# Patient Record
Sex: Male | Born: 1950 | Race: White | Hispanic: No | Marital: Married | Smoking: Never smoker
Health system: Southern US, Community
[De-identification: ages and names within clinical notes are randomized; demographics above are authoritative.]

## PROBLEM LIST (undated history)

## (undated) DIAGNOSIS — E785 Hyperlipidemia, unspecified: Secondary | ICD-10-CM

## (undated) DIAGNOSIS — H409 Unspecified glaucoma: Secondary | ICD-10-CM

## (undated) DIAGNOSIS — Z9189 Other specified personal risk factors, not elsewhere classified: Secondary | ICD-10-CM

## (undated) DIAGNOSIS — I10 Essential (primary) hypertension: Secondary | ICD-10-CM

## (undated) DIAGNOSIS — I2510 Atherosclerotic heart disease of native coronary artery without angina pectoris: Secondary | ICD-10-CM

## (undated) DIAGNOSIS — N189 Chronic kidney disease, unspecified: Secondary | ICD-10-CM

## (undated) DIAGNOSIS — C44301 Unspecified malignant neoplasm of skin of nose: Secondary | ICD-10-CM

## (undated) DIAGNOSIS — Z87442 Personal history of urinary calculi: Secondary | ICD-10-CM

## (undated) DIAGNOSIS — H269 Unspecified cataract: Secondary | ICD-10-CM

## (undated) DIAGNOSIS — N200 Calculus of kidney: Secondary | ICD-10-CM

## (undated) HISTORY — PX: MOHS SURGERY: SUR867

## (undated) HISTORY — DX: Essential (primary) hypertension: I10

## (undated) HISTORY — PX: OTHER SURGICAL HISTORY: SHX169

## (undated) HISTORY — DX: Calculus of kidney: N20.0

## (undated) HISTORY — DX: Chronic kidney disease, unspecified: N18.9

## (undated) HISTORY — PX: LITHOTRIPSY: SUR834

## (undated) HISTORY — DX: Unspecified glaucoma: H40.9

## (undated) HISTORY — DX: Unspecified malignant neoplasm of skin of nose: C44.301

## (undated) HISTORY — PX: TONSILLECTOMY: SHX5217

## (undated) HISTORY — DX: Atherosclerotic heart disease of native coronary artery without angina pectoris: I25.10

## (undated) HISTORY — PX: BASAL CELL CARCINOMA EXCISION: SHX1214

## (undated) HISTORY — DX: Hyperlipidemia, unspecified: E78.5

## (undated) HISTORY — DX: Unspecified cataract: H26.9

## (undated) HISTORY — DX: Other specified personal risk factors, not elsewhere classified: Z91.89

## (undated) HISTORY — PX: ANKLE SURGERY: SHX546

## (undated) HISTORY — DX: Personal history of urinary calculi: Z87.442

## (undated) HISTORY — PX: LAMINECTOMY: SHX219

---

## 1999-12-28 ENCOUNTER — Emergency Department (HOSPITAL_COMMUNITY): Admission: EM | Admit: 1999-12-28 | Discharge: 1999-12-28 | Payer: Self-pay | Admitting: Emergency Medicine

## 1999-12-28 ENCOUNTER — Encounter: Payer: Self-pay | Admitting: Emergency Medicine

## 2000-07-20 ENCOUNTER — Encounter: Admission: RE | Admit: 2000-07-20 | Discharge: 2000-07-20 | Payer: Self-pay | Admitting: Urology

## 2000-07-20 ENCOUNTER — Encounter: Payer: Self-pay | Admitting: Urology

## 2001-01-12 ENCOUNTER — Encounter: Admission: RE | Admit: 2001-01-12 | Discharge: 2001-01-12 | Payer: Self-pay | Admitting: Urology

## 2001-01-12 ENCOUNTER — Encounter: Payer: Self-pay | Admitting: Urology

## 2001-04-01 ENCOUNTER — Encounter: Payer: Self-pay | Admitting: Urology

## 2001-04-01 ENCOUNTER — Encounter: Admission: RE | Admit: 2001-04-01 | Discharge: 2001-04-01 | Payer: Self-pay | Admitting: Urology

## 2001-06-21 ENCOUNTER — Encounter: Admission: RE | Admit: 2001-06-21 | Discharge: 2001-06-21 | Payer: Self-pay | Admitting: Urology

## 2001-06-21 ENCOUNTER — Encounter: Payer: Self-pay | Admitting: Urology

## 2001-06-28 ENCOUNTER — Encounter: Payer: Self-pay | Admitting: Urology

## 2001-06-28 ENCOUNTER — Encounter: Admission: RE | Admit: 2001-06-28 | Discharge: 2001-06-28 | Payer: Self-pay | Admitting: Urology

## 2002-06-20 ENCOUNTER — Encounter: Admission: RE | Admit: 2002-06-20 | Discharge: 2002-09-18 | Payer: Self-pay | Admitting: Orthopedic Surgery

## 2002-09-19 ENCOUNTER — Encounter: Admission: RE | Admit: 2002-09-19 | Discharge: 2002-09-27 | Payer: Self-pay | Admitting: Orthopedic Surgery

## 2003-10-09 ENCOUNTER — Emergency Department (HOSPITAL_COMMUNITY): Admission: AD | Admit: 2003-10-09 | Discharge: 2003-10-09 | Payer: Self-pay | Admitting: Emergency Medicine

## 2003-11-07 ENCOUNTER — Emergency Department (HOSPITAL_COMMUNITY): Admission: EM | Admit: 2003-11-07 | Discharge: 2003-11-08 | Payer: Self-pay | Admitting: Emergency Medicine

## 2003-11-07 IMAGING — CT CT ABDOMEN W/O CM
1 of 2 series · 15 of 32 positions shown, 19 images · non-contrast
Comparison: none

CLINICAL DATA: kidney stones, left flank pain
CT ABDOMEN AND PELVIS WITHOUT CONTRAST
Multidetector helical CT imaging of the abdomen and pelvis performed using kidney stone protocol.  Neither oral nor intravenous contrast utilized for this indication.  Patient?s prior exams are at [REDACTED] and are unavailable for comparison at this time.  
CT ABDOMEN

[Series 3: — · axial · 0.76mm/px · z∈[+880,+1248]mm · 15 of 104 slices shown, 19 images]
[im 8/104  soft-tissue]
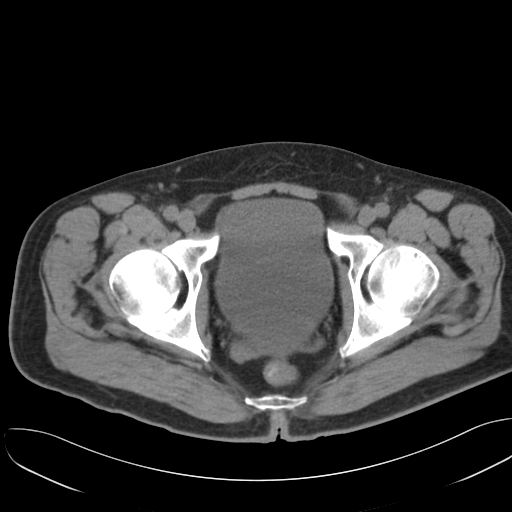
[im 8/104  bone]
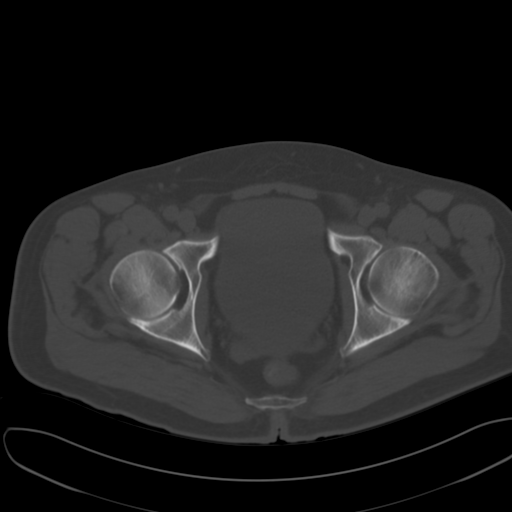
[im 15/104  soft-tissue]
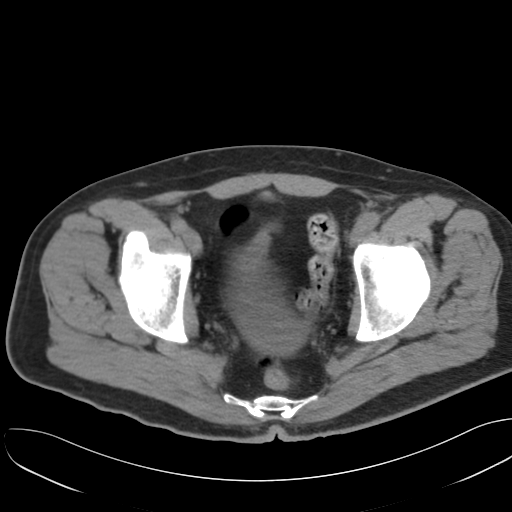
[im 23/104  soft-tissue]
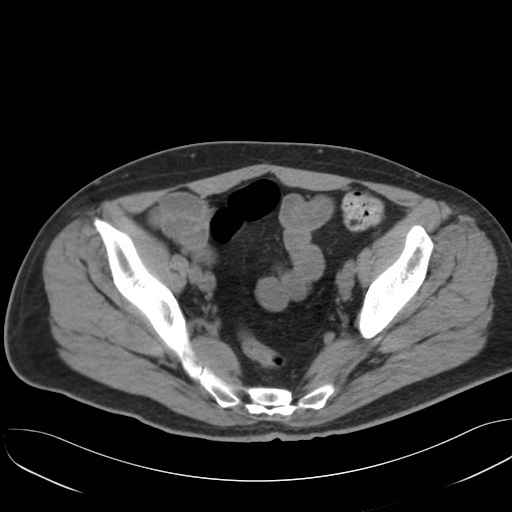
[im 30/104  soft-tissue]
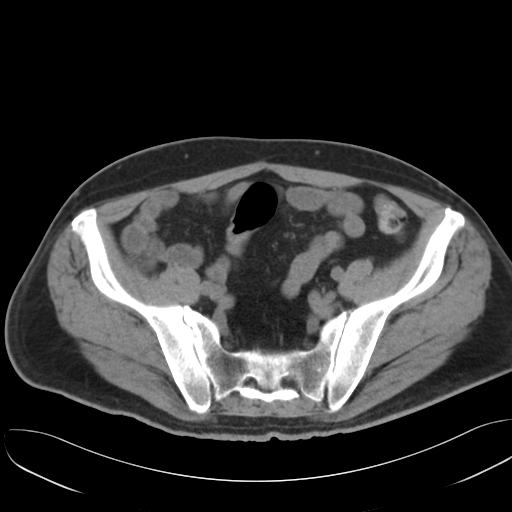
[im 37/104  soft-tissue]
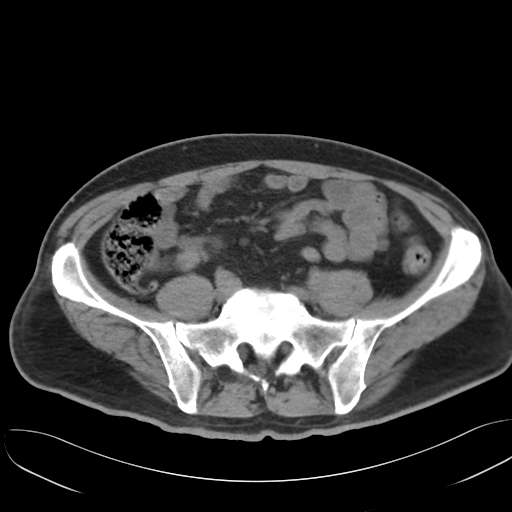
[im 45/104  soft-tissue]
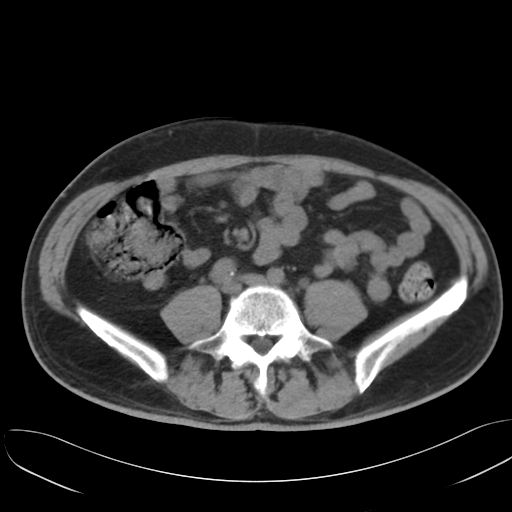
[im 52/104  soft-tissue]
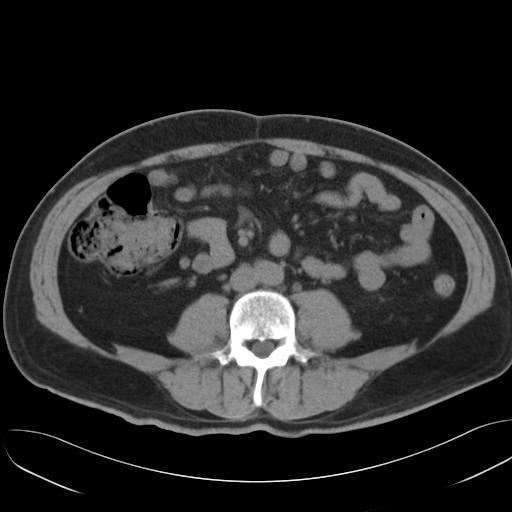
[im 59/104  soft-tissue]
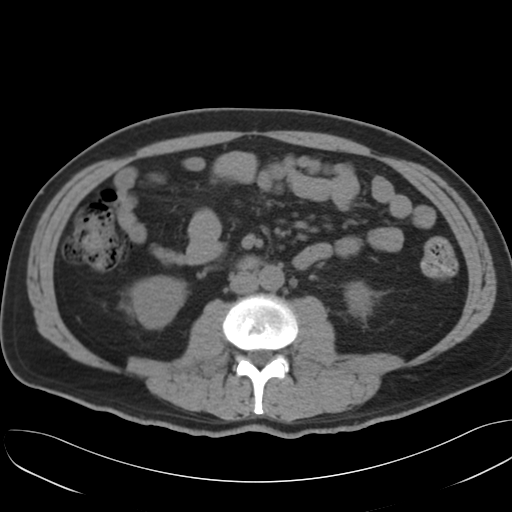
[im 67/104  soft-tissue]
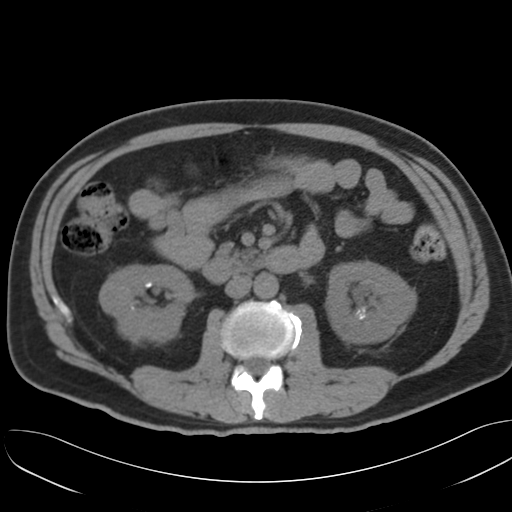
[im 67/104  bone]
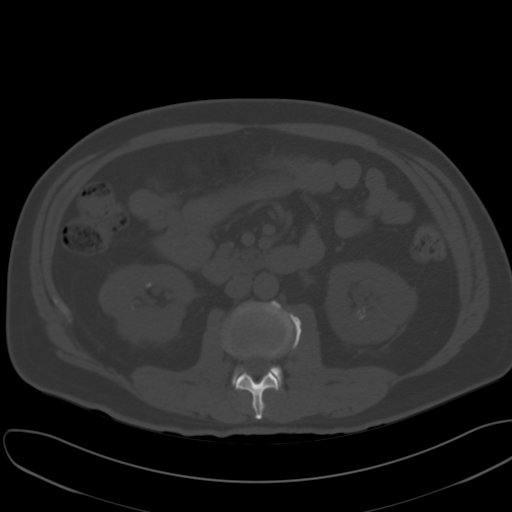
[im 74/104  soft-tissue]
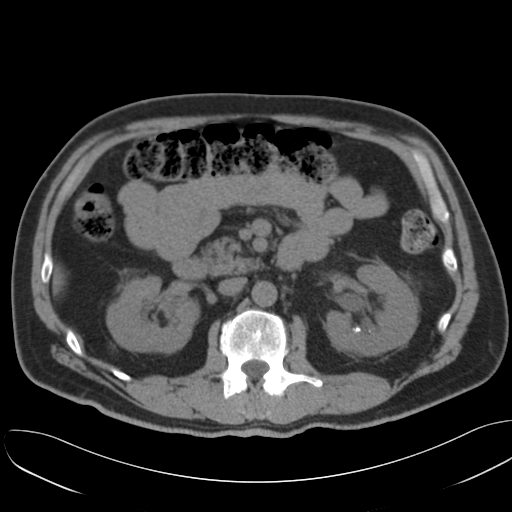
[im 81/104  soft-tissue]
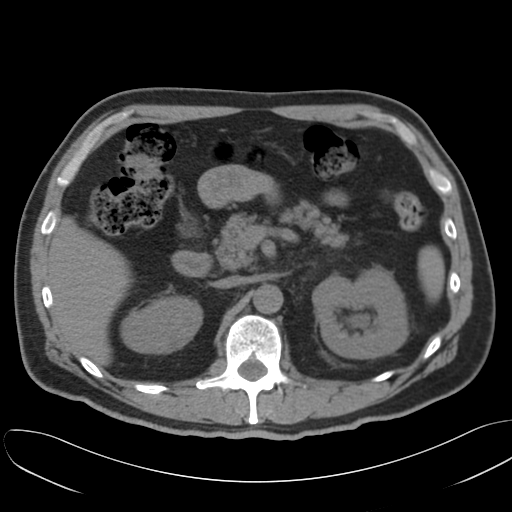
[im 89/104  soft-tissue]
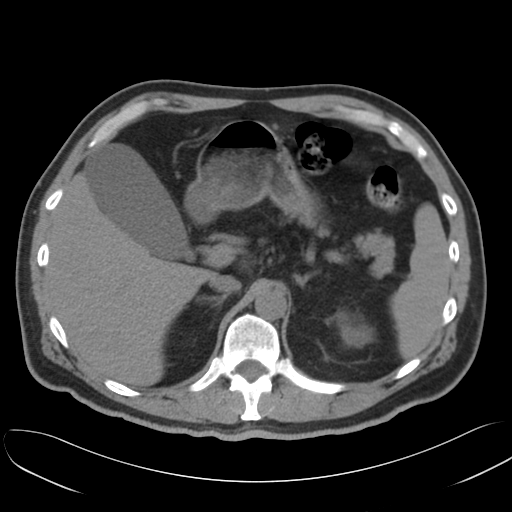
[im 89/104  lung]
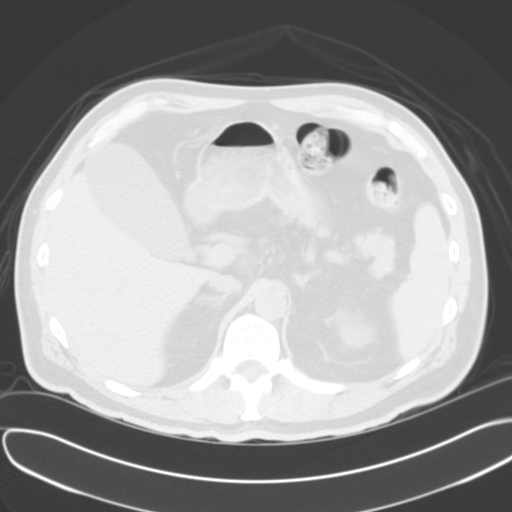
[im 92/104  lung]
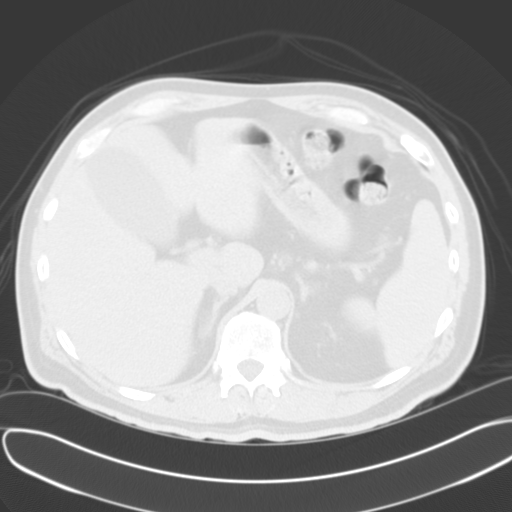
[im 96/104  soft-tissue]
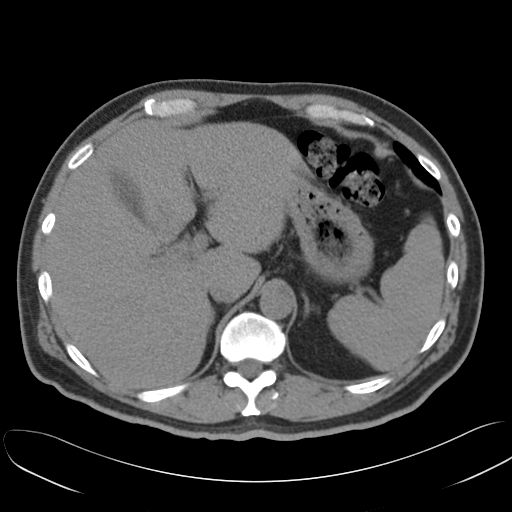
[im 96/104  lung]
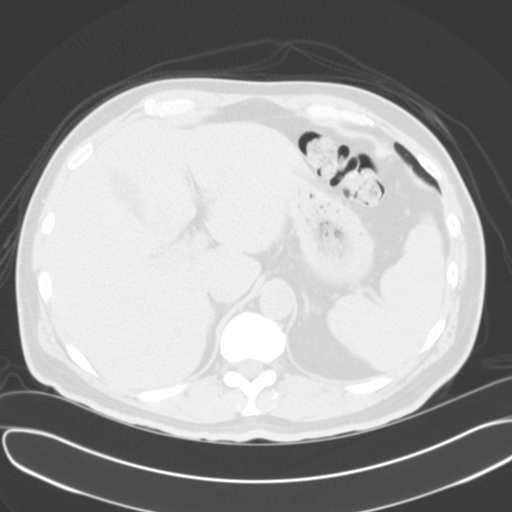
[im 100/104  lung]
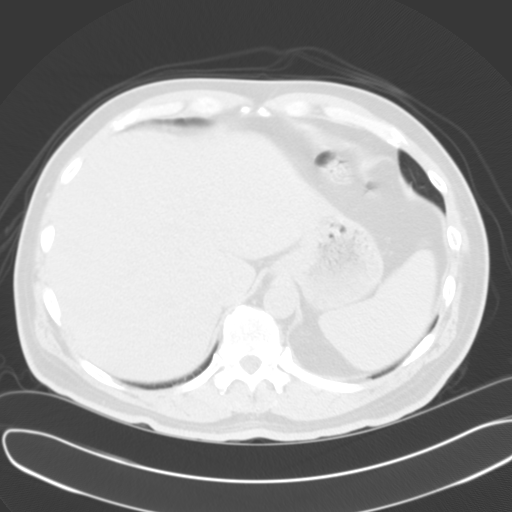

[15 of 32 positions shown; findings below may reference images not displayed]

FINDINGS: Bilateral renal calculi.  Mild left hydronephrosis and ureteral dilatation extending into pelvis.  No right hydronephrosis or ureteral dilatation seen.  Visualized bowel loops and remaining solid organs unremarkable for an exam lacking IV and oral contrast.  Mild perinephric stranding is seen at both kidneys slightly greater on the left.  
IMPRESSION
Numerous bilateral renal calculi.  Mild left hydronephrosis and hydroureter, see below.
CT PELVIS
5mm diameter calculi midleft ureter (image # 70) causing proximal hydronephrosis and ureteral dilatation.  Distal left ureter decompressed and no additional calcification is identified.  Right ureter is not dilated.  However just above the right ureterovesical junction, two small calcifications are seen which appear to be either in or immediately adjacent to the distal right ureter and nonobstructive distal right ureteral calculi not excluded.  Bladder wall distended and unremarkable.  No pelvic free fluid mass or adenopathy.  Prostate gland upper normal-sized.  Normal appendix.  
IMPRESSION
5mm diameter midleft ureteral calculus causing proximal hydronephrosis.
Question two tiny nonobstructive calculi in distal right ureter versus adjacent phleboliths.

## 2003-11-08 ENCOUNTER — Observation Stay (HOSPITAL_COMMUNITY): Admission: RE | Admit: 2003-11-08 | Discharge: 2003-11-09 | Payer: Self-pay | Admitting: Urology

## 2003-11-10 ENCOUNTER — Ambulatory Visit (HOSPITAL_COMMUNITY): Admission: EM | Admit: 2003-11-10 | Discharge: 2003-11-10 | Payer: Self-pay | Admitting: Emergency Medicine

## 2003-11-10 ENCOUNTER — Emergency Department (HOSPITAL_COMMUNITY): Admission: EM | Admit: 2003-11-10 | Discharge: 2003-11-10 | Payer: Self-pay | Admitting: Emergency Medicine

## 2003-11-10 IMAGING — CT CT PELVIS W/O CM
1 series · 15 of 32 positions shown, 19 images · non-contrast
Comparison: [DATE].

CLINICAL DATA: Left flank pain.
CT SCAN OF THE ABDOMEN AND PELVIS WITHOUT INTRAVENOUS OR ORAL CONTRAST UTILIZING RENAL STONE PROTOCOL

[Series 3: — · axial · 0.78mm/px · z∈[+802,+1194]mm · 15 of 109 slices shown, 19 images]
[im 7/109  soft-tissue]
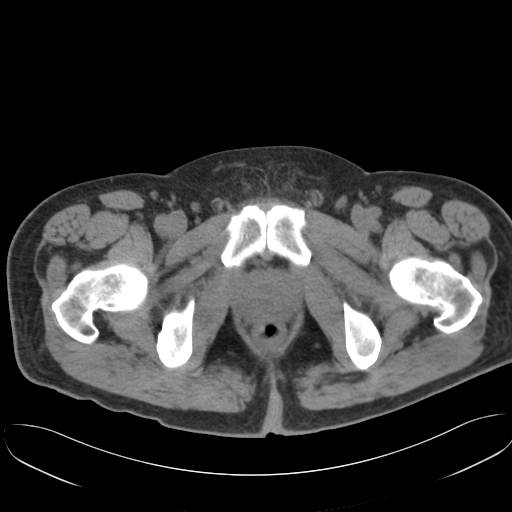
[im 7/109  bone]
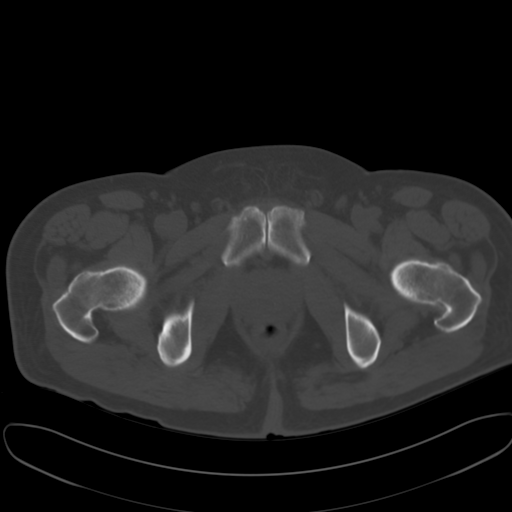
[im 14/109  soft-tissue]
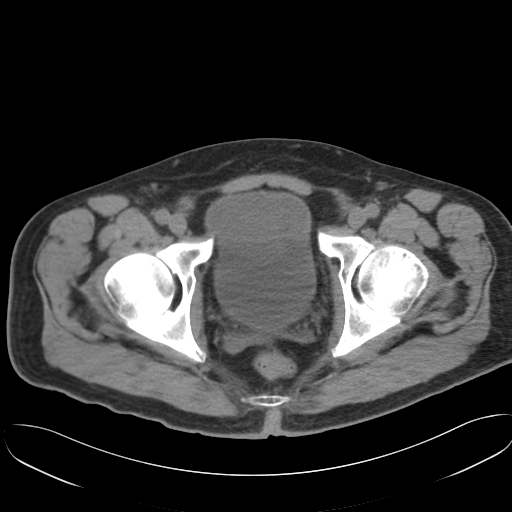
[im 21/109  soft-tissue]
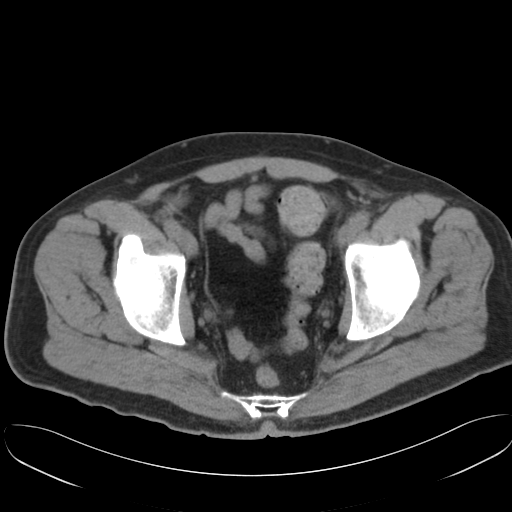
[im 32/109  soft-tissue]
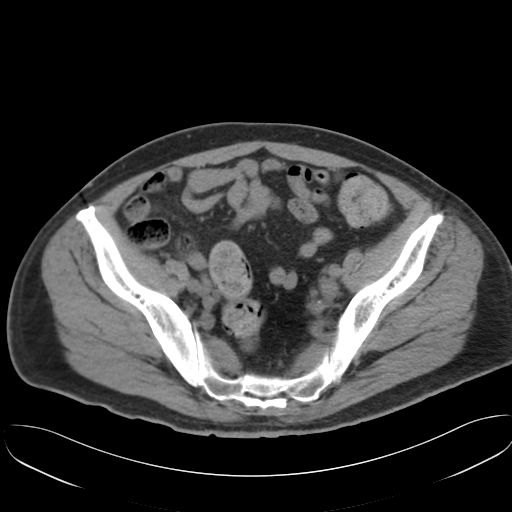
[im 39/109  soft-tissue]
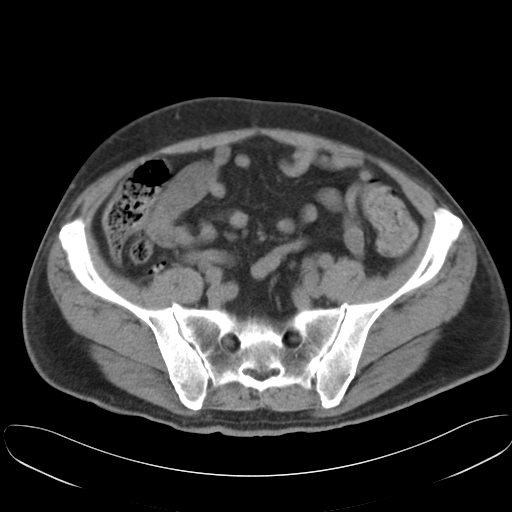
[im 46/109  soft-tissue]
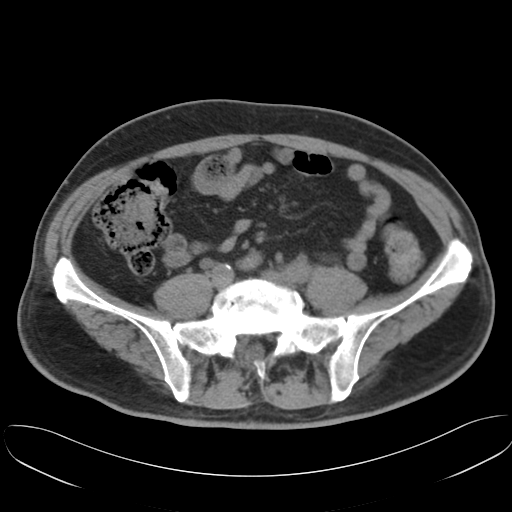
[im 56/109  soft-tissue]
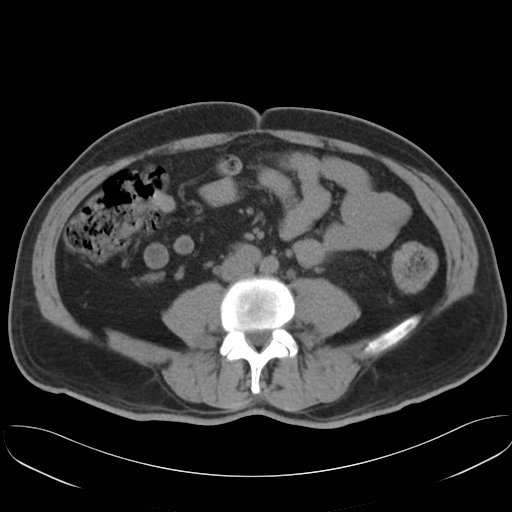
[im 63/109  soft-tissue]
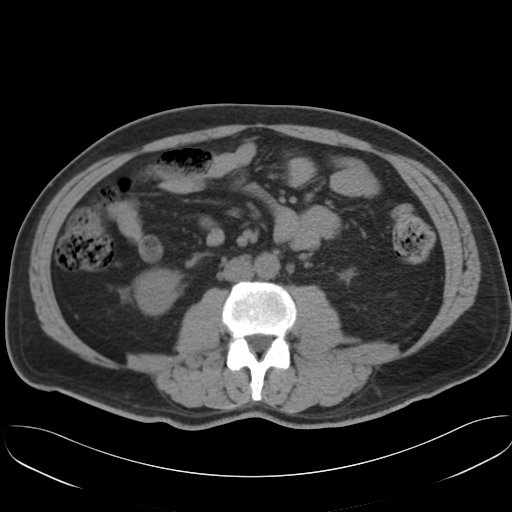
[im 70/109  soft-tissue]
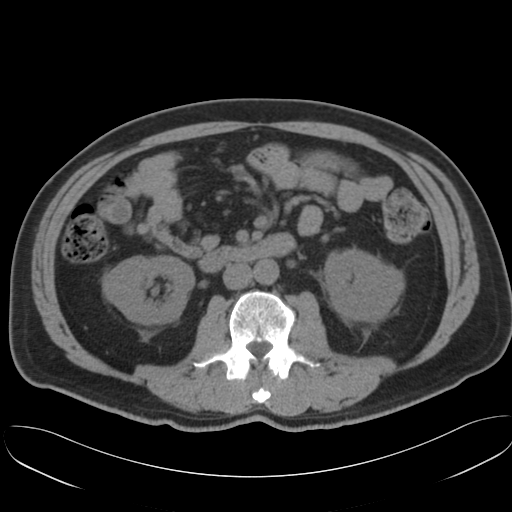
[im 70/109  bone]
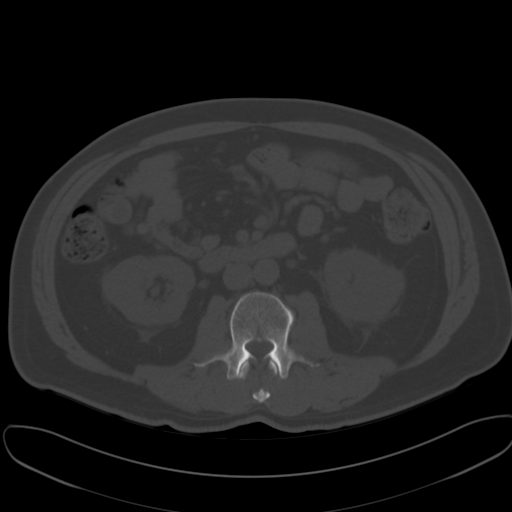
[im 77/109  soft-tissue]
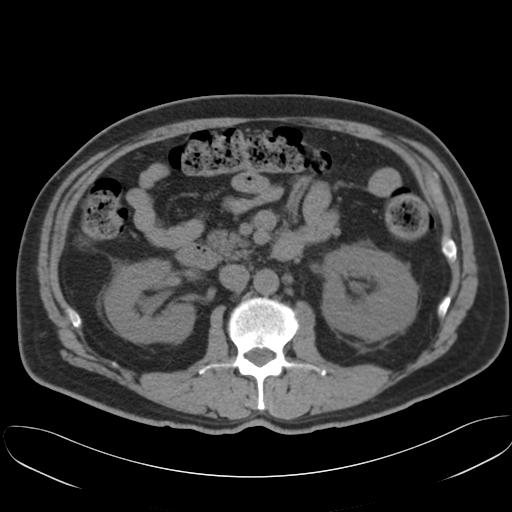
[im 88/109  soft-tissue]
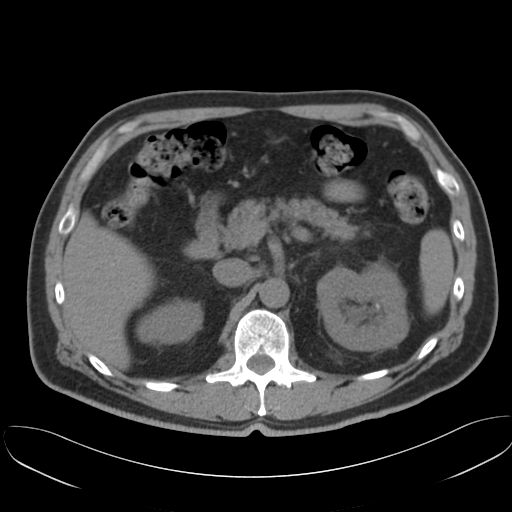
[im 95/109  soft-tissue]
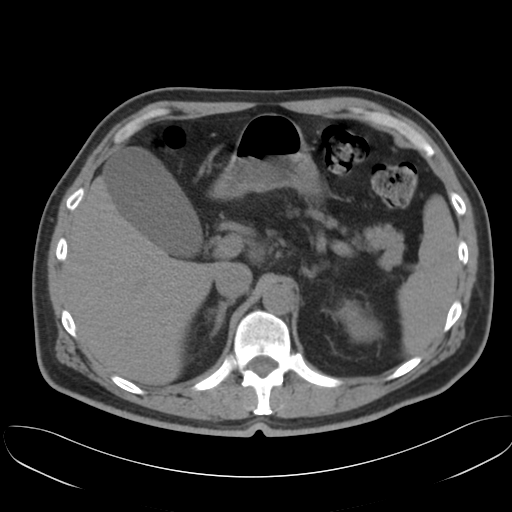
[im 95/109  lung]
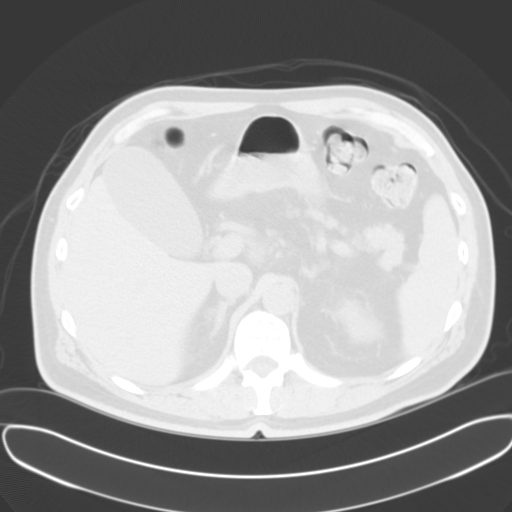
[im 98/109  lung]
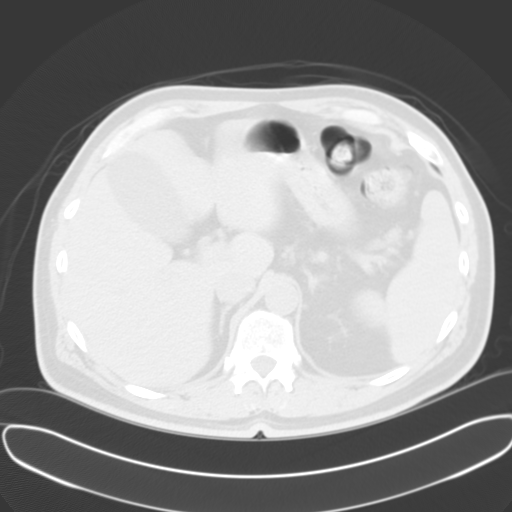
[im 102/109  soft-tissue]
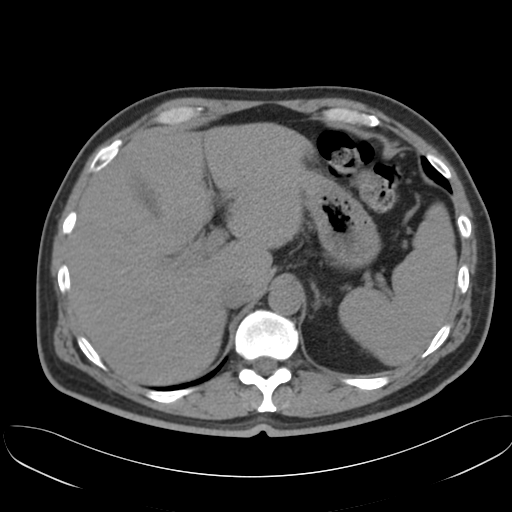
[im 102/109  lung]
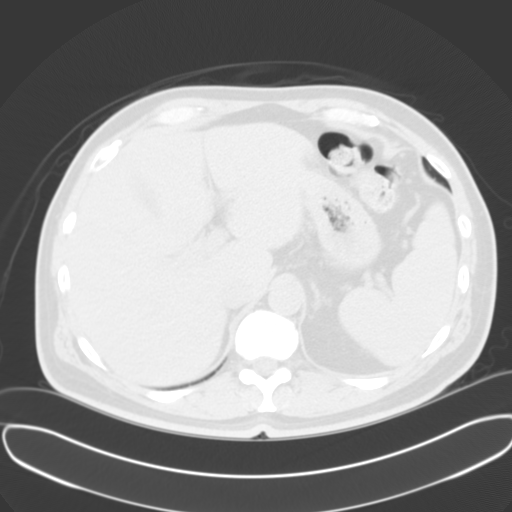
[im 105/109  lung]
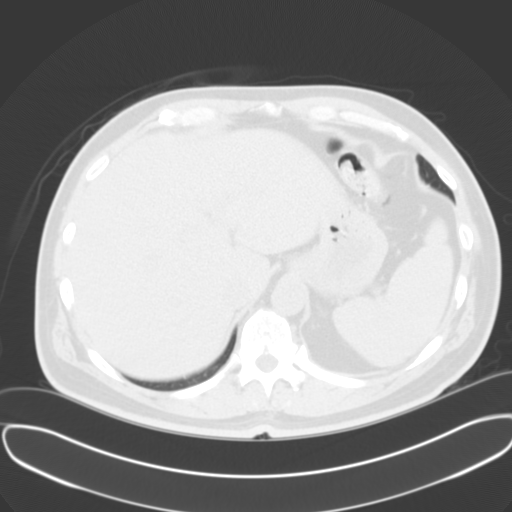

[15 of 32 positions shown; findings below may reference images not displayed]

FINDINGS: CT ABDOMEN WITHOUT CONTRAST 
Bilateral renal calculi.  Left-sided hydroureteronephrosis.  Please see below.  Unenhanced visualized portions of the liver, spleen, pancreas, and adrenal glands unremarkable.  Gallbladder is distended, but without CT evidence of inflammation.  Gallstones cannot be excluded by CT.  Mild degenerative changes thoracolumbar spine.  
IMPRESSION
Persistent left-sided hydroureteronephrosis.  Bilateral renal calculi noted.  Please see below.
CT PELVIS WITHOUT CONTRAST 
Mild atherosclerotic type changes in common iliac arteries.  Previously, the patient was noted to have a 5 x 5 mm distal left ureteral calculus at the level of the upper sacrum.  This has either passed or fragmented.  Now noted are two smaller left ureteral calculi located slightly distal within the distal left ureter measuring 2 x 3 mm and 4 x 3 mm.  Once again noted are distal right ureteral non-obstructing calculi.  Prostate gland is prominent in size.  Degenerative changes lumbar spine.  
IMPRESSION
Change in appearance of distal left ureteral calculi.  Previously a 5 x 5 mm calculus was noted.  This has either passed or fragmented, and now noted are two smaller distal left ureteral calculi measuring 2 x 3 mm and 3 x 4 mm.  Remainder of findings without contrast as noted above.

## 2003-11-17 ENCOUNTER — Emergency Department (HOSPITAL_COMMUNITY): Admission: EM | Admit: 2003-11-17 | Discharge: 2003-11-18 | Payer: Self-pay | Admitting: Emergency Medicine

## 2006-06-16 ENCOUNTER — Emergency Department (HOSPITAL_COMMUNITY): Admission: EM | Admit: 2006-06-16 | Discharge: 2006-06-16 | Payer: Self-pay | Admitting: Emergency Medicine

## 2006-06-16 IMAGING — CT CT ABDOMEN W/O CM
1 series · 15 of 32 positions shown, 19 images · IV contrast (agent unspecified)
Comparison: [DATE].

CLINICAL DATA: Right flank pain.  Question kidney stone.  
ABDOMEN CT WITHOUT CONTRAST:
TECHNIQUE: Multidetector CT imaging of the abdomen was performed following the standard protocol without IV contrast.
TECHNIQUE: Multidetector CT imaging of the pelvis was performed following the standard protocol without IV contrast.

[Series 2: stone_wo 5.0 b40f st · axial · 0.76mm/px · z∈[-479,-63]mm · 15 of 116 slices shown, 19 images]
[im 8/116  soft-tissue]
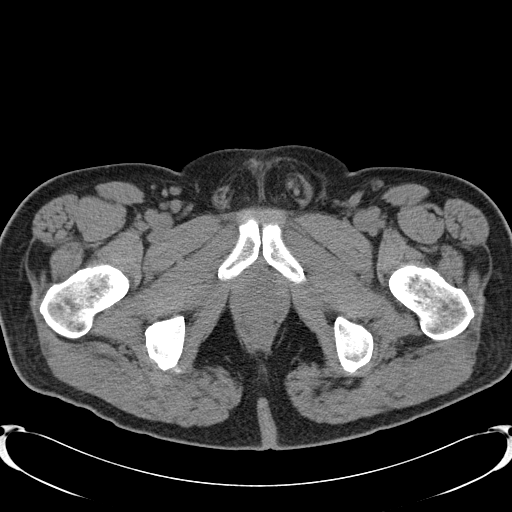
[im 8/116  bone]
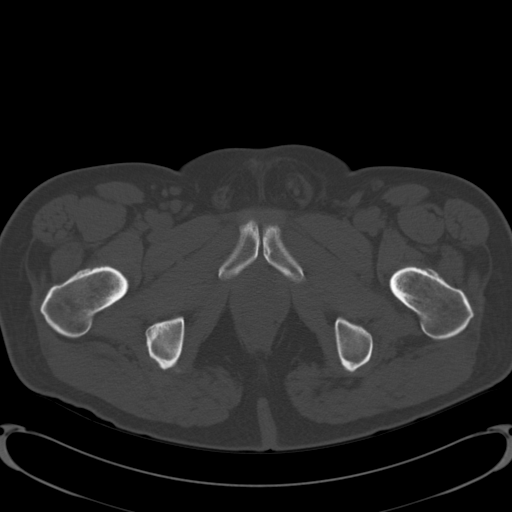
[im 15/116  soft-tissue]
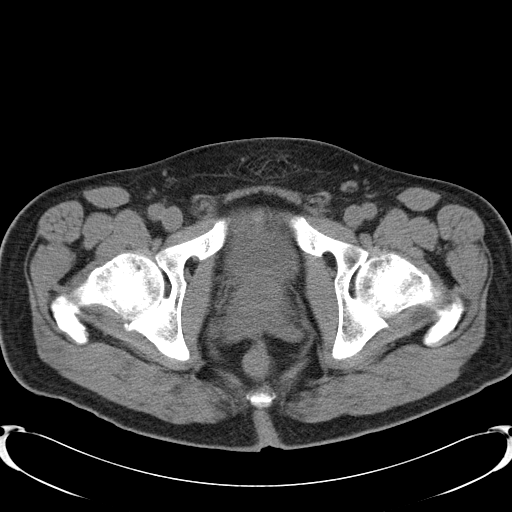
[im 23/116  soft-tissue]
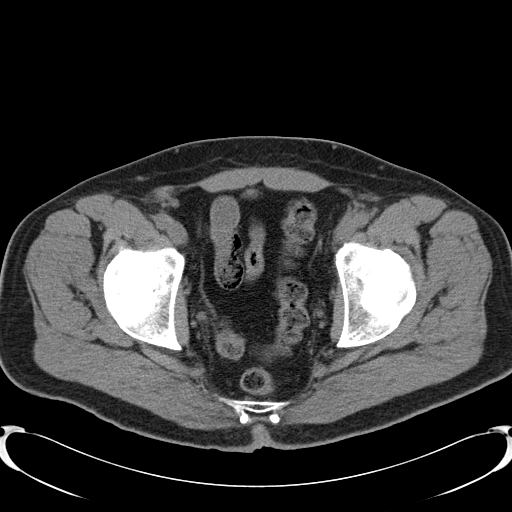
[im 34/116  soft-tissue]
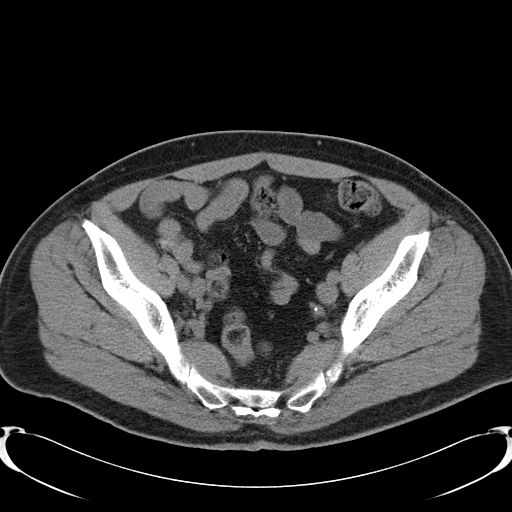
[im 41/116  soft-tissue]
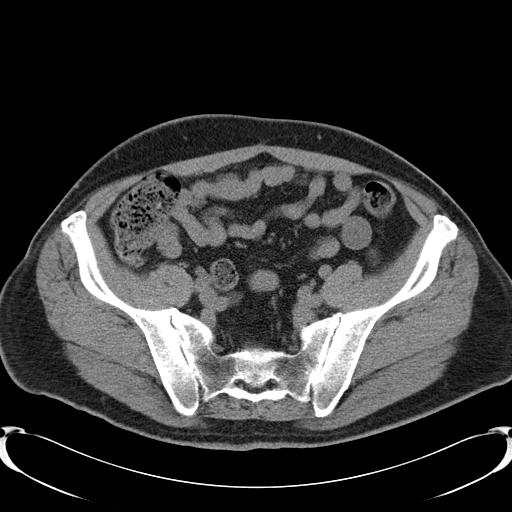
[im 49/116  soft-tissue]
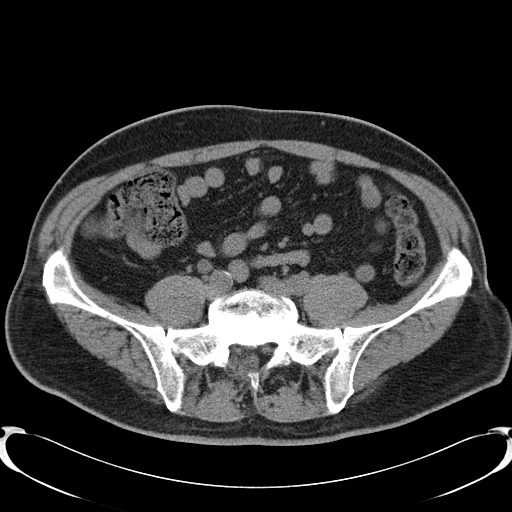
[im 60/116  soft-tissue]
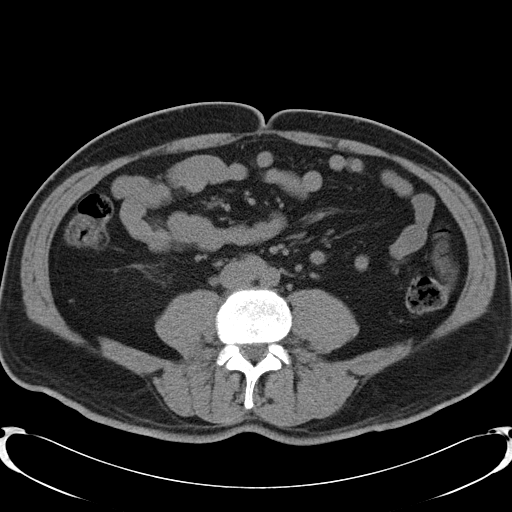
[im 67/116  soft-tissue]
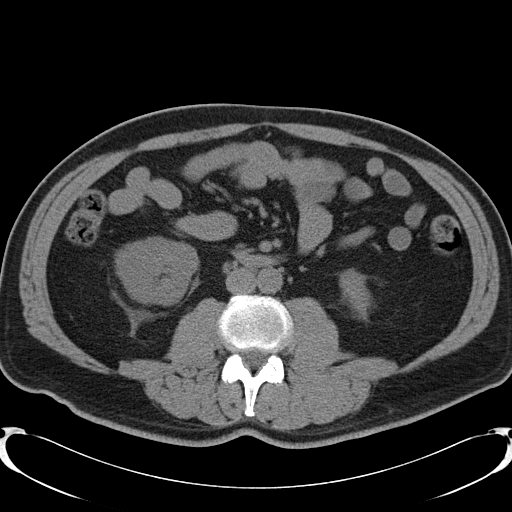
[im 75/116  soft-tissue]
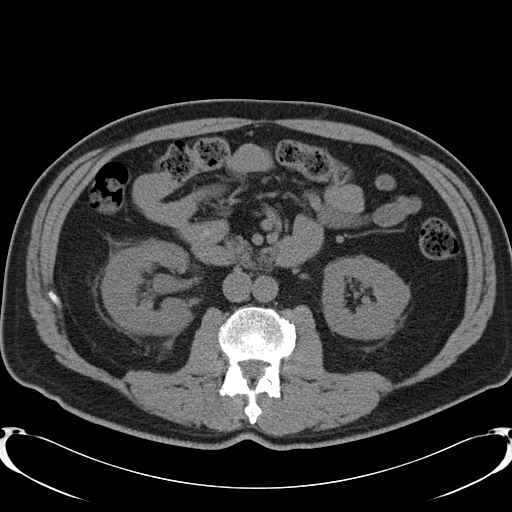
[im 75/116  bone]
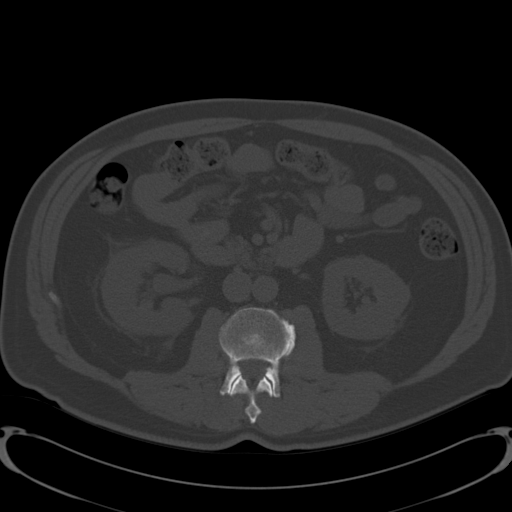
[im 82/116  soft-tissue]
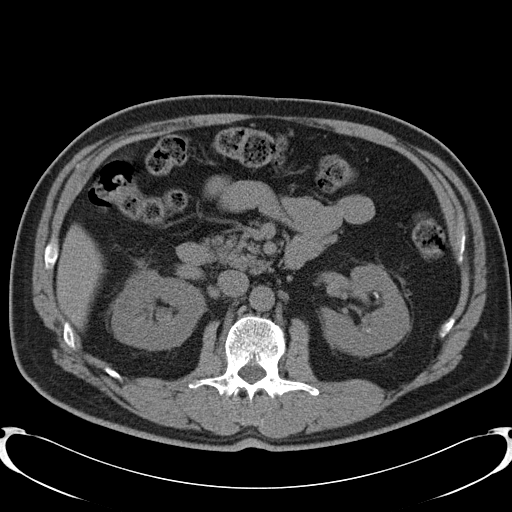
[im 93/116  soft-tissue]
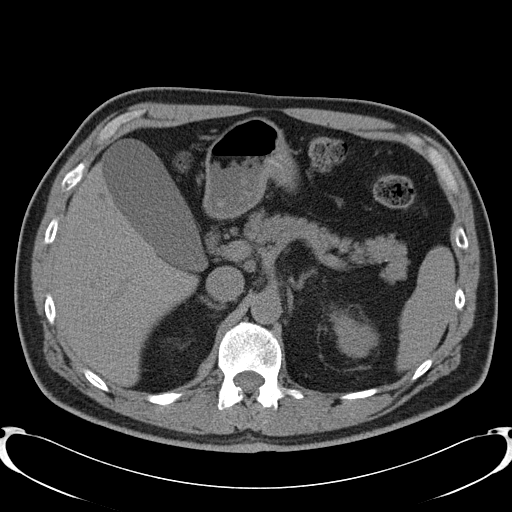
[im 101/116  soft-tissue]
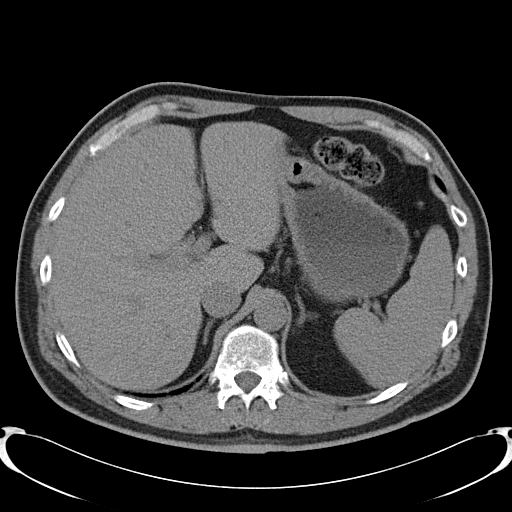
[im 101/116  lung]
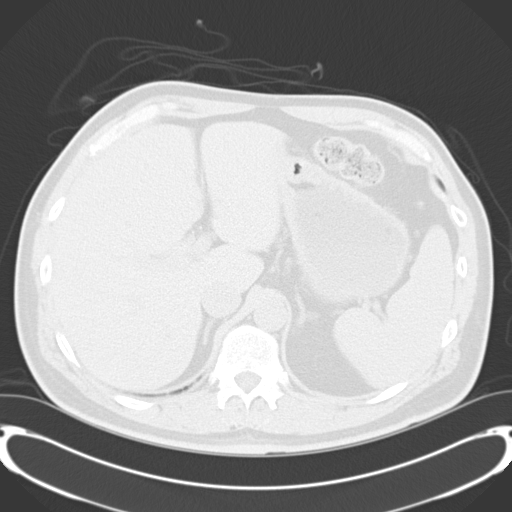
[im 104/116  lung]
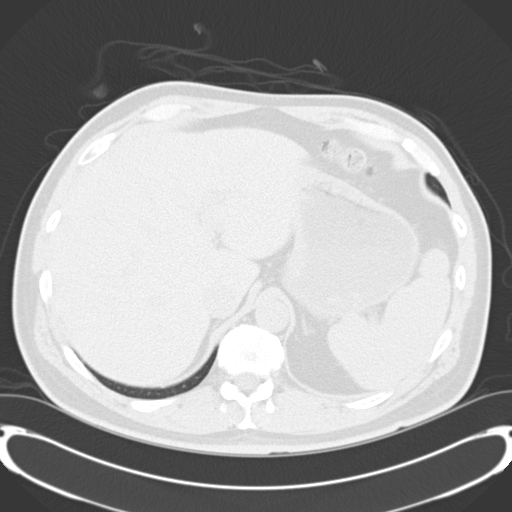
[im 108/116  soft-tissue]
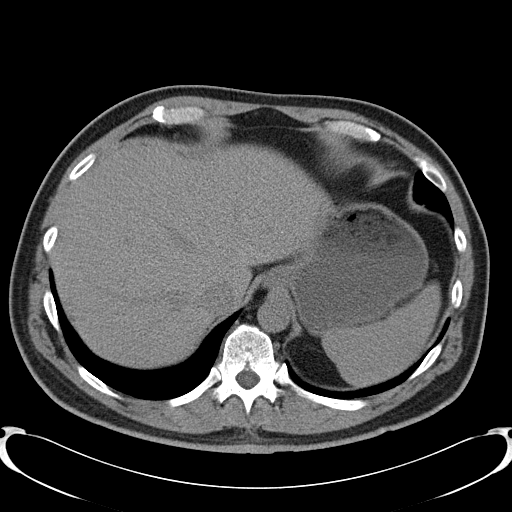
[im 108/116  lung]
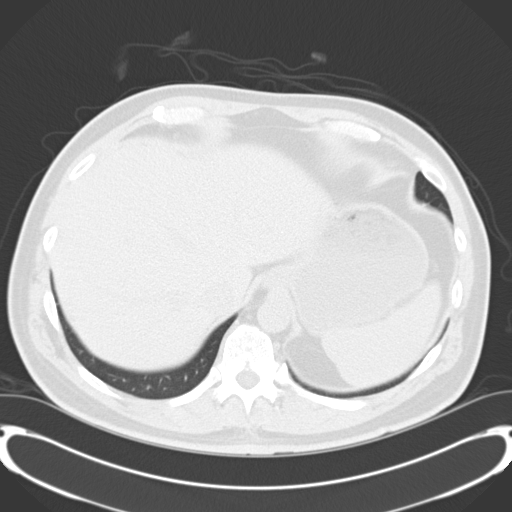
[im 112/116  lung]
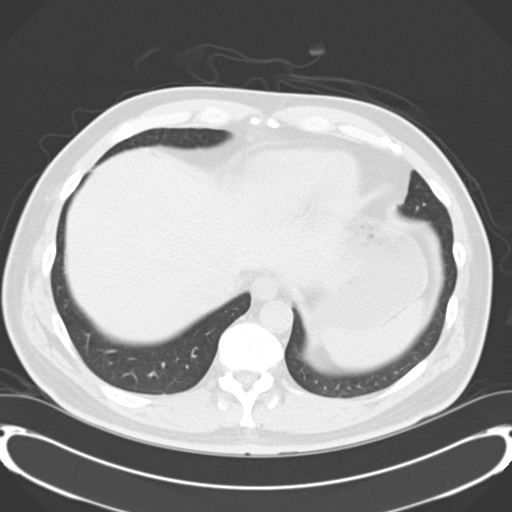

[15 of 32 positions shown; findings below may reference images not displayed]

FINDINGS: Lung bases clear.  Dilated gallbladder without surrounding inflammation.  Unenhanced visualized portions of the liver, spleen, pancreas, and adrenal glands unremarkable.  Multiple bilateral renal calculi.  Fullness of the right collecting system secondary to distal ureteral calculi as discussed below.  No gross renal mass although evaluation is somewhat limited without contrast.  Mild degenerative changes of the lumbar spine.  No abdominal aortic aneurysm.  Limited evaluation of bowel without gross abnormality.
IMPRESSION: Multiple bilateral renal calculi with fullness of the right collecting system.  Please see below.  
PELVIS CT WITHOUT CONTRAST:
FINDINGS: Dilated right ureter to the level of the right ureterovesical junction where there is a 4.4 mm obstructing calculus.  The prostate gland is slightly prominent with scattered calcification.  Mild atherosclerotic type changes proximal common iliac arteries.
IMPRESSION: 4.4 mm distal right ureteral obstructing calculus.  This is located just above the ureterovesical junction.

## 2008-08-30 ENCOUNTER — Ambulatory Visit: Payer: Self-pay | Admitting: Endocrinology

## 2008-08-30 DIAGNOSIS — Z87442 Personal history of urinary calculi: Secondary | ICD-10-CM | POA: Insufficient documentation

## 2008-08-30 DIAGNOSIS — M129 Arthropathy, unspecified: Secondary | ICD-10-CM | POA: Insufficient documentation

## 2008-08-30 DIAGNOSIS — Z9189 Other specified personal risk factors, not elsewhere classified: Secondary | ICD-10-CM

## 2008-08-30 DIAGNOSIS — K219 Gastro-esophageal reflux disease without esophagitis: Secondary | ICD-10-CM | POA: Insufficient documentation

## 2008-08-30 DIAGNOSIS — R32 Unspecified urinary incontinence: Secondary | ICD-10-CM | POA: Insufficient documentation

## 2008-08-30 HISTORY — DX: Other specified personal risk factors, not elsewhere classified: Z91.89

## 2008-08-30 HISTORY — DX: Personal history of urinary calculi: Z87.442

## 2008-09-13 ENCOUNTER — Ambulatory Visit: Payer: Self-pay | Admitting: Endocrinology

## 2008-10-04 ENCOUNTER — Encounter: Payer: Self-pay | Admitting: Endocrinology

## 2008-10-13 ENCOUNTER — Ambulatory Visit: Payer: Self-pay | Admitting: Endocrinology

## 2008-11-24 ENCOUNTER — Ambulatory Visit: Payer: Self-pay | Admitting: Endocrinology

## 2008-11-24 LAB — CONVERTED CEMR LAB: Hgb A1c MFr Bld: 8.9 % — ABNORMAL HIGH (ref 4.6–6.5)

## 2008-12-01 ENCOUNTER — Telehealth (INDEPENDENT_AMBULATORY_CARE_PROVIDER_SITE_OTHER): Payer: Self-pay | Admitting: *Deleted

## 2008-12-26 ENCOUNTER — Ambulatory Visit: Payer: Self-pay | Admitting: Endocrinology

## 2009-02-07 ENCOUNTER — Ambulatory Visit: Payer: Self-pay | Admitting: Endocrinology

## 2009-03-27 ENCOUNTER — Ambulatory Visit: Payer: Self-pay | Admitting: Internal Medicine

## 2009-03-27 DIAGNOSIS — I10 Essential (primary) hypertension: Secondary | ICD-10-CM | POA: Insufficient documentation

## 2009-04-25 ENCOUNTER — Ambulatory Visit: Payer: Self-pay | Admitting: Internal Medicine

## 2009-04-25 LAB — CONVERTED CEMR LAB
ALT: 24 units/L (ref 0–53)
AST: 21 units/L (ref 0–37)
Albumin: 3.9 g/dL (ref 3.5–5.2)
Alkaline Phosphatase: 46 units/L (ref 39–117)
Bilirubin, Direct: 0 mg/dL (ref 0.0–0.3)
Cholesterol: 138 mg/dL (ref 0–200)
HDL: 28.9 mg/dL — ABNORMAL LOW (ref >39.00)
Hgb A1c MFr Bld: 7.8 % — ABNORMAL HIGH (ref 4.6–6.5)
LDL Cholesterol: 91 mg/dL (ref 0–99)
Total Bilirubin: 0.6 mg/dL (ref 0.3–1.2)
Total CHOL/HDL Ratio: 5
Total Protein: 6.7 g/dL (ref 6.0–8.3)
Triglycerides: 90 mg/dL (ref 0.0–149.0)
VLDL: 18 mg/dL (ref 0.0–40.0)

## 2009-05-02 ENCOUNTER — Ambulatory Visit: Payer: Self-pay | Admitting: Internal Medicine

## 2009-05-02 DIAGNOSIS — E786 Lipoprotein deficiency: Secondary | ICD-10-CM

## 2009-05-02 DIAGNOSIS — E785 Hyperlipidemia, unspecified: Secondary | ICD-10-CM | POA: Insufficient documentation

## 2009-05-30 ENCOUNTER — Telehealth: Payer: Self-pay | Admitting: Internal Medicine

## 2009-05-30 ENCOUNTER — Ambulatory Visit: Payer: Self-pay | Admitting: Internal Medicine

## 2009-06-19 ENCOUNTER — Ambulatory Visit: Payer: Self-pay | Admitting: Internal Medicine

## 2009-06-20 ENCOUNTER — Telehealth: Payer: Self-pay | Admitting: Internal Medicine

## 2009-07-03 ENCOUNTER — Ambulatory Visit: Payer: Self-pay | Admitting: Internal Medicine

## 2009-07-03 DIAGNOSIS — M7750 Other enthesopathy of unspecified foot: Secondary | ICD-10-CM | POA: Insufficient documentation

## 2009-07-03 LAB — CONVERTED CEMR LAB
BUN: 12 mg/dL (ref 6–23)
CO2: 30 meq/L (ref 19–32)
Calcium: 9.7 mg/dL (ref 8.4–10.5)
Chloride: 103 meq/L (ref 96–112)
Creatinine, Ser: 0.7 mg/dL (ref 0.4–1.5)
GFR calc non Af Amer: 122.41 mL/min (ref >60)
Glucose, Bld: 79 mg/dL (ref 70–99)
Hgb A1c MFr Bld: 8 % — ABNORMAL HIGH (ref 4.6–6.5)
Potassium: 4.3 meq/L (ref 3.5–5.1)
Sodium: 140 meq/L (ref 135–145)

## 2009-07-17 ENCOUNTER — Telehealth: Payer: Self-pay | Admitting: Internal Medicine

## 2009-07-18 ENCOUNTER — Telehealth: Payer: Self-pay | Admitting: Internal Medicine

## 2009-07-30 ENCOUNTER — Encounter: Admission: RE | Admit: 2009-07-30 | Discharge: 2009-08-02 | Payer: Self-pay | Admitting: Orthopedic Surgery

## 2009-08-06 ENCOUNTER — Encounter: Admission: RE | Admit: 2009-08-06 | Discharge: 2009-11-04 | Payer: Self-pay | Admitting: Orthopedic Surgery

## 2009-08-17 ENCOUNTER — Telehealth: Payer: Self-pay | Admitting: Internal Medicine

## 2009-09-04 ENCOUNTER — Ambulatory Visit: Payer: Self-pay | Admitting: Internal Medicine

## 2009-09-04 DIAGNOSIS — M19049 Primary osteoarthritis, unspecified hand: Secondary | ICD-10-CM | POA: Insufficient documentation

## 2009-09-04 LAB — CONVERTED CEMR LAB
BUN: 15 mg/dL (ref 6–23)
CO2: 30 meq/L (ref 19–32)
Calcium: 9.6 mg/dL (ref 8.4–10.5)
Chloride: 104 meq/L (ref 96–112)
Creatinine, Ser: 0.8 mg/dL (ref 0.4–1.5)
GFR calc non Af Amer: 104.87 mL/min (ref >60)
Glucose, Bld: 113 mg/dL — ABNORMAL HIGH (ref 70–99)
Hgb A1c MFr Bld: 8.1 % — ABNORMAL HIGH (ref 4.6–6.5)
Potassium: 4.6 meq/L (ref 3.5–5.1)
Sodium: 139 meq/L (ref 135–145)

## 2009-09-05 ENCOUNTER — Telehealth: Payer: Self-pay | Admitting: Internal Medicine

## 2009-11-08 ENCOUNTER — Encounter: Payer: Self-pay | Admitting: Internal Medicine

## 2009-11-16 ENCOUNTER — Telehealth: Payer: Self-pay | Admitting: Internal Medicine

## 2009-12-03 ENCOUNTER — Ambulatory Visit: Payer: Self-pay | Admitting: Internal Medicine

## 2009-12-03 DIAGNOSIS — M549 Dorsalgia, unspecified: Secondary | ICD-10-CM | POA: Insufficient documentation

## 2009-12-03 LAB — CONVERTED CEMR LAB
BUN: 13 mg/dL (ref 6–23)
CO2: 33 meq/L — ABNORMAL HIGH (ref 19–32)
Calcium: 9.4 mg/dL (ref 8.4–10.5)
Chloride: 104 meq/L (ref 96–112)
Creatinine, Ser: 0.8 mg/dL (ref 0.4–1.5)
GFR calc non Af Amer: 104.78 mL/min (ref >60)
Glucose, Bld: 138 mg/dL — ABNORMAL HIGH (ref 70–99)
Hgb A1c MFr Bld: 7.9 % — ABNORMAL HIGH (ref 4.6–6.5)
Potassium: 4.9 meq/L (ref 3.5–5.1)
Sodium: 142 meq/L (ref 135–145)

## 2009-12-06 ENCOUNTER — Encounter: Payer: Self-pay | Admitting: Internal Medicine

## 2009-12-11 ENCOUNTER — Telehealth: Payer: Self-pay | Admitting: Internal Medicine

## 2009-12-20 ENCOUNTER — Encounter: Payer: Self-pay | Admitting: Internal Medicine

## 2009-12-28 ENCOUNTER — Telehealth: Payer: Self-pay | Admitting: Endocrinology

## 2010-01-21 ENCOUNTER — Telehealth: Payer: Self-pay | Admitting: Internal Medicine

## 2010-01-22 ENCOUNTER — Encounter: Payer: Self-pay | Admitting: Internal Medicine

## 2010-02-15 ENCOUNTER — Ambulatory Visit: Payer: Self-pay | Admitting: Internal Medicine

## 2010-02-15 LAB — CONVERTED CEMR LAB
BUN: 18 mg/dL (ref 6–23)
CO2: 31 meq/L (ref 19–32)
Calcium: 9.6 mg/dL (ref 8.4–10.5)
Chloride: 106 meq/L (ref 96–112)
Cholesterol: 174 mg/dL (ref 0–200)
Creatinine, Ser: 0.8 mg/dL (ref 0.4–1.5)
Direct LDL: 111.7 mg/dL
GFR calc non Af Amer: 101.77 mL/min (ref >60)
Glucose, Bld: 109 mg/dL — ABNORMAL HIGH (ref 70–99)
HDL: 32.6 mg/dL — ABNORMAL LOW (ref >39.00)
Hgb A1c MFr Bld: 8.1 % — ABNORMAL HIGH (ref 4.6–6.5)
Potassium: 4.7 meq/L (ref 3.5–5.1)
Sodium: 139 meq/L (ref 135–145)

## 2010-03-22 ENCOUNTER — Telehealth: Payer: Self-pay | Admitting: Internal Medicine

## 2010-04-29 ENCOUNTER — Telehealth: Payer: Self-pay | Admitting: *Deleted

## 2010-05-17 ENCOUNTER — Encounter: Payer: Self-pay | Admitting: Internal Medicine

## 2010-05-17 ENCOUNTER — Ambulatory Visit: Payer: Self-pay | Admitting: Internal Medicine

## 2010-05-17 DIAGNOSIS — H6120 Impacted cerumen, unspecified ear: Secondary | ICD-10-CM | POA: Insufficient documentation

## 2010-05-17 DIAGNOSIS — R0602 Shortness of breath: Secondary | ICD-10-CM | POA: Insufficient documentation

## 2010-05-17 LAB — CONVERTED CEMR LAB
BUN: 16 mg/dL (ref 6–23)
CO2: 26 meq/L (ref 19–32)
Calcium: 9 mg/dL (ref 8.4–10.5)
Chloride: 105 meq/L (ref 96–112)
Cholesterol: 168 mg/dL (ref 0–200)
Creatinine, Ser: 0.8 mg/dL (ref 0.4–1.5)
Direct LDL: 98.9 mg/dL
GFR calc non Af Amer: 107.72 mL/min (ref >60)
Glucose, Bld: 134 mg/dL — ABNORMAL HIGH (ref 70–99)
HDL: 33.6 mg/dL — ABNORMAL LOW (ref >39.00)
Hgb A1c MFr Bld: 8.9 % — ABNORMAL HIGH (ref 4.6–6.5)
Potassium: 4.3 meq/L (ref 3.5–5.1)
Sodium: 138 meq/L (ref 135–145)

## 2010-05-28 ENCOUNTER — Telehealth (INDEPENDENT_AMBULATORY_CARE_PROVIDER_SITE_OTHER): Payer: Self-pay | Admitting: *Deleted

## 2010-05-28 ENCOUNTER — Telehealth: Payer: Self-pay | Admitting: Internal Medicine

## 2010-05-29 ENCOUNTER — Ambulatory Visit: Payer: Self-pay | Admitting: Cardiovascular Disease

## 2010-05-29 ENCOUNTER — Encounter (HOSPITAL_COMMUNITY)
Admission: RE | Admit: 2010-05-29 | Discharge: 2010-08-03 | Payer: Self-pay | Source: Home / Self Care | Attending: Internal Medicine | Admitting: Internal Medicine

## 2010-05-29 ENCOUNTER — Ambulatory Visit: Payer: Self-pay | Admitting: Internal Medicine

## 2010-05-29 ENCOUNTER — Ambulatory Visit: Payer: Self-pay

## 2010-05-29 ENCOUNTER — Encounter: Payer: Self-pay | Admitting: Cardiovascular Disease

## 2010-06-05 ENCOUNTER — Telehealth: Payer: Self-pay | Admitting: Internal Medicine

## 2010-06-24 ENCOUNTER — Telehealth: Payer: Self-pay | Admitting: Internal Medicine

## 2010-07-22 ENCOUNTER — Telehealth: Payer: Self-pay | Admitting: Internal Medicine

## 2010-08-26 ENCOUNTER — Other Ambulatory Visit: Payer: Self-pay | Admitting: Internal Medicine

## 2010-08-26 ENCOUNTER — Ambulatory Visit
Admission: RE | Admit: 2010-08-26 | Discharge: 2010-08-26 | Payer: Self-pay | Source: Home / Self Care | Attending: Internal Medicine | Admitting: Internal Medicine

## 2010-08-26 DIAGNOSIS — N529 Male erectile dysfunction, unspecified: Secondary | ICD-10-CM | POA: Insufficient documentation

## 2010-08-26 LAB — CBC WITH DIFFERENTIAL/PLATELET
Basophils Absolute: 0 10*3/uL (ref 0.0–0.1)
Basophils Relative: 0.6 % (ref 0.0–3.0)
Eosinophils Absolute: 0.2 10*3/uL (ref 0.0–0.7)
Eosinophils Relative: 2.4 % (ref 0.0–5.0)
HCT: 43.1 % (ref 39.0–52.0)
Hemoglobin: 14.8 g/dL (ref 13.0–17.0)
Lymphocytes Relative: 25.5 % (ref 12.0–46.0)
Lymphs Abs: 2 10*3/uL (ref 0.7–4.0)
MCHC: 34.3 g/dL (ref 30.0–36.0)
MCV: 91.2 fl (ref 78.0–100.0)
Monocytes Absolute: 0.7 10*3/uL (ref 0.1–1.0)
Monocytes Relative: 8.8 % (ref 3.0–12.0)
Neutro Abs: 4.9 10*3/uL (ref 1.4–7.7)
Neutrophils Relative %: 62.7 % (ref 43.0–77.0)
Platelets: 229 10*3/uL (ref 150.0–400.0)
RBC: 4.73 Mil/uL (ref 4.22–5.81)
RDW: 14 % (ref 11.5–14.6)
WBC: 7.8 10*3/uL (ref 4.5–10.5)

## 2010-08-26 LAB — TESTOSTERONE: Testosterone: 360.25 ng/dL (ref 350.00–890.00)

## 2010-08-26 LAB — MICROALBUMIN / CREATININE URINE RATIO
Creatinine,U: 140.8 mg/dL
Microalb Creat Ratio: 1 mg/g (ref 0.0–30.0)
Microalb, Ur: 1.4 mg/dL (ref 0.0–1.9)

## 2010-08-26 LAB — HEMOGLOBIN A1C: Hgb A1c MFr Bld: 8.8 % — ABNORMAL HIGH (ref 4.6–6.5)

## 2010-09-03 NOTE — Progress Notes (Signed)
  Phone Note Call from Patient Call back at Home Phone [phone]   Caller: [phone] Call For: dr ellsion Summary of Call: per Verdene Rio call want a   test meter accu check sample  , and  one touch ultra strips sent to [facility]  pt test 3times a day Initial call taken by: Shelbie Proctor,  Dec 04, 2008 9:11 AM  Follow-up for Phone Call        these are 2 different brands.  i am happy to rx either. Follow-up by: Minus Breeding MD,  Dec 04, 2008 9:34 AM  Additional Follow-up for Phone Call Additional follow up Details #1::        pt want the one touch ultra Additional Follow-up by: Shelbie Proctor,  Dec 04, 2008 9:52 AM    Additional Follow-up for Phone Call Additional follow up Details #2::    sent Follow-up by: Minus Breeding MD,  Dec 04, 2008 12:49 PM  Additional Follow-up for Phone Call Additional follow up Details #3:: Details for Additional Follow-up Action Taken: called pt ti inform rx was sent Additional Follow-up by: Shelbie Proctor,  Dec 04, 2008 1:44 PM  New/Updated Medications: ONETOUCH ULTRA TEST  STRP (GLUCOSE BLOOD) three times a day, and lancets 150.01 ONETOUCH ULTRA SYSTEM W/DEVICE KIT (BLOOD GLUCOSE MONITORING SUPPL) as dir   Prescriptions: ONETOUCH ULTRA SYSTEM W/DEVICE KIT (BLOOD GLUCOSE MONITORING SUPPL) as dir  #1 device x 0   Entered and Authorized by:   Minus Breeding MD   Signed by:   Minus Breeding MD on 12/04/2008   Method used:   Electronically to        [facility]* (mail-order)       [address] [place], [place]  [address]       Ph: [phone]       Fax: [phone]   RxID:   [id number] ONETOUCH ULTRA TEST  STRP (GLUCOSE BLOOD) three times a day, and lancets 150.01  #270 x 3   Entered and Authorized by:   Minus Breeding MD   Signed by:   Minus Breeding MD on 12/04/2008   Method used:   Electronically to        [facility]* (mail-order)       [address] [place], [place]  [address]       Ph: [phone]       Fax: [phone]   RxID:   [id number]

## 2010-09-03 NOTE — Medication Information (Signed)
Summary: Diabetic testing supplies/[facility]  Diabetic testing supplies/[facility]   Imported By: Lester Suissevale 10/10/2008 09:39:23  _____________________________________________________________________  External Attachment:    Type:   Image     Comment:   External Document

## 2010-09-03 NOTE — Progress Notes (Signed)
Summary: zpak  Phone Note Call from Patient Call back at Home Phone [phone]   Caller: vm Call For: Nathaniel Thomas Summary of Call: Virus.  Requests Zpak.  [place] [facility].  Voice very scratchy on phone. Initial call taken by: Rudy Jew, RN,  August 17, 2009 11:52 AM  Follow-up for Phone Call        per dr Alesia Richards for z pack Follow-up by: Willy Eddy, LPN,  August 17, 2009 12:41 PM  Additional Follow-up for Phone Call Additional follow up Details #1::        Wife aware. Additional Follow-up by: Rudy Jew, RN,  August 17, 2009 2:22 PM    New/Updated Medications: ZITHROMAX Z-PAK 250 MG TABS (AZITHROMYCIN) As directed. Prescriptions: ZITHROMAX Z-PAK 250 MG TABS (AZITHROMYCIN) As directed.  #1 x 0   Entered by:   Rudy Jew, RN   Authorized by:   Stacie Glaze MD   Signed by:   Rudy Jew, RN on 08/17/2009   Method used:   Electronically to        [facility]* (retail)       [address]       [place], [place]  [address]       Ph: [phone]       Fax: [phone]   RxID:   [id number]

## 2010-09-03 NOTE — Assessment & Plan Note (Signed)
Summary: 3 month rov/nrj   Vital Signs:  Patient profile:   60 year old male Height:      76 inches Weight:      230 pounds BMI:     28.10 Temp:     98.2 degrees F oral Pulse rate:   76 / minute Resp:     14 per minute BP sitting:   130 / 76  (left arm)  Vitals Entered By: Willy Eddy, LPN (May 17, 2010 8:04 AM) CC: roa- fasting this am Is Patient Diabetic? Yes Did you bring your meter with you today? No   Primary Care Provider:  Stacie Glaze MD  CC:  roa- fasting this am.  History of Present Illness:  insulin requiring type 1? Diabetes Mellitus Follow-Up      This is a 60 year old man who presents for Type 1 diabetes mellitus follow-up.  The patient reports self managed hypoglycemia, but denies polyuria, polydipsia, blurred vision, hypoglycemia requiring help, weight loss, weight gain, and numbness of extremities.  The patient denies the following symptoms: neuropathic pain, chest pain, vomiting, orthostatic symptoms, poor wound healing, intermittent claudication, vision loss, and foot ulcer.  Since the last visit the patient reports good dietary compliance.  The patient has been measuring capillary blood glucose before breakfast.    Had multiple hypogylcemic episodes that resulted in reducing the insulin he takes the levimir at night and he feels that this helps his AM insulins are good He glucoses are very sensitive to exercize and heat  He has renal stone hx and has noted increased flank pain he has increased balance issues he notes decreased hearing  he has episodic SOD some at rest and some with activity... 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) 4)  Pneumonia, Left Lower Lobe  (ICD-481) 5)   Hyperlipidemia, With Low Hdl  (ICD-272.4) 6)  Hypertension, Borderline  (ICD-401.9) 7)  Iddm  (ICD-250.01) 8)  Urinary Calculus  (ICD-592.9) 9)  Uti  (ICD-599.0) 10)  Chickenpox, Hx of  (ICD-V15.9) 11)  Arthritis  (ICD-716.90) 12)  Urinary Incontinence  (ICD-788.30) 13)  Gerd  (ICD-530.81)  Current Problems (verified): 1)  Back Pain, Acute  (ICD-724.5) 2)  Osteoarthritis, Carpometacarpal Joint, Right Thumb  (ICD-715.94) 3)  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 Take 2 Two Times A Day 2)  Flomax 0.4 Mg Xr24h-Cap (Tamsulosin Hcl) .... Take 1 By Mouth Qd 3)  Humalog Mix 75/25 Kwikpen 75-25 % Susp (Insulin Lispro Prot & Lispro) .... 8un in Am and 20 Units in Pm 4)  Onetouch Ultra Test  Strp (Glucose Blood) .... Three Times A Day, and Lancets 150.01 5)  Onetouch Delica Lancets  Misc (Lancets) .[name].. 1 Three Times A Day 6)  Alpha-Lipoic Acid 50 Mg Caps (Alpha-Lipoic Acid) .[name].. 1 Once Daily 7)  Cialis 20 Mg Tabs (Tadalafil) .... As Needed 8)  Yl Vitamin C-Rose Hips 500 Mg Tabs (Ascorbic Acid) .[name].. 1 Once Daily 9)  Folic Acid 800 Mcg Tabs (Folic Acid) .[name].. 1 Once Daily 10)  Multivitamins  Caps (Multiple Vitamin) .[name].. 1 Once Daily 11)  Lisinopril 5 Mg Tabs (Lisinopril) .... One By Mouth Daily  ( Pt To Pay Cash Price Fro 90 Day Generic, Not Insurance) 12)  Levemir Flexpen 100 Unit/ml Soln (Insulin Detemir) .... 35 Units Daily\par 13)  Vitamin C 1000 Mg Tabs (Ascorbic Acid) .[name].. 1 Once Daily 14)  Cosamin Ds 500-400 Mg Caps (Glucosamine-Chondroitin) .[name].. 1 Once  Daily 15)  Glucosamine-Chondroitin 500-400 Mg Caps (Glucosamine-Chondroitin) .[name].. 1 Once Daily 16)  Etodolac 400 Mg Tabs (Etodolac) .[name].. 1 Two Times A Day 17)  Nutraburst Food Supplement .... Two Times A Day  Current Medications (verified): 1)  Metformin Hcl 500 Mg Xr24h-Tab (Metformin Hcl) .... Take 2 Two Times A Day 2)  Flomax 0.4 Mg Xr24h-Cap (Tamsulosin Hcl) .... Take 1 By Mouth Qd 3)  Humalog Mix 75/25 Kwikpen 75-25 % Susp (Insulin Lispro Prot & Lispro) .[name].. 10-15 Units in An Adn 20-25 Untis in Pm 4)  Onetouch Ultra Test  Strp (Glucose Blood) .... Three Times A Day, and Lancets 150.01 5)  Onetouch Delica Lancets  Misc (Lancets) .[name].. 1 Three Times A Day 6)  Alpha-Lipoic Acid 50 Mg Caps (Alpha-Lipoic Acid) .[name].. 1 Once Daily 7)  Cialis 20 Mg Tabs (Tadalafil) .... As Needed 8)  Yl Vitamin C-Rose Hips 500 Mg Tabs (Ascorbic Acid) .[name].. 1 Once Daily 9)  Folic Acid 800 Mcg Tabs (Folic Acid) .[name].. 1 Once Daily 10)  Multivitamins  Caps (Multiple Vitamin) .[name].. 1 Once Daily 11)  Lisinopril 5 Mg Tabs (Lisinopril) .... One By Mouth Daily  ( Pt To Pay Cash Price Fro 90 Day Generic, Not Insurance) 12)  Levemir Flexpen 100 Unit/ml Soln (Insulin Detemir) .... 35 Units Daily\par 13)  Etodolac 400 Mg Tabs (Etodolac) .[name].. 1 Two Times A Day 14)  Nutraburst Food Supplement .... Two Times A Day  Allergies (verified): 1)  ! Demerol  Past History:  Family History: Last updated: 08/30/2008 mother developed dm ad advanced age dm: half-brother  Social History: Last updated: 08/30/2008 married retired  Risk Factors: Exercise: yes (09/04/2009)  Risk Factors: Smoking Status: never (05/17/2010)  Past medical, surgical, family and social histories (including risk factors) reviewed, and no changes noted (except as noted below).  Past Medical History: Reviewed history from 08/30/2008 and no changes required. Diabetes Kidney Stone GERD Urinary incontinence  Past Surgical History: Reviewed history  from 08/30/2008 and no changes required. Tonsillectomy Kidney Stone removed Back Operations  Family History: Reviewed history from 08/30/2008 and no changes required. mother developed dm ad advanced age dm: half-brother  Social History: Reviewed history from 08/30/2008 and no changes required. married retired  Review of Systems       Flu Vaccine Consent Questions     Do you have a history of severe allergic reactions to this vaccine? no    Any prior history of allergic reactions to egg and/or gelatin? no    Do you have a sensitivity to the preservative Thimersol? no    Do you have a past history of Guillan-Barre Syndrome? no    Do you currently have an acute febrile illness? no    Have you ever had a severe reaction to latex? no    Vaccine information given and explained to patient? yes  Are you currently pregnant? no    Lot Number:[id number]   Exp Date:02/01/2011   Site Given  Left Deltoid IM   Physical Exam  General:  normal appearance.  well-developed and well-nourished.   Head:  normocephalic and atraumatic.   Eyes:  pupils equal and pupils round.   Ears:  R ear normal and no external deformities.   Nose:  no external deformity and no nasal discharge.   Mouth:  pharyngeal exudate and posterior lymphoid hypertrophy.   Neck:  supple.   Lungs:  normal respiratory effort, no dullness, and no wheezes.   Heart:  normal rate and regular rhythm.     Impression & Recommendations:  Problem # 1:  HYPERTENSION, BORDERLINE (ICD-401.9) Assessment Unchanged  His updated medication list for this problem includes:    Lisinopril 5 Mg Tabs (Lisinopril) ..... One by mouth daily  ( pt to pay cash price fro 90 day generic, not insurance)  BP today: 130/76 Prior BP: 130/80 (02/15/2010)  Prior 10 Yr Risk Heart Disease: 22 % (12/03/2009)  Labs Reviewed: K+: 4.7 (02/15/2010) Creat: : 0.8 (02/15/2010)   Chol: 174 (02/15/2010)   HDL: 32.60 (02/15/2010)   LDL: 91 (04/25/2009)   TG:  90.0 (04/25/2009)  Orders: Cardiolite (Cardiolite)  Problem # 2:  IDDM (ICD-250.01) Assessment: Unchanged  consideraton for the insulin pump he will check with insurance His updated medication list for this problem includes:    Metformin Hcl 500 Mg Xr24h-tab (Metformin hcl) .[name]... Take 2 two times a day    Humalog Mix 75/25 Kwikpen 75-25 % Susp (Insulin lispro prot & lispro) .Marland KitchenMarland KitchenMarland KitchenMarland Kitchen 10-15 units in an adn 20-25 untis in pm    Lisinopril 5 Mg Tabs (Lisinopril) ..... One by mouth daily  ( pt to pay cash price fro 90 day generic, not insurance)    Levemir Flexpen 100 Unit/ml Soln (Insulin detemir) .Marland KitchenMarland KitchenMarland KitchenMarland Kitchen 35 units daily\  Labs Reviewed: Creat: 0.8 (02/15/2010)     Last Eye Exam: normal (11/21/2009) Reviewed HgBA1c results: 8.1 (02/15/2010)  7.9 (12/03/2009)  Orders: Cardiolite (Cardiolite) Venipuncture (13086) TLB-BMP (Basic Metabolic Panel-BMET) (80048-METABOL) TLB-A1C / Hgb A1C (Glycohemoglobin) (83036-A1C)  Problem # 3:  ARTHRITIS (ICD-716.90) the pt has  calcifications in tendons on right hand  Problem # 4:  DYSPNEA (ICD-786.05) Assessment: New this could be a  anginal equivalent referral for myoview episodic dyspnea, the pt has no chest but he is a diabetic with HTN and LIPIDs  high risk pt  Orders: Spirometry w/Graph (94010) Cardiolite (Cardiolite)  Problem # 5:  CERUMEN IMPACTION, BILATERAL (ICD-380.4)  informed consent obtained, using a cerumin spoon the wax impaction was dislodged and the canal was lavaged with 1/2 peroxide and 1/2 warm water solution until clear  Orders: Cerumen Impaction Removal (57846)  Problem # 6:  URINARY CALCULUS (ICD-592.9) referral to [facility] for consideraton for lithotripsy  Complete Medication List: 1)  Metformin Hcl 500 Mg Xr24h-tab (Metformin hcl) .... Take 2 two times a day 2)  Flomax 0.4 Mg Xr24h-cap (Tamsulosin hcl) .... Take 1 by mouth qd 3)  Humalog Mix 75/25 Kwikpen 75-25 % Susp (Insulin lispro prot & lispro) .[name].. 10-15 units  in an adn 20-25 untis in pm 4)  Onetouch Ultra Test Strp (Glucose blood) .... Three times a day, and lancets 150.01 5)  Onetouch Delica Lancets Misc (Lancets) .[name].. 1 three times a day 6)  Alpha-lipoic Acid 50 Mg Caps (Alpha-lipoic acid) .[name].. 1 once daily 7)  Cialis 20 Mg Tabs (Tadalafil) .... As needed 8)  Yl Vitamin  C-rose Hips 500 Mg Tabs (Ascorbic acid) .[name].. 1 once daily 9)  Folic Acid 800 Mcg Tabs (Folic acid) .[name].. 1 once daily 10)  Multivitamins Caps (Multiple vitamin) .[name].. 1 once daily 11)  Lisinopril 5 Mg Tabs (Lisinopril) .... One by mouth daily  ( pt to pay cash price fro 90 day generic, not insurance) 12)  Levemir Flexpen 100 Unit/ml Soln (Insulin detemir) .... 35 units daily\par 13)  Etodolac 400 Mg Tabs (Etodolac) .[name].. 1 two times a day 14)  Nutraburst Food Supplement  .... Two times a day  Other Orders: Admin 1st Vaccine (60454) Flu Vaccine 31yrs + ([address]) TLB-Cholesterol, HDL (83718-HDL) TLB-Cholesterol, Direct LDL (83721-DIRLDL) TLB-Cholesterol, Total (82465-CHO)  Patient Instructions: 1)  Please schedule a follow-up appointment in 3 months.  Appended Document: Orders Update    Clinical Lists Changes  Orders: Added new Service order of Spirometry w/Graph (94010) - Signed      Appended Document: Orders Update    Clinical Lists Changes  Orders: Added new Service order of Specimen Handling (91478) - Signed

## 2010-09-03 NOTE — Progress Notes (Signed)
Summary: BS & thumb hurts & feels not great  Phone Note Call from Patient Call back at [phone]   Call For: bonnye Summary of Call: BS elevated since cortisone shot thumb.  Just now 206 4hr fast, 250 last pm, 197 this am.  249 lunch yesterday, 250 ac dinner 25 humalog & 25 Levamir.  187 this am Took 10 of Humalog,  206 just now 4hr since eating.   Stomach kinda upset & haven't felt great since.  Sore throat & glands swollen yesterday & turtleneck felt tight.  All gone this am, but doesn't feel that great.  Thumb hurts as much as ever. Initial call taken by: Rudy Jew, RN,  September 05, 2009 11:42 AM  Follow-up for Phone Call        reassured pt and told him to try ice Follow-up by: Willy Eddy, LPN,  September 05, 2009 12:29 PM

## 2010-09-03 NOTE — Progress Notes (Signed)
Summary: refill  Phone Note Call from Patient Call back at [phone]   Reason for Call: Acute Illness, Refill Medication Summary of Call: Metformin 500mg  to [facility] [phone]. Initial call taken by: Rudy Jew, RN,  April 29, 2010 11:27 AM  Follow-up for Phone Call        appears dr Everardo All nurse Follow-up by: Willy Eddy, LPN,  April 29, 2010 11:30 AM

## 2010-09-03 NOTE — Assessment & Plan Note (Signed)
Summary: ROA/FUP/RCD   Vital Signs:  Patient profile:   60 year old male Height:      76 inches Weight:      226 pounds BMI:     27.61 Temp:     98.2 degrees F oral Pulse rate:   80 / minute Resp:     14 per minute BP sitting:   144 / 90  (left arm)  Vitals Entered By: Willy Eddy, LPN (September 04, 2009 9:01 AM) CC: roa-labs last ov   Primary Care Provider:  Stacie Glaze MD  CC:  roa-labs last ov.  History of Present Illness: having a few lows with one severe drop a few weeks ago had to take a sugar tablet/ packet. He woke up with the episode. He did not take a bedtime snak. severe pain in the thumbs right greater that left with hx of OA and family hx of OA ( severe  Diabetes Management History:      He has not been enrolled in the "Diabetic Education Program".  He states understanding of dietary principles and is following his diet appropriately.  Sensory loss is noted.  Self foot exams are being performed.  He is checking home blood sugars.  He says that he is exercising.  Type of exercise includes: walking.  Duration of exercise is estimated to be 30 min.  He is doing this 3 times per week.        Reported hypoglycemic symptoms include sweats, nausea, and confusion.  Frequency of hypoglycemic symptoms are reported to be seldom.  Other comments include: one very low glucose reported with an active golf trip.        Symptoms which suggest diabetic complications include lightheadedness/orthostatic.  The following treatment plan problems are reported: diet problems.  No changes have been made to his treatment plan since last visit.    Preventive Screening-Counseling & Management  Alcohol-Tobacco     Smoking Status: never  Caffeine-Diet-Exercise     Does Patient Exercise: yes     Type of exercise: walking     Exercise (avg: min/session): 30 min.     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 Take 2 Two Times A Day 2)  Flomax 0.4 Mg Xr24h-Cap (Tamsulosin Hcl) .... Take 1 By Mouth Qd 3)  Prostate  Tabs (Specialty Vitamins Products) .... Take 2 By Mouth Qd 4)  Novolog Mix 70/30 Flexpen 70-30 % Susp (Insulin Aspart Prot & Aspart) .... Use Two Times A Day On Siding Scale Starting 20 Two Times A Day  5)  Onetouch Ultra Test  Strp (Glucose Blood) .... Three Times A Day, and Lancets 150.01 6)  Onetouch Ultra System W/device Kit (Blood Glucose Monitoring Suppl) .... As Dir 7)  Alpha-Lipoic Acid 50 Mg Caps (Alpha-Lipoic Acid) .[name].. 1 Once Daily 8)  Cialis 20 Mg Tabs (Tadalafil) .... As Needed 9)  Yl Vitamin C-Rose Hips 500 Mg Tabs (Ascorbic Acid) .[name].. 1 Once Daily 10)  Folic Acid 800 Mcg Tabs (Folic Acid) .[name].. 1 Once Daily 11)  Multivitamins  Caps (Multiple Vitamin) .[name].. 1 Once Daily 12)  Lisinopril 5 Mg Tabs (Lisinopril) .... One By Mouth Daily  ( Pt To Pay Cash Price Fro 90 Day Generic, Not Insurance) 13)  Levemir Flexpen 100 Unit/ml Soln (Insulin Detemir) .... 25 Units Daily\par 14)  Vitamin C 1000 Mg Tabs (Ascorbic Acid) .[name].. 1 Once Daily 15)  Zithromax Z-Pak 250 Mg  Tabs (Azithromycin) .... As Directed.  Current Medications (verified): 1)  Metformin Hcl 500 Mg Xr24h-Tab (Metformin Hcl) .... Take 2 Two Times A Day 2)  Flomax 0.4 Mg Xr24h-Cap (Tamsulosin Hcl) .... Take 1 By Mouth Qd 3)  Prostate  Tabs (Specialty Vitamins Products) .... Take 2 By Mouth Qd 4)  Novolog Mix 70/30 Flexpen 70-30 % Susp (Insulin Aspart Prot & Aspart) .[name].. 10 U Q Am and 25 At Night 5)  Onetouch Ultra Test  Strp (Glucose Blood) .... Three Times A Day, and Lancets 150.01 6)  Onetouch Ultra System W/device Kit (Blood Glucose Monitoring Suppl) .... As Dir 7)  Alpha-Lipoic Acid 50 Mg Caps (Alpha-Lipoic Acid) .[name].. 1 Once Daily 8)  Cialis 20 Mg Tabs (Tadalafil) .... As Needed 9)  Yl Vitamin C-Rose Hips 500 Mg Tabs (Ascorbic Acid) .[name].. 1 Once Daily 10)  Folic Acid 800 Mcg Tabs (Folic Acid) .[name].. 1 Once Daily 11)  Multivitamins  Caps (Multiple Vitamin) .[name].. 1 Once Daily 12)  Lisinopril 5 Mg Tabs (Lisinopril) .... One By Mouth Daily  ( Pt To Pay Cash Price Fro 90 Day Generic, Not Insurance) 13)  Levemir Flexpen 100 Unit/ml Soln (Insulin Detemir) .... 25 Units Daily\par 14)  Vitamin C 1000 Mg Tabs (Ascorbic Acid) .[name].. 1 Once Daily  Allergies (verified): 1)  ! Demerol  Past History:  Family History: Last updated: 08/30/2008 mother developed dm ad advanced age dm: half-brother  Social History: Last updated: 08/30/2008 married retired  Risk Factors: Exercise: yes (09/04/2009)  Risk Factors: Smoking Status: never (09/04/2009)  Past medical, surgical, family and social histories (including risk factors) reviewed, and no changes noted (except as noted below).  Past Medical History: Reviewed history from 08/30/2008 and no changes required. Diabetes Kidney Stone GERD Urinary incontinence  Past Surgical History: Reviewed history from 08/30/2008 and no changes required. Tonsillectomy Kidney Stone removed Back Operations  Family History: Reviewed history from 08/30/2008 and  no changes required. mother developed dm ad advanced age dm: half-brother  Social History: Reviewed history from 08/30/2008 and no changes required. married retiredDoes Patient Exercise:  yes  Review of Systems  The patient denies anorexia, fever, weight loss, weight gain, vision loss, decreased hearing, hoarseness, chest pain, syncope, dyspnea on exertion, peripheral edema, prolonged cough, headaches, hemoptysis, abdominal pain, melena, hematochezia, severe indigestion/heartburn, hematuria, incontinence, genital sores, muscle weakness, suspicious skin lesions, transient blindness, difficulty walking, depression, unusual weight change, abnormal bleeding, enlarged lymph nodes, angioedema, and breast masses.    Physical Exam  General:  normal appearance.  well-developed and well-nourished.   Head:  normocephalic and atraumatic.   Eyes:  pupils equal and  pupils round.   Ears:  R ear normal and no external deformities.   Nose:  no external deformity and no nasal discharge.   Mouth:  pharyngeal exudate and posterior lymphoid hypertrophy.   Neck:  supple.   Lungs:  normal respiratory effort, no dullness, and no wheezes.   Heart:  normal rate and regular rhythm.   Msk:  decreased ROM, joint tenderness, and joint swelling.    Diabetes Management Exam:    Foot Exam (with socks and/or shoes not present):       Sensory-Pinprick/Light touch:          Left medial foot (L-4): diminished          Left dorsal foot (L-5): diminished          Left lateral foot (S-1): diminished          Right medial foot (L-4): diminished          Right dorsal foot (L-5): diminished          Right lateral foot (S-1): diminished    Eye Exam:       Eye Exam done elsewhere   Impression & Recommendations:  Problem # 1:  IDDM (ICD-250.01) the last A1c elevaton may be due to the prolonged uri that the pt was treated forr in the fall the pt reported a significant night time hypoglucemic episode and we have  initiated night time snaks q HS His updated medication list for this problem includes:    Metformin Hcl 500 Mg Xr24h-tab (Metformin hcl) .[name]... Take 2 two times a day    Novolog Mix 70/30 Flexpen 70-30 % Susp (Insulin aspart prot & aspart) .Marland KitchenMarland KitchenMarland KitchenMarland Kitchen 10 u q am and 25 at night    Lisinopril 5 Mg Tabs (Lisinopril) ..... One by mouth daily  ( pt to pay cash price fro 90 day generic, not insurance)    Levemir Flexpen 100 Unit/ml Soln (Insulin detemir) .[name]... 25 units daily\  Labs Reviewed: Creat: 0.7 (07/03/2009)    Reviewed HgBA1c results: 8.0 (07/03/2009)  7.8 (04/25/2009)  Orders: Venipuncture ([address]) TLB-BMP (Basic Metabolic Panel-BMET) (80048-METABOL) TLB-A1C / Hgb A1C (Glycohemoglobin) (83036-A1C)  Problem # 2:  OSTEOARTHRITIS, CARPOMETACARPAL JOINT, RIGHT THUMB (ICD-715.94)  Informed consent obtained and then the joint was prepped in a sterile manor and 20 mg depo and 1/4 cc 1% lidocaine injected into the synovial space. After care discussed. Pt tolerated procedure well.  Discussed use of medications, application of heat or cold, and exercises.   Orders: Joint Aspirate / Injection, Small (60454) Depo-Medrol 20mg  (J1020)  Problem # 3:  HYPERTENSION, BORDERLINE (ICD-401.9)  His updated medication list for this problem includes:    Lisinopril 5 Mg Tabs (Lisinopril) ..... One by mouth daily  ( pt to pay cash price fro 90 day generic, not insurance)  BP today: 144/90 Prior BP: 130/77 (07/03/2009)  Labs Reviewed: K+: 4.3 (07/03/2009) Creat: : 0.7 (07/03/2009)   Chol: 138 (04/25/2009)   HDL: 28.90 (04/25/2009)   LDL: 91 (04/25/2009)   TG: 90.0 (04/25/2009)  Problem # 4:  GERD (ICD-530.81) stable  Complete Medication List: 1)  Metformin Hcl 500 Mg Xr24h-tab (Metformin hcl) .... Take 2 two times a day 2)  Flomax 0.4 Mg Xr24h-cap (Tamsulosin hcl) .... Take 1 by mouth qd 3)  Prostate Tabs (Specialty vitamins products) .... Take 2 by mouth qd 4)  Novolog Mix 70/30 Flexpen 70-30 % Susp  (Insulin aspart prot & aspart) .[name].. 10 u q am and 25 at night 5)  Onetouch  Ultra Test Strp (Glucose blood) .... Three times a day, and lancets 150.01 6)  Onetouch Ultra System W/device Kit (Blood glucose monitoring suppl) .... As dir 7)  Alpha-lipoic Acid 50 Mg Caps (Alpha-lipoic acid) .[name].. 1 once daily 8)  Cialis 20 Mg Tabs (Tadalafil) .... As needed 9)  Yl Vitamin C-rose Hips 500 Mg Tabs (Ascorbic acid) .[name].. 1 once daily 10)  Folic Acid 800 Mcg Tabs (Folic acid) .[name].. 1 once daily 11)  Multivitamins Caps (Multiple vitamin) .[name].. 1 once daily 12)  Lisinopril 5 Mg Tabs (Lisinopril) .... One by mouth daily  ( pt to pay cash price fro 90 day generic, not insurance) 13)  Levemir Flexpen 100 Unit/ml Soln (Insulin detemir) .... 25 units daily\par 14)  Vitamin C 1000 Mg Tabs (Ascorbic acid) .[name].. 1 once daily  Diabetes Management Assessment/Plan:      His blood pressure goal is < 130/80.    Patient Instructions: 1)  Please schedule a follow-up appointment in 3 months  need eye exam.

## 2010-09-03 NOTE — Progress Notes (Signed)
  Phone Note Call from Patient   Caller: Patient Call For: Stacie Glaze MD Summary of Call: Pt forgot to get the Levamir today.  Needs the PIN called to [facility]) Initial call taken by: Lynann Beaver CMA,  May 30, 2009 12:59 PM    New/Updated Medications: LEVEMIR FLEXPEN 100 UNIT/ML SOLN (INSULIN DETEMIR) 25 units daily\par Prescriptions: LEVEMIR FLEXPEN 100 UNIT/ML SOLN (INSULIN DETEMIR) 25 units daily #1pen x 6   Entered by:   Willy Eddy, LPN   Authorized by:   Stacie Glaze MD   Signed by:   Willy Eddy, LPN on 16/05/9603   Method used:   Electronically to        [facility]  [address] (retail)       [address]       [place], [place]  [address]       Ph: [phone]       Fax: [phone]   RxID:   [id number]

## 2010-09-03 NOTE — Progress Notes (Signed)
Summary: Nuclear pre procedure  Phone Note Outgoing Call Call back at Home Phone [phone]   Call placed by: Rea College, CMA,  May 28, 2010 3:47 PM Call placed to: Patient Summary of Call: Reviewed information on [employer] Information Sheet (see scanned document for further details).  Spoke with patient's wife.      Nuclear Med Background Indications for Stress Test: Evaluation for Ischemia   History: COPD   Symptoms: Chest Tightness, DOE, SOB    Nuclear Pre-Procedure Cardiac Risk Factors: Hypertension, IDDM Type 1, Lipids Height (in): 76

## 2010-09-03 NOTE — Progress Notes (Signed)
  Phone Note Refill Request Message from:  Fax from Pharmacy on Dec 28, 2009 3:08 PM  Refills Requested: Medication #1:  ONETOUCH ULTRA TEST  STRP three times a day   Dosage confirmed as above?Dosage Confirmed Initial call taken by: Josph Macho RMA,  Dec 28, 2009 3:09 PM    Prescriptions: [place] TEST  STRP (GLUCOSE BLOOD) three times a day, and lancets 150.01  #270 x 3   Entered by:   Josph Macho RMA   Authorized by:   Minus Breeding MD   Signed by:   Josph Macho RMA on 12/28/2009   Method used:   Faxed to ...       [facility] (mail-order)       [address] [place], [place]  [address]       Ph: [phone]       Fax: [phone]   RxID:   [id number]

## 2010-09-03 NOTE — Progress Notes (Signed)
Summary: results  Phone Note Call from Patient Call back at Home Phone [phone]   Caller: Patient Call For: Stacie Glaze MD Reason for Call: Talk to Doctor Summary of Call: [phone]  Please call for Stress Test Results. Initial call taken by: Lynann Beaver CMA AAMA,  June 05, 2010 8:09 AM  Follow-up for Phone Call        nl except for hypertensvie response-per dr Lovell Sheehan- increase lisinopril to 10 mg once daily- pt conplaining of indigestion so suggested otc prilosec- let us know how that works Follow-up by: Willy Eddy, LPN,  June 05, 2010 11:27 AM    New/Updated Medications: LISINOPRIL 10 MG TABS (LISINOPRIL) 1 once daily

## 2010-09-03 NOTE — Progress Notes (Signed)
Summary: Pt req script for Metformin HCL 50mg  to [facility] in [place]  Phone Note Refill Request Call back at [phone] cell Message from:  Patient on May 28, 2010 11:28 AM  Refills Requested: Medication #1:  METFORMIN HCL 500 MG XR24H-TAB TAKE 2 two times a day   Dosage confirmed as above?Dosage Confirmed Pls call in to [facility] : [facility] in [place] [address] [phone]   Method Requested: Telephone to Pharmacy Initial call taken by: [name] Antigua,  May 28, 2010 11:28 AM    Prescriptions: METFORMIN HCL 500 MG XR24H-TAB (METFORMIN HCL) TAKE 2 two times a day  #120 Each x 6   Entered by:   Willy Eddy, LPN   Authorized by:   Stacie Glaze MD   Signed by:   Willy Eddy, LPN on 29/56/2130   Method used:   Electronically to        [facility]  [address] (retail)       [address]       [place], [place]  [address]       Ph: [phone]       Fax: [phone]   RxID:   [id number]

## 2010-09-03 NOTE — Miscellaneous (Signed)
Summary: [facility]  [facility]   Imported By: Maryln Gottron 12/27/2009 15:08:24  _____________________________________________________________________  External Attachment:    Type:   Image     Comment:   External Document

## 2010-09-03 NOTE — Assessment & Plan Note (Signed)
Summary: 3 MONTH ROV/NJR   Vital Signs:  Patient profile:   60 year old male Height:      76 inches Weight:      236 pounds BMI:     28.83 Temp:     98.2 degrees F oral Pulse rate:   80 / minute Resp:     14 per minute BP sitting:   146 / 84  (left arm)  Vitals Entered By: Willy Eddy, LPN (Dec 03, 452 9:33 AM) CC: roa- wants to change to humalog due to price , Hypertension Management   Primary Care Provider:  Stacie Glaze MD  CC:  roa- wants to change to humalog due to price  and Hypertension Management.  History of Present Illness: pt has been to te chriopracter 5 x muscle spasm in the center of the back with grabing pain that was not effected with muscle relaxers alevel helps some cannot play golf original injury was with a golf swing widely fluating glucoses with increase in the need for the novolog does better with the 75.25  Hypertension History:      He denies headache, chest pain, palpitations, dyspnea with exertion, orthopnea, PND, peripheral edema, visual symptoms, neurologic problems, syncope, and side effects from treatment.        Positive major cardiovascular risk factors include male age 31 years old or older, diabetes, hyperlipidemia, and hypertension.  Negative major cardiovascular risk factors include non-tobacco-user status.     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) 3)  Pneumonia, Left Lower Lobe  (ICD-481) 4)  Hyperlipidemia, With Low Hdl  (ICD-272.4) 5)  Hypertension, Borderline  (ICD-401.9) 6)  Iddm  (ICD-250.01) 7)  Urinary Calculus  (ICD-592.9) 8)  Uti  (ICD-599.0) 9)  Chickenpox, Hx of  (ICD-V15.9) 10)  Arthritis  (ICD-716.90) 11)  Urinary Incontinence  (ICD-788.30) 12)  Gerd  (ICD-530.81)  Current Problems (verified): 1)   Osteoarthritis, Carpometacarpal Joint, Right Thumb  (ICD-715.94) 2)  Other Enthesopathy of Ankle and Tarsus  (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 Take 2 Two Times A Day 2)  Flomax 0.4 Mg Xr24h-Cap (Tamsulosin Hcl) .... Take 1 By Mouth Qd 3)  Prostate  Tabs (Specialty Vitamins Products) .... Take 2 By Mouth Qd 4)  Novolog Mix 70/30 Flexpen 70-30 % Susp (Insulin Aspart Prot & Aspart) .[name].. 10 U Q Am and 25 At Night 5)  Onetouch Ultra Test  Strp (Glucose Blood) .... Three Times A Day, and Lancets 150.01 6)  Onetouch Ultra System W/device Kit (Blood Glucose Monitoring Suppl) .... As Dir 7)  Alpha-Lipoic Acid 50 Mg Caps (Alpha-Lipoic Acid) .[name].. 1 Once Daily 8)  Cialis 20 Mg Tabs (Tadalafil) .... As Needed 9)  Yl Vitamin C-Rose Hips 500 Mg Tabs (Ascorbic Acid) .[name].. 1 Once Daily 10)  Folic Acid 800 Mcg Tabs (Folic Acid) .[name].. 1 Once Daily 11)  Multivitamins  Caps (Multiple Vitamin) .Marland KitchenMarland KitchenMarland Kitchen 1  Once Daily 12)  Lisinopril 5 Mg Tabs (Lisinopril) .... One By Mouth Daily  ( Pt To Pay Cash Price Fro 90 Day Generic, Not Insurance) 13)  Levemir Flexpen 100 Unit/ml Soln (Insulin Detemir) .... 25 Units Daily\par 14)  Vitamin C 1000 Mg Tabs (Ascorbic Acid) .[name].. 1 Once Daily 15)  Parafon Forte Dsc 500 Mg Tabs (Chlorzoxazone) .... One By Mouth Two Times A Day X 5 Days  Current Medications (verified): 1)  Metformin Hcl 500 Mg Xr24h-Tab (Metformin Hcl) .... Take 2 Two Times A Day 2)  Flomax 0.4 Mg Xr24h-Cap (Tamsulosin Hcl) .... Take 1 By Mouth Qd 3)  Prostate  Tabs (Specialty Vitamins Products) .... Take 2 By Mouth Qd 4)   Novolog Mix 70/30 Flexpen 70-30 % Susp (Insulin Aspart Prot & Aspart) .... 20 Subq in Am and 40 Subq At Bedtime 5)  Onetouch Ultra Test  Strp (Glucose Blood) .... Three Times A Day, and Lancets 150.01 6)  Onetouch Ultra System W/device Kit (Blood Glucose Monitoring Suppl) .... As Dir 7)  Alpha-Lipoic Acid 50 Mg Caps (Alpha-Lipoic Acid) .[name].. 1 Once Daily 8)  Cialis 20 Mg Tabs (Tadalafil) .... As Needed 9)  Yl Vitamin C-Rose Hips 500 Mg Tabs (Ascorbic Acid) .[name].. 1 Once Daily 10)  Folic Acid 800 Mcg Tabs (Folic Acid) .[name].. 1 Once Daily 11)  Multivitamins  Caps (Multiple Vitamin) .[name].. 1 Once Daily 12)  Lisinopril 5 Mg Tabs (Lisinopril) .... One By Mouth Daily  ( Pt To Pay Cash Price Fro 90 Day Generic, Not Insurance) 13)  Levemir Flexpen 100 Unit/ml Soln (Insulin Detemir) .... 25 Units Daily\par 14)  Vitamin C 1000 Mg Tabs (Ascorbic Acid) .[name].. 1 Once Daily 15)  Cosamin Ds 500-400 Mg Caps (Glucosamine-Chondroitin) .[name].. 1 Once Daily 16)  Glucosamine 500 Mg Caps (Glucosamine Sulfate) .[name].. 1 Once Daily 17)  Glucosamine-Chondroitin 500-400 Mg Caps (Glucosamine-Chondroitin) .[name].. 1 Once Daily  Allergies: 1)  ! Demerol  Past History:  Family History: Last updated: 08/30/2008 mother developed dm ad advanced age dm: half-brother  Social History: Last updated: 08/30/2008 married retired  Risk Factors: Exercise: yes (09/04/2009)  Risk Factors: Smoking Status: never (12/03/2009)  Past medical, surgical, family and social histories (including risk factors) reviewed, and no changes noted (except as noted below).  Past Medical History: Reviewed history from 08/30/2008 and no changes required. Diabetes Kidney Stone GERD Urinary incontinence  Past Surgical History: Reviewed history from 08/30/2008 and no changes required. Tonsillectomy Kidney Stone removed Back Operations  Family History: Reviewed history from 08/30/2008 and no changes required. mother developed dm ad advanced age dm:  half-brother  Social History: Reviewed history from 08/30/2008 and no changes required. married retired  Review of Systems  The patient denies anorexia, fever, weight loss, weight gain, vision loss, decreased hearing, hoarseness, chest pain, syncope, dyspnea on exertion, peripheral edema, prolonged cough, headaches, hemoptysis, abdominal pain, melena, hematochezia, severe indigestion/heartburn, hematuria, incontinence, genital sores, muscle weakness, suspicious skin lesions, transient blindness, difficulty walking, depression, unusual weight change, abnormal bleeding, enlarged lymph nodes, angioedema, breast masses, and testicular masses.    Physical Exam  General:  normal appearance.  well-developed and well-nourished.   Head:  normocephalic and atraumatic.   Eyes:  pupils equal and pupils round.   Ears:  R ear normal and no external deformities.   Nose:  no external deformity and no nasal discharge.   Mouth:  pharyngeal exudate and posterior lymphoid hypertrophy.   Neck:  supple.   Lungs:  normal respiratory effort, no dullness, and  no wheezes.   Heart:  normal rate and regular rhythm.   Abdomen:  abdomen is soft, nontender.  no hepatosplenomegaly.   not distended.  no hernia soft, non-tender, and normal bowel sounds.   Msk:  decreased ROM, joint tenderness, and joint swelling.   Pulses:  dorsalis pedis intact bilat.   Extremities:  no deformity.  no ulcer on the feet.  feet are of normal color and temp.  no edema  Neurologic:  alert & oriented X3, gait normal, and DTRs symmetrical and normal.    Diabetes Management Exam:    Eye Exam:       Eye Exam done elsewhere          Date: 11/21/2009          Results: normal          Done by: doctors vision   Impression & Recommendations:  Problem # 1:  BACK PAIN, ACUTE (ICD-724.5)  point injection of muscle  and tendon  highest pain pint The following medications were removed from the medication list:    Parafon Forte Dsc 500 Mg  Tabs (Chlorzoxazone) ..... One by mouth two times a day x 5 days  Orders: Physical Therapy Referral (PT)  Problem # 2:  HYPERTENSION, BORDERLINE (ICD-401.9) elevated blood pressure His updated medication list for this problem includes:    Lisinopril 5 Mg Tabs (Lisinopril) ..... One by mouth daily  ( pt to pay cash price fro 90 day generic, not insurance)  BP today: 146/84 Prior BP: 144/90 (09/04/2009)  10 Yr Risk Heart Disease: 22 %  Labs Reviewed: K+: 4.6 (09/04/2009) Creat: : 0.8 (09/04/2009)   Chol: 138 (04/25/2009)   HDL: 28.90 (04/25/2009)   LDL: 91 (04/25/2009)   TG: 90.0 (04/25/2009)  Problem # 3:  IDDM (ICD-250.01)  His updated medication list for this problem includes:    Metformin Hcl 500 Mg Xr24h-tab (Metformin hcl) .[name]... Take 2 two times a day    Humalog Mix 75/25 Kwikpen 75-25 % Susp (Insulin lispro prot & lispro) .[name]... 20 un in am and 40 units in pm    Lisinopril 5 Mg Tabs (Lisinopril) ..... One by mouth daily  ( pt to pay cash price fro 90 day generic, not insurance)    Levemir Flexpen 100 Unit/ml Soln (Insulin detemir) .[name]... 25 units daily\  Labs Reviewed: Creat: 0.8 (09/04/2009)    Reviewed HgBA1c results: 8.1 (09/04/2009)  8.0 (07/03/2009)  Orders: Venipuncture ([address]) TLB-BMP (Basic Metabolic Panel-BMET) (80048-METABOL) TLB-A1C / Hgb A1C (Glycohemoglobin) (83036-A1C)  Problem # 4:  OTHER ENTHESOPATHY OF ANKLE AND TARSUS (ICD-726.79)  seeing Bednars for tendon injury  Orders: Physical Therapy Referral (PT)  Complete Medication List: 1)  Metformin Hcl 500 Mg Xr24h-tab (Metformin hcl) .... Take 2 two times a day 2)  Flomax 0.4 Mg Xr24h-cap (Tamsulosin hcl) .... Take 1 by mouth qd 3)  Prostate Tabs (Specialty vitamins products) .... Take 2 by mouth qd 4)  Humalog Mix 75/25 Kwikpen 75-25 % Susp (Insulin lispro prot & lispro) .... 20 un in am and 40 units in pm 5)  Onetouch Ultra Test Strp (Glucose blood) .... Three times a day, and lancets 150.01 6)   Onetouch Ultra System W/device Kit (Blood glucose monitoring suppl) .... As dir 7)  Alpha-lipoic Acid 50 Mg Caps (Alpha-lipoic acid) .[name].. 1 once daily 8)  Cialis 20 Mg Tabs (Tadalafil) .... As needed 9)  Yl Vitamin C-rose Hips 500 Mg Tabs (Ascorbic acid) .[name].. 1 once daily 10)  Folic Acid 800  Mcg Tabs (Folic acid) .[name].. 1 once daily 11)  Multivitamins Caps (Multiple vitamin) .[name].. 1 once daily 12)  Lisinopril 5 Mg Tabs (Lisinopril) .... One by mouth daily  ( pt to pay cash price fro 90 day generic, not insurance) 13)  Levemir Flexpen 100 Unit/ml Soln (Insulin detemir) .... 25 units daily\par 14)  Vitamin C 1000 Mg Tabs (Ascorbic acid) .[name].. 1 once daily 15)  Cosamin Ds 500-400 Mg Caps (Glucosamine-chondroitin) .[name].. 1 once daily 16)  Glucosamine 500 Mg Caps (Glucosamine sulfate) .[name].. 1 once daily 17)  Glucosamine-chondroitin 500-400 Mg Caps (Glucosamine-chondroitin) .[name].. 1 once daily  Hypertension Assessment/Plan:      The patient's hypertensive risk group is category C: Target organ damage and/or diabetes.  His calculated 10 year risk of coronary heart disease is 22 %.  Today's blood pressure is 146/84.  His blood pressure goal is < 130/80.  Patient Instructions: 1)  Please schedule a follow-up appointment in 2 months. Prescriptions: HUMALOG MIX 75/25 KWIKPEN 75-25 % SUSP (INSULIN LISPRO PROT & LISPRO) 20 un in AM and 40 units in PM  #10 pens x 3   Entered and Authorized by:   Stacie Glaze MD   Signed by:   Stacie Glaze MD on 12/03/2009   Method used:   Faxed to ...       [facility] (mail[facility])       [address] [place], [place]  [address]       Ph: [phone]       Fax: [phone]   RxID:   [id number]

## 2010-09-03 NOTE — Progress Notes (Signed)
Summary: needs prior----not a refill request  Phone Note Refill Request Call back at Home Phone [phone]   Refills Requested: Medication #1:  LEVEMIR FLEXPEN 100 UNIT/ML SOLN 35 units daily, VITAMIN C 1000 MG TABS 1 once daily need pa---rx number--[id number]. [facility] @ [phone]. mail order pharmacy.  Initial call taken by: Warnell Forester,  March 22, 2010 9:37 AM  Follow-up for Phone Call        no prior Berkley Harvey is needed per [facility]. They shipped an order to him on 7-14--2011. His next refill is not due until - 04-22-2010. spoke with wife and she will relate info to her husband. Follow-up by: Warnell Forester,  March 22, 2010 10:38 AM

## 2010-09-03 NOTE — Letter (Signed)
Summary: [facility]  [facility]   Imported By: Maryln Gottron 02/21/2010 14:49:09  _____________________________________________________________________  External Attachment:    Type:   Image     Comment:   External Document

## 2010-09-03 NOTE — Letter (Signed)
Summary: Diabetic Eye Exam/[facility]  Diabetic Eye Exam/[facility]   Imported By: Maryln Gottron 11/15/2009 14:02:03  _____________________________________________________________________  External Attachment:    Type:   Image     Comment:   External Document

## 2010-09-03 NOTE — Assessment & Plan Note (Signed)
Summary: Cardiology Nuclear Testing  Nuclear Med Background Indications for Stress Test: Evaluation for Ischemia   History: COPD   Symptoms: Chest Tightness, DOE, SOB    Nuclear Pre-Procedure Cardiac Risk Factors: Hypertension, IDDM Type 1, Lipids Caffeine/Decaff Intake: None NPO After: 6:00 PM Lungs: clear IV 0.9% NS with Angio Cath: 20g     IV Site: R Antecubital IV Started by: Irean Hong, RN Chest Size (in) 46     Height (in): 76 Weight (lb): 227 BMI: 27.73  Nuclear Med Study 1 or 2 day study:  1 day     Stress Test Type:  Stress Reading MD:  Charlton Haws, MD     Referring MD:  J.Jenkins Resting Radionuclide:  Technetium 80m Tetrofosmin     Resting Radionuclide Dose:  11 mCi  Stress Radionuclide:  Technetium 45m Tetrofosmin     Stress Radionuclide Dose:  33 mCi   Stress Protocol Exercise Time (min):  6:00 min     Max HR:  150 bpm     Predicted Max HR:  160 bpm  Max Systolic BP: 212 mm Hg     Percent Max HR:  93.75 %     METS: 7.0 Rate Pressure Product:  [address]    Stress Test Technologist:  Milana Na, EMT-P     Nuclear Technologist:  Domenic Polite, CNMT  Rest Procedure  Myocardial perfusion imaging was performed at rest 45 minutes following the intravenous administration of Technetium 13m Tetrofosmin.  Stress Procedure  The patient exercised for 6:00. The patient stopped due to fatigue and denied any chest pain.  There were no significant ST-T wave changes and a rare pvc.  Technetium 38m Tetrofosmin was injected at peak exercise and myocardial perfusion imaging was performed after a brief delay.  QPS Raw Data Images:  Normal; no motion artifact; normal heart/lung ratio. Stress Images:  Normal homogeneous uptake in all areas of the myocardium. Rest Images:  Normal homogeneous uptake in all areas of the myocardium. Subtraction (SDS):  Normal Transient Ischemic Dilatation:  0.90  (Normal <1.22)  Lung/Heart Ratio:  0.32  (Normal <0.45)  Quantitative Gated  Spect Images QGS EDV:  110 ml QGS ESV:  52 ml QGS EF:  53 % QGS cine images:  normal  Findings Normal nuclear study      Overall Impression  Exercise Capacity: Fair exercise capacity. BP Response: Hypertensive blood pressure response. Clinical Symptoms: No chest pain ECG Impression: No significant ST segment change suggestive of ischemia. Overall Impression: Normal stress nuclear study. Overall Impression Comments: Hypertensive response to exercise

## 2010-09-03 NOTE — Assessment & Plan Note (Signed)
Summary: 1 MTH FU---$50---STC   Vital Signs:  Patient profile:   60 year old male Height:      76 inches (193.04 cm) Weight:      242.6 pounds (110.27 kg) BMI:     29.64 O2 Sat:      96 % Temp:     98.0 degrees F (36.67 degrees C) oral Pulse rate:   70 / minute BP sitting:   134 / 82  (left arm) Cuff size:   large  Vitals Entered By: Orlan Leavens (October 13, 2008 8:16 AM)  Vision Screening:      Vision Comments: Patient states last eye exam done 2009 @ Dr. Lorrin Goodell in [place]   Referring Provider:  SELF Primary Provider:  DR. DONALD MOORE   History of Present Illness: pt says he feels well in general.  he is having hypoglycemia after breakfast and after supper, with exercise.  however, this does not happen on all occasions.  most cbg's are in the 200's, with no pattern throughout the day.   Allergies: 1)  ! Demerol  Past History  Past Medical History: Diabetes Kidney Stone GERD Urinary incontinence  (08/30/2008)  Family History:    Reviewed history from 08/30/2008 and no changes required:       mother developed dm ad advanced age       dm: half-brother  Review of Systems  The patient denies syncope.    Physical Exam  General:  normal appearance.   Psych:  Alert and cooperative; normal mood and affect; normal attention span and concentration.   Additional Exam:  he brings a record of his cbg's which i have reviewed today.    Impression & Recommendations:  Problem # 1:  IDDM (ICD-250.01)  Medications Added to Medication List This Visit: 1)  Humalog Mix 75/25 Pen 75-25 % Susp (Insulin lispro prot & lispro) .... 25 units qam and 40 units qpm  Other Orders: Est. Patient Level III ([address])  Patient Instructions: 1)  i advised pump therapy.  he says he will consider. 2)  increase humalog 75/25 to 25 units am and 40 units before evening meal 3)  if exercise is anticipated, subtract 10 units from that shot 4)  ret 6 weeks       Preventive Care  Screening     Patient states never had colonoscopy    Immunization History:  Influenza Immunization History:    Influenza:  historical (05/04/2008)

## 2010-09-03 NOTE — Assessment & Plan Note (Signed)
Summary: per bonnie/njr   Vital Signs:  Patient profile:   60 year old male Height:      76 inches Weight:      220 pounds BMI:     26.88 Temp:     98.2 degrees F oral Pulse rate:   90 / minute Resp:     14 per minute BP sitting:   130 / 77  (left arm)  Vitals Entered By: Willy Eddy, LPN (July 03, 2009 11:23 AM)  Nutrition Counseling: Patient's BMI is greater than 25 and therefore counseled on weight management options. CC: roa- feeoling better   Primary Care Provider:  Stacie Glaze MD  CC:  roa- feeoling better.  History of Present Illness: Follow up of IDDM withreview of Cbg's good morning ratings onely one 200 in 42 days! 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 Take 2 Two Times A Day 2)  Flomax 0.4 Mg Xr24h-Cap (Tamsulosin Hcl) .... Take 1 By Mouth Qd 3)  Prostate  Tabs (Specialty Vitamins Products) .... Take 2 By Mouth Qd 4)  Novolog Mix 70/30 Flexpen 70-30 % Susp (Insulin Aspart Prot & Aspart) .... Use Two Times A Day On Siding Scale Starting 20 Two Times A Day 5)  Onetouch Ultra Test  Strp (Glucose Blood) .... Three Times A Day, and Lancets 150.01 6)  Onetouch Ultra System W/device Kit (Blood Glucose Monitoring Suppl) .... As Dir 7)  Alpha-Lipoic  Acid 50 Mg Caps (Alpha-Lipoic Acid) .[name].. 1 Once Daily 8)  Cialis 20 Mg Tabs (Tadalafil) .... As Needed 9)  Yl Vitamin C-Rose Hips 500 Mg Tabs (Ascorbic Acid) .[name].. 1 Once Daily 10)  Folic Acid 800 Mcg Tabs (Folic Acid) .[name].. 1 Once Daily 11)  Multivitamins  Caps (Multiple Vitamin) .[name].. 1 Once Daily 12)  Lisinopril 5 Mg Tabs (Lisinopril) .... One By Mouth Daily  ( Pt To Pay Cash Price Fro 90 Day Generic, Not Insurance) 13)  Levemir Flexpen 100 Unit/ml Soln (Insulin Detemir) .... 25 Units Daily\par 14)  Vitamin C 1000 Mg Tabs (Ascorbic Acid) .[name].. 1 Once Daily 15)  Azithromycin 250 Mg Tabs (Azithromycin) .... Two Today Then One A Day For 4 Days 16)  Atuss Ds 30-4-30 Mg/60ml Susp (Pseudoephed Hcl-Cpm-Dm Hbr Tan) .... Two Tsp By Mouth Two Times A Day 17)  Allerx Dose Pack  Misc (Pse-Methscop & Cpm-Pe-Methscop) .... Take As Directed  Current Medications (verified): 1)  Metformin Hcl 500 Mg Xr24h-Tab (Metformin Hcl) .... Take 2 Two Times A Day 2)  Flomax 0.4 Mg Xr24h-Cap (Tamsulosin Hcl) .... Take 1 By Mouth Qd 3)  Prostate  Tabs (Specialty Vitamins Products) .... Take 2 By Mouth Qd 4)  Novolog Mix 70/30 Flexpen 70-30 % Susp (Insulin Aspart Prot & Aspart) .... Use Two  Times A Day On Siding Scale Starting 20 Two Times A Day 5)  Onetouch Ultra Test  Strp (Glucose Blood) .... Three Times A Day, and Lancets 150.01 6)  Onetouch Ultra System W/device Kit (Blood Glucose Monitoring Suppl) .... As Dir 7)  Alpha-Lipoic Acid 50 Mg Caps (Alpha-Lipoic Acid) .[name].. 1 Once Daily 8)  Cialis 20 Mg Tabs (Tadalafil) .... As Needed 9)  Yl Vitamin C-Rose Hips 500 Mg Tabs (Ascorbic Acid) .[name].. 1 Once Daily 10)  Folic Acid 800 Mcg Tabs (Folic Acid) .[name].. 1 Once Daily 11)  Multivitamins  Caps (Multiple Vitamin) .[name].. 1 Once Daily 12)  Lisinopril 5 Mg Tabs (Lisinopril) .... One By Mouth Daily  ( Pt To Pay Cash Price Fro 90 Day Generic, Not Insurance) 13)  Levemir Flexpen 100 Unit/ml Soln (Insulin Detemir) .... 25 Units Daily\par  14)  Vitamin C 1000 Mg Tabs (Ascorbic Acid) .[name].. 1 Once Daily  Allergies (verified): 1)  ! Demerol  Past History:  Family History: Last updated: 08/30/2008 mother developed dm ad advanced age dm: half-brother  Social History: Last updated: 08/30/2008 married retired  Risk Factors: Smoking Status: never (07/03/2009)  Past medical, surgical, family and social histories (including risk factors) reviewed, and no changes noted (except as noted below).  Past Medical History: Reviewed history from 08/30/2008 and no changes required. Diabetes Kidney Stone GERD Urinary incontinence  Past Surgical History: Reviewed history from 08/30/2008 and no changes required. Tonsillectomy Kidney Stone removed Back Operations  Family History: Reviewed history from 08/30/2008 and no changes required. mother developed dm ad advanced age dm: half-brother  Social History: Reviewed history from 08/30/2008 and no changes required. married retired  Review of Systems  The patient denies anorexia, fever, weight loss, weight gain, vision loss, decreased hearing, hoarseness, chest pain, syncope, dyspnea on exertion, peripheral edema, prolonged cough, headaches, hemoptysis, abdominal pain, melena, hematochezia, hematuria, incontinence, genital sores, muscle weakness, suspicious skin lesions, transient blindness, difficulty walking, depression, unusual weight change, abnormal bleeding, enlarged lymph nodes, angioedema, breast masses, and testicular masses.    Physical Exam  General:  normal appearance.  well-developed and well-nourished.   Head:  normocephalic and atraumatic.   Eyes:  pupils equal and pupils round.   Ears:  R ear normal and no external deformities.   Nose:  no external deformity and no nasal discharge.   Mouth:  pharyngeal exudate and posterior lymphoid hypertrophy.   Lungs:  normal respiratory effort, no dullness, and no wheezes.   Heart:  normal rate and regular rhythm.     Abdomen:  abdomen is soft, nontender.  no hepatosplenomegaly.   not distended.  no hernia soft, non-tender, and normal bowel sounds.     Impression & Recommendations:  Problem # 1:  HYPERTENSION, BORDERLINE (ICD-401.9) had some caffiene His updated medication list for this problem includes:    Lisinopril 5 Mg Tabs (Lisinopril) ..... One by mouth daily  ( pt to pay cash price fro 90 day generic, not insurance)  BP today: 144/80 Prior BP: 122/70 (06/19/2009)  Labs Reviewed: Chol: 138 (04/25/2009)   HDL: 28.90 (04/25/2009)   LDL: 91 (04/25/2009)   TG: 90.0 (04/25/2009) having some orthostqatic hypotension with standing  Problem # 2:  IDDM (ICD-250.01) Assessment: Deteriorated  His updated medication list for this problem includes:    Metformin Hcl 500 Mg Xr24h-tab (Metformin hcl) .[name]... Take 2 two times a day    Novolog Mix 70/30 Flexpen 70-30 % Susp (Insulin aspart prot & aspart) ..... Use two times a day on  siding scale starting 20 two times a day    Lisinopril 5 Mg Tabs (Lisinopril) ..... One by mouth daily  ( pt to pay cash price fro 90 day generic, not insurance)    Levemir Flexpen 100 Unit/ml Soln (Insulin detemir) .[name]... 25 units daily\  Reviewed HgBA1c results: 7.8 (04/25/2009)  8.9 (11/24/2008)  Orders: Venipuncture (16109) TLB-BMP (Basic Metabolic Panel-BMET) (80048-METABOL) TLB-A1C / Hgb A1C (Glycohemoglobin) (83036-A1C)  Problem # 3:  GERD (ICD-530.81) stable  Complete Medication List: 1)  Metformin Hcl 500 Mg Xr24h-tab (Metformin hcl) .... Take 2 two times a day 2)  Flomax 0.4 Mg Xr24h-cap (Tamsulosin hcl) .... Take 1 by mouth qd 3)  Prostate Tabs (Specialty vitamins products) .... Take 2 by mouth qd 4)  Novolog Mix 70/30 Flexpen 70-30 % Susp (Insulin aspart prot & aspart) .... Use two times a day on siding scale starting 20 two times a day 5)  Onetouch Ultra Test Strp (Glucose blood) .... Three times a day, and lancets 150.01 6)  Onetouch Ultra System W/device  Kit (Blood glucose monitoring suppl) .... As dir 7)  Alpha-lipoic Acid 50 Mg Caps (Alpha-lipoic acid) .[name].. 1 once daily 8)  Cialis 20 Mg Tabs (Tadalafil) .... As needed 9)  Yl Vitamin C-rose Hips 500 Mg Tabs (Ascorbic acid) .[name].. 1 once daily 10)  Folic Acid 800 Mcg Tabs (Folic acid) .[name].. 1 once daily 11)  Multivitamins Caps (Multiple vitamin) .[name].. 1 once daily 12)  Lisinopril 5 Mg Tabs (Lisinopril) .... One by mouth daily  ( pt to pay cash price fro 90 day generic, not insurance) 13)  Levemir Flexpen 100 Unit/ml Soln (Insulin detemir) .... 25 units daily\par 14)  Vitamin C 1000 Mg Tabs (Ascorbic acid) .[name].. 1 once daily  Other Orders: Orthopedic Surgeon Referral (Ortho Surgeon)  Patient Instructions: 1)  Please schedule a follow-up appointment in 2 months. Prescriptions: LEVEMIR FLEXPEN 100 UNIT/ML SOLN (INSULIN DETEMIR) 25 units daily #1pen x 6   Entered by:   Willy Eddy, LPN   Authorized by:   Stacie Glaze MD   Signed by:   Willy Eddy, LPN on 60/45/4098   Method used:   Electronically to        [facility]  [address]* (retail)       [address]       [place], [place]  [address]       Ph: [phone]       Fax: [phone]   RxID:   [id number] LEVEMIR FLEXPEN 100 UNIT/ML SOLN (INSULIN DETEMIR) 25 units daily #1pen x 6   Entered by:   Willy Eddy, LPN   Authorized by:   Stacie Glaze MD   Signed by:   Willy Eddy, LPN on 53/66/4403   Method used:   Electronically to        [facility]* (mail-order)       [address] [place], [place]  [address]       Ph: [phone]       Fax: [phone]   RxID:   [id number] NOVOLOG MIX 70/30 FLEXPEN 70-30 % SUSP (INSULIN ASPART PROT & ASPART) use two times a day on siding scale starting 20 two times a day  #15pen x 3   Entered by:   Willy Eddy, LPN   Authorized by:   Stacie Glaze MD   Signed by:  Willy Eddy, LPN on 16/08/930   Method used:    Electronically to        [facility]* (mail-order)       [address] [place], [place]  [address]       Ph: [phone]       Fax: [phone]   RxID:   [id number]

## 2010-09-03 NOTE — Progress Notes (Signed)
Summary: Pt req to get 90 day supply of Humalog and Levemir to [facility]  Phone Note Call from Patient Call back at [facility] Phone [phone]   Caller: Patient Summary of Call: Pt called req to get script changed to 90 day supply for Humalog and Levemir to [facility] mail order.   Make sure that the script in changed from old 30 day and 50 day, to new 90 day supply on both meds.     Initial call taken by: [name] Antigua,  January 21, 2010 3:46 PM    Prescriptions: LEVEMIR FLEXPEN 100 UNIT/ML SOLN (INSULIN DETEMIR) 25 units daily #6 x 3   Entered by:   Willy Eddy, LPN   Authorized by:   Stacie Glaze MD   Signed by:   Willy Eddy, LPN on 09/81/1914   Method used:   Electronically to        [facility]* (mail-order)       [address] [place], [place]  [address]       Ph: [phone]       Fax: [phone]   RxID:   [id number] HUMALOG MIX 75/25 KWIKPEN 75-25 % SUSP (INSULIN LISPRO PROT & LISPRO) 20 un in AM and 40 units in PM  #10 pens x 3   Entered by:   Willy Eddy, LPN   Authorized by:   Stacie Glaze MD   Signed by:   Willy Eddy, LPN on 40/34/7425   Method used:   Electronically to        [facility]* (mail-order)       [address] [place], [place]  [address]       Ph: [phone]       Fax: [phone]   RxID:   [id number]

## 2010-09-03 NOTE — Assessment & Plan Note (Signed)
Summary: New endo consult/diabetes/uhc/$50/cd   Vital Signs:  Patient Profile:   60 Years Old Male Weight:      244.6 pounds O2 Sat:      94 % O2 treatment:    Room Air Temp:     97.0 degrees F oral Pulse rate:   84 / minute BP sitting:   130 / 84  (left arm) Cuff size:   large  Pt. in pain?   no  Vitals Entered By: Orlan Leavens (August 30, 2008 9:19 AM)              Is Patient Diabetic? Yes Did you bring your meter with you today? No     Referred by:  SELF PCP:  DR. DONALD MOORE  Chief Complaint:  NEW ENDO/ SELF REFERRAL.  History of Present Illness: pt states 11-year h/o dm.  he has been on insulin x 3 years.  he is unaware of any chronic complications. he takes humalog 75/25, 20 units am and 40 units pm.  novolog "did not work for me."  his cbg's range from 100-200.  it is higher in am, and lower before breakfast.  he has hypoglycemia after breakfast with exertion. he says his cbg is higher in am than at hs, even though he seldom eats hs-snack.  he is willing to take only 2 injections per day. symptomatically, pt states moderate weight gain x few mos, but no associated numbness of the feet. he says his diet and exercise are "fair."    Prior Medications Reviewed Using: Medication Bottles  Updated Prior Medication List: METFORMIN HCL 500 MG XR24H-TAB (METFORMIN HCL) TAKE 2 two times a day FLOMAX 0.4 MG XR24H-CAP (TAMSULOSIN HCL) TAKE 1 by mouth QD HUMALOG MIX 75/25 PEN 75-25 % SUSP (INSULIN LISPRO PROT & LISPRO) TAKE 30 UNITS Q AM AND 39 UNITS Q PM PROSTATE  TABS (SPECIALTY VITAMINS PRODUCTS) TAKE 1 by mouth QD * ACTI-FLEX VITAMIN TAKE 1 two times a day * LDL PROTECT TAKE 2 at bedtime  Current Allergies (reviewed today): ! DEMEROL  Past Medical History:    Diabetes    Kidney Stone    GERD    Urinary incontinence  Past Surgical History:    Tonsillectomy    Kidney Stone removed    Back Operations   Family History:    Reviewed history and no changes  required:       mother developed dm ad advanced age       dm: half-brother  Social History:    Reviewed history and no changes required:       married       retired   Risk Factors:  Tobacco use:  never   Review of Systems       denies chest pain, sob, n/v, urinary frequency, cramps, excessive diaphoresis, memory loss, depression, and easy bruising. he has intermittent blurry vision and headache    Physical Exam  General:     healthy appearing.   Head:     head: no deformity eyes: no periorbital swelling, np proptosis mouth: no lesion seen external nose and ears are normal  Neck:     no masses, thyromegaly, or abnormal cervical nodes Lungs:     clear to auscultation.  no respiratory distress  Heart:     regular rate and rhythm, S1, S2 without murmurs, rubs, gallops, or clicks Abdomen:     abdomen is soft, nontender.  no hepatosplenomegaly.   not distended.  no hernia  Msk:  no deformity or scoliosis noted with normal posture and gait Pulses:     dorsalis pedis intact bilat.  no carotid bruit  Extremities:     no deformity.  no ulcer on the feet.  feet are of normal color and temp.  no edema  Neurologic:     cn 2-12 grossly intact.   readily moves all 4's.   sensation is intact to touch on the feet  Skin:     insulin injection sites at anterior abdomen are normal  Cervical Nodes:     no significant adenopathy Psych:     alert and cooperative; normal mood and affect; normal attention span and concentration    Impression & Recommendations:  Problem # 1:  IDDM (ICD-250.01) therapy limited by pt's request for a simple regimen  Problem # 2:  weight gain exogenous  Problem # 3:  blurry vision prob due to #1  Medications Added to Medication List This Visit: 1)  Metformin Hcl 500 Mg Xr24h-tab (Metformin hcl) .... Take 2 two times a day 2)  Flomax 0.4 Mg Xr24h-cap (Tamsulosin hcl) .... Take 1 by mouth qd 3)  Humalog Mix 75/25 Pen 75-25 % Susp  (Insulin lispro prot & lispro) .... Take 30 units q am and 39 units q pm 4)  Prostate Tabs (Specialty vitamins products) .... Take 1 by mouth qd 5)  Acti-flex Vitamin  .... Take 1 two times a day 6)  Ldl Protect  .... Take 2 at bedtime 7)  Humalog Kwikpen 100 Unit/ml Soln (Insulin lispro (human)) .... Qac three times a day) 15-10-15 units 8)  Humulin N Pen 100 Unit/ml Susp (Insulin isophane human) .[name].. 15 units qhs 9)  Humalog Mix 75/25 Pen 75-25 % Susp (Insulin lispro prot & lispro) .... 20 units qam and 35 units qpm   Patient Instructions: 1)  we discussed the importance of diet and exercise therapy and the risk of diabetes 2)  i told pt we will need to take this complex situation in stages 3)  check your blood glucose 1-2 times a day, before the 3 meals and at bedtime.  also check if you feel as though your glucose might be very high or too low.  bring a record of this to your doctor appointments 4)  change humalog 75/25 to 20 units am and 35 units pm 5)  Please schedule a follow-up appointment in 2 weeks. 6)  see eye dr q year   Prescriptions: HUMALOG MIX 75/25 PEN 75-25 % SUSP (INSULIN LISPRO PROT & LISPRO) 20 units qam and 35 units qpm  #2 boxes x 11   Entered and Authorized by:   Minus Breeding MD   Signed by:   Minus Breeding MD on 08/30/2008   Method used:   Print then Give to Patient   RxID:   [id number] HUMALOG KWIKPEN 100 UNIT/ML SOLN (INSULIN LISPRO (HUMAN)) qac three times a day) 15-10-15 units  #1 box x 11   Entered and Authorized by:   Minus Breeding MD   Signed by:   Minus Breeding MD on 08/30/2008   Method used:   Print then Give to Patient   RxID:   [id number] HUMULIN N PEN 100 UNIT/ML SUSP (INSULIN ISOPHANE HUMAN) 15 units qhs  #1 box x 11   Entered and Authorized by:   Minus Breeding MD   Signed by:   Minus Breeding MD on 08/30/2008   Method used:   Print then Give to Patient  RxID:   [id number]    Tetanus/Td Immunization History:     Tetanus/Td # 1:  Historical (08/04/2005)  Pneumovax Immunization History:    Pneumovax # 1:  Historical (08/05/2007)

## 2010-09-03 NOTE — Progress Notes (Signed)
Summary: lancets  Phone Note Call from Patient Call back at [phone]   Summary of Call: Needs Rx for lancets [facility] for One Touch Ultra Mini.  Wrong Rx to [facility], they will return them for him, but need correct one sent to [facility].   Initial call taken by: Rudy Jew, RN,  Dec 11, 2009 11:15 AM    New/Updated Medications: Dola Argyle LANCETS  MISC (LANCETS) 1 three times a day Prescriptions: ONETOUCH DELICA LANCETS  MISC (LANCETS) 1 three times a day  #270 x 6   Entered by:   Willy Eddy, LPN   Authorized by:   Stacie Glaze MD   Signed by:   Willy Eddy, LPN on 25/95/6387   Method used:   Electronically to        [facility]* (mail-order)       [address] [place], [place]  [address]       Ph: [phone]       Fax: [phone]   RxID:   [id number]

## 2010-09-03 NOTE — Progress Notes (Signed)
Summary: pulled muscle?  Phone Note Call from Patient   Caller: Patient Call For: Stacie Glaze MD Summary of Call: Pt. was playing golf yesterday, and pulled muscle between shoulder blades.  Would like Rx called to [facility]). [phone] Initial call taken by: Lynann Beaver CMA,  November 16, 2009 1:38 PM  Follow-up for Phone Call        per dr Lovell Sheehan- may have parafon forte two times a day for 5 days Follow-up by: Willy Eddy, LPN,  November 16, 2009 2:41 PM    New/Updated Medications: PARAFON FORTE DSC 500 MG TABS (CHLORZOXAZONE) one by mouth two times a day x 5 days Prescriptions: PARAFON FORTE DSC 500 MG TABS (CHLORZOXAZONE) one by mouth two times a day x 5 days  #10 x 0   Entered by:   Lynann Beaver CMA   Authorized by:   Stacie Glaze MD   Signed by:   Lynann Beaver CMA on 11/16/2009   Method used:   Electronically to        [facility]  [address]* (retail)       [address]       [place], [place]  [address]       Ph: [phone]       Fax: [phone]   RxID:   [id number]  Pt notified.

## 2010-09-03 NOTE — Assessment & Plan Note (Signed)
Summary: 1 MNTH ROV//SLM   Vital Signs:  Patient profile:   60 year old male Height:      76 inches Weight:      225 pounds BMI:     27.49 Temp:     98.2 degrees F oral Pulse rate:   80 / minute Resp:     14 per minute BP sitting:   140 / 80  (left arm)  Vitals Entered By: Willy Eddy, LPN (May 02, 2009 1:34 PM)  Nutrition Counseling: Patient's BMI is greater than 25 and therefore counseled on weight management options.  Primary Care Provider:  DR. DONALD MOORE  CC:  roa-labs.  History of Present Illness:  Type 1 Diabetes Mellitus Follow-Up      This is a 60 year old man who presents for Type 1 diabetes mellitus follow-up.  The patient denies polyuria, polydipsia, blurred vision, self managed hypoglycemia, hypoglycemia requiring help, weight loss, weight gain, and numbness of extremities.  The patient denies the following symptoms: neuropathic pain, chest pain, vomiting, orthostatic symptoms, poor wound healing, intermittent claudication, vision loss, and foot ulcer.  Since the last visit the patient reports good dietary compliance.  The patient has been measuring capillary blood glucose before breakfast and before dinner.  Since the last visit, the patient reports having had eye care by an ophthalmologist.    Problems Prior to Update: 1)  Hypertension, Borderline  (ICD-401.9) 2)  Iddm  (ICD-250.01) 3)  Urinary Calculus  (ICD-592.9) 4)  Uti  (ICD-599.0) 5)  Chickenpox, Hx of  (ICD-V15.9) 6)  Arthritis  (ICD-716.90) 7)  Urinary Incontinence  (ICD-788.30) 8)  Gerd  (ICD-530.81)  Medications Prior to Update: 1)  Metformin Hcl 500 Mg Xr24h-Tab (Metformin Hcl) .... Take 2 Two Times A Day 2)  Flomax 0.4 Mg Xr24h-Cap (Tamsulosin Hcl) .... Take 1 By Mouth Qd 3)  Prostate  Tabs (Specialty Vitamins Products) .... Take 2 By Mouth Qd 4)  Humalog Mix 75/25 Pen 75-25 % Susp (Insulin Lispro Prot & Lispro) .[name].. 15 Units Qam and 30 Units Qpm 5)  Onetouch Ultra Test  Strp (Glucose  Blood) .... Three Times A Day, and Lancets 150.01 6)  Onetouch Ultra System W/device Kit (Blood Glucose Monitoring Suppl) .... As Dir 7)  Alpha-Lipoic Acid 50 Mg Caps (Alpha-Lipoic Acid) .[name].. 1 Once Daily 8)  Cialis 20 Mg Tabs (Tadalafil) .... As Needed 9)  Yl Vitamin C-Rose Hips 500 Mg Tabs (Ascorbic Acid) .[name].. 1 Once Daily 10)  Folic Acid 800 Mcg Tabs (Folic Acid) .[name].. 1 Once Daily 11)  Multivitamins  Caps (Multiple Vitamin) .[name].. 1 Once Daily 12)  Lisinopril 5 Mg Tabs (Lisinopril) .... One By Mouth Daily  Current Medications (verified): 1)  Metformin Hcl 500 Mg Xr24h-Tab (Metformin Hcl) .... Take 2 Two Times A Day 2)  Flomax 0.4 Mg Xr24h-Cap (Tamsulosin Hcl) .... Take 1 By Mouth Qd 3)  Prostate  Tabs (Specialty Vitamins Products) .... Take 2 By Mouth Qd 4)  Humalog Mix 75/25 Pen 75-25 % Susp (Insulin Lispro Prot & Lispro) .[name].. 10 Units Qam and 20 Units Qpm 5)  Onetouch Ultra Test  Strp (Glucose Blood) .... Three Times A Day, and Lancets 150.01 6)  Onetouch Ultra System W/device Kit (Blood Glucose Monitoring Suppl) .... As Dir 7)  Alpha-Lipoic Acid 50 Mg Caps (Alpha-Lipoic Acid) .[name].. 1 Once Daily 8)  Cialis 20 Mg Tabs (Tadalafil) .... As Needed 9)  Yl Vitamin C-Rose Hips 500 Mg Tabs (Ascorbic Acid) .[name].. 1 Once Daily 10)  Folic Acid 800 Mcg Tabs (Folic Acid) .[name].. 1 Once Daily 11)  Multivitamins  Caps (Multiple Vitamin) .[name].. 1 Once Daily 12)  Lisinopril 5 Mg Tabs (Lisinopril) .... One By Mouth Daily  ( Pt To Pay Cash Price Fro 90 Day Generic, Not Insurance) 13)  Levemir 100 Unit/ml Soln (Insulin Detemir) .[name].. 15 U Subcutaneously  At Bedtim  Allergies (verified): 1)  ! Demerol  Past History:  Family History: Last updated: 08/30/2008 mother developed dm ad advanced age dm: half-brother  Social History: Last updated: 08/30/2008 married retired  Risk Factors: Smoking Status: never (08/30/2008)  Past medical, surgical, family and social histories (including risk factors) reviewed,  and no changes noted (except as noted below).  Past Medical History: Reviewed history from 08/30/2008 and no changes required. Diabetes Kidney Stone GERD Urinary incontinence  Past Surgical History: Reviewed history from 08/30/2008 and no changes required. Tonsillectomy Kidney Stone removed Back Operations  Family History: Reviewed history from 08/30/2008 and no changes required. mother developed dm ad advanced age dm: half-brother  Social History: Reviewed history from 08/30/2008 and no changes required. married retired  Review of Systems  The patient denies anorexia, fever, weight loss, weight gain, vision loss, decreased hearing, hoarseness, chest pain, syncope, dyspnea on exertion, peripheral edema, prolonged cough, headaches, hemoptysis, abdominal pain, melena, hematochezia, severe indigestion/heartburn, hematuria, incontinence, genital sores, muscle weakness, suspicious skin lesions, transient blindness, difficulty walking, depression, unusual weight change, abnormal bleeding, enlarged lymph nodes, angioedema, breast masses, and testicular masses.         Flu Vaccine Consent Questions     Do you have a history of severe allergic reactions to this vaccine? no    Any prior history of allergic reactions to egg and/or gelatin? no    Do you have a sensitivity to the preservative Thimersol? no    Do you have a past history of Guillan-Barre Syndrome? no    Do you currently have an acute febrile illness? no    Have you ever had a severe reaction to latex? no    Vaccine information given and explained to patient? yes    Are you currently pregnant? no    Lot Number:[id number]   Exp Date:01/31/2010   Site Given  Left Deltoid IM      Physical Exam  General:  normal appearance.   Head:  normocephalic and atraumatic.   Eyes:  pupils equal and pupils round.   Ears:  R ear normal and L ear normal.   Nose:  no external deformity and no nasal discharge.   Mouth:  good  dentition and pharynx pink and moist.   Neck:  supple.   Lungs:  normal respiratory effort and no wheezes.   Heart:  normal rate and regular rhythm.   Abdomen:  abdomen is soft, nontender.  no hepatosplenomegaly.   not distended.  no hernia soft, non-tender, and normal bowel sounds.   Msk:  no deformity or scoliosis noted with normal posture and gait Pulses:  dorsalis pedis intact bilat.   Extremities:  no deformity.  no ulcer on the feet.  feet are of normal color and temp.  no edema    Impression & Recommendations:  Problem # 1:  HYPERTENSION, BORDERLINE (ICD-401.9) Assessment Deteriorated  His updated medication list for this problem includes:    Lisinopril 5 Mg Tabs (Lisinopril) ..... One by mouth daily  ( pt to pay cash price fro 90 day generic, not insurance)  BP today: 140/80 Prior BP: 160/90 (03/27/2009)  Labs Reviewed:  Chol: 138 (04/25/2009)   HDL: 28.90 (04/25/2009)   LDL: 91 (04/25/2009)   TG: 90.0 (04/25/2009)  Problem # 2:  IDDM (ICD-250.01) Assessment: Improved  His updated medication list for this problem includes:    Metformin Hcl 500 Mg Xr24h-tab (Metformin hcl) .[name]... Take 2 two times a day    Humalog Mix 75/25 Pen 75-25 % Susp (Insulin lispro prot & lispro) .Marland KitchenMarland KitchenMarland KitchenMarland Kitchen 10 units qam and 20 units qpm    Lisinopril 5 Mg Tabs (Lisinopril) ..... One by mouth daily  ( pt to pay cash price fro 90 day generic, not insurance)    Levemir 100 Unit/ml Soln (Insulin detemir) .Marland KitchenMarland KitchenMarland KitchenMarland Kitchen 15 u subcutaneously  at bedtim  Reviewed HgBA1c results: 7.8 (04/25/2009)  8.9 (11/24/2008)  Problem # 3:  GERD (ICD-530.81) stable  Complete Medication List: 1)  Metformin Hcl 500 Mg Xr24h-tab (Metformin hcl) .... Take 2 two times a day 2)  Flomax 0.4 Mg Xr24h-cap (Tamsulosin hcl) .... Take 1 by mouth qd 3)  Prostate Tabs (Specialty vitamins products) .... Take 2 by mouth qd 4)  Humalog Mix 75/25 Pen 75-25 % Susp (Insulin lispro prot & lispro) .[name].. 10 units qam and 20 units qpm 5)  Onetouch  Ultra Test Strp (Glucose blood) .... Three times a day, and lancets 150.01 6)  Onetouch Ultra System W/device Kit (Blood glucose monitoring suppl) .... As dir 7)  Alpha-lipoic Acid 50 Mg Caps (Alpha-lipoic acid) .[name].. 1 once daily 8)  Cialis 20 Mg Tabs (Tadalafil) .... As needed 9)  Yl Vitamin C-rose Hips 500 Mg Tabs (Ascorbic acid) .[name].. 1 once daily 10)  Folic Acid 800 Mcg Tabs (Folic acid) .[name].. 1 once daily 11)  Multivitamins Caps (Multiple vitamin) .[name].. 1 once daily 12)  Lisinopril 5 Mg Tabs (Lisinopril) .... One by mouth daily  ( pt to pay cash price fro 90 day generic, not insurance) 13)  Levemir 100 Unit/ml Soln (Insulin detemir) .[name].. 15 u subcutaneously  at bedtim  Other Orders: Admin 1st Vaccine (16109) Flu Vaccine 69yrs + (60454)  Patient Instructions: 1)  reduce the 70/30 to 10 u before breakfast and 20 u before dinner 2)  adding 15 unt of long acting at bed time 3)  if the glucose remain in the 200 range in the AM 4)  then two changes 5)  1. reduce the dinner 70/30 by 5 units 6)  2. increase the levemir by 10 units 7)  Please schedule a follow-up appointment in 1 month. Prescriptions: LISINOPRIL 5 MG TABS (LISINOPRIL) one by mouth daily  ( pt to pay cash price fro 90 day generic, not insurance)  #90 x 3   Entered and Authorized by:   Stacie Glaze MD   Signed by:   Stacie Glaze MD on 05/02/2009   Method used:   Electronically to        [facility]  [address] [facility] (retail)       [address]       [place], [place]  [address]       Ph: [phone]       Fax: [phone]   RxID:   [id number]

## 2010-09-03 NOTE — Progress Notes (Signed)
  Phone Note Outgoing Call   Summary of Call: Left a message with pts spouse to return my call. MD states pt is due for a follow up appt. Initial call taken by: Josph Macho RMA,  November 16, 2009 9:29 AM

## 2010-09-03 NOTE — Progress Notes (Signed)
Summary: wants relief  Phone Note Call from Patient Call back at Home Phone [phone]   Caller: Patient--triage vm Reason for Call: Acute Illness Summary of Call: wants to s/w [place]. has congestion and cannot sleep. requesting something to be called in. Initial call taken by: Warnell Forester,  June 24, 2010 11:36 AM  Follow-up for Phone Call        per dr Lovell Sheehan- may have z pack and use delsym otc Follow-up by: Willy Eddy, LPN,  June 24, 2010 12:01 PM    New/Updated Medications: ZITHROMAX Z-PAK 250 MG TABS (AZITHROMYCIN) take as directed Prescriptions: ZITHROMAX Z-PAK 250 MG TABS (AZITHROMYCIN) take as directed  #1 x 0   Entered by:   Willy Eddy, LPN   Authorized by:   Stacie Glaze MD   Signed by:   Willy Eddy, LPN on 47/82/9562   Method used:   Electronically to        [facility]  [address] (retail)       [address]       [place], [place]  [address]       Ph: [phone]       Fax: [phone]   RxID:   [id number]

## 2010-09-03 NOTE — Assessment & Plan Note (Signed)
Summary: 2 month fup//ccm   Vital Signs:  Patient profile:   60 year old male Weight:      232 pounds BMI:     28.34 Pulse rate:   68 / minute Pulse rhythm:   regular BP sitting:   130 / 80  (left arm) Cuff size:   regular  Vitals Entered By: Raechel Ache, RN (February 15, 2010 8:02 AM) CC: 2 mo ROV, brought BS readings.   Primary Care Provider:  Stacie Glaze MD  CC:  2 mo ROV and brought BS readings..  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 Take 2 Two Times A Day 2)  Flomax 0.4 Mg Xr24h-Cap (Tamsulosin Hcl) .... Take 1 By Mouth Qd 3)  Prostate  Tabs (Specialty Vitamins Products) .... Take 2 By Mouth Qd 4)  Humalog Mix 75/25 Kwikpen 75-25 % Susp (Insulin Lispro Prot & Lispro) .... 20 Un in Am and 40 Units in Pm 5)  Onetouch Ultra Test  Strp (Glucose Blood) .... Three Times A Day, and Lancets 150.01 6)  Onetouch Delica Lancets  Misc (Lancets) .[name].. 1 Three Times A Day 7)  Alpha-Lipoic Acid 50 Mg Caps (Alpha-Lipoic Acid) .[name].. 1 Once Daily 8)  Cialis 20 Mg Tabs (Tadalafil)  .... As Needed 9)  Yl Vitamin C-Rose Hips 500 Mg Tabs (Ascorbic Acid) .[name].. 1 Once Daily 10)  Folic Acid 800 Mcg Tabs (Folic Acid) .[name].. 1 Once Daily 11)  Multivitamins  Caps (Multiple Vitamin) .[name].. 1 Once Daily 12)  Lisinopril 5 Mg Tabs (Lisinopril) .... One By Mouth Daily  ( Pt To Pay Cash Price Fro 90 Day Generic, Not Insurance) 13)  Levemir Flexpen 100 Unit/ml Soln (Insulin Detemir) .... 25 Units Daily\par 14)  Vitamin C 1000 Mg Tabs (Ascorbic Acid) .[name].. 1 Once Daily 15)  Cosamin Ds 500-400 Mg Caps (Glucosamine-Chondroitin) .[name].. 1 Once Daily 16)  Glucosamine 500 Mg Caps (Glucosamine Sulfate) .[name].. 1 Once Daily 17)  Glucosamine-Chondroitin 500-400 Mg Caps (Glucosamine-Chondroitin) .[name].. 1 Once Daily  Current Medications (verified): 1)  Metformin Hcl 500 Mg Xr24h-Tab (Metformin Hcl) .... Take 2 Two Times A Day 2)  Flomax 0.4 Mg Xr24h-Cap (Tamsulosin Hcl) .... Take 1 By Mouth Qd 3)  Humalog Mix 75/25 Kwikpen 75-25 % Susp (Insulin Lispro Prot & Lispro) .... 8un in Am and 20 Units in Pm 4)  Onetouch Ultra Test  Strp (Glucose Blood) .... Three Times A Day, and Lancets 150.01 5)  Onetouch Delica Lancets  Misc (Lancets) .[name].. 1 Three Times A Day 6)  Alpha-Lipoic  Acid 50 Mg Caps (Alpha-Lipoic Acid) .[name].. 1 Once Daily 7)  Cialis 20 Mg Tabs (Tadalafil) .... As Needed 8)  Yl Vitamin C-Rose Hips 500 Mg Tabs (Ascorbic Acid) .[name].. 1 Once Daily 9)  Folic Acid 800 Mcg Tabs (Folic Acid) .[name].. 1 Once Daily 10)  Multivitamins  Caps (Multiple Vitamin) .[name].. 1 Once Daily 11)  Lisinopril 5 Mg Tabs (Lisinopril) .... One By Mouth Daily  ( Pt To Pay Cash Price Fro 90 Day Generic, Not Insurance) 12)  Levemir Flexpen 100 Unit/ml Soln (Insulin Detemir) .... 35 Units Daily\par 13)  Vitamin C 1000 Mg Tabs (Ascorbic Acid) .[name].. 1 Once Daily 14)  Cosamin Ds 500-400 Mg Caps (Glucosamine-Chondroitin) .[name].. 1 Once Daily 15)  Glucosamine 500 Mg Caps (Glucosamine Sulfate) .[name].. 1 Once Daily 16)  Glucosamine-Chondroitin 500-400 Mg Caps  (Glucosamine-Chondroitin) .[name].. 1 Once Daily 17)  Etodolac 400 Mg Tabs (Etodolac) .[name].. 1 Two Times A Day 18)  Nutraburst Food Supplement .... Two Times A Day  Allergies (verified): 1)  ! Demerol  Past History:  Family History: Last updated: 08/30/2008 mother developed dm ad advanced age dm: half-brother  Social History: Last updated: 08/30/2008 married retired  Risk Factors: Exercise: yes (09/04/2009)  Risk Factors: Smoking Status: never (12/03/2009)  Past medical, surgical, family and social histories (including risk factors) reviewed, and no changes noted (except as noted below).  Past Medical History: Reviewed history from 08/30/2008 and no changes required. Diabetes Kidney Stone GERD Urinary incontinence  Past Surgical History: Reviewed history from 08/30/2008 and no changes required. Tonsillectomy Kidney Stone removed Back Operations  Family History: Reviewed history from 08/30/2008 and no changes required. mother developed dm ad advanced age dm: half-brother  Social History: Reviewed history from 08/30/2008 and no changes required. married retired  Review of Systems  The patient denies anorexia, fever, weight loss, weight gain, vision loss, decreased hearing, hoarseness, chest pain, syncope, dyspnea on exertion, peripheral edema, prolonged cough, headaches, hemoptysis, abdominal pain, melena, hematochezia, severe indigestion/heartburn, hematuria, incontinence, genital sores, muscle weakness, suspicious skin lesions, transient blindness, difficulty walking, depression, unusual weight change, abnormal bleeding, enlarged lymph nodes, angioedema, breast masses, and testicular masses.    Physical Exam  General:  normal appearance.  well-developed and well-nourished.   Head:  normocephalic and atraumatic.   Eyes:  pupils equal and pupils round.   Ears:  R ear normal and no external deformities.   Nose:  no external deformity and no nasal discharge.   Mouth:   pharyngeal exudate and posterior lymphoid hypertrophy.   Neck:  supple.   Lungs:  normal respiratory effort, no dullness, and no wheezes.   Abdomen:  abdomen is soft, nontender.  no hepatosplenomegaly.   not distended.  no hernia soft, non-tender, and normal bowel sounds.   Msk:  normal ROM and no joint tenderness.   Extremities:  no deformity.  no ulcer on the feet.  feet are of normal color and temp.  no edema   Diabetes Management Exam:    Foot Exam (with socks and/or shoes not present):       Sensory-Pinprick/Light touch:          Left medial foot (L-4): diminished          Left dorsal foot (L-5): diminished          Left lateral foot (S-1): diminished          Right medial foot (L-4): diminished          Right dorsal foot (L-5): diminished  Right lateral foot (S-1): diminished       Sensory-Monofilament:          Left foot: diminished          Right foot: diminished   Impression & Recommendations:  Problem # 1:  HYPERTENSION, BORDERLINE (ICD-401.9)  His updated medication list for this problem includes:    Lisinopril 5 Mg Tabs (Lisinopril) ..... One by mouth daily  ( pt to pay cash price fro 90 day generic, not insurance)  BP today: 140/90 Prior BP: 146/84 (12/03/2009)  Prior 10 Yr Risk Heart Disease: 22 % (12/03/2009)  Labs Reviewed: K+: 4.9 (12/03/2009) Creat: : 0.8 (12/03/2009)   Chol: 138 (04/25/2009)   HDL: 28.90 (04/25/2009)   LDL: 91 (04/25/2009)   TG: 90.0 (04/25/2009)  Orders: TLB-BMP (Basic Metabolic Panel-BMET) (80048-METABOL)  Problem # 2:  HYPERLIPIDEMIA, WITH LOW HDL (ICD-272.4)  Labs Reviewed: SGOT: 21 (04/25/2009)   SGPT: 24 (04/25/2009)  Prior 10 Yr Risk Heart Disease: 22 % (12/03/2009)   HDL:28.90 (04/25/2009)  LDL:91 (04/25/2009)  Chol:138 (04/25/2009)  Trig:90.0 (04/25/2009)  Orders: TLB-Cholesterol, HDL (83718-HDL) TLB-Cholesterol, Direct LDL (83721-DIRLDL) TLB-Cholesterol, Total (82465-CHO)  Problem # 3:  IDDM  (ICD-250.01)  His updated medication list for this problem includes:    Metformin Hcl 500 Mg Xr24h-tab (Metformin hcl) .[name]... Take 2 two times a day    Humalog Mix 75/25 Kwikpen 75-25 % Susp (Insulin lispro prot & lispro) .[name]... 8un in am and 20 units in pm    Lisinopril 5 Mg Tabs (Lisinopril) ..... One by mouth daily  ( pt to pay cash price fro 90 day generic, not insurance)    Levemir Flexpen 100 Unit/ml Soln (Insulin detemir) .Marland KitchenMarland KitchenMarland KitchenMarland Kitchen 35 units daily\  Labs Reviewed: Creat: 0.8 (12/03/2009)     Last Eye Exam: normal (11/21/2009) Reviewed HgBA1c results: 7.9 (12/03/2009)  8.1 (09/04/2009)  Orders: Venipuncture (16109) TLB-A1C / Hgb A1C (Glycohemoglobin) (83036-A1C)  Problem # 4:  URINARY CALCULUS (ICD-592.9) three per [place] at [place] office occasional pain  Complete Medication List: 1)  Metformin Hcl 500 Mg Xr24h-tab (Metformin hcl) .... Take 2 two times a day 2)  Flomax 0.4 Mg Xr24h-cap (Tamsulosin hcl) .... Take 1 by mouth qd 3)  Humalog Mix 75/25 Kwikpen 75-25 % Susp (Insulin lispro prot & lispro) .... 8un in am and 20 units in pm 4)  Onetouch Ultra Test Strp (Glucose blood) .... Three times a day, and lancets 150.01 5)  Onetouch Delica Lancets Misc (Lancets) .[name].. 1 three times a day 6)  Alpha-lipoic Acid 50 Mg Caps (Alpha-lipoic acid) .[name].. 1 once daily 7)  Cialis 20 Mg Tabs (Tadalafil) .... As needed 8)  Yl Vitamin C-rose Hips 500 Mg Tabs (Ascorbic acid) .[name].. 1 once daily 9)  Folic Acid 800 Mcg Tabs (Folic acid) .[name].. 1 once daily 10)  Multivitamins Caps (Multiple vitamin) .[name].. 1 once daily 11)  Lisinopril 5 Mg Tabs (Lisinopril) .... One by mouth daily  ( pt to pay cash price fro 90 day generic, not insurance) 12)  Levemir Flexpen 100 Unit/ml Soln (Insulin detemir) .... 35 units daily\par 13)  Vitamin C 1000 Mg Tabs (Ascorbic acid) .[name].. 1 once daily 14)  Cosamin Ds 500-400 Mg Caps (Glucosamine-chondroitin) .[name].. 1 once daily 15)  Glucosamine-chondroitin 500-400 Mg Caps  (Glucosamine-chondroitin) .[name].. 1 once daily 16)  Etodolac 400 Mg Tabs (Etodolac) .[name].. 1 two times a day 17)  Nutraburst Food Supplement  .... Two times a day  Patient Instructions: 1)  Please schedule a follow-up appointment in 3  months.

## 2010-09-03 NOTE — Letter (Signed)
Summary: [facility]  [facility]   Imported By: Maryln Gottron 01/23/2010 12:42:37  _____________________________________________________________________  External Attachment:    Type:   Image     Comment:   External Document

## 2010-09-05 NOTE — Assessment & Plan Note (Signed)
Summary: 3 MNTH ROV//SLM   Vital Signs:  Patient profile:   60 year old male Height:      76 inches Weight:      232 pounds Temp:     98.2 degrees F oral Pulse rate:   72 / minute Resp:     14 per minute BP sitting:   122 / 70  (left arm)  Vitals Entered By: Willy Eddy, LPN (August 26, 2010 8:12 AM) CC: roa, Hypertension Management Is Patient Diabetic? Yes Did you bring your meter with you today? No   Primary Care Provider:  Stacie Glaze MD  CC:  roa and Hypertension Management.  History of Present Illness: The pts AM glucoses have been well controlled and the noon readings are good... the high readings generally occur in the evening. Has not been exercizing... The key to the evening glucoses is exercize in the evening. Relays an episode last week ( friday) with dizzyspells lasting all day... resolved the next day. Feels "popping in neck" with some pain. Has noted increased stuffyness and the probable diagnosis is inner ear The patient has cut back on the etodolac.  Hypertension History:      He denies headache, chest pain, palpitations, dyspnea with exertion, orthopnea, PND, peripheral edema, visual symptoms, neurologic problems, syncope, and side effects from treatment.        Positive major cardiovascular risk factors include male age 33 years old or older, diabetes, hyperlipidemia, and hypertension.  Negative major cardiovascular risk factors include non-tobacco-user status.     Preventive Screening-Counseling & Management  Alcohol-Tobacco     Smoking Status: never     Tobacco Counseling: not indicated; no tobacco use  Problems Prior to Update: 1)  Cerumen Impaction, Bilateral  (ICD-380.4) 2)  Dyspnea  (ICD-786.05) 3)  Back Pain, Acute  (ICD-724.5) 4)  Osteoarthritis, Carpometacarpal Joint, Right Thumb  (ICD-715.94) 5)  Other Enthesopathy of Ankle and Tarsus  (ICD-726.79) 6)  Pneumonia, Left Lower Lobe  (ICD-481) 7)  Hyperlipidemia, With Low Hdl   (ICD-272.4) 8)  Hypertension, Borderline  (ICD-401.9) 9)  Iddm  (ICD-250.01) 10)  Urinary Calculus  (ICD-592.9) 11)  Uti  (ICD-599.0) 12)  Chickenpox, Hx of  (ICD-V15.9) 13)  Arthritis  (ICD-716.90) 14)  Urinary Incontinence  (ICD-788.30) 15)  Gerd  (ICD-530.81)  Current Problems (verified): 1)  Cerumen Impaction, Bilateral  (ICD-380.4) 2)  Dyspnea  (ICD-786.05) 3)  Back Pain, Acute  (ICD-724.5) 4)  Osteoarthritis, Carpometacarpal Joint, Right Thumb  (ICD-715.94) 5)  Other Enthesopathy of Ankle and Tarsus  (ICD-726.79) 6)  Pneumonia, Left Lower Lobe  (ICD-481) 7)  Hyperlipidemia, With Low Hdl  (ICD-272.4) 8)  Hypertension, Borderline  (ICD-401.9) 9)  Iddm  (ICD-250.01) 10)  Urinary Calculus  (ICD-592.9) 11)  Uti  (ICD-599.0) 12)  Chickenpox, Hx of  (ICD-V15.9) 13)  Arthritis  (ICD-716.90) 14)  Urinary Incontinence  (ICD-788.30) 15)  Gerd  (ICD-530.81)  Medications Prior to Update: 1)  Metformin Hcl 500 Mg Xr24h-Tab (Metformin Hcl) .... Take 2 Two Times A Day 2)  Flomax 0.4 Mg Xr24h-Cap (Tamsulosin Hcl) .... Take 1 By Mouth Qd 3)  Humalog Mix 75/25 Kwikpen 75-25 % Susp (Insulin Lispro Prot & Lispro) .[name].. 10-15 Units in An Adn 20-25 Untis in Pm 4)  Onetouch Ultra Test  Strp (Glucose Blood) .... Three Times A Day, and Lancets 150.01 5)  Onetouch Delica Lancets  Misc (Lancets) .[name].. 1 Three Times A Day 6)  Alpha-Lipoic Acid 50 Mg Caps (Alpha-Lipoic Acid) .[name].. 1 Once  Daily 7)  Cialis 20 Mg Tabs (Tadalafil) .... As Needed 8)  Yl Vitamin C-Rose Hips 500 Mg Tabs (Ascorbic Acid) .[name].. 1 Once Daily 9)  Folic Acid 800 Mcg Tabs (Folic Acid) .[name].. 1 Once Daily 10)  Multivitamins  Caps (Multiple Vitamin) .[name].. 1 Once Daily 11)  Lisinopril 10 Mg Tabs (Lisinopril) .[name].. 1 Once Daily 12)  Levemir Flexpen 100 Unit/ml Soln (Insulin Detemir) .... Take As Directed 13)  Etodolac 400 Mg Tabs (Etodolac) .[name].. 1 Two Times A Day 14)  Nutraburst Food Supplement .... Two Times A Day  Current Medications  (verified): 1)  Metformin Hcl 500 Mg Xr24h-Tab (Metformin Hcl) .... Take 2 Two Times A Day 2)  Flomax 0.4 Mg Xr24h-Cap (Tamsulosin Hcl) .... Take 1 By Mouth Qd 3)  Humalog Mix 75/25 Kwikpen 75-25 % Susp (Insulin Lispro Prot & Lispro) .[name].. 15 Units in An and 35  Untis in Pm 4)  Onetouch Ultra Test  Strp (Glucose Blood) .... Three Times A Day, and Lancets 150.01 5)  Onetouch Delica Lancets  Misc (Lancets) .[name].. 1 Three Times A Day 6)  Alpha-Lipoic Acid 50 Mg Caps (Alpha-Lipoic Acid) .[name].. 1 Once Daily 7)  Cialis 20 Mg Tabs (Tadalafil) .... As Needed 8)  Yl Vitamin C-Rose Hips 500 Mg Tabs (Ascorbic Acid) .[name].. 1 Once Daily 9)  Folic Acid 800 Mcg Tabs (Folic Acid) .[name].. 1 Once Daily 10)  Multivitamins  Caps (Multiple Vitamin) .[name].. 1 Once Daily 11)  Lisinopril 10 Mg Tabs (Lisinopril) .[name].. 1 Once Daily 12)  Levemir Flexpen 100 Unit/ml Soln (Insulin Detemir) .... Take As Directed 13)  Etodolac 400 Mg Tabs (Etodolac) .[name].. 1 Two Times A Day 14)  Nutraburst Food Supplement .... Two Times A Day 15)  Glucosamine 500 Mg Caps (Glucosamine Sulfate) .[name].. 1 Once Daily  Allergies (verified): 1)  ! Demerol  Past History:  Family History: Last updated: 08/30/2008 mother developed dm ad advanced age dm: half-brother  Social History: Last updated: 08/30/2008 married retired  Risk Factors: Exercise: yes (09/04/2009)  Risk Factors: Smoking Status: never (08/26/2010)  Past medical, surgical, family and social histories (including risk factors) reviewed, and no changes noted (except as noted below).  Past Medical History: Reviewed history from 08/30/2008 and no changes required. Diabetes Kidney Stone GERD Urinary incontinence  Past Surgical History: Reviewed history from 08/30/2008 and no changes required. Tonsillectomy Kidney Stone removed Back Operations  Family History: Reviewed history from 08/30/2008 and no changes required. mother developed dm ad advanced age dm:  half-brother  Social History: Reviewed history from 08/30/2008 and no changes required. married retired  Review of Systems  The patient denies anorexia, fever, weight loss, weight gain, vision loss, decreased hearing, hoarseness, chest pain, syncope, dyspnea on exertion, peripheral edema, prolonged cough, headaches, hemoptysis, abdominal pain, melena, hematochezia, severe indigestion/heartburn, hematuria, incontinence, genital sores, muscle weakness, suspicious skin lesions, transient blindness, difficulty walking, depression, unusual weight change, abnormal bleeding, enlarged lymph nodes, angioedema, and breast masses.    Physical Exam  General:  normal appearance.  well-developed and well-nourished.   Head:  normocephalic and atraumatic.   Eyes:  pupils equal and pupils round.   Nose:  no external deformity and no nasal discharge.   Mouth:  pharyngeal exudate and posterior lymphoid hypertrophy.   Neck:  supple.   Lungs:  normal respiratory effort, no dullness, and no wheezes.   Heart:  normal rate and regular rhythm.   Abdomen:  abdomen is soft, nontender.  no hepatosplenomegaly.   not distended.  no  hernia soft, non-tender, and normal bowel sounds.   Msk:  normal ROM and no joint tenderness.   Extremities:  no deformity.  no ulcer on the feet.  feet are of normal color and temp.  no edema  Neurologic:  alert & oriented X3, gait normal, and DTRs symmetrical and normal.    Diabetes Management Exam:    Foot Exam (with socks and/or shoes not present):       Sensory-Pinprick/Light touch:          Left medial foot (L-4): normal          Left dorsal foot (L-5): normal          Left lateral foot (S-1): normal          Right medial foot (L-4): normal          Right dorsal foot (L-5): normal          Right lateral foot (S-1): normal       Sensory-Monofilament:          Left foot: normal          Right foot: normal       Inspection:          Left foot: normal          Right foot:  normal    Eye Exam:       Eye Exam done elsewhere   Impression & Recommendations:  Problem # 1:  HYPERLIPIDEMIA, WITH LOW HDL (ICD-272.4) Assessment Deteriorated reviewed labs Labs Reviewed: SGOT: 21 (04/25/2009)   SGPT: 24 (04/25/2009)  10 Yr Risk Heart Disease: 14 % Prior 10 Yr Risk Heart Disease: 22 % (12/03/2009)   HDL:33.60 (05/17/2010), 32.60 (02/15/2010)  LDL:91 (04/25/2009)  Chol:168 (05/17/2010), 174 (02/15/2010)  Trig:90.0 (04/25/2009)  Problem # 2:  HYPERTENSION, BORDERLINE (ICD-401.9) Assessment: Improved stablke His updated medication list for this problem includes:    Lisinopril 10 Mg Tabs (Lisinopril) .[name]... 1 once daily  BP today: 122/70 Prior BP: 130/76 (05/17/2010)  10 Yr Risk Heart Disease: 14 % Prior 10 Yr Risk Heart Disease: 22 % (12/03/2009)  Labs Reviewed: K+: 4.3 (05/17/2010) Creat: : 0.8 (05/17/2010)   Chol: 168 (05/17/2010)   HDL: 33.60 (05/17/2010)   LDL: 91 (04/25/2009)   TG: 90.0 (04/25/2009)  Problem # 3:  IDDM (ICD-250.01)  due A1C His updated medication list for this problem includes:    Metformin Hcl 500 Mg Xr24h-tab (Metformin hcl) .[name]... Take 2 two times a day    Humalog Mix 75/25 Kwikpen 75-25 % Susp (Insulin lispro prot & lispro) .Marland KitchenMarland KitchenMarland KitchenMarland Kitchen 15 units in an and 35  untis in pm    Lisinopril 10 Mg Tabs (Lisinopril) .[name]... 1 once daily    Levemir Flexpen 100 Unit/ml Soln (Insulin detemir) .[name]... Take as directed  Labs Reviewed: Creat: 0.8 (05/17/2010)     Last Eye Exam: normal (11/21/2009) Reviewed HgBA1c results: 8.9 (05/17/2010)  8.1 (02/15/2010)  Orders: TLB-A1C / Hgb A1C (Glycohemoglobin) (83036-A1C) TLB-Microalbumin/Creat Ratio, Urine (82043-MALB)  Problem # 4:  ARTHRITIS (ICD-716.90)  reduced the use of etodolac  Orders: Venipuncture (04540) TLB-CBC Platelet - w/Differential (85025-CBCD)  Complete Medication List: 1)  Metformin Hcl 500 Mg Xr24h-tab (Metformin hcl) .... Take 2 two times a day 2)  Flomax 0.4 Mg Xr24h-cap  (Tamsulosin hcl) .... Take 1 by mouth qd 3)  Humalog Mix 75/25 Kwikpen 75-25 % Susp (Insulin lispro prot & lispro) .[name].. 15 units in an and 35  untis in pm 4)  Onetouch Ultra Test Strp (  Glucose blood) .... Three times a day, and lancets 150.01 5)  Onetouch Delica Lancets Misc (Lancets) .[name].. 1 three times a day 6)  Alpha-lipoic Acid 50 Mg Caps (Alpha-lipoic acid) .[name].. 1 once daily 7)  Cialis 20 Mg Tabs (Tadalafil) .... As needed 8)  Yl Vitamin C-rose Hips 500 Mg Tabs (Ascorbic acid) .[name].. 1 once daily 9)  Folic Acid 800 Mcg Tabs (Folic acid) .[name].. 1 once daily 10)  Multivitamins Caps (Multiple vitamin) .[name].. 1 once daily 11)  Lisinopril 10 Mg Tabs (Lisinopril) .[name].. 1 once daily 12)  Levemir Flexpen 100 Unit/ml Soln (Insulin detemir) .... Take as directed 13)  Etodolac 400 Mg Tabs (Etodolac) .[name].. 1 two times a day 14)  Nutraburst Food Supplement  .... Two times a day 15)  Glucosamine 500 Mg Caps (Glucosamine sulfate) .[name].. 1 once daily  Other Orders: TLB-Testosterone, Total (84403-TESTO)  Hypertension Assessment/Plan:      The patient's hypertensive risk group is category C: Target organ damage and/or diabetes.  His calculated 10 year risk of coronary heart disease is 14 %.  Today's blood pressure is 122/70.  His blood pressure goal is < 130/80.  Patient Instructions: 1)  Please schedule a follow-up appointment in 3 months.  CPX Prescriptions: LISINOPRIL 10 MG TABS (LISINOPRIL) 1 once daily  #30 x 6   Entered by:   Willy Eddy, LPN   Authorized by:   Stacie Glaze MD   Signed by:   Willy Eddy, LPN on 91/47/8295   Method used:   Electronically to        [facility] [phone]* (retail)       [address]       [place], [place]  [address]       Ph: [phone] or [phone]       Fax: [phone]   RxID:   [id number]    Orders Added: 1)  Est. Patient Level IV [29518] 2)  Venipuncture [84166] 3)  TLB-CBC Platelet - w/Differential  [85025-CBCD] 4)  TLB-A1C / Hgb A1C (Glycohemoglobin) [83036-A1C] 5)  TLB-Microalbumin/Creat Ratio, Urine [82043-MALB] 6)  TLB-Testosterone, Total [84403-TESTO]  Appended Document: Orders Update    Clinical Lists Changes  Orders: Added new Service order of Specimen Handling (06301) - Signed

## 2010-09-05 NOTE — Progress Notes (Signed)
Summary: Pt out of Levemir. Req refill asap or samples  Phone Note Call from Patient Call back at Home Phone [phone] Call back at [phone] cell   Caller: Patient Summary of Call: Pt called and he said that he is out of insulin and needs the Levemir called in today or if he can come by and get samples.  Initial call taken by: [name] Antigua,  July 22, 2010 1:51 PM    New/Updated Medications: LEVEMIR FLEXPEN 100 UNIT/ML SOLN (INSULIN DETEMIR) take as directed Prescriptions: LEVEMIR FLEXPEN 100 UNIT/ML SOLN (INSULIN DETEMIR) take as directed  #2 x 6   Entered by:   Willy Eddy, LPN   Authorized by:   Stacie Glaze MD   Signed by:   Willy Eddy, LPN on 46/96/2952   Method used:   Electronically to        [facility]  [address] (retail)       [address]       [place], [place]  [address]       Ph: [phone]       Fax: [phone]   RxID:   [id number]

## 2010-09-24 ENCOUNTER — Other Ambulatory Visit: Payer: Self-pay | Admitting: *Deleted

## 2010-09-24 DIAGNOSIS — N529 Male erectile dysfunction, unspecified: Secondary | ICD-10-CM

## 2010-09-24 MED ORDER — TADALAFIL 20 MG PO TABS: 20.0000 mg | ORAL_TABLET | Freq: Every day | ORAL | Status: DC | PRN

## 2010-09-24 MED ORDER — INSULIN ASPART PROT & ASPART (70-30 MIX) 100 UNIT/ML ~~LOC~~ SUSP: 6.0000 [IU] | Freq: Two times a day (BID) | SUBCUTANEOUS | Status: DC

## 2010-10-01 ENCOUNTER — Other Ambulatory Visit: Payer: Self-pay | Admitting: Internal Medicine

## 2010-10-01 DIAGNOSIS — I10 Essential (primary) hypertension: Secondary | ICD-10-CM

## 2010-11-04 ENCOUNTER — Encounter: Payer: Self-pay | Admitting: Internal Medicine

## 2010-11-15 ENCOUNTER — Other Ambulatory Visit: Payer: Self-pay | Admitting: Internal Medicine

## 2010-11-29 ENCOUNTER — Telehealth: Payer: Self-pay | Admitting: *Deleted

## 2010-11-29 MED ORDER — AZITHROMYCIN 250 MG PO TABS: 250.0000 mg | ORAL_TABLET | Freq: Every day | ORAL | Status: AC

## 2010-11-29 NOTE — Telephone Encounter (Signed)
Sore throat x 3 days with cough, sinus drainage, no fever.  Wants Zpack. [facility])

## 2010-11-29 NOTE — Telephone Encounter (Signed)
Per dr jenkins-may have z pack 

## 2010-12-20 NOTE — Op Note (Signed)
NAME:  Nathaniel Thomas, Nathaniel Thomas NO.:  [id number]   MEDICAL RECORD NO.:  [id number]                   PATIENT TYPE:  EMS   LOCATION:  ED                                   FACILITY:  [facility]   PHYSICIAN:  Jamison Neighbor, M.D.               DATE OF BIRTH:  Oct 27, 1950   DATE OF PROCEDURE:  11/10/2003  DATE OF DISCHARGE:                                 OPERATIVE REPORT   SERVICE:  Urology.   PREOPERATIVE DIAGNOSES:  Distal ureteral calculi, left.   POSTOPERATIVE DIAGNOSES:  Distal ureteral calculi, left.   PROCEDURE:  Cystoscopy, retrograde, ureteroscopy, basket extraction x3,  double J catheter insertion.   SURGEON:  Jamison Neighbor, M.D.   ANESTHESIA:  General.   COMPLICATIONS:  None.   DRAINS:  7 French x 28 cm double J catheter.   BRIEF HISTORY:  This 60 year old male has significant problems with stone  formation. The patient was seen in the office last week and was thought to  have stones that might possibly pass.  He was originally scheduled to  undergo surgery on Tuesday if the stones did not pass but he felt so much  better that he elected not to undergo the procedure.  Unfortunately, the  pain got worse and he ended up having emergency surgery on Wednesday by  Maretta Bees. Vonita Moss, M.D.  Dr. Vonita Moss removed a 5 mm stone from the distal  ureter and felt that it was atraumatic and thus he elected not to leave the  stent.   The patient ended up going to the emergency room after he had been seen at  [facility] and it was clear that there did appear to  be two additional stones in the distal left ureter.  The patient's pain is  severe and he is now to undergo ureteroscopy for additional stone removal.  The patient and his wife have been told at this time the stent will  definitely be left and will be left in place for a week or two to allow any  other stone material to pass.  The patient understands the risks and  benefits of  the procedure and gave full and informed consent.   DESCRIPTION OF PROCEDURE:  After successful induction of general anesthesia,  the patient was placed in the dorsal lithotomy position, prepped with  Betadine and draped in the usual sterile fashion.  Cystoscopy was performed,  urethra was visualized in its entirety and was found to be normal.  Beyond  the verumontanum, there was moderate prostatic hypertrophy but no real  obstruction. The bladder neck was wide open, the bladder itself was  unremarkable.  The right ureteral orifice was normal in configuration in  location.  On the left hand side, the ureter showed signs of recent surgical  intervention and was slightly dilated.  An open end catheter was passed and  a retrograde study  was performed which did show some dilation of ureter but  clear cut filling defects could not be seen. The guidewire was advanced up  to the kidney where it coiled normally within the pelvis. The ureteroscope  was then inserted along side the guidewire and several small calculi as well  as clots could be identified. The ureteroscope was advanced into the more  proximal ureter where dilation was seen but no additional stone material  could be identified. As the ureteroscope was pulled back, the various stones  were grasped and extracted.  It turned out that three stones were removed  along with several clots.  The ureter appeared to be nicely cleared.  Because there have been several stones removed, it was felt that it would be  best to place a stent and for that reason the cystoscope was loaded back  over the guidewire and the 7 French x 28 cm double J was passed over the  guidewire up to the kidney. This coiled normally in the pelvis as well as  within the bladder.  The patient tolerated the procedure well and was taken  to the recovery room in good condition.  He will be sent home with pain  medication as well as one antibiotic pill daily until the stent is  removed  and Pyridium plus in case he has any symptoms.  Return to the office in 1-2  weeks for stent removal.                                               Jamison Neighbor, M.D.    RJE/MEDQ  D:  11/10/2003  T:  11/11/2003  Job:  [id number]

## 2010-12-20 NOTE — Op Note (Signed)
NAME:  Nathaniel Thomas, Nathaniel Thomas                          ACCOUNT NO.:  [id number]   MEDICAL RECORD NO.:  [id number]                   PATIENT TYPE:  OBV   LOCATION:  0382                                 FACILITY:  [facility]   PHYSICIAN:  Maretta Bees. Vonita Moss, M.D.             DATE OF BIRTH:  18-Apr-1951   DATE OF PROCEDURE:  11/08/2003  DATE OF DISCHARGE:  11/09/2003                                 OPERATIVE REPORT   PREOPERATIVE DIAGNOSIS:  Left ureteral calculus.   POSTOPERATIVE DIAGNOSIS:  Left ureteral calculus.   PROCEDURES:  1. Cystoscopy.  2. Left ureteroscopy.  3. Holmium laser and stone basketing.  4. Left retrograde pyelogram with interpretation.   SURGEON:  Maretta Bees. Vonita Moss, M.D.   ANESTHESIA:  General.   INDICATIONS:  This 60 year old white male with multiple renal calculi has  had several days of left flank pain that had been severe due to a 5 mm stone  in the left midureter.  I could not see the stone on KUB, and he was  scheduled for surgery two days from now but because of the severe persistent  pain, he wanted to undergo intervention tonight.  He was advised about  ureteroscopy and possible double J catheter and the risks of bleeding or  perforation of the ureter.   PROCEDURE:  The patient was brought to the operating room and placed in  lithotomy position.  External genitalia were prepped and draped in the usual  sterile fashion.  He was cystoscoped and the bladder was unremarkable.  He  had just partial prostatic obstruction.  I placed a metal guidewire up the  left ureter, and it would not admit obstruction at the top of the sacroiliac  junction.  I did not want to push with the metal guidewire, so over the  metal guidewire I inserted a 5 [place] open-ended ureteral catheter and  injected contrast and saw that there was a narrowing and filling defect  where I felt the stone was as described above.  I then used a Glidewire,  which, fortunately, went easily past the stone  and into the collecting  system of the kidney.  I then inserted an open-ended ureteral catheter over  the Glidewire above the stone and reinserted a metal guidewire.  I then used  the 6 [place] short rigid ureteroscope and easily negotiated my way up the  distal ureter until I saw this irregular, firm-looking stone.  I grasped the  stone in the nitinol stone basket but the stone did not easily go back down  the ureter, so I disengaged the stone from the basket and inserted a holmium  laser fiber and treated the stone, which was really very firm, and a couple  of small chips were dislodged from the main stone.  The stone was being  moved further up the ureter, and I felt that it would soon be in the kidney  if I  did not use the stone basket and bring it back down the ureter where I  could continue treatment with the laser.  I then reinserted the Nitinol  stone basket and engaged the stone and as I pulled down, it easily came down  the ureter and was totally extracted in one piece without any trauma to the  ureter.  I then reinserted the ureteroscope and saw no evidence of mucosal  disruption or damage to the ureter, and there were just very few fine, sandy  pieces of stone which I felt would easily pass on their own.  I then  injected contrast through  the ureteroscope, and the ureter was intact with no evidence of  extravasation and prompt drainage of the contrast.  Therefore, I decided not  to leave up the double J catheter, and I removed the guidewire, emptied the  bladder, and the patient was sent to the recovery room in good condition.  The stone was given to the patient's wife.                                               Maretta Bees. Vonita Moss, M.D.    LJP/MEDQ  D:  11/08/2003  T:  11/09/2003  Job:  [id number]

## 2010-12-25 ENCOUNTER — Other Ambulatory Visit (INDEPENDENT_AMBULATORY_CARE_PROVIDER_SITE_OTHER): Payer: 59 | Admitting: Internal Medicine

## 2010-12-25 DIAGNOSIS — Z0000 Encounter for general adult medical examination without abnormal findings: Secondary | ICD-10-CM

## 2010-12-25 LAB — POCT URINALYSIS DIPSTICK
Blood, UA: NEGATIVE
Glucose, UA: NEGATIVE
Ketones, UA: NEGATIVE
Leukocytes, UA: NEGATIVE
Nitrite, UA: NEGATIVE
Protein, UA: NEGATIVE
Spec Grav, UA: 1.025
Urobilinogen, UA: 0.2
pH, UA: 6.5

## 2010-12-25 LAB — CBC WITH DIFFERENTIAL/PLATELET
Basophils Absolute: 0 10*3/uL (ref 0.0–0.1)
Basophils Relative: 0.8 % (ref 0.0–3.0)
Eosinophils Absolute: 0.2 10*3/uL (ref 0.0–0.7)
Eosinophils Relative: 3.7 % (ref 0.0–5.0)
HCT: 40.7 % (ref 39.0–52.0)
Hemoglobin: 13.7 g/dL (ref 13.0–17.0)
Lymphocytes Relative: 22.6 % (ref 12.0–46.0)
Lymphs Abs: 1.4 10*3/uL (ref 0.7–4.0)
MCHC: 33.5 g/dL (ref 30.0–36.0)
MCV: 92.3 fl (ref 78.0–100.0)
Monocytes Absolute: 0.6 10*3/uL (ref 0.1–1.0)
Monocytes Relative: 9.4 % (ref 3.0–12.0)
Neutro Abs: 3.8 10*3/uL (ref 1.4–7.7)
Neutrophils Relative %: 63.5 % (ref 43.0–77.0)
Platelets: 200 10*3/uL (ref 150.0–400.0)
RBC: 4.41 Mil/uL (ref 4.22–5.81)
RDW: 13.8 % (ref 11.5–14.6)
WBC: 6 10*3/uL (ref 4.5–10.5)

## 2010-12-25 LAB — LIPID PANEL
Cholesterol: 143 mg/dL (ref 0–200)
HDL: 34.6 mg/dL — ABNORMAL LOW (ref >39.00)
LDL Cholesterol: 91 mg/dL (ref 0–99)
Total CHOL/HDL Ratio: 4
Triglycerides: 88 mg/dL (ref 0.0–149.0)
VLDL: 17.6 mg/dL (ref 0.0–40.0)

## 2010-12-25 LAB — BASIC METABOLIC PANEL
BUN: 17 mg/dL (ref 6–23)
CO2: 29 mEq/L (ref 19–32)
Calcium: 8.8 mg/dL (ref 8.4–10.5)
Chloride: 106 mEq/L (ref 96–112)
Creatinine, Ser: 0.8 mg/dL (ref 0.4–1.5)
GFR: 98.69 mL/min (ref >60.00)
Glucose, Bld: 102 mg/dL — ABNORMAL HIGH (ref 70–99)
Potassium: 5 mEq/L (ref 3.5–5.1)
Sodium: 141 mEq/L (ref 135–145)

## 2010-12-25 LAB — HEMOGLOBIN A1C: Hgb A1c MFr Bld: 8.5 % — ABNORMAL HIGH (ref 4.6–6.5)

## 2010-12-25 LAB — HEPATIC FUNCTION PANEL
ALT: 22 U/L (ref 0–53)
AST: 21 U/L (ref 0–37)
Albumin: 3.9 g/dL (ref 3.5–5.2)
Alkaline Phosphatase: 47 U/L (ref 39–117)
Bilirubin, Direct: 0.1 mg/dL (ref 0.0–0.3)
Total Bilirubin: 0.5 mg/dL (ref 0.3–1.2)
Total Protein: 6.6 g/dL (ref 6.0–8.3)

## 2010-12-25 LAB — MICROALBUMIN / CREATININE URINE RATIO
Creatinine,U: 177.7 mg/dL
Microalb Creat Ratio: 1 mg/g (ref 0.0–30.0)
Microalb, Ur: 1.7 mg/dL (ref 0.0–1.9)

## 2010-12-25 LAB — PSA: PSA: 2.29 ng/mL (ref 0.10–4.00)

## 2010-12-25 LAB — TSH: TSH: 1 u[IU]/mL (ref 0.35–5.50)

## 2010-12-31 ENCOUNTER — Other Ambulatory Visit: Payer: Self-pay | Admitting: Internal Medicine

## 2011-01-01 ENCOUNTER — Encounter: Payer: Self-pay | Admitting: Internal Medicine

## 2011-01-01 ENCOUNTER — Ambulatory Visit (INDEPENDENT_AMBULATORY_CARE_PROVIDER_SITE_OTHER): Payer: 59 | Admitting: Internal Medicine

## 2011-01-01 ENCOUNTER — Other Ambulatory Visit: Payer: Self-pay | Admitting: *Deleted

## 2011-01-01 VITALS — BP 140/80 | HR 86 | Temp 98.2°F | Resp 16 | Ht 76.0 in | Wt 232.0 lb

## 2011-01-01 DIAGNOSIS — Z0000 Encounter for general adult medical examination without abnormal findings: Secondary | ICD-10-CM

## 2011-01-01 DIAGNOSIS — I10 Essential (primary) hypertension: Secondary | ICD-10-CM

## 2011-01-01 DIAGNOSIS — E109 Type 1 diabetes mellitus without complications: Secondary | ICD-10-CM

## 2011-01-01 DIAGNOSIS — E785 Hyperlipidemia, unspecified: Secondary | ICD-10-CM

## 2011-01-01 MED ORDER — INSULIN DETEMIR 100 UNIT/ML ~~LOC~~ SOLN: 55.0000 [IU] | Freq: Every day | SUBCUTANEOUS | Status: DC

## 2011-01-01 NOTE — Progress Notes (Signed)
Addended by: Willy Eddy on: 01/01/2011 04:24 PM   Modules accepted: Orders

## 2011-01-01 NOTE — Progress Notes (Signed)
Subjective:    Patient ID: Nathaniel Thomas, male    DOB: 1950-08-18, 60 y.o.   MRN: [id number]  HPI patient presents for complete physical examination.  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  HENT: Negative for hearing loss, congestion, neck pain and postnasal drip.   Eyes: Negative for discharge, redness and visual disturbance.  Respiratory: Negative for cough, shortness of breath and wheezing.   Cardiovascular: Negative for leg swelling.  Gastrointestinal: Negative for abdominal pain, constipation and abdominal distention.  Genitourinary: Negative for urgency and frequency.  Musculoskeletal: Negative for joint swelling and arthralgias.  Skin: Negative for color change and rash.  Neurological: Negative for weakness and light-headedness.  Hematological: Negative for adenopathy.  Psychiatric/Behavioral: Negative for behavioral problems.    Past Medical History  Diagnosis Date  . Diabetes mellitus   . Hypertension   . Hyperlipidemia   . Chronic kidney disease   . Renal calculus or stone    Past Surgical History  Procedure Date  . Joint replacement   . Tonsillectomy   . Lithotripsy   . Laminectomy     reports that he has never smoked. He does not have any smokeless tobacco history on file. He reports that he does not drink alcohol or use illicit drugs. family history is not on file. Allergies  Allergen Reactions  . Meperidine Hcl           Objective:   Physical Exam  Constitutional: He is oriented to person, place, and time. He appears well-developed and  well-nourished.  HENT:  Head: Normocephalic and atraumatic.  Eyes: Conjunctivae are normal. Pupils are equal, round, and reactive to light.  Neck: Normal range of motion. Neck supple.  Cardiovascular: Normal rate and regular rhythm.   Pulmonary/Chest: Effort normal and breath sounds normal.  Abdominal: Soft. Bowel sounds are normal.  Genitourinary: Rectum normal and prostate normal. Guaiac negative stool. No penile tenderness.  Musculoskeletal: He exhibits no edema and no tenderness.  Neurological: He is alert and oriented to person, place, and time.  Skin: Skin is warm and dry.  Psychiatric: He has a normal mood and affect. His behavior is normal.          Assessment & Plan:   Patient presents for yearly preventative medicine examination.   all immunizations and health maintenance protocols were reviewed with the patient and they are up to date with these protocols.   screening laboratory values were reviewed with the patient including screening of hyperlipidemia PSA renal function and hepatic function.   There medications past medical history social history problem list and allergies were reviewed in detail.   Goals were established with regard to weight loss exercise diet in compliance with medications Patient's A1c was still elevated in the 8 range but I believe this is due to 2 weeks of a bariatric diet in which she stopped all of his insulin.  He has lost 15 pounds his fasting blood sugars are in the 100 range I believe an A1c checked in 2 months will be much improved.  Pressure is well controlled cholesterol is  at goal his reflux is better controlled with his weight loss

## 2011-01-07 ENCOUNTER — Other Ambulatory Visit: Payer: Self-pay | Admitting: *Deleted

## 2011-01-07 MED ORDER — ETODOLAC 400 MG PO TABS: 400.0000 mg | ORAL_TABLET | Freq: Two times a day (BID) | ORAL | Status: DC

## 2011-02-27 ENCOUNTER — Other Ambulatory Visit: Payer: 59

## 2011-02-28 ENCOUNTER — Other Ambulatory Visit (INDEPENDENT_AMBULATORY_CARE_PROVIDER_SITE_OTHER): Payer: 59

## 2011-02-28 DIAGNOSIS — E119 Type 2 diabetes mellitus without complications: Secondary | ICD-10-CM

## 2011-02-28 LAB — HEMOGLOBIN A1C: Hgb A1c MFr Bld: 8.4 % — ABNORMAL HIGH (ref 4.6–6.5)

## 2011-03-03 ENCOUNTER — Emergency Department (HOSPITAL_COMMUNITY)
Admission: EM | Admit: 2011-03-03 | Discharge: 2011-03-04 | Disposition: A | Discharge status: Home / Self Care | Payer: 59 | Attending: Emergency Medicine | Admitting: Emergency Medicine

## 2011-03-03 ENCOUNTER — Emergency Department (HOSPITAL_COMMUNITY): Payer: 59

## 2011-03-03 DIAGNOSIS — Z794 Long term (current) use of insulin: Secondary | ICD-10-CM | POA: Insufficient documentation

## 2011-03-03 DIAGNOSIS — R072 Precordial pain: Secondary | ICD-10-CM | POA: Insufficient documentation

## 2011-03-03 DIAGNOSIS — E119 Type 2 diabetes mellitus without complications: Secondary | ICD-10-CM | POA: Insufficient documentation

## 2011-03-03 DIAGNOSIS — Z79899 Other long term (current) drug therapy: Secondary | ICD-10-CM | POA: Insufficient documentation

## 2011-03-03 LAB — CBC
HCT: 38.5 % — ABNORMAL LOW (ref 39.0–52.0)
Hemoglobin: 13.3 g/dL (ref 13.0–17.0)
MCH: 30.8 pg (ref 26.0–34.0)
MCHC: 34.5 g/dL (ref 30.0–36.0)
MCV: 89.1 fL (ref 78.0–100.0)
Platelets: 215 10*3/uL (ref 150–400)
RBC: 4.32 MIL/uL (ref 4.22–5.81)
RDW: 13.6 % (ref 11.5–15.5)
WBC: 9.1 10*3/uL (ref 4.0–10.5)

## 2011-03-03 LAB — COMPREHENSIVE METABOLIC PANEL
ALT: 27 U/L (ref 0–53)
AST: 25 U/L (ref 0–37)
Albumin: 3.9 g/dL (ref 3.5–5.2)
Alkaline Phosphatase: 59 U/L (ref 39–117)
BUN: 18 mg/dL (ref 6–23)
CO2: 29 mEq/L (ref 19–32)
Calcium: 9.4 mg/dL (ref 8.4–10.5)
Chloride: 101 mEq/L (ref 96–112)
Creatinine, Ser: 0.9 mg/dL (ref 0.50–1.35)
GFR calc Af Amer: >60 mL/min (ref >60)
GFR calc non Af Amer: >60 mL/min (ref >60)
Glucose, Bld: 272 mg/dL — ABNORMAL HIGH (ref 70–99)
Potassium: 4.8 mEq/L (ref 3.5–5.1)
Sodium: 137 mEq/L (ref 135–145)
Total Bilirubin: 0.3 mg/dL (ref 0.3–1.2)
Total Protein: 6.9 g/dL (ref 6.0–8.3)

## 2011-03-03 LAB — TROPONIN I: Troponin I: <0.3 ng/mL (ref <0.30)

## 2011-03-03 IMAGING — CR DG CHEST 1V PORT
1 series · 1 of 1 positions shown · non-contrast
Comparison: None.

CLINICAL DATA: Chest pain.

CHEST - 1 VIEW

[view not recorded]
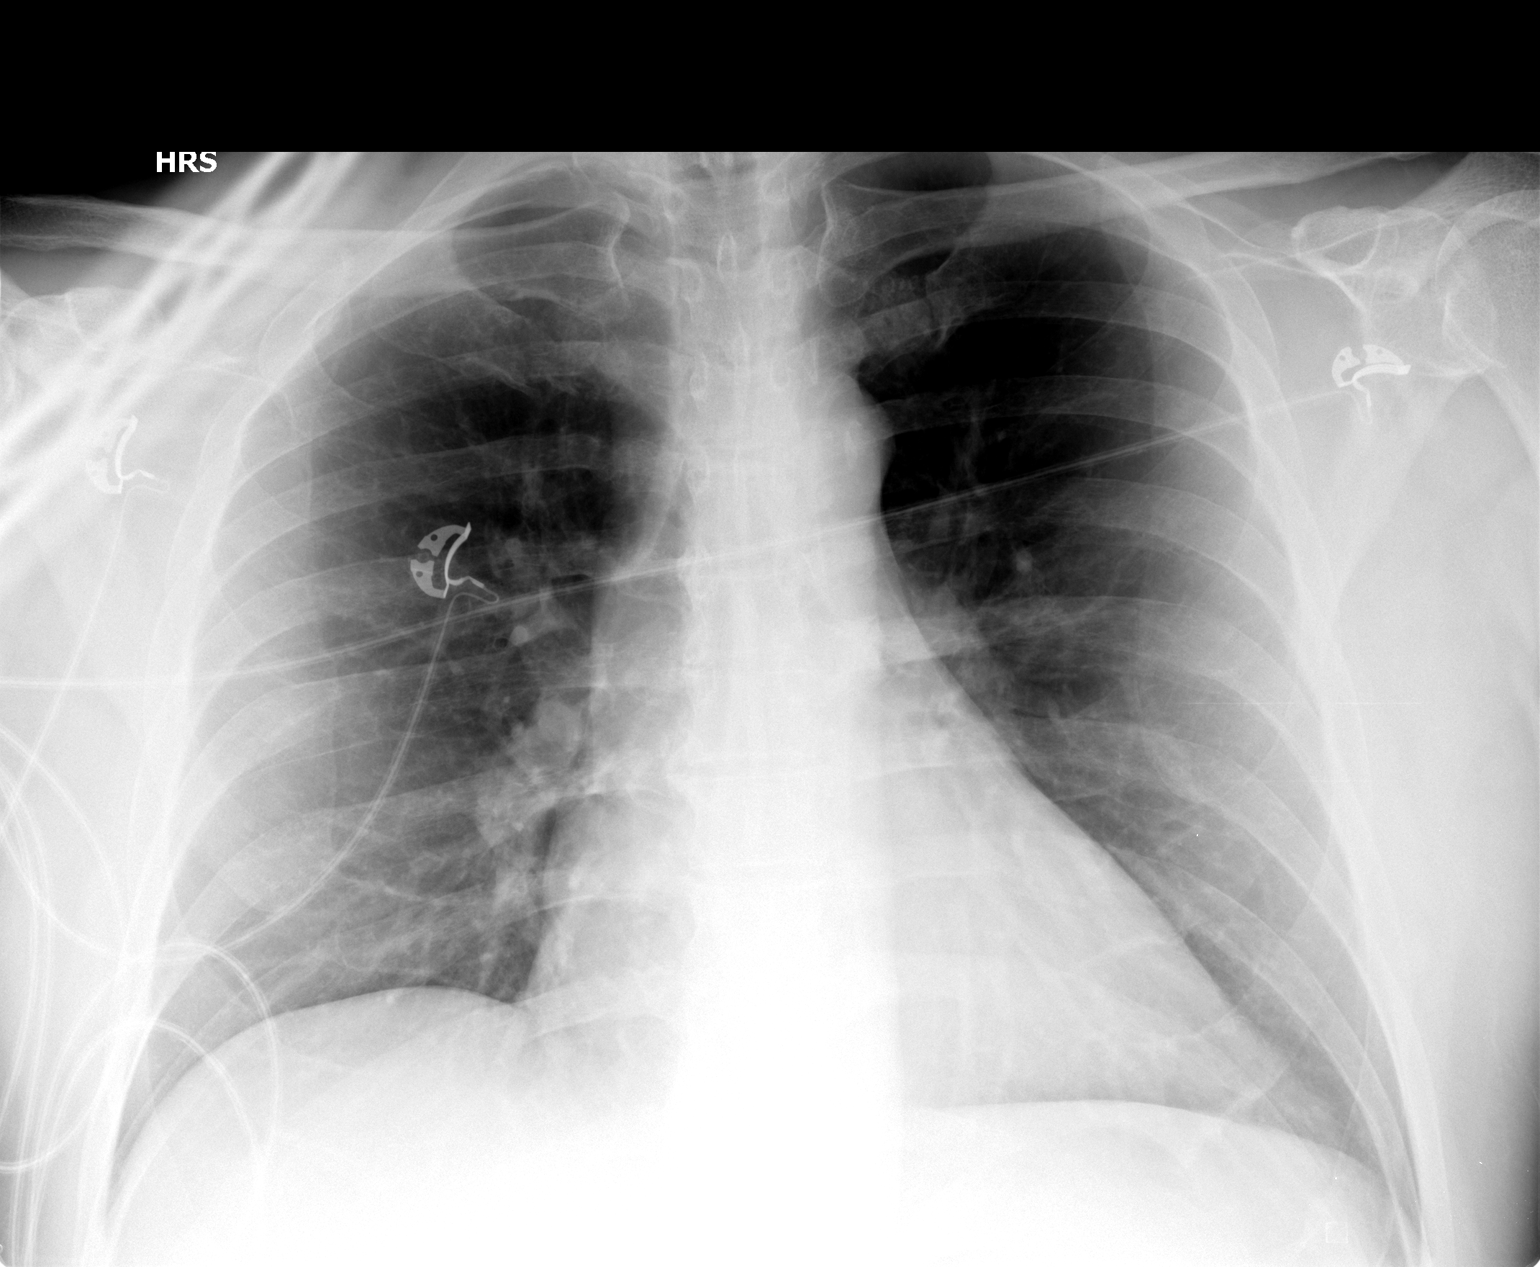

[1 of 1 positions shown; findings below may reference images not displayed]

FINDINGS: The heart size and mediastinal contours are within normal
limits.  Both lungs are clear.
IMPRESSION: No active disease.

## 2011-03-04 ENCOUNTER — Emergency Department (HOSPITAL_COMMUNITY): Payer: 59

## 2011-03-04 LAB — D-DIMER, QUANTITATIVE (NOT AT ARMC): D-Dimer, Quant: 0.41 ug/mL-FEU (ref 0.00–0.48)

## 2011-03-04 LAB — TROPONIN I
Troponin I: <0.3 ng/mL (ref <0.30)
Troponin I: <0.3 ng/mL (ref <0.30)

## 2011-03-04 LAB — GLUCOSE, CAPILLARY
Glucose-Capillary: 169 mg/dL — ABNORMAL HIGH (ref 70–99)
Glucose-Capillary: 194 mg/dL — ABNORMAL HIGH (ref 70–99)

## 2011-03-04 IMAGING — CT CT HEART MORP W/ CTA COR W/ SCORE W/ CA W/CM &/OR W/O CM
3 of 9 series · 6 of 20 positions shown, 7 images · IV contrast (CONTRAST)
Comparison: Plain film of 1 day prior.  No prior chest CT.

INDICATION: Chest tightness.  Heart palpitations last night.

CT ANGIOGRAPHY OF THE HEART, CORONARY ARTERY, STRUCTURE, AND
MORPHOLOGY
CONTRAST:  150 ml Omnipaque 350
TECHNIQUE: CT angiography of the coronary vessels was performed on
a 256 channel system using prospective ECG gating.  A scout and
noncontrast exam (for calcium scoring) were performed.  Circulation
time was measured using a test bolus.  Coronary CTA was performed
with sub mm slice collimation during portions of the cardiac cycle
after prior injection of iodinated contrast.  Imaging post
processing was performed on an independent workstation creating
multiplanar and 3-D images, and quantitative analysis of the heart
and coronary arteries.  Note that this exam targets the heart and
the chest was not imaged in its entirety.
PREMEDICATION:
Lopressor 50 mg, P.O.
Lopressor 12.5 mg, IV
Nitroglycerin 0.4 mcg, sublingual.

[Series 26: w/ edge cor., 78.0% · axial · 0.49mm/px · z∈[-261,-215]mm · 2 of 311 slices shown, 3 images]
[im 104/311  vessel]
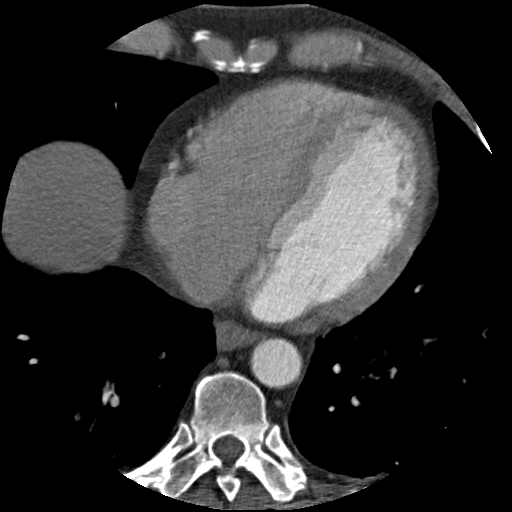
[im 104/311  lung]
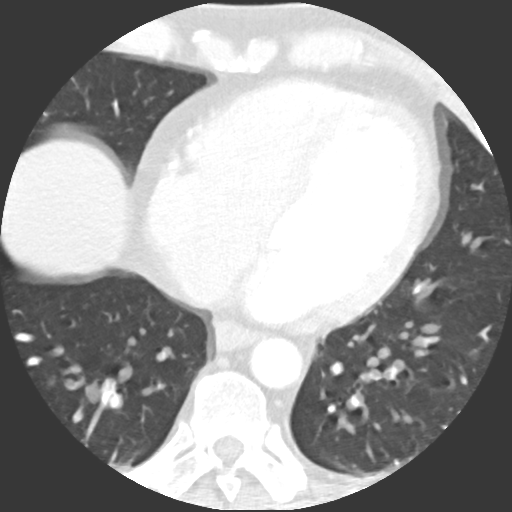
[im 207/311  vessel]
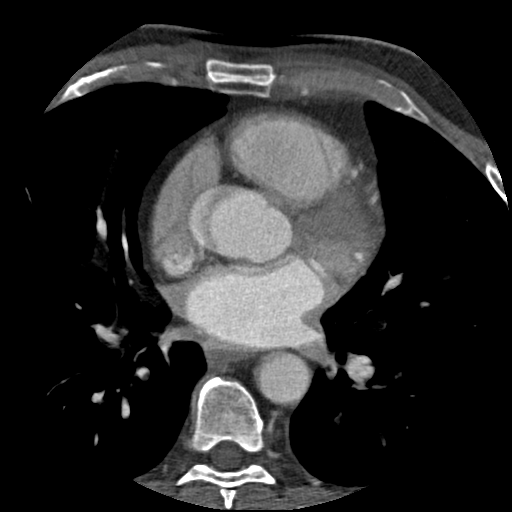

[Series 28: w/ edge corr., 73.0% · axial · 0.49mm/px · z∈[-261,-215]mm · 2 of 311 slices shown]
[im 104/311  lung]
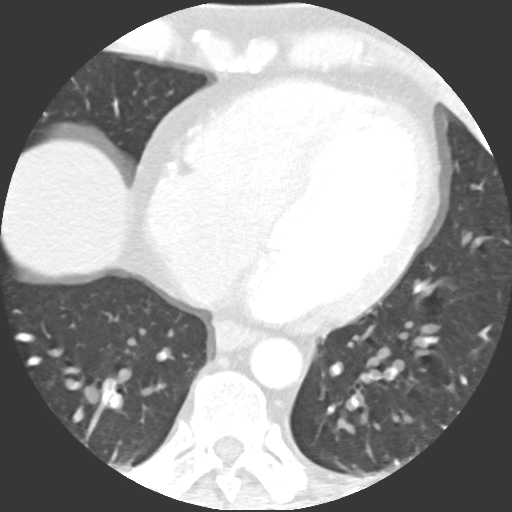
[im 207/311  lung]
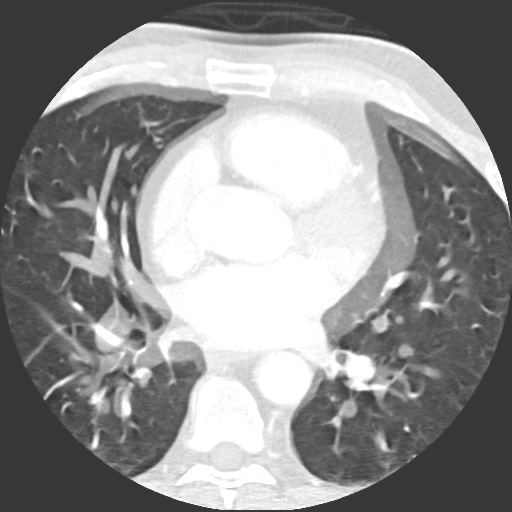

[Series 30: w/ edge corr., 83.0% · axial · 0.49mm/px · z∈[-261,-215]mm · 2 of 311 slices shown]
[im 104/311  lung]
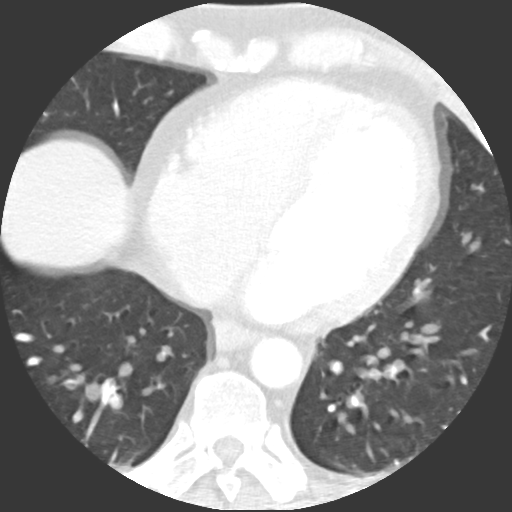
[im 207/311  lung]
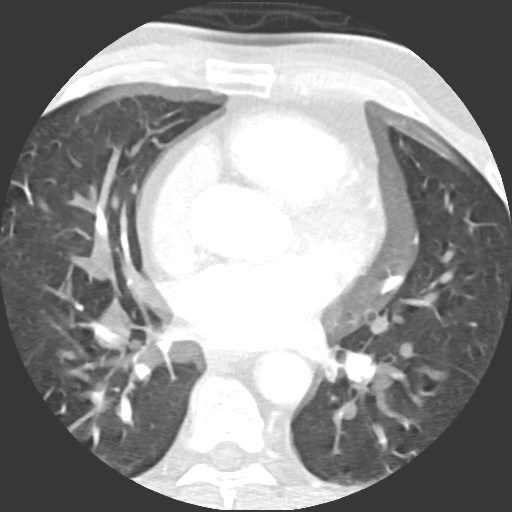

[6 of 20 positions shown; findings below may reference images not displayed]

FINDINGS: Technical quality:  Moderate.  The initial contrast enhanced study
omitted the proximal coronary artery is secondary to altered
respiratory motion.  The subsequent portion includes this area.
There is mild respiratory motion on both studies.

Heart rate:  58

CORONARY ARTERIES:
Left main coronary artery:  A moderate length vessel which arises
from the left coronary cusp.  Without significant disease.
Left anterior descending:  Demonstrates a mixed, partially solid
partially calcified plaque within its proximal most aspect.
Especially on  73% and 83% images, less than 50% stenosis
identified.

Subsequent to this stenosis, the LAD gives rise to a moderate-sized
branching first diagonal which is free of disease.  The LAD
continues distally as a moderate length vessel without significant
disease.
Left circumflex:  Immediately gives rise to a moderate sized
marginal branch which branches and is patent.
The circumflex then terminates in a moderate sized patent second
marginal branch.  A nondominant vessel.
Right coronary artery:  A dominant vessel which arises from right
coronary cusp.  Minimal misregistration at the angle of the heart.
No significant stenosis.
Posterior descending artery:  Supplied by the distal right.
Patent.
Dominance:  Right-sided

CORONARY CALCIUM:
Total Agatston Score:
[HOSPITAL] percentile:  42nd

CARDIAC MEASUREMENTS:
Interventricular septum (6 - 12 mm):  12
LV posterior wall (6 - 12 mm):  10
LV diameter in diastole (35 - 52 mm):  47

AORTA AND PULMONARY MEASUREMENTS:
Aortic root (21 - 40 mm):
            30  at the annulus
            39  at the sinuses of Valsalva
            31  at the sinotubular junction
Ascending aorta ( <  40 mm):  37
Descending aorta ( <  40 mm):  27
Main pulmonary artery:  ( <  30 mm):  29

No left atrial appendage thrombus.  No septal defect.  Tricuspid
aortic valve.

EXTRACARDIAC FINDINGS:
Lung windows demonstrate no nodules or airspace opacities.

Soft tissue windows demonstrate no pericardial or pleural effusion.
No evidence of aortic dissection.  No central pulmonary embolism.
Apparent hypoenhancement in the lower lobe pulmonary arterial tree
on series 45 is secondary to respiratory motion.

Limited abdominal imaging demonstrates no significant findings.
IMPRESSION: 1.  Mild to moderate coronary artery disease.  The patient's total
coronary artery calcium score is 28.9, which is 42nd percentile for
patient's matched age and gender.
2.  The calcium is all part of a mixed plaque in the proximal LAD
which causes less than 50% stenosis.
3.  Right-sided coronary artery dominance.
4.  Moderate quality exam requiring repeat imaging of the proximal
coronary arteries.

Report was called to [REDACTED] mid level, CRUDEN, pa at [DATE] p.m. on
[DATE].

## 2011-03-04 MED ORDER — IOHEXOL 350 MG/ML SOLN
80.0000 mL | Freq: Once | INTRAVENOUS | Status: AC | PRN
  Administered 2011-03-04: 150 mL via INTRAVENOUS

## 2011-03-05 ENCOUNTER — Encounter: Payer: Self-pay | Admitting: Internal Medicine

## 2011-03-05 ENCOUNTER — Ambulatory Visit (INDEPENDENT_AMBULATORY_CARE_PROVIDER_SITE_OTHER): Payer: 59 | Admitting: Internal Medicine

## 2011-03-05 DIAGNOSIS — E785 Hyperlipidemia, unspecified: Secondary | ICD-10-CM

## 2011-03-05 DIAGNOSIS — R079 Chest pain, unspecified: Secondary | ICD-10-CM

## 2011-03-05 DIAGNOSIS — I10 Essential (primary) hypertension: Secondary | ICD-10-CM

## 2011-03-05 DIAGNOSIS — E109 Type 1 diabetes mellitus without complications: Secondary | ICD-10-CM

## 2011-03-05 MED ORDER — CHLORZOXAZONE 500 MG PO TABS: 500.0000 mg | ORAL_TABLET | Freq: Four times a day (QID) | ORAL | Status: DC | PRN

## 2011-03-05 MED ORDER — PITAVASTATIN CALCIUM 2 MG PO TABS: 1.0000 | ORAL_TABLET | ORAL | Status: DC

## 2011-03-05 NOTE — Progress Notes (Signed)
Subjective:    Patient ID: Nathaniel Thomas, male    DOB: 01-22-1951, 60 y.o.   MRN: [id number]  HPI  After mowing yard , working out side. Experienced  Rest chest pain. Was seen in ER and admitted for r/o Overnight admit and cardiac CT showed 30% lesion. Enzymes ruled out acute. Hx of chest was pain Has lithotripsy scheduled  8/8 for two large left renal stones Has beach trip planned 8/12  He had a Myoview done in 2011 which was reportedly normal he did have a hypertensive response to the stress part of the test and we attempted to titrate his blood pressure medicines which resulted in experiencing hypotensive episodes   Review of Systems  Constitutional: Negative for fever and fatigue.  HENT: Negative for hearing loss, congestion, neck pain and postnasal drip.   Eyes: Negative for discharge, redness and visual disturbance.  Respiratory: Negative for cough, shortness of breath and wheezing.   Cardiovascular: Negative for leg swelling.  Gastrointestinal: Negative for abdominal pain, constipation and abdominal distention.  Genitourinary: Negative for urgency and frequency.  Musculoskeletal: Negative for joint swelling and arthralgias.  Skin: Negative for color change and rash.  Neurological: Negative for weakness and light-headedness.  Hematological: Negative for adenopathy.  Psychiatric/Behavioral: Negative for behavioral problems.   Past Medical History  Diagnosis Date  . Diabetes mellitus   . Hypertension   . Hyperlipidemia   . Chronic kidney disease   . Renal calculus or stone    Past Surgical History  Procedure Date  . Joint replacement   . Tonsillectomy   . Lithotripsy   . Laminectomy     reports that he has never smoked. He does not have any smokeless tobacco history on file. He reports that he does not drink alcohol or use illicit drugs. family history is not on file. Allergies  Allergen Reactions  . Meperidine Hcl        Objective:   Physical Exam  Nursing  note and vitals reviewed. Constitutional: He appears well-developed and well-nourished.  HENT:  Head: Normocephalic and atraumatic.  Eyes: Conjunctivae are normal. Pupils are equal, round, and reactive to light.  Neck: Normal range of motion. Neck supple.  Cardiovascular: Normal rate and regular rhythm.   Pulmonary/Chest: Effort normal and breath sounds normal.  Abdominal: Soft. Bowel sounds are normal.          Assessment & Plan:  Atypical chest pain but with risk factors of age family history of adult-onset diabetes as well as hypertension and mild hyperlipidemia the patient has enough risk factors to be of concern that we should get a cardiologist involved in her consultation.  The Myoview being negative in the fall of 2011 is reassuring as well as the fact that the patient was highly active prior to the episode and had some reproducible chest palpable tenderness we still feel that it would be important to get help with risk stratification.  There was calcified plaque is seen on the left anterior descending artery on a cardiac score CT and a cardiac catheterization for further delineation may be the next step   At this point the patient is hesitant to add a beta blocker to his regimen since he had a prior experience in the hospital when he was given nitroglycerin and developed hypotension he also developed hypotension when I increased the lisinopril from 5-10. The we will not give him a beta blocker at this point we lies in bed if the cardiac catheterization shows significant progression of  any lesion that this would be a part of his therapy.  His total cholesterol and LDL cholesterol have been at goal but his HDL cholesterol has been low and if this CT scan does indicate progression of a plaque titer control of cholesterol is an essential preventative step therefore we will begin twice a week statin therapy

## 2011-03-11 ENCOUNTER — Encounter: Payer: Self-pay | Admitting: *Deleted

## 2011-03-12 ENCOUNTER — Ambulatory Visit (INDEPENDENT_AMBULATORY_CARE_PROVIDER_SITE_OTHER): Payer: 59 | Admitting: Cardiology

## 2011-03-12 ENCOUNTER — Encounter: Payer: Self-pay | Admitting: Cardiology

## 2011-03-12 DIAGNOSIS — E785 Hyperlipidemia, unspecified: Secondary | ICD-10-CM

## 2011-03-12 DIAGNOSIS — I2510 Atherosclerotic heart disease of native coronary artery without angina pectoris: Secondary | ICD-10-CM | POA: Insufficient documentation

## 2011-03-12 DIAGNOSIS — I10 Essential (primary) hypertension: Secondary | ICD-10-CM

## 2011-03-12 NOTE — Assessment & Plan Note (Signed)
Patient's symptoms at the time of his evaluation were somewhat atypical. Cardiac markers were negative. Cardiac CT shows a 50% LAD. No further invasive workup indicated as he has had no further symptoms. He needs aggressive medical therapy. Continue aspirin, ACE inhibitor and statin. Continue diet and exercise. Patient instructed on symptoms of exertional chest pain and dyspnea.

## 2011-03-12 NOTE — Assessment & Plan Note (Signed)
Continue ACE inhibitor 

## 2011-03-12 NOTE — Patient Instructions (Signed)
Your physician wants you to follow-up in:  6 months. You will receive a reminder letter in the mail two months in advance. If you don't receive a letter, please call our office to schedule the follow-up appointment.   

## 2011-03-12 NOTE — Progress Notes (Signed)
HPI: 60 year old male for evaluation of chest pain. Nuclear study in October of 2011 showed an ejection fraction of 53% and normal perfusion. Cardiac CT in July of 2012 revealed Mild to moderate coronary artery disease.  The patient's total coronary artery calcium score is 28.9, which is 42nd percentile for patient's matched age and gender. The calcium is all part of a mixed plaque in the proximal LAD which causes less than 50% stenosis. On July 30 the patient was seen in the emergency room for chest pain. It was described as a tightness without radiation. No associated symptoms. Pain was not pleuritic, exertional or related to food. It increased with lying flat. Cardiac markers, d-dimer and chest x-ray were negative. Liver functions and hemoglobin were normal. The patient had the above CT prior to discharge. Since then he denies chest pain, dyspnea, pedal edema, claudication or syncope.  Current Outpatient Prescriptions  Medication Sig Dispense Refill  . aspirin 81 MG tablet Take 81 mg by mouth daily.        . chlorzoxazone (PARAFON FORTE DSC) 500 MG tablet Take 1 tablet (500 mg total) by mouth 4 (four) times daily as needed for muscle spasms.  30 tablet  0  . etodolac (LODINE) 400 MG tablet Take 1 tablet (400 mg total) by mouth 2 (two) times daily.  60 tablet  3  . insulin aspart protamine-insulin aspart (NOVOLOG 70/30) (70-30) 100 UNIT/ML injection Inject into the skin. Ac meals sliding scale usually only takes 15 units before lunch       . insulin detemir (LEVEMIR) 100 UNIT/ML injection Inject 55 Units into the skin at bedtime.  49.5 mL  0  . lisinopril (PRINIVIL,ZESTRIL) 5 MG tablet TAKE ONE TABLET BY MOUTH EVERY DAY  90 tablet  3  . metFORMIN (GLUCOPHAGE-XR) 500 MG 24 hr tablet TAKE TWO TABLETS BY MOUTH TWICE DAILY  60 tablet  6  . NON FORMULARY Mega red joint care once a day       . NON FORMULARY Mega red creel ol once a day       . NON FORMULARY Diabetes nutrition pack once a day       .  Pitavastatin Calcium (LIVALO) 2 MG TABS Take 1 tablet (2 mg total) by mouth 2 (two) times a week.  30 tablet  2  . Tamsulosin HCl (FLOMAX) 0.4 MG CAPS Take 0.4 mg by mouth daily.        [name] DISCONTD: insulin aspart protamine-insulin aspart (NOVOLOG MIX 70/30 FLEXPEN) (70-30) 100 UNIT/ML injection Inject 6 Units into the skin 2 (two) times daily. 15units in am and 35 units in am  10 mL  12    Allergies  Allergen Reactions  . Demerol   . Meperidine Hcl   . Nitrospan (Nitroglycerin)     Past Medical History  Diagnosis Date  . Diabetes mellitus   . Hypertension   . Hyperlipidemia   . Chronic kidney disease   . Renal calculus or stone     Past Surgical History  Procedure Date  . Left knee replacement   . Tonsillectomy   . Lithotripsy   . Laminectomy     History   Social History  . Marital Status: Married    Spouse Name: N/A    Number of Children: 2  . Years of Education: N/A   Occupational History  .      Retired   Social History Main Topics  . Smoking status: Never Smoker   . Smokeless tobacco:  Not on file  . Alcohol Use: No  . Drug Use: No  . Sexually Active: Yes   Other Topics Concern  . Not on file   Social History Narrative  . No narrative on file    Family History  Problem Relation Age of Onset  . Heart disease Brother     Atrial fibrillation  . Heart disease Father     CHF    ROS: Some problems with back pain but no fevers or chills, productive cough, hemoptysis, dysphasia, odynophagia, melena, hematochezia, dysuria, hematuria, rash, seizure activity, orthopnea, PND, pedal edema, claudication. Remaining systems are negative.  Physical Exam: General:  Well developed/well nourished in NAD Skin warm/dry Patient not depressed No peripheral clubbing Back-normal HEENT-normal/normal eyelids Neck supple/normal carotid upstroke bilaterally; no bruits; no JVD; no thyromegaly chest - CTA/ normal expansion CV - RRR/normal S1 and S2; no murmurs, rubs or  gallops;  PMI nondisplaced Abdomen -NT/ND, no HSM, no mass, + bowel sounds, no bruit 2+ femoral pulses, no bruits Ext-no edema, chords, 2+ DP Neuro-grossly nonfocal

## 2011-03-12 NOTE — Assessment & Plan Note (Addendum)
Continue statin. Goal LDL less than 70. Monitored by primary care.

## 2011-03-21 ENCOUNTER — Other Ambulatory Visit: Payer: Self-pay | Admitting: Internal Medicine

## 2011-04-03 ENCOUNTER — Ambulatory Visit: Payer: 59 | Admitting: Internal Medicine

## 2011-04-04 ENCOUNTER — Telehealth: Payer: Self-pay | Admitting: *Deleted

## 2011-04-04 ENCOUNTER — Other Ambulatory Visit: Payer: Self-pay | Admitting: Internal Medicine

## 2011-04-04 MED ORDER — INSULIN DETEMIR 100 UNIT/ML ~~LOC~~ SOLN: 55.0000 [IU] | Freq: Every day | SUBCUTANEOUS | Status: DC

## 2011-04-04 NOTE — Telephone Encounter (Signed)
Opened in error

## 2011-04-04 NOTE — Telephone Encounter (Signed)
Pt req refill of insulin detemir (LEVEMIR) 100 UNIT/ML injection to [facility] order pharmacy.

## 2011-04-08 ENCOUNTER — Other Ambulatory Visit: Payer: Self-pay | Admitting: *Deleted

## 2011-04-08 ENCOUNTER — Other Ambulatory Visit: Payer: Self-pay | Admitting: Internal Medicine

## 2011-04-08 MED ORDER — METFORMIN HCL ER 500 MG PO TB24: ORAL_TABLET | ORAL | Status: DC

## 2011-04-08 NOTE — Telephone Encounter (Signed)
Pt called and says that [facility] in [place] told pt that there are no refills remaining on metFORMIN (GLUCOPHAGE-XR) 500 MG 24 hr tablet. Pt is completely out of med. Pls call in [phone]

## 2011-04-30 ENCOUNTER — Other Ambulatory Visit: Payer: Self-pay | Admitting: Internal Medicine

## 2011-04-30 ENCOUNTER — Other Ambulatory Visit (INDEPENDENT_AMBULATORY_CARE_PROVIDER_SITE_OTHER): Payer: 59

## 2011-04-30 DIAGNOSIS — E109 Type 1 diabetes mellitus without complications: Secondary | ICD-10-CM

## 2011-04-30 LAB — HEMOGLOBIN A1C: Hgb A1c MFr Bld: 7.9 % — ABNORMAL HIGH (ref 4.6–6.5)

## 2011-04-30 NOTE — Progress Notes (Signed)
Addended by: Bonnye Fava on: 04/30/2011 08:04 AM   Modules accepted: Orders

## 2011-05-05 ENCOUNTER — Ambulatory Visit: Payer: 59 | Attending: Neurosurgery | Admitting: Physical Therapy

## 2011-05-05 DIAGNOSIS — M546 Pain in thoracic spine: Secondary | ICD-10-CM | POA: Insufficient documentation

## 2011-05-05 DIAGNOSIS — IMO0001 Reserved for inherently not codable concepts without codable children: Secondary | ICD-10-CM | POA: Insufficient documentation

## 2011-05-05 DIAGNOSIS — M542 Cervicalgia: Secondary | ICD-10-CM | POA: Insufficient documentation

## 2011-05-05 DIAGNOSIS — R5381 Other malaise: Secondary | ICD-10-CM | POA: Insufficient documentation

## 2011-05-09 ENCOUNTER — Ambulatory Visit: Payer: 59 | Admitting: *Deleted

## 2011-05-12 ENCOUNTER — Ambulatory Visit (INDEPENDENT_AMBULATORY_CARE_PROVIDER_SITE_OTHER): Payer: 59 | Admitting: Internal Medicine

## 2011-05-12 ENCOUNTER — Encounter: Payer: Self-pay | Admitting: Internal Medicine

## 2011-05-12 ENCOUNTER — Encounter: Payer: 59 | Admitting: Physical Therapy

## 2011-05-12 VITALS — BP 130/81 | HR 76 | Temp 98.2°F | Resp 16 | Ht 76.0 in | Wt 237.0 lb

## 2011-05-12 DIAGNOSIS — E109 Type 1 diabetes mellitus without complications: Secondary | ICD-10-CM

## 2011-05-12 DIAGNOSIS — E785 Hyperlipidemia, unspecified: Secondary | ICD-10-CM

## 2011-05-12 DIAGNOSIS — T887XXA Unspecified adverse effect of drug or medicament, initial encounter: Secondary | ICD-10-CM

## 2011-05-12 DIAGNOSIS — Z23 Encounter for immunization: Secondary | ICD-10-CM

## 2011-05-12 DIAGNOSIS — I10 Essential (primary) hypertension: Secondary | ICD-10-CM

## 2011-05-12 DIAGNOSIS — M542 Cervicalgia: Secondary | ICD-10-CM

## 2011-05-12 DIAGNOSIS — G8929 Other chronic pain: Secondary | ICD-10-CM

## 2011-05-12 NOTE — Progress Notes (Signed)
  Subjective:    Patient ID: Nathaniel Thomas, male    DOB: 08-19-1950, 60 y.o.   MRN: [id number]  HPI Patient's persistent back and the pain is multifactorial does have degenerative disc disease but has some gait and balance issues. He is followed by Dr. Delma Officer  He has insulin requiring diabetes that has been very labile. CBGs ranged from normal to mid 100s He is cardiovascular risk factors including hyperlipidemia hypertension diabetes and age   Review of Systems  Constitutional: Negative for fever and fatigue.  HENT: Negative for hearing loss, congestion, neck pain and postnasal drip.   Eyes: Negative for discharge, redness and visual disturbance.  Respiratory: Negative for cough, shortness of breath and wheezing.   Cardiovascular: Negative for leg swelling.  Gastrointestinal: Negative for abdominal pain, constipation and abdominal distention.  Genitourinary: Negative for urgency and frequency.  Musculoskeletal: Negative for joint swelling and arthralgias.  Skin: Negative for color change and rash.  Neurological: Negative for weakness and light-headedness.  Hematological: Negative for adenopathy.  Psychiatric/Behavioral: Negative for behavioral problems.   Past Medical History  Diagnosis Date  . Diabetes mellitus   . Hypertension   . Hyperlipidemia   . Chronic kidney disease   . Renal calculus or stone    Past Surgical History  Procedure Date  . Left knee replacement   . Tonsillectomy   . Lithotripsy   . Laminectomy     reports that he has never smoked. He does not have any smokeless tobacco history on file. He reports that he does not drink alcohol or use illicit drugs. family history includes Heart disease in his brother and father. Allergies  Allergen Reactions  . Demerol   . Meperidine Hcl   . Nitrospan (Nitroglycerin)        Objective:   Physical Exam  Nursing note and vitals reviewed. Constitutional: He appears well-developed and well-nourished.  HENT:    Head: Normocephalic and atraumatic.  Eyes: Conjunctivae are normal. Pupils are equal, round, and reactive to light.  Neck: Normal range of motion. Neck supple.  Cardiovascular: Normal rate and regular rhythm.   Pulmonary/Chest: Effort normal and breath sounds normal.  Abdominal: Soft. Bowel sounds are normal.          Assessment & Plan:  Katha Cabal for back pain Was started on neurontin  And could not tolerate, was sent to PT for neck pain Blood pressure stable No chest pain or symptoms of cardiovascular disease at this point we discussed the joints given his multiple risk factors Screening lipids today

## 2011-05-13 ENCOUNTER — Ambulatory Visit: Payer: 59 | Admitting: Physical Therapy

## 2011-05-15 ENCOUNTER — Ambulatory Visit: Payer: 59 | Admitting: *Deleted

## 2011-05-20 ENCOUNTER — Ambulatory Visit: Payer: 59 | Admitting: *Deleted

## 2011-05-22 ENCOUNTER — Ambulatory Visit: Payer: 59 | Admitting: *Deleted

## 2011-05-22 ENCOUNTER — Emergency Department (HOSPITAL_COMMUNITY)
Admission: EM | Admit: 2011-05-22 | Discharge: 2011-05-22 | Disposition: A | Discharge status: Other Acute Inpatient Hospital | Payer: 59 | Attending: Emergency Medicine | Admitting: Emergency Medicine

## 2011-05-22 ENCOUNTER — Emergency Department (HOSPITAL_COMMUNITY): Payer: 59

## 2011-05-22 DIAGNOSIS — Z79899 Other long term (current) drug therapy: Secondary | ICD-10-CM | POA: Insufficient documentation

## 2011-05-22 DIAGNOSIS — N1330 Unspecified hydronephrosis: Secondary | ICD-10-CM | POA: Insufficient documentation

## 2011-05-22 DIAGNOSIS — N201 Calculus of ureter: Secondary | ICD-10-CM | POA: Insufficient documentation

## 2011-05-22 DIAGNOSIS — E119 Type 2 diabetes mellitus without complications: Secondary | ICD-10-CM | POA: Insufficient documentation

## 2011-05-22 DIAGNOSIS — Z794 Long term (current) use of insulin: Secondary | ICD-10-CM | POA: Insufficient documentation

## 2011-05-22 DIAGNOSIS — Z96659 Presence of unspecified artificial knee joint: Secondary | ICD-10-CM | POA: Insufficient documentation

## 2011-05-22 DIAGNOSIS — Z7982 Long term (current) use of aspirin: Secondary | ICD-10-CM | POA: Insufficient documentation

## 2011-05-22 LAB — DIFFERENTIAL
Basophils Absolute: 0 10*3/uL (ref 0.0–0.1)
Basophils Relative: 0 % (ref 0–1)
Eosinophils Absolute: 0.1 10*3/uL (ref 0.0–0.7)
Eosinophils Relative: 1 % (ref 0–5)
Lymphocytes Relative: 9 % — ABNORMAL LOW (ref 12–46)
Lymphs Abs: 1.2 10*3/uL (ref 0.7–4.0)
Monocytes Absolute: 0.7 10*3/uL (ref 0.1–1.0)
Monocytes Relative: 5 % (ref 3–12)
Neutro Abs: 11.8 10*3/uL — ABNORMAL HIGH (ref 1.7–7.7)
Neutrophils Relative %: 85 % — ABNORMAL HIGH (ref 43–77)

## 2011-05-22 LAB — CBC
HCT: 41.7 % (ref 39.0–52.0)
Hemoglobin: 14 g/dL (ref 13.0–17.0)
MCH: 30.2 pg (ref 26.0–34.0)
MCHC: 33.6 g/dL (ref 30.0–36.0)
MCV: 89.9 fL (ref 78.0–100.0)
Platelets: 209 10*3/uL (ref 150–400)
RBC: 4.64 MIL/uL (ref 4.22–5.81)
RDW: 13.3 % (ref 11.5–15.5)
WBC: 13.8 10*3/uL — ABNORMAL HIGH (ref 4.0–10.5)

## 2011-05-22 LAB — URINALYSIS, ROUTINE W REFLEX MICROSCOPIC
Glucose, UA: >1000 mg/dL — AB
Ketones, ur: 15 mg/dL — AB
Leukocytes, UA: NEGATIVE
Nitrite: NEGATIVE
Protein, ur: NEGATIVE mg/dL
Specific Gravity, Urine: 1.024 (ref 1.005–1.030)
Urobilinogen, UA: 0.2 mg/dL (ref 0.0–1.0)
pH: 5.5 (ref 5.0–8.0)

## 2011-05-22 LAB — BASIC METABOLIC PANEL
BUN: 17 mg/dL (ref 6–23)
CO2: 25 mEq/L (ref 19–32)
Calcium: 9.4 mg/dL (ref 8.4–10.5)
Chloride: 100 mEq/L (ref 96–112)
Creatinine, Ser: 0.94 mg/dL (ref 0.50–1.35)
GFR calc Af Amer: >90 mL/min (ref >90)
GFR calc non Af Amer: 88 mL/min — ABNORMAL LOW (ref >90)
Glucose, Bld: 238 mg/dL — ABNORMAL HIGH (ref 70–99)
Potassium: 4.1 mEq/L (ref 3.5–5.1)
Sodium: 136 mEq/L (ref 135–145)

## 2011-05-22 LAB — URINE MICROSCOPIC-ADD ON

## 2011-05-22 LAB — GLUCOSE, CAPILLARY: Glucose-Capillary: 253 mg/dL — ABNORMAL HIGH (ref 70–99)

## 2011-05-22 IMAGING — CT CT ABD-PELV W/O CM
1 of 2 series · 16 of 32 positions shown, 20 images · non-contrast
Comparison: [DATE]

CLINICAL DATA: Left flank pain.  Nausea and vomiting.
Nephrolithiasis.

CT ABDOMEN AND PELVIS WITHOUT CONTRAST
TECHNIQUE: Multidetector CT imaging of the abdomen and pelvis was
performed following the standard protocol without intravenous
contrast.

[Series 2: abd/pel w/o · axial · non-contrast · 0.80mm/px · z∈[-318,+168]mm · 16 of 107 slices shown, 20 images]
[im 5/107  soft-tissue]
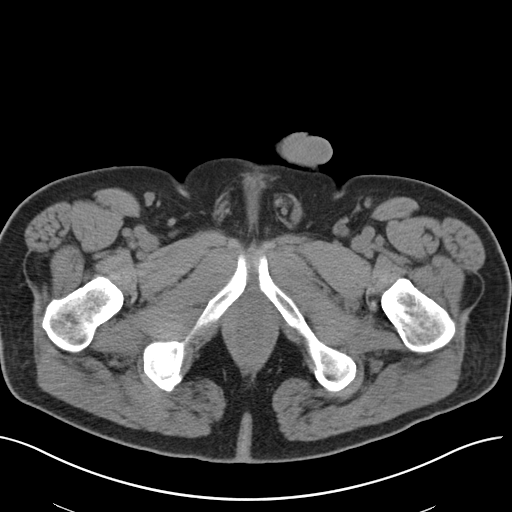
[im 5/107  bone]
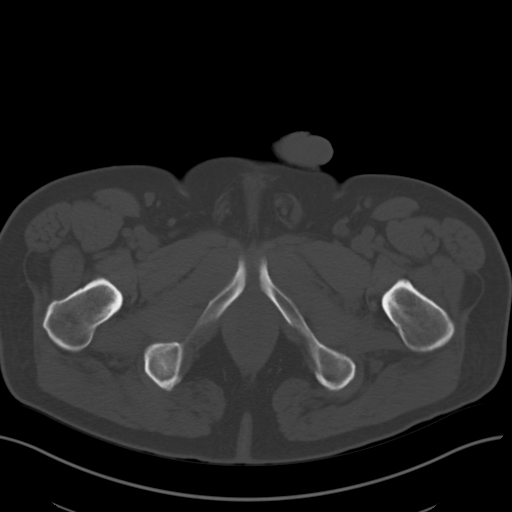
[im 13/107  soft-tissue]
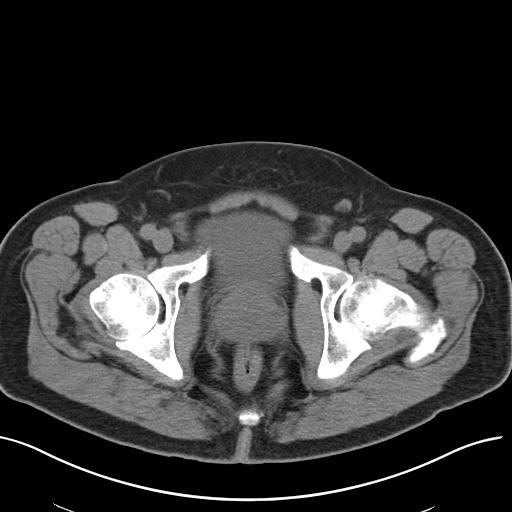
[im 22/107  soft-tissue]
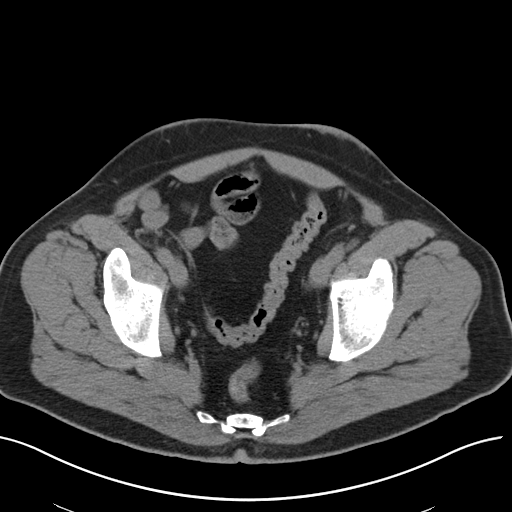
[im 30/107  soft-tissue]
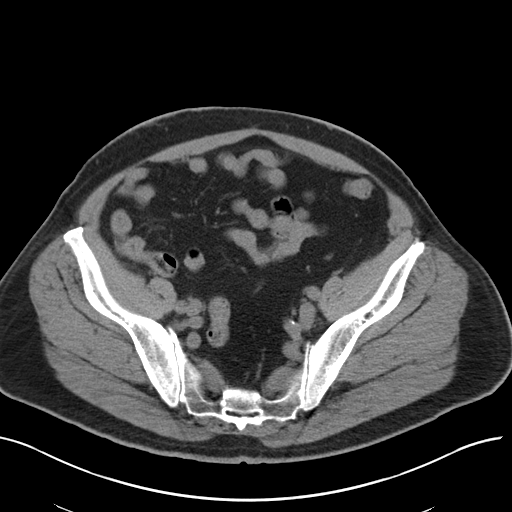
[im 34/107  soft-tissue]
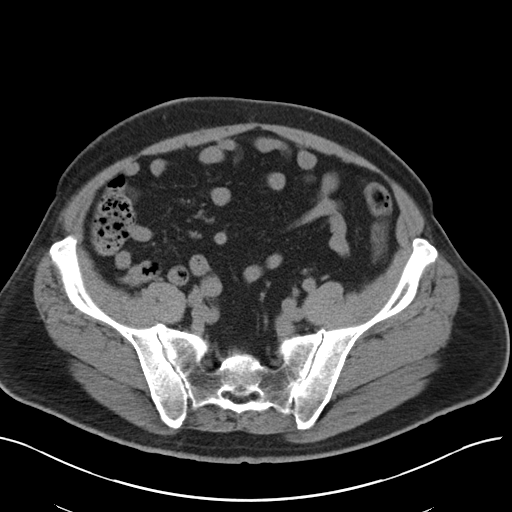
[im 43/107  soft-tissue]
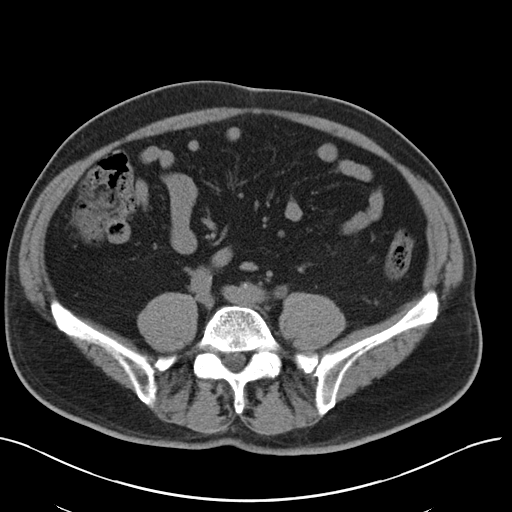
[im 51/107  soft-tissue]
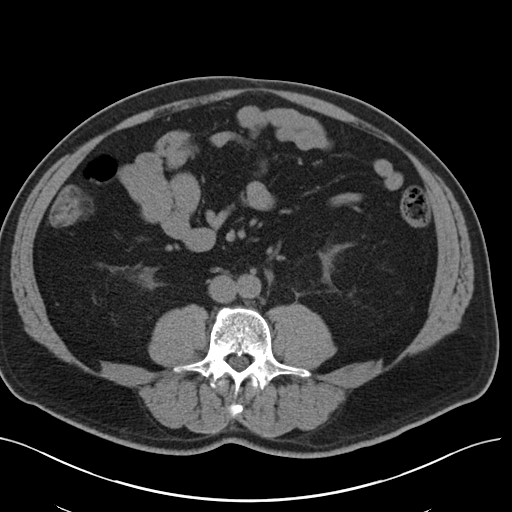
[im 56/107  soft-tissue]
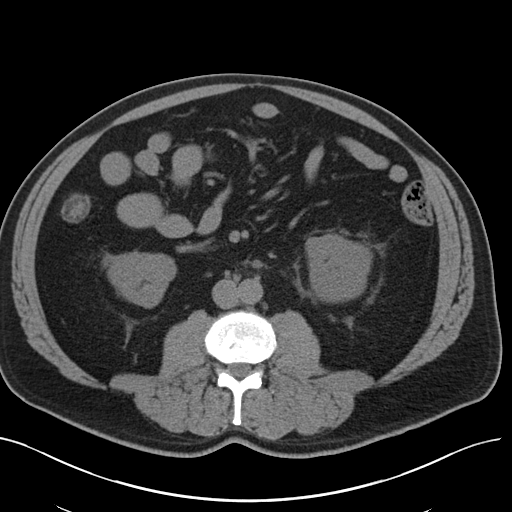
[im 64/107  soft-tissue]
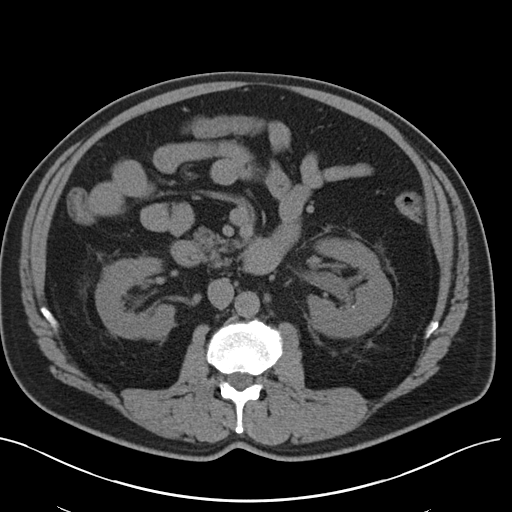
[im 64/107  bone]
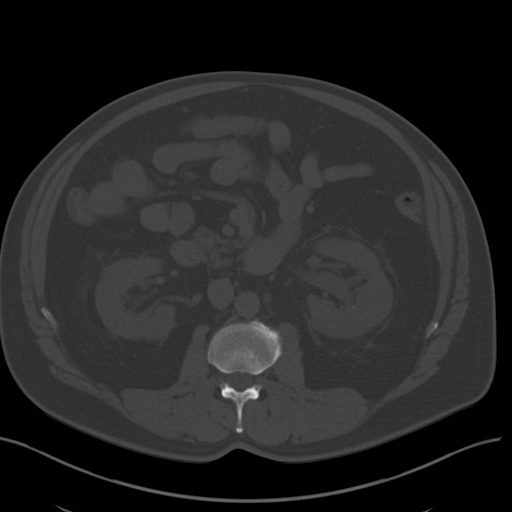
[im 73/107  soft-tissue]
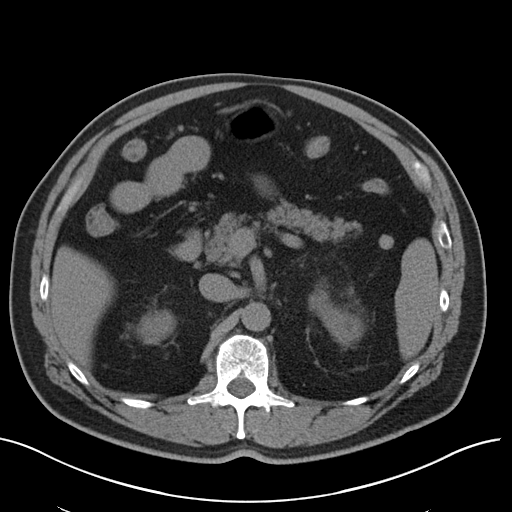
[im 81/107  soft-tissue]
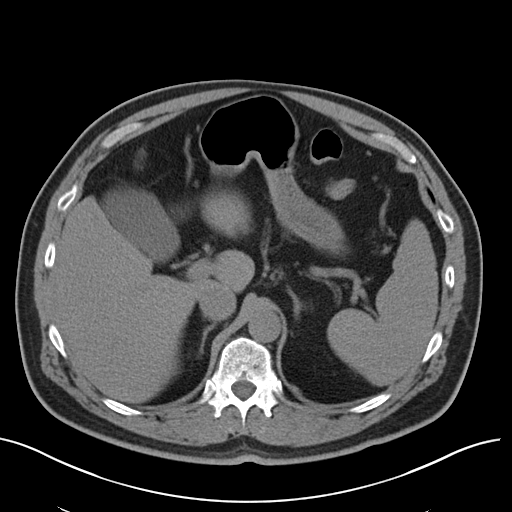
[im 85/107  soft-tissue]
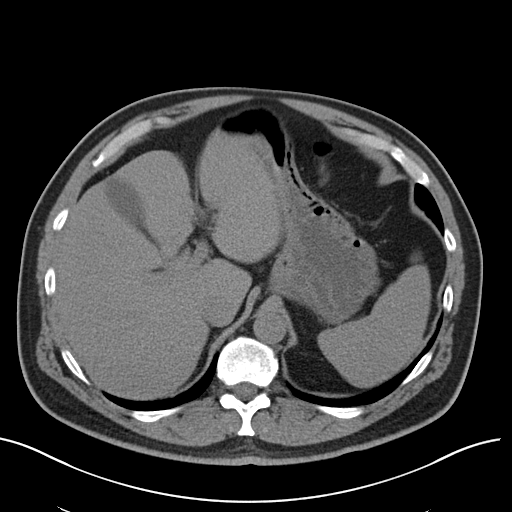
[im 90/107  lung]
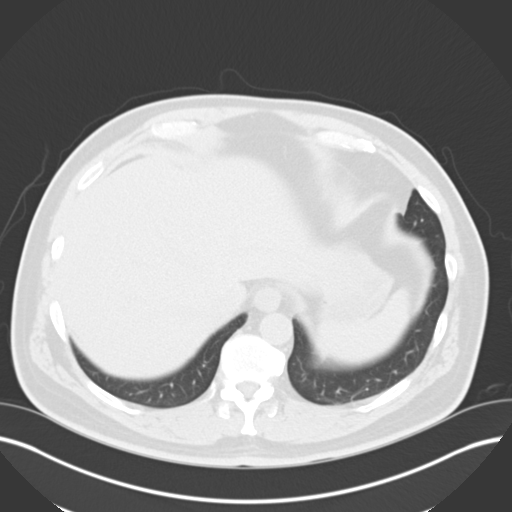
[im 94/107  soft-tissue]
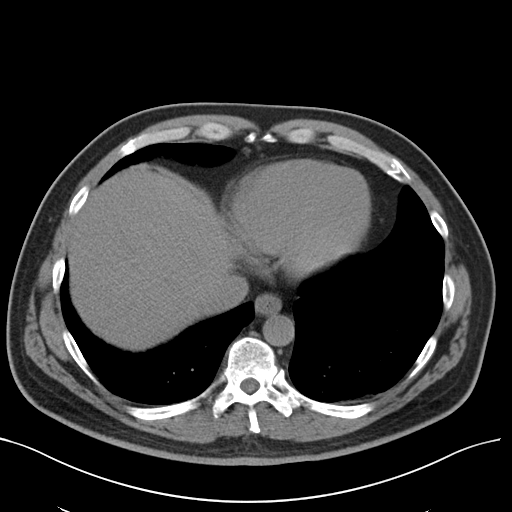
[im 94/107  lung]
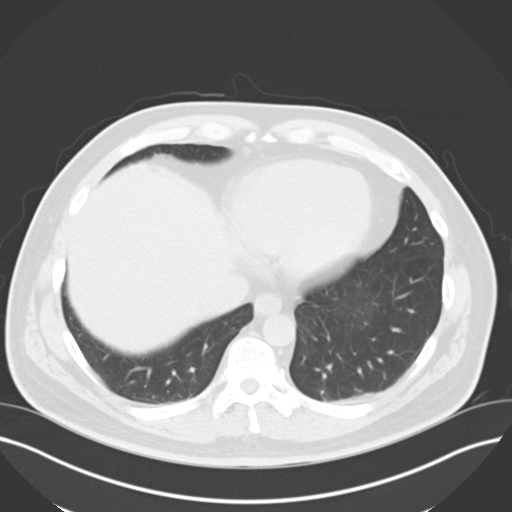
[im 98/107  lung]
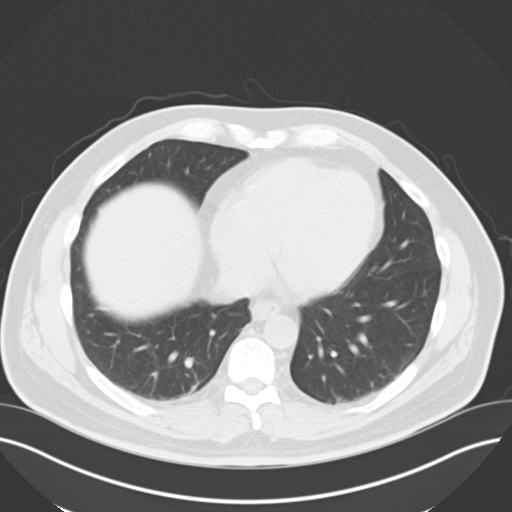
[im 102/107  soft-tissue]
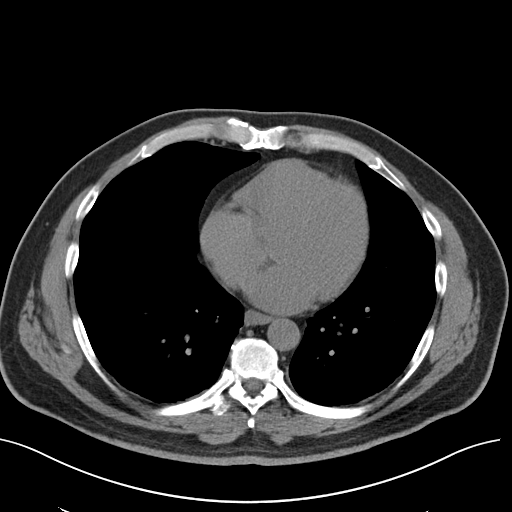
[im 102/107  lung]
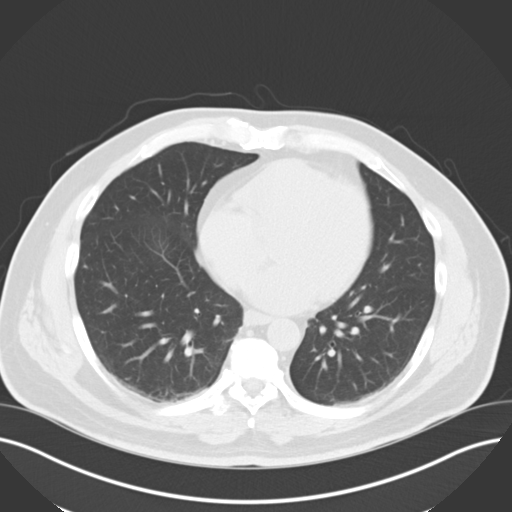

[16 of 32 positions shown; findings below may reference images not displayed]

FINDINGS: Multiple small less than 5 mm intrarenal calculi are
again seen bilaterally.  Mild right hydronephrosis and
ureterectasis is seen.  A 7 mm calculus is seen in the mid-left
ureter overlying the sacrum.  No other ureteral calculi are
identified.  Mildly enlarged prostate gland is noted.

The other abdominal parenchymal organs are normal in appearance on
this noncontrast study.  No mass or lymphadenopathy identified.  No
evidence of inflammatory process or abnormal fluid collections.
IMPRESSION: 1.  7 mm left mid ureteral calculus with mild left hydronephrosis.
2.  Bilateral nephrolithiasis.
3.  Mildly enlarged prostate.

## 2011-05-23 NOTE — Consult Note (Addendum)
NAME:  CLAIRE, BRIDGE NO.:  [id number]  MEDICAL RECORD NO.:  [id number]  LOCATION: ED                              FACILITY:  [facility]  PHYSICIAN:  Binnie Droessler C. Vernie Ammons, M.D.  DATE OF BIRTH:  [birth year]  DATE OF CONSULTATION:  05/22/2011 DATE OF DISCHARGE:                                CONSULTATION   HISTORY OF PRESENT ILLNESS:  Mr. Cato is a 60 year old male, who presented to the emergency room today with acute onset of left flank pain.  He has a past history of multiple nephrolithiasis and has passed multiple stones spontaneously previously.  He developed left flank pain that radiated to the lower abdomen and inguinal region.  It was not associated with any fever.  It was quite severe and was not changed by position.  There was no associated hematuria or change in his voiding pattern.  He has not seen any stone passed recently.  He has required previous ureteroscopic extraction of stones.  PAST MEDICAL HISTORY:  Positive for insulin-dependent diabetes and nephrolithiasis.  SURGICAL HISTORY:  He has had back surgery, knee replacement, and ureteroscopic extraction of the stone on the left-hand side in April 2005.  SOCIAL HISTORY:  He does not smoke or drink.  FAMILY HISTORY:  Negative for GU malignancy or renal disease.  ALLERGIES:  DEMEROL caused anaphylaxis.  REVIEW OF SYSTEMS:  Negative other than as noted above.  PHYSICAL EXAMINATION:  GENERAL:  The patient is a well-developed, well- nourished white male, in no apparent distress. HEENT:  Atraumatic, normocephalic.  Oropharynx is clear. NECK:  Supple with midline trachea. CHEST:  Normal respiratory effort.  CARDIOVASCULAR:  Regular rate and rhythm.  ABDOMEN:  Soft, nontender, without mass or HSM.  He has mild left CVAT to percussion. GU: Normal male external genitalia without lesion.  EXTREMITIES: Without clubbing, cyanosis, edema. NEUROLOGIC:  He is alert and oriented with appropriate mood and  affect. SKIN:  Warm and dry.  DIAGNOSTIC STUDIES:  CT scan of the images were independently reviewed. He has bilateral nephrolithiasis, all measuring less than 5 mm in size, as well as a 7 mm mid left ureteral stone.  There is BPH, but no other significant urologic pathology is identified on his scan.  His urinalysis had 21 to 50 red blood cells and 3 to 6 white blood cells, with blood and glucose, but was otherwise negative.  His serum glucose was found to be 238.  His creatinine is 0.94.  His serum calcium was noted be normal at 9.4.  His CBC revealed a white count of 13.8, normal hemoglobin of 14.0.  IMPRESSION: 1. Bilateral nonobstructing renal calculi. 2. Left mid ureteral stone.  The patient was having severe pain     initially and it was difficult to control with parenteral pain     medication including narcotics and Toradol; however, when I     eventually saw him in the emergency room, after his IV had been     slowed from 120 mL an hour, he was having minimal pain, rating his     pain at that time, 2/10.  I therefore discussed with the patient  the treatment options.  I told him the first would be to admit him     to the hospital for pain control.  The next would be to take him to     the operating room for ureteroscopic treatment of the stone with     laser lithotripsy and likely stent after the procedure, and the     third option would be attempted medical expulsive therapy with     possible lithotripsy.  He has had a previous ureteroscopy and had a     bad experience and would like to try to avoid that, although he     said he would like to get rid of his stone.  He elected to proceed     with medical expulsive therapy and a prescription for pain     medication with the plan to follow up with me in the office next     week for KUB, and if his stone is visible, then we could consider     proceeding with lithotripsy.  PLAN: 1. Initiate medical expulsive therapy using  tamsulosin 0.4 mg.  A     prescription for 30 was given today. 2. Dilaudid 4 mg tablets #38. 3. He is going to force fluids and strain his urine. 4. He is going to return to see me next week for followup.     Mancel Lardizabal C. Vernie Ammons, M.D.     MCO/MEDQ  D:  05/22/2011  T:  05/22/2011  Job:  [id number]  Electronically Signed by Ihor Gully M.D. on 05/23/2011 06:05:01 PM

## 2011-05-27 ENCOUNTER — Ambulatory Visit: Payer: 59 | Admitting: Physical Therapy

## 2011-05-29 ENCOUNTER — Ambulatory Visit: Payer: 59 | Admitting: *Deleted

## 2011-06-03 ENCOUNTER — Encounter: Payer: 59 | Admitting: *Deleted

## 2011-06-05 ENCOUNTER — Ambulatory Visit: Payer: 59 | Attending: Neurosurgery | Admitting: *Deleted

## 2011-06-05 DIAGNOSIS — M542 Cervicalgia: Secondary | ICD-10-CM | POA: Insufficient documentation

## 2011-06-05 DIAGNOSIS — IMO0001 Reserved for inherently not codable concepts without codable children: Secondary | ICD-10-CM | POA: Insufficient documentation

## 2011-06-05 DIAGNOSIS — R5381 Other malaise: Secondary | ICD-10-CM | POA: Insufficient documentation

## 2011-06-05 DIAGNOSIS — M546 Pain in thoracic spine: Secondary | ICD-10-CM | POA: Insufficient documentation

## 2011-06-10 ENCOUNTER — Ambulatory Visit: Payer: 59 | Admitting: *Deleted

## 2011-06-10 ENCOUNTER — Telehealth (HOSPITAL_COMMUNITY): Payer: Self-pay | Admitting: *Deleted

## 2011-06-12 ENCOUNTER — Ambulatory Visit: Payer: 59 | Admitting: *Deleted

## 2011-06-16 ENCOUNTER — Ambulatory Visit (HOSPITAL_COMMUNITY): Admission: RE | Admit: 2011-06-16 | Payer: 59 | Source: Ambulatory Visit | Admitting: Urology

## 2011-06-16 ENCOUNTER — Encounter (HOSPITAL_COMMUNITY): Admission: RE | Payer: Self-pay | Source: Ambulatory Visit

## 2011-06-16 ENCOUNTER — Other Ambulatory Visit: Payer: Self-pay | Admitting: Internal Medicine

## 2011-06-16 SURGERY — LITHOTRIPSY, ESWL
Anesthesia: LOCAL | Laterality: Left

## 2011-06-17 ENCOUNTER — Ambulatory Visit: Payer: 59 | Admitting: Physical Therapy

## 2011-06-19 ENCOUNTER — Ambulatory Visit: Payer: 59 | Admitting: Physical Therapy

## 2011-06-21 ENCOUNTER — Other Ambulatory Visit: Payer: Self-pay | Admitting: Internal Medicine

## 2011-07-30 ENCOUNTER — Telehealth: Payer: Self-pay | Admitting: Internal Medicine

## 2011-07-30 NOTE — Telephone Encounter (Signed)
Pt experiencing Cough, pain in chest from coughing, congestion, no fever, body aches and fatigue and would like a z-pack called in.

## 2011-07-30 NOTE — Telephone Encounter (Signed)
Pt aware Dr. Lovell Sheehan is out of the office until tomorrow. Pt states he does not want to be seen today. Pls advise.

## 2011-07-31 ENCOUNTER — Telehealth: Payer: Self-pay | Admitting: Internal Medicine

## 2011-07-31 MED ORDER — AZITHROMYCIN 250 MG PO TABS: ORAL_TABLET | ORAL | Status: AC

## 2011-07-31 NOTE — Telephone Encounter (Signed)
Pt called and is still req a zpak to be called in to [facility] in [place]. Pt has cough and chest congestion. Pt aware pcp out of office. Did not want to sch ov.

## 2011-07-31 NOTE — Telephone Encounter (Signed)
Called in and pt informed.

## 2011-07-31 NOTE — Telephone Encounter (Signed)
Ok to send in z pack per dr Lovell Sheehan

## 2011-08-06 NOTE — Patient Instructions (Signed)
The patient is instructed to continue all medications as prescribed. Schedule followup with check out clerk upon leaving the clinic  

## 2011-08-21 ENCOUNTER — Other Ambulatory Visit: Payer: Self-pay | Admitting: Internal Medicine

## 2011-08-26 ENCOUNTER — Other Ambulatory Visit: Payer: Self-pay | Admitting: Internal Medicine

## 2011-09-12 ENCOUNTER — Other Ambulatory Visit (INDEPENDENT_AMBULATORY_CARE_PROVIDER_SITE_OTHER): Payer: 59

## 2011-09-12 DIAGNOSIS — I10 Essential (primary) hypertension: Secondary | ICD-10-CM

## 2011-09-12 DIAGNOSIS — E785 Hyperlipidemia, unspecified: Secondary | ICD-10-CM

## 2011-09-12 DIAGNOSIS — E109 Type 1 diabetes mellitus without complications: Secondary | ICD-10-CM

## 2011-09-12 DIAGNOSIS — T887XXA Unspecified adverse effect of drug or medicament, initial encounter: Secondary | ICD-10-CM

## 2011-09-12 LAB — CBC WITH DIFFERENTIAL/PLATELET
Basophils Absolute: 0 10*3/uL (ref 0.0–0.1)
Basophils Relative: 0.4 % (ref 0.0–3.0)
Eosinophils Absolute: 0.2 10*3/uL (ref 0.0–0.7)
Eosinophils Relative: 3.6 % (ref 0.0–5.0)
HCT: 40.7 % (ref 39.0–52.0)
Hemoglobin: 13.6 g/dL (ref 13.0–17.0)
Lymphocytes Relative: 29.5 % (ref 12.0–46.0)
Lymphs Abs: 1.7 10*3/uL (ref 0.7–4.0)
MCHC: 33.5 g/dL (ref 30.0–36.0)
MCV: 92 fl (ref 78.0–100.0)
Monocytes Absolute: 0.7 10*3/uL (ref 0.1–1.0)
Monocytes Relative: 11.4 % (ref 3.0–12.0)
Neutro Abs: 3.3 10*3/uL (ref 1.4–7.7)
Neutrophils Relative %: 55.1 % (ref 43.0–77.0)
Platelets: 220 10*3/uL (ref 150.0–400.0)
RBC: 4.42 Mil/uL (ref 4.22–5.81)
RDW: 14.4 % (ref 11.5–14.6)
WBC: 5.9 10*3/uL (ref 4.5–10.5)

## 2011-09-12 LAB — BASIC METABOLIC PANEL
BUN: 19 mg/dL (ref 6–23)
CO2: 30 mEq/L (ref 19–32)
Calcium: 9 mg/dL (ref 8.4–10.5)
Chloride: 101 mEq/L (ref 96–112)
Creatinine, Ser: 1 mg/dL (ref 0.4–1.5)
GFR: 77.82 mL/min (ref >60.00)
Glucose, Bld: 149 mg/dL — ABNORMAL HIGH (ref 70–99)
Potassium: 5.2 mEq/L — ABNORMAL HIGH (ref 3.5–5.1)
Sodium: 135 mEq/L (ref 135–145)

## 2011-09-12 LAB — LIPID PANEL
Cholesterol: 139 mg/dL (ref 0–200)
HDL: 34.4 mg/dL — ABNORMAL LOW (ref >39.00)
LDL Cholesterol: 83 mg/dL (ref 0–99)
Total CHOL/HDL Ratio: 4
Triglycerides: 108 mg/dL (ref 0.0–149.0)
VLDL: 21.6 mg/dL (ref 0.0–40.0)

## 2011-09-12 LAB — HEPATIC FUNCTION PANEL
ALT: 25 U/L (ref 0–53)
AST: 23 U/L (ref 0–37)
Albumin: 4.1 g/dL (ref 3.5–5.2)
Alkaline Phosphatase: 43 U/L (ref 39–117)
Bilirubin, Direct: 0 mg/dL (ref 0.0–0.3)
Total Bilirubin: 0.6 mg/dL (ref 0.3–1.2)
Total Protein: 6.9 g/dL (ref 6.0–8.3)

## 2011-09-12 LAB — HEMOGLOBIN A1C: Hgb A1c MFr Bld: 7.7 % — ABNORMAL HIGH (ref 4.6–6.5)

## 2011-09-19 ENCOUNTER — Encounter: Payer: Self-pay | Admitting: Internal Medicine

## 2011-09-19 ENCOUNTER — Ambulatory Visit (INDEPENDENT_AMBULATORY_CARE_PROVIDER_SITE_OTHER): Payer: 59 | Admitting: Internal Medicine

## 2011-09-19 VITALS — BP 144/76 | HR 72 | Temp 98.1°F | Resp 16 | Ht 76.0 in | Wt 240.0 lb

## 2011-09-19 DIAGNOSIS — N139 Obstructive and reflux uropathy, unspecified: Secondary | ICD-10-CM

## 2011-09-19 DIAGNOSIS — E1142 Type 2 diabetes mellitus with diabetic polyneuropathy: Secondary | ICD-10-CM

## 2011-09-19 DIAGNOSIS — E1149 Type 2 diabetes mellitus with other diabetic neurological complication: Secondary | ICD-10-CM

## 2011-09-19 DIAGNOSIS — I10 Essential (primary) hypertension: Secondary | ICD-10-CM

## 2011-09-19 DIAGNOSIS — Z0000 Encounter for general adult medical examination without abnormal findings: Secondary | ICD-10-CM

## 2011-09-19 DIAGNOSIS — E785 Hyperlipidemia, unspecified: Secondary | ICD-10-CM

## 2011-09-19 DIAGNOSIS — N401 Enlarged prostate with lower urinary tract symptoms: Secondary | ICD-10-CM

## 2011-09-19 DIAGNOSIS — N138 Other obstructive and reflux uropathy: Secondary | ICD-10-CM

## 2011-09-19 NOTE — Progress Notes (Signed)
Subjective:    Patient ID: Nathaniel Thomas, male    DOB: 12/28/1950, 61 y.o.   MRN: [id number]  HPI patient is a 61 year old male with insulin dependent diabetes hypertension and a history of benign prostatic hypertrophy.  He states that his CBGs have been within range recently and he has not been as compliant with diet as he should get started an exercise and a more rigorous diet protocol with a goal of getting his sugars down 10-15 points and reducing when necessary insulin. He has symptoms of nocturia and increased frequency consistent with benign prostatic hypertrophy    Review of Systems  Constitutional: Negative for fever and fatigue.  HENT: Negative for hearing loss, congestion, neck pain and postnasal drip.   Eyes: Negative for discharge, redness and visual disturbance.  Respiratory: Negative for cough, shortness of breath and wheezing.   Cardiovascular: Negative for leg swelling.  Gastrointestinal: Negative for abdominal pain, constipation and abdominal distention.  Genitourinary: Negative for urgency and frequency.  Musculoskeletal: Negative for joint swelling and arthralgias.  Skin: Negative for color change and rash.  Neurological: Negative for weakness and light-headedness.  Hematological: Negative for adenopathy.  Psychiatric/Behavioral: Negative for behavioral problems.   Past Medical History  Diagnosis Date  . Diabetes mellitus   . Hypertension   . Hyperlipidemia   . Chronic kidney disease   . Renal calculus or stone     History   Social History  . Marital Status: Married    Spouse Name: N/A    Number of Children: 2  . Years of Education: N/A   Occupational History  .      Retired   Social History Main Topics  . Smoking status: Never Smoker   . Smokeless tobacco: Not on file  . Alcohol Use: No  . Drug Use: No  . Sexually Active: Yes   Other Topics Concern  . Not on file   Social History Narrative  . No narrative on file    Past Surgical History   Procedure Date  . Left knee replacement   . Tonsillectomy   . Lithotripsy   . Laminectomy     Family History  Problem Relation Age of Onset  . Heart disease Brother     Atrial fibrillation  . Heart disease Father     CHF    Allergies  Allergen Reactions  . Demerol Other (See Comments)    Blood pressure dropped completely out  . Meperidine Hcl Other (See Comments)    Drops blood pressure out  . Nitrospan (Nitroglycerin) Other (See Comments)    Blood pressure dropped out    Current Outpatient Prescriptions on File Prior to Visit  Medication Sig Dispense Refill  . aspirin 81 MG tablet Take 81 mg by mouth daily.       . chlorzoxazone (PARAFON) 500 MG tablet TAKE ONE TABLET BY MOUTH 4 TIMES DAILY AS NEEDED FOR MUSCLE SPASMS.  30 tablet  6  . CIALIS 20 MG tablet TAKE 1 TABLET DAILY AS     NEEDED FOR ERECTILE        DYSFUNCTION  6 tablet  3  . etodolac (LODINE) 400 MG tablet Take 400 mg by mouth 2 (two) times daily as needed. For neck pain       . Glucosamine-Chondroitin (MOVE FREE PO) Take 2 tablets by mouth every morning.        [name] HYDROmorphone (DILAUDID) 4 MG tablet Take 4 mg by mouth every 4 (four) hours as needed. For  kidney stone pain       . insulin aspart protamine-insulin aspart (NOVOLOG 70/30) (70-30) 100 UNIT/ML injection Inject 15 Units into the skin 2 (two) times daily.       [name] LEVEMIR FLEXPEN 100 UNIT/ML injection INJECT 55 UNITS INTO THE   SKIN AT BEDTIME  60 mL  0  . lisinopril (PRINIVIL,ZESTRIL) 5 MG tablet Take 10 mg by mouth every morning.       . metFORMIN (GLUCOPHAGE-XR) 500 MG 24 hr tablet Take 1,000 mg by mouth 2 (two) times daily.        . NON FORMULARY Take 1 tablet by mouth daily. Mega Red Krill Oil      . NON FORMULARY Take 1 Package by mouth daily. [facility] nutrition pack once a day      . OVER THE COUNTER MEDICATION Take 2 tablets by mouth at bedtime. URINOZINC prostate health       . Pitavastatin Calcium 2 MG TABS Take 1 tablet by mouth 2  (two) times a week. Mondays and Fridays       . Tamsulosin HCl (FLOMAX) 0.4 MG CAPS Take 0.4 mg by mouth at bedtime.       [name] DISCONTD: insulin aspart protamine-insulin aspart (NOVOLOG MIX 70/30 FLEXPEN) (70-30) 100 UNIT/ML injection Inject 6 Units into the skin 2 (two) times daily. 15units in am and 35 units in am  10 mL  12  . DISCONTD: metFORMIN (GLUCOPHAGE-XR) 500 MG 24 hr tablet TAKE TWO TABLETS BY MOUTH TWICE DAILY  60 tablet  11    BP 144/76  Pulse 72  Temp 98.1 F (36.7 C)  Resp 16  Ht 6\' 4"  (1.93 m)  Wt 240 lb (108.863 kg)  BMI 29.21 kg/m2       Objective:   Physical Exam  Nursing note and vitals reviewed. Constitutional: He is oriented to person, place, and time. He appears well-developed and well-nourished.  HENT:  Head: Normocephalic and atraumatic.  Eyes: Conjunctivae are normal. Pupils are equal, round, and reactive to light.  Neck: Normal range of motion. Neck supple.  Cardiovascular: Normal rate and regular rhythm.   Pulmonary/Chest: Effort normal and breath sounds normal.  Abdominal: Soft. Bowel sounds are normal.  Neurological: He is alert and oriented to person, place, and time.      Diabetic foot exam:  Left: Reflexes 3+   Vibratory sensation diminished  Proprioception normal  Sharp/dull discrimination normal  Filament test present Right: Reflexes 3+   Vibratory sensation diminished  Proprioception normal  Sharp/dull discrimination normal  Filament test present         Assessment & Plan:  Pt was was seen at his eye doctors visionwork and had his eye examination for his diabetes. An A1c will be obtained to monitor his diabetes we reviewed diet and exercise plans with the patient.  Patient's blood pressure stable on current medications Patient is on Flomax for BPH symptomatology.  Patient was scheduled for his yearly physical at next office visit

## 2011-09-19 NOTE — Patient Instructions (Signed)
The patient is instructed to continue all medications as prescribed. Schedule followup with check out clerk upon leaving the clinic  

## 2011-10-10 ENCOUNTER — Other Ambulatory Visit: Payer: Self-pay | Admitting: Internal Medicine

## 2011-10-10 ENCOUNTER — Telehealth: Payer: Self-pay | Admitting: Internal Medicine

## 2011-10-10 NOTE — Telephone Encounter (Signed)
Pt informed called in early this am

## 2011-10-10 NOTE — Telephone Encounter (Signed)
Patient called stating that his refills have run out on his Novolog and he need a new rx called into [facility] patient would like this rushed if possible as he will run out of rx in a week. Please assist.

## 2011-11-04 ENCOUNTER — Telehealth: Payer: Self-pay | Admitting: Internal Medicine

## 2011-11-04 MED ORDER — METFORMIN HCL ER 500 MG PO TB24: 1000.0000 mg | ORAL_TABLET | Freq: Two times a day (BID) | ORAL | Status: DC

## 2011-11-04 NOTE — Telephone Encounter (Signed)
Talked with pt and apologized for mix up- I

## 2011-11-04 NOTE — Telephone Encounter (Signed)
Pt is having an issues with his metformin rx he is taking 4 pills daily and his refill was only for 60 pills, pt having to refill rx 2 times monthly. Pt requesting to have rx filled for 120 pills.please contact

## 2011-12-03 ENCOUNTER — Other Ambulatory Visit: Payer: Self-pay | Admitting: Internal Medicine

## 2011-12-24 ENCOUNTER — Other Ambulatory Visit: Payer: Self-pay | Admitting: Internal Medicine

## 2012-01-04 ENCOUNTER — Other Ambulatory Visit: Payer: Self-pay | Admitting: Internal Medicine

## 2012-01-26 ENCOUNTER — Telehealth: Payer: Self-pay | Admitting: Internal Medicine

## 2012-01-26 MED ORDER — METHYLPREDNISOLONE 4 MG PO KIT: PACK | ORAL | Status: AC

## 2012-01-26 NOTE — Telephone Encounter (Signed)
Per dr Lovell Sheehan- may have medrol 7 days dose pack- pt aware it will elevate bs and he adjust insulin as needed

## 2012-01-26 NOTE — Telephone Encounter (Signed)
Caller: Lillie/Patient; PCP: Darryll Capers; CB#: [phone]; ; ; Call regarding Poison Ivy;  Pt calling requesting RX for Poison Ivy. Onset 6/21. Pt is keeping covered and has not spread past right arm yet. Spots are red and oozing clear liquid. No sxs of infection per pt. Afebrile. Pt had Poison Ivy this past fall so bad he had to have shots and oral steroids. Pt would like something to help stop this before it gets bad. Disp: Trx with Calamine and Calagel with little improvement. See provider w/in 24 hrs per [facility] [place], [place] or [facility]. Pt declines appt. Would rather have something called in. Uses [facility] in [place], [place]. Not sure of #. Pt call back # above.

## 2012-02-16 ENCOUNTER — Other Ambulatory Visit: Payer: Self-pay | Admitting: Internal Medicine

## 2012-02-22 ENCOUNTER — Other Ambulatory Visit: Payer: Self-pay | Admitting: Internal Medicine

## 2012-02-23 ENCOUNTER — Telehealth: Payer: Self-pay | Admitting: Internal Medicine

## 2012-02-23 NOTE — Telephone Encounter (Signed)
Samples given.  

## 2012-02-23 NOTE — Telephone Encounter (Signed)
Pt needs more samples of livalo

## 2012-03-24 ENCOUNTER — Other Ambulatory Visit: Payer: Self-pay | Admitting: Internal Medicine

## 2012-03-25 ENCOUNTER — Other Ambulatory Visit (INDEPENDENT_AMBULATORY_CARE_PROVIDER_SITE_OTHER): Payer: 59

## 2012-03-25 DIAGNOSIS — Z0000 Encounter for general adult medical examination without abnormal findings: Secondary | ICD-10-CM

## 2012-03-25 LAB — HEPATIC FUNCTION PANEL
ALT: 24 U/L (ref 0–53)
AST: 23 U/L (ref 0–37)
Albumin: 4 g/dL (ref 3.5–5.2)
Alkaline Phosphatase: 47 U/L (ref 39–117)
Bilirubin, Direct: 0.1 mg/dL (ref 0.0–0.3)
Total Bilirubin: 0.8 mg/dL (ref 0.3–1.2)
Total Protein: 6.8 g/dL (ref 6.0–8.3)

## 2012-03-25 LAB — BASIC METABOLIC PANEL
BUN: 15 mg/dL (ref 6–23)
CO2: 29 mEq/L (ref 19–32)
Calcium: 9 mg/dL (ref 8.4–10.5)
Chloride: 102 mEq/L (ref 96–112)
Creatinine, Ser: 0.7 mg/dL (ref 0.4–1.5)
GFR: 117.43 mL/min (ref >60.00)
Glucose, Bld: 90 mg/dL (ref 70–99)
Potassium: 4.8 mEq/L (ref 3.5–5.1)
Sodium: 137 mEq/L (ref 135–145)

## 2012-03-25 LAB — CBC WITH DIFFERENTIAL/PLATELET
Basophils Absolute: 0 10*3/uL (ref 0.0–0.1)
Basophils Relative: 0.5 % (ref 0.0–3.0)
Eosinophils Absolute: 0.1 10*3/uL (ref 0.0–0.7)
Eosinophils Relative: 2 % (ref 0.0–5.0)
HCT: 42 % (ref 39.0–52.0)
Hemoglobin: 13.8 g/dL (ref 13.0–17.0)
Lymphocytes Relative: 22.8 % (ref 12.0–46.0)
Lymphs Abs: 1.7 10*3/uL (ref 0.7–4.0)
MCHC: 32.7 g/dL (ref 30.0–36.0)
MCV: 91.8 fl (ref 78.0–100.0)
Monocytes Absolute: 0.6 10*3/uL (ref 0.1–1.0)
Monocytes Relative: 8.6 % (ref 3.0–12.0)
Neutro Abs: 4.8 10*3/uL (ref 1.4–7.7)
Neutrophils Relative %: 66.1 % (ref 43.0–77.0)
Platelets: 204 10*3/uL (ref 150.0–400.0)
RBC: 4.58 Mil/uL (ref 4.22–5.81)
RDW: 14.3 % (ref 11.5–14.6)
WBC: 7.3 10*3/uL (ref 4.5–10.5)

## 2012-03-25 LAB — LIPID PANEL
Cholesterol: 119 mg/dL (ref 0–200)
HDL: 29.7 mg/dL — ABNORMAL LOW (ref >39.00)
LDL Cholesterol: 58 mg/dL (ref 0–99)
Total CHOL/HDL Ratio: 4
Triglycerides: 157 mg/dL — ABNORMAL HIGH (ref 0.0–149.0)
VLDL: 31.4 mg/dL (ref 0.0–40.0)

## 2012-03-25 LAB — HEMOGLOBIN A1C: Hgb A1c MFr Bld: 6.9 % — ABNORMAL HIGH (ref 4.6–6.5)

## 2012-03-25 LAB — POCT URINALYSIS DIPSTICK
Blood, UA: NEGATIVE
Glucose, UA: NEGATIVE
Ketones, UA: NEGATIVE
Leukocytes, UA: NEGATIVE
Nitrite, UA: NEGATIVE
Protein, UA: NEGATIVE
Spec Grav, UA: 1.015
Urobilinogen, UA: 0.2
pH, UA: 7

## 2012-03-25 LAB — MICROALBUMIN / CREATININE URINE RATIO
Creatinine,U: 148.5 mg/dL
Microalb Creat Ratio: 0.5 mg/g (ref 0.0–30.0)
Microalb, Ur: 0.7 mg/dL (ref 0.0–1.9)

## 2012-03-25 LAB — TSH: TSH: 0.87 u[IU]/mL (ref 0.35–5.50)

## 2012-03-25 LAB — PSA: PSA: 2.53 ng/mL (ref 0.10–4.00)

## 2012-04-01 ENCOUNTER — Ambulatory Visit (INDEPENDENT_AMBULATORY_CARE_PROVIDER_SITE_OTHER): Payer: 59 | Admitting: Internal Medicine

## 2012-04-01 ENCOUNTER — Encounter: Payer: Self-pay | Admitting: Internal Medicine

## 2012-04-01 VITALS — BP 130/80 | HR 64 | Temp 98.2°F | Resp 16 | Ht <= 58 in | Wt 236.0 lb

## 2012-04-01 DIAGNOSIS — Z0000 Encounter for general adult medical examination without abnormal findings: Secondary | ICD-10-CM

## 2012-04-01 DIAGNOSIS — I10 Essential (primary) hypertension: Secondary | ICD-10-CM

## 2012-04-01 DIAGNOSIS — E119 Type 2 diabetes mellitus without complications: Secondary | ICD-10-CM

## 2012-04-01 NOTE — Progress Notes (Signed)
Subjective:    Patient ID: Nathaniel Thomas, male    DOB: 09/09/1950, 61 y.o.   MRN: [id number]  HPI Patient is a 61 year old male who is followed for hypertension insulin requiring diabetes and hyperlipidemia who presents for his annual examination.  Patient is doing generally very well his CBGs have reduced significantly and averaging just over 100 his fasting blood glucose showed a CBG of 90 and his A1c has dropped to 4 point   Review of Systems  Constitutional: Negative for fever and fatigue.  HENT: Negative for hearing loss, congestion, neck pain and postnasal drip.   Eyes: Negative for discharge, redness and visual disturbance.  Respiratory: Negative for cough, shortness of breath and wheezing.   Cardiovascular: Negative for leg swelling.  Gastrointestinal: Negative for abdominal pain, constipation and abdominal distention.  Genitourinary: Negative for urgency and frequency.  Musculoskeletal: Negative for joint swelling and arthralgias.  Skin: Negative for color change and rash.  Neurological: Negative for weakness and light-headedness.  Hematological: Negative for adenopathy.  Psychiatric/Behavioral: Negative for behavioral problems.   Past Medical History  Diagnosis Date  . Diabetes mellitus   . Hypertension   . Hyperlipidemia   . Chronic kidney disease   . Renal calculus or stone     History   Social History  . Marital Status: Married    Spouse Name: N/A    Number of Children: 2  . Years of Education: N/A   Occupational History  .      Retired   Social History Main Topics  . Smoking status: Never Smoker   . Smokeless tobacco: Not on file  . Alcohol Use: No  . Drug Use: No  . Sexually Active: Yes   Other Topics Concern  . Not on file   Social History Narrative  . No narrative on file    Past Surgical History  Procedure Date  . Left knee replacement   . Tonsillectomy   . Lithotripsy   . Laminectomy     Family History  Problem Relation Age of  Onset  . Heart disease Brother     Atrial fibrillation  . Heart disease Father     CHF    Allergies  Allergen Reactions  . Demerol Other (See Comments)    Blood pressure dropped completely out  . Meperidine Hcl Other (See Comments)    Drops blood pressure out  . Nitrospan (Nitroglycerin) Other (See Comments)    Blood pressure dropped out    Current Outpatient Prescriptions on File Prior to Visit  Medication Sig Dispense Refill  . chlorzoxazone (PARAFON) 500 MG tablet TAKE ONE TABLET BY MOUTH 4 TIMES DAILY AS NEEDED FOR MUSCLE SPASMS.  30 tablet  6  . CIALIS 20 MG tablet TAKE 1 TABLET DAILY AS     NEEDED FOR ERECTILE        DYSFUNCTION  6 tablet  3  . etodolac (LODINE) 400 MG tablet TAKE ONE TABLET BY MOUTH TWICE DAILY AS NEEDED WITH MEAL  60 tablet  6  . HYDROmorphone (DILAUDID) 4 MG tablet Take 4 mg by mouth every 4 (four) hours as needed. For kidney stone pain       . metFORMIN (GLUCOPHAGE-XR) 500 MG 24 hr tablet Take 2 tablets (1,000 mg total) by mouth 2 (two) times daily.  120 tablet  11  . NON FORMULARY Take 1 tablet by mouth daily. Mega Red Krill Oil      . ONE TOUCH ULTRA TEST test strip USE 3 TIMES  A DAY  300 each  3  . Pitavastatin Calcium 2 MG TABS Take 1 tablet by mouth once a week. Mondays and Fridays      . Tamsulosin HCl (FLOMAX) 0.4 MG CAPS Take 0.4 mg by mouth at bedtime.       [name] DISCONTD: etodolac (LODINE) 400 MG tablet Take 400 mg by mouth 2 (two) times daily as needed. For neck pain       . DISCONTD: LEVEMIR FLEXPEN 100 UNIT/ML injection INJECT 55 UNITS INTO THE   SKIN AT BEDTIME  60 mL  0  . DISCONTD: NOVOLOG MIX 70/30 FLEXPEN (70-30) 100 UNIT/ML injection INJECT 15 UNITS IN THE     MORNING AND 35 UNITS IN THEEVENING  45 mL  3    BP 130/80  Pulse 64  Temp 98.2 F (36.8 C)  Resp 16  Ht 4' (1.219 m)  Wt 236 lb (107.049 kg)  BMI 72.02 kg/m2        Objective:   Physical Exam  Nursing note and vitals reviewed. Constitutional: He is oriented to person,  place, and time. He appears well-developed and well-nourished.  HENT:  Head: Normocephalic and atraumatic.  Eyes: Conjunctivae are normal. Pupils are equal, round, and reactive to light.  Neck: Normal range of motion. Neck supple.  Cardiovascular: Normal rate and regular rhythm.   Pulmonary/Chest: Effort normal and breath sounds normal.  Abdominal: Soft. Bowel sounds are normal.  Genitourinary: Rectum normal and prostate normal.  Musculoskeletal: Normal range of motion.  Neurological: He is alert and oriented to person, place, and time.  Skin: Skin is warm and dry.  Psychiatric: He has a normal mood and affect. His behavior is normal.          Assessment & Plan:   Patient presents for yearly preventative medicine examination.   all immunizations and health maintenance protocols were reviewed with the patient and they are up to date with these protocols.   screening laboratory values were reviewed with the patient including screening of hyperlipidemia PSA renal function and hepatic function.   There medications past medical history social history problem list and allergies were reviewed in detail.   Goals were established with regard to weight loss exercise diet in compliance with medications

## 2012-04-21 ENCOUNTER — Telehealth: Payer: Self-pay | Admitting: Internal Medicine

## 2012-04-21 ENCOUNTER — Other Ambulatory Visit: Payer: Self-pay | Admitting: Internal Medicine

## 2012-04-21 DIAGNOSIS — M79673 Pain in unspecified foot: Secondary | ICD-10-CM

## 2012-04-21 NOTE — Telephone Encounter (Signed)
Per dr Lovell Sheehan- send to dr Eulah Pont and wainer

## 2012-04-21 NOTE — Telephone Encounter (Signed)
Pt called and is req a referral for a surgeon to work on his ankle. Pt said that Dr Lovell Sheehan knows about this matter. Pls call.

## 2012-05-09 ENCOUNTER — Other Ambulatory Visit: Payer: Self-pay | Admitting: Internal Medicine

## 2012-05-10 ENCOUNTER — Other Ambulatory Visit: Payer: Self-pay | Admitting: *Deleted

## 2012-06-22 ENCOUNTER — Ambulatory Visit: Payer: 59 | Admitting: Internal Medicine

## 2012-07-13 ENCOUNTER — Ambulatory Visit (INDEPENDENT_AMBULATORY_CARE_PROVIDER_SITE_OTHER): Payer: 59 | Admitting: Internal Medicine

## 2012-07-13 DIAGNOSIS — Z23 Encounter for immunization: Secondary | ICD-10-CM

## 2012-08-23 ENCOUNTER — Encounter: Payer: Self-pay | Admitting: Internal Medicine

## 2012-08-23 ENCOUNTER — Ambulatory Visit (INDEPENDENT_AMBULATORY_CARE_PROVIDER_SITE_OTHER): Payer: 59 | Admitting: Internal Medicine

## 2012-08-23 VITALS — BP 140/92 | HR 76 | Temp 98.0°F | Resp 16 | Ht 76.0 in | Wt 237.0 lb

## 2012-08-23 DIAGNOSIS — I2510 Atherosclerotic heart disease of native coronary artery without angina pectoris: Secondary | ICD-10-CM

## 2012-08-23 DIAGNOSIS — E109 Type 1 diabetes mellitus without complications: Secondary | ICD-10-CM

## 2012-08-23 DIAGNOSIS — I10 Essential (primary) hypertension: Secondary | ICD-10-CM

## 2012-08-23 LAB — BASIC METABOLIC PANEL
BUN: 15 mg/dL (ref 6–23)
CO2: 29 mEq/L (ref 19–32)
Calcium: 9.5 mg/dL (ref 8.4–10.5)
Chloride: 100 mEq/L (ref 96–112)
Creatinine, Ser: 0.9 mg/dL (ref 0.4–1.5)
GFR: 87.28 mL/min (ref >60.00)
Glucose, Bld: 113 mg/dL — ABNORMAL HIGH (ref 70–99)
Potassium: 4.6 mEq/L (ref 3.5–5.1)
Sodium: 136 mEq/L (ref 135–145)

## 2012-08-23 LAB — HEMOGLOBIN A1C: Hgb A1c MFr Bld: 7.3 % — ABNORMAL HIGH (ref 4.6–6.5)

## 2012-08-23 MED ORDER — TADALAFIL 20 MG PO TABS: 20.0000 mg | ORAL_TABLET | Freq: Every day | ORAL | Status: DC | PRN

## 2012-08-23 NOTE — Progress Notes (Signed)
Subjective:    Patient ID: Nathaniel Thomas, male    DOB: 1951/01/15, 62 y.o.   MRN: [id number]  HPI 9 weeks post ankle surgery Mild instep pain New orthotics are delayed due to medicare Pain control better CBG's better Has followed SSI    Review of Systems  Constitutional: Negative.  Negative for fever and fatigue.  HENT: Negative for hearing loss, congestion, neck pain and postnasal drip.   Eyes: Negative for discharge, redness and visual disturbance.  Respiratory: Negative for cough, shortness of breath and wheezing.   Cardiovascular: Negative for leg swelling.  Gastrointestinal: Negative for abdominal pain, constipation and abdominal distention.  Genitourinary: Negative for urgency and frequency.  Musculoskeletal: Positive for myalgias, joint swelling and gait problem. Negative for arthralgias.  Skin: Negative for color change and rash.  Neurological: Negative for weakness and light-headedness.  Hematological: Negative for adenopathy.  Psychiatric/Behavioral: Negative for behavioral problems.   Past Medical History  Diagnosis Date  . Diabetes mellitus   . Hypertension   . Hyperlipidemia   . Chronic kidney disease   . Renal calculus or stone     History   Social History  . Marital Status: Married    Spouse Name: N/A    Number of Children: 2  . Years of Education: N/A   Occupational History  .      Retired   Social History Main Topics  . Smoking status: Never Smoker   . Smokeless tobacco: Not on file  . Alcohol Use: No  . Drug Use: No  . Sexually Active: Yes   Other Topics Concern  . Not on file   Social History Narrative  . No narrative on file    Past Surgical History  Procedure Date  . Left knee replacement   . Tonsillectomy   . Lithotripsy   . Laminectomy     Family History  Problem Relation Age of Onset  . Heart disease Brother     Atrial fibrillation  . Heart disease Father     CHF    Allergies  Allergen Reactions  . Demerol Other  (See Comments)    Blood pressure dropped completely out  . Meperidine Hcl Other (See Comments)    Drops blood pressure out  . Nitrospan (Nitroglycerin) Other (See Comments)    Blood pressure dropped out    Current Outpatient Prescriptions on File Prior to Visit  Medication Sig Dispense Refill  . chlorzoxazone (PARAFON) 500 MG tablet TAKE ONE TABLET BY MOUTH 4 TIMES DAILY AS NEEDED FOR MUSCLE SPASMS.  30 tablet  6  . etodolac (LODINE) 400 MG tablet TAKE ONE TABLET BY MOUTH TWICE DAILY AS NEEDED WITH MEAL  60 tablet  6  . HYDROmorphone (DILAUDID) 4 MG tablet Take 4 mg by mouth every 4 (four) hours as needed. For kidney stone pain       . insulin aspart protamine-insulin aspart (NOVOLOG MIX 70/30 FLEXPEN) (70-30) 100 UNIT/ML injection       . insulin detemir (LEVEMIR FLEXPEN) 100 UNIT/ML injection       . lisinopril (PRINIVIL,ZESTRIL) 10 MG tablet Take 10 mg by mouth daily.      . metFORMIN (GLUCOPHAGE-XR) 500 MG 24 hr tablet Take 2 tablets (1,000 mg total) by mouth 2 (two) times daily.  120 tablet  11  . NON FORMULARY Take 1 tablet by mouth daily. Mega Red Krill Oil      . NON FORMULARY Take 2 tablets by mouth daily. melaluca vitality --      .  ONE TOUCH ULTRA TEST test strip USE 3 TIMES A DAY  300 each  3  . Pitavastatin Calcium 2 MG TABS Take 1 tablet by mouth once a week. Mondays and Fridays      . tadalafil (CIALIS) 20 MG tablet Take 1 tablet (20 mg total) by mouth daily as needed for Erectile Dysfunction.  10 tablet  3  . Tamsulosin HCl (FLOMAX) 0.4 MG CAPS Take 0.4 mg by mouth at bedtime.         BP 140/92  Pulse 76  Temp 98 F (36.7 C)  Resp 16  Ht 6\' 4"  (1.93 m)  Wt 237 lb (107.502 kg)  BMI 28.85 kg/m2        Objective:   Physical Exam  Constitutional: He appears well-developed and well-nourished.  HENT:  Head: Normocephalic and atraumatic.  Eyes: Conjunctivae normal are normal. Pupils are equal, round, and reactive to light.  Neck: Normal range of motion. Neck  supple.  Cardiovascular: Normal rate and regular rhythm.   Murmur heard. Pulmonary/Chest: Effort normal and breath sounds normal.  Abdominal: Soft. Bowel sounds are normal.  Skin: Skin is warm and dry.          Assessment & Plan:  IDDM  ( ADOM) requiring insulin He has been well-controlled with a basilar insulin and sliding scale insulin with 2 meals. I would not change him to times a day insulin from this regimen to 2 the increased risk for hypoglycemia in the setting of increased risk of heart disease given history hyperlipidemia hypertension and family history

## 2012-08-23 NOTE — Patient Instructions (Signed)
The patient is instructed to continue all medications as prescribed. Schedule followup with check out clerk upon leaving the clinic  

## 2012-10-27 ENCOUNTER — Other Ambulatory Visit: Payer: Self-pay | Admitting: *Deleted

## 2012-10-27 MED ORDER — INSULIN ASPART PROT & ASPART (70-30 MIX) 100 UNIT/ML ~~LOC~~ SUSP: 20.0000 [IU] | Freq: Two times a day (BID) | SUBCUTANEOUS | Status: DC

## 2012-10-27 MED ORDER — METFORMIN HCL ER 500 MG PO TB24: 1000.0000 mg | ORAL_TABLET | Freq: Two times a day (BID) | ORAL | Status: DC

## 2012-10-27 MED ORDER — LISINOPRIL 10 MG PO TABS: 10.0000 mg | ORAL_TABLET | Freq: Every day | ORAL | Status: DC

## 2012-11-26 ENCOUNTER — Ambulatory Visit (INDEPENDENT_AMBULATORY_CARE_PROVIDER_SITE_OTHER): Payer: 59 | Admitting: Internal Medicine

## 2012-11-26 ENCOUNTER — Encounter: Payer: Self-pay | Admitting: Internal Medicine

## 2012-11-26 VITALS — BP 136/80 | HR 76 | Temp 98.0°F | Resp 16 | Ht 76.0 in | Wt 226.0 lb

## 2012-11-26 DIAGNOSIS — E109 Type 1 diabetes mellitus without complications: Secondary | ICD-10-CM

## 2012-11-26 DIAGNOSIS — Z0000 Encounter for general adult medical examination without abnormal findings: Secondary | ICD-10-CM

## 2012-11-26 DIAGNOSIS — E785 Hyperlipidemia, unspecified: Secondary | ICD-10-CM

## 2012-11-26 DIAGNOSIS — I10 Essential (primary) hypertension: Secondary | ICD-10-CM

## 2012-11-26 LAB — BASIC METABOLIC PANEL
BUN: 23 mg/dL (ref 6–23)
CO2: 28 mEq/L (ref 19–32)
Calcium: 9.5 mg/dL (ref 8.4–10.5)
Chloride: 104 mEq/L (ref 96–112)
Creatinine, Ser: 0.9 mg/dL (ref 0.4–1.5)
GFR: 95.45 mL/min (ref >60.00)
Glucose, Bld: 73 mg/dL (ref 70–99)
Potassium: 4.9 mEq/L (ref 3.5–5.1)
Sodium: 138 mEq/L (ref 135–145)

## 2012-11-26 LAB — HEMOGLOBIN A1C: Hgb A1c MFr Bld: 6.4 % (ref 4.6–6.5)

## 2012-11-26 NOTE — Progress Notes (Signed)
Subjective:    Patient ID: Nathaniel Thomas, male    DOB: 09-02-1950, 62 y.o.   MRN: [id number]  HPI  Increase ankle pain after fusion with possible need for immobilizer On aleve for pain Has been on a diet and has lost 21 lbs Has noted resolution of blood pressure elevations with weight reduction  Review of Systems  Constitutional: Negative for fever and fatigue.  HENT: Negative for hearing loss, congestion, neck pain and postnasal drip.   Eyes: Negative for discharge, redness and visual disturbance.  Respiratory: Negative for cough, shortness of breath and wheezing.   Cardiovascular: Negative for leg swelling.  Gastrointestinal: Negative for abdominal pain, constipation and abdominal distention.  Genitourinary: Negative for urgency and frequency.  Musculoskeletal: Positive for myalgias, joint swelling and gait problem. Negative for arthralgias.  Skin: Negative for color change and rash.  Neurological: Positive for weakness and numbness. Negative for light-headedness.  Hematological: Negative for adenopathy.  Psychiatric/Behavioral: Negative for behavioral problems.   Past Medical History  Diagnosis Date  . Diabetes mellitus   . Hypertension   . Hyperlipidemia   . Chronic kidney disease   . Renal calculus or stone     History   Social History  . Marital Status: Married    Spouse Name: N/A    Number of Children: 2  . Years of Education: N/A   Occupational History  .      Retired   Social History Main Topics  . Smoking status: Never Smoker   . Smokeless tobacco: Not on file  . Alcohol Use: No  . Drug Use: No  . Sexually Active: Yes   Other Topics Concern  . Not on file   Social History Narrative  . No narrative on file    Past Surgical History  Procedure Laterality Date  . Left knee replacement    . Tonsillectomy    . Lithotripsy    . Laminectomy      Family History  Problem Relation Age of Onset  . Heart disease Brother     Atrial fibrillation  .  Heart disease Father     CHF    Allergies  Allergen Reactions  . Demerol Other (See Comments)    Blood pressure dropped completely out  . Meperidine Hcl Other (See Comments)    Drops blood pressure out  . Nitrospan (Nitroglycerin) Other (See Comments)    Blood pressure dropped out    Current Outpatient Prescriptions on File Prior to Visit  Medication Sig Dispense Refill  . etodolac (LODINE) 400 MG tablet TAKE ONE TABLET BY MOUTH TWICE DAILY AS NEEDED WITH MEAL  60 tablet  6  . HYDROmorphone (DILAUDID) 4 MG tablet Take 4 mg by mouth every 4 (four) hours as needed. For kidney stone pain       . insulin aspart protamine-insulin aspart (NOVOLOG MIX 70/30 FLEXPEN) (70-30) 100 UNIT/ML injection Inject 20 Units into the skin 2 (two) times daily with a meal.  36 mL  3  . insulin detemir (LEVEMIR FLEXPEN) 100 UNIT/ML injection 30 Units daily.       [name] lisinopril (PRINIVIL,ZESTRIL) 10 MG tablet Take 1 tablet (10 mg total) by mouth daily.  90 tablet  3  . metFORMIN (GLUCOPHAGE-XR) 500 MG 24 hr tablet Take 2 tablets (1,000 mg total) by mouth 2 (two) times daily.  360 tablet  3  . NON FORMULARY Take 1 tablet by mouth daily. Mega Red Krill Oil      . NON FORMULARY Take  2 tablets by mouth daily. melaluca vitality --      . ONE TOUCH ULTRA TEST test strip USE 3 TIMES A DAY  300 each  3  . Pitavastatin Calcium 2 MG TABS Take 1 tablet by mouth once a week. Mondays and Fridays      . tadalafil (CIALIS) 20 MG tablet Take 1 tablet (20 mg total) by mouth daily as needed for erectile dysfunction.  6 tablet  3   No current facility-administered medications on file prior to visit.    BP 136/80  Pulse 76  Temp(Src) 98 F (36.7 C)  Resp 16  Ht 6\' 4"  (1.93 m)  Wt 226 lb (102.513 kg)  BMI 27.52 kg/m2       Objective:   Physical Exam  Nursing note and vitals reviewed. Constitutional: He is oriented to person, place, and time.  Musculoskeletal: Normal range of motion.  Neurological: He is alert and  oriented to person, place, and time.  Skin: Skin is warm and dry.          Assessment & Plan:  Titrate the insulin levemir to 20 Keep the BID 70/30 monitor A1C Continue meal plan to control hypogycemia With snacks between meals

## 2012-11-26 NOTE — Patient Instructions (Addendum)
The patient is instructed to continue all medications as prescribed. Schedule followup with check out clerk upon leaving the clinic  

## 2012-11-30 ENCOUNTER — Encounter: Payer: Self-pay | Admitting: Internal Medicine

## 2012-12-01 ENCOUNTER — Other Ambulatory Visit: Payer: Self-pay | Admitting: *Deleted

## 2012-12-01 MED ORDER — NAPROXEN 500 MG PO TABS: 500.0000 mg | ORAL_TABLET | Freq: Two times a day (BID) | ORAL | Status: DC

## 2012-12-01 NOTE — Telephone Encounter (Signed)
Sent to optum per pt request

## 2012-12-03 ENCOUNTER — Encounter: Payer: Self-pay | Admitting: Internal Medicine

## 2012-12-15 ENCOUNTER — Encounter: Payer: Self-pay | Admitting: *Deleted

## 2012-12-15 ENCOUNTER — Other Ambulatory Visit: Payer: Self-pay | Admitting: *Deleted

## 2013-01-03 ENCOUNTER — Other Ambulatory Visit: Payer: Self-pay | Admitting: *Deleted

## 2013-01-18 ENCOUNTER — Encounter: Payer: Self-pay | Admitting: Internal Medicine

## 2013-01-18 ENCOUNTER — Ambulatory Visit (AMBULATORY_SURGERY_CENTER): Payer: 59

## 2013-01-18 VITALS — Ht 76.0 in | Wt 233.8 lb

## 2013-01-18 DIAGNOSIS — Z1211 Encounter for screening for malignant neoplasm of colon: Secondary | ICD-10-CM

## 2013-01-18 MED ORDER — NA SULFATE-K SULFATE-MG SULF 17.5-3.13-1.6 GM/177ML PO SOLN: 1.0000 | Freq: Once | ORAL | Status: DC

## 2013-02-01 ENCOUNTER — Encounter: Payer: Self-pay | Admitting: Internal Medicine

## 2013-02-01 ENCOUNTER — Ambulatory Visit (AMBULATORY_SURGERY_CENTER): Payer: 59 | Admitting: Internal Medicine

## 2013-02-01 VITALS — BP 149/76 | HR 56 | Temp 96.3°F | Resp 13 | Ht 76.0 in | Wt 233.0 lb

## 2013-02-01 DIAGNOSIS — Z1211 Encounter for screening for malignant neoplasm of colon: Secondary | ICD-10-CM

## 2013-02-01 DIAGNOSIS — K5730 Diverticulosis of large intestine without perforation or abscess without bleeding: Secondary | ICD-10-CM

## 2013-02-01 LAB — GLUCOSE, CAPILLARY
Glucose-Capillary: 177 mg/dL — ABNORMAL HIGH (ref 70–99)
Glucose-Capillary: 165 mg/dL — ABNORMAL HIGH (ref 70–99)

## 2013-02-01 MED ORDER — SODIUM CHLORIDE 0.9 % IV SOLN: 500.0000 mL | INTRAVENOUS | Status: DC

## 2013-02-01 NOTE — Op Note (Signed)
[facility] [address] [place], [address]   COLONOSCOPY PROCEDURE REPORT  PATIENT: Nathaniel Thomas, [place]  MR#: [id number] BIRTHDATE: 1950-11-05 , 62  yrs. old GENDER: Male ENDOSCOPIST: Iva Boop, MD, [facility] REFERRED XB:JYNW Carolynn Sayers, M.D. PROCEDURE DATE:  02/01/2013 PROCEDURE:   Colonoscopy, screening ASA CLASS:   Class II INDICATIONS:average risk screening and first colonoscopy. MEDICATIONS: propofol (Diprivan) 200mg  IV, MAC sedation, administered by CRNA, and These medications were titrated to patient response per physician's verbal order  DESCRIPTION OF PROCEDURE:   After the risks benefits and alternatives of the procedure were thoroughly explained, informed consent was obtained.  A digital rectal exam revealed a mildly enlarged prostate, smooth.   The LB [id number]  endoscope was introduced through the anus and advanced to the cecum, which was identified by both the appendix and ileocecal valve. No adverse events experienced.   The quality of the prep was excellent using Suprep  The instrument was then slowly withdrawn as the colon was fully examined.      COLON FINDINGS: Moderate diverticulosis was noted in the sigmoid colon.   The colon mucosa was otherwise normal.   A right colon retroflexion was performed.  Retroflexed views revealed no abnormalities. The time to cecum=1 minutes 26 seconds.  Withdrawal time=8 minutes 18 seconds.  The scope was withdrawn and the procedure completed. COMPLICATIONS: There were no complications.  ENDOSCOPIC IMPRESSION: 1.   Moderate diverticulosis was noted in the sigmoid colon 2.   The colon mucosa was otherwise normal - excellent prep - first screening exam  RECOMMENDATIONS: 1.  Repeat routine colonscopy in 10 years - 2024 2.   Keep regular folow-up of prostate with PCP (mildly enlarged but smooth on my exam today)   eSigned:  Iva Boop, MD, [facility] 02/01/2013 8:57 AM   cc: Stacie Glaze, MD  and The Patient

## 2013-02-01 NOTE — Progress Notes (Signed)
Patient did not experience any of the following events: a burn prior to discharge; a fall within the facility; wrong site/side/patient/procedure/implant event; or a hospital transfer or hospital admission upon discharge from the facility. (G8907)Patient did not have preoperative order for IV antibiotic SSI prophylaxis. (G8918) ewm 

## 2013-02-01 NOTE — Progress Notes (Signed)
Lidocaine-40mg IV prior to Propofol InductionPropofol given over incremental dosages 

## 2013-02-01 NOTE — Patient Instructions (Addendum)
No polyps or cancer seen and a fine prep! You do have diverticulosis - common and not usually a problem. I also thought the prostate was mildly enlarged but no suspicious findings. Please keep regular follow-up of that with Dr. Lovell Sheehan (primary care). Next routine colonoscopy in 10 years 2024.  I appreciate the opportunity to care for you. Iva Boop, MD, FACG   YOU HAD AN ENDOSCOPIC PROCEDURE TODAY AT THE [facility]: Refer to the procedure report that was given to you for any specific questions about what was found during the examination.  If the procedure report does not answer your questions, please call your gastroenterologist to clarify.  If you requested that your care partner not be given the details of your procedure findings, then the procedure report has been included in a sealed envelope for you to review at your convenience later.  YOU SHOULD EXPECT: Some feelings of bloating in the abdomen. Passage of more gas than usual.  Walking can help get rid of the air that was put into your GI tract during the procedure and reduce the bloating. If you had a lower endoscopy (such as a colonoscopy or flexible sigmoidoscopy) you may notice spotting of blood in your stool or on the toilet paper. If you underwent a bowel prep for your procedure, then you may not have a normal bowel movement for a few days.  DIET: Your first meal following the procedure should be a light meal and then it is ok to progress to your normal diet.  A half-sandwich or bowl of soup is an example of a good first meal.  Heavy or fried foods are harder to digest and may make you feel nauseous or bloated.  Likewise meals heavy in dairy and vegetables can cause extra gas to form and this can also increase the bloating.  Drink plenty of fluids but you should avoid alcoholic beverages for 24 hours.  ACTIVITY: Your care partner should take you home directly after the procedure.  You should plan to take it easy,  moving slowly for the rest of the day.  You can resume normal activity the day after the procedure however you should NOT DRIVE or use heavy machinery for 24 hours (because of the sedation medicines used during the test).    SYMPTOMS TO REPORT IMMEDIATELY: A gastroenterologist can be reached at any hour.  During normal business hours, 8:30 AM to 5:00 PM Monday through Friday, call [phone].  After hours and on weekends, please call the GI answering service at [phone] who will take a message and have the physician on call contact you.   Following lower endoscopy (colonoscopy or flexible sigmoidoscopy):  Excessive amounts of blood in the stool  Significant tenderness or worsening of abdominal pains  Swelling of the abdomen that is new, acute  Fever of 100F or higher  FOLLOW UP: If any biopsies were taken you will be contacted by phone or by letter within the next 1-3 weeks.  Call your gastroenterologist if you have not heard about the biopsies in 3 weeks.  Our staff will call the home number listed on your records the next business day following your procedure to check on you and address any questions or concerns that you may have at that time regarding the information given to you following your procedure. This is a courtesy call and so if there is no answer at the home number and we have not heard from you through the emergency physician  on call, we will assume that you have returned to your regular daily activities without incident.  SIGNATURES/CONFIDENTIALITY: You and/or your care partner have signed paperwork which will be entered into your electronic medical record.  These signatures attest to the fact that that the information above on your After Visit Summary has been reviewed and is understood.  Full responsibility of the confidentiality of this discharge information lies with you and/or your care-partner.  Handout on diverticulosis  Repeat colon in 10 years-2024

## 2013-02-02 ENCOUNTER — Telehealth: Payer: Self-pay | Admitting: *Deleted

## 2013-02-02 NOTE — Telephone Encounter (Signed)
  Follow up Call-  Call back number 02/01/2013  Post procedure Call Back phone  # [phone]  Permission to leave phone message Yes     Patient questions:  Do you have a fever, pain , or abdominal swelling? no Pain Score  0 *  Have you tolerated food without any problems? yes  Have you been able to return to your normal activities? yes  Do you have any questions about your discharge instructions: Diet   no Medications  no Follow up visit  no  Do you have questions or concerns about your Care? no  Actions: * If pain score is 4 or above: No action needed, pain <4.

## 2013-02-23 ENCOUNTER — Other Ambulatory Visit: Payer: Self-pay | Admitting: *Deleted

## 2013-02-23 ENCOUNTER — Encounter: Payer: Self-pay | Admitting: Internal Medicine

## 2013-02-23 MED ORDER — INSULIN DETEMIR 100 UNIT/ML ~~LOC~~ SOLN: 30.0000 [IU] | Freq: Every day | SUBCUTANEOUS | Status: DC

## 2013-02-23 NOTE — Telephone Encounter (Signed)
Med has been sent in  

## 2013-03-02 ENCOUNTER — Encounter: Payer: Self-pay | Admitting: Internal Medicine

## 2013-03-02 ENCOUNTER — Ambulatory Visit (INDEPENDENT_AMBULATORY_CARE_PROVIDER_SITE_OTHER): Payer: 59 | Admitting: Internal Medicine

## 2013-03-02 VITALS — BP 124/80 | HR 72 | Temp 98.6°F | Resp 16 | Ht 76.0 in | Wt 220.0 lb

## 2013-03-02 DIAGNOSIS — S92901K Unspecified fracture of right foot, subsequent encounter for fracture with nonunion: Secondary | ICD-10-CM

## 2013-03-02 DIAGNOSIS — IMO0002 Reserved for concepts with insufficient information to code with codable children: Secondary | ICD-10-CM

## 2013-03-02 DIAGNOSIS — IMO0001 Reserved for inherently not codable concepts without codable children: Secondary | ICD-10-CM

## 2013-03-02 LAB — CALCIUM: Calcium: 9.6 mg/dL (ref 8.4–10.5)

## 2013-03-02 LAB — HEMOGLOBIN A1C: Hgb A1c MFr Bld: 6.8 % — ABNORMAL HIGH (ref 4.6–6.5)

## 2013-03-02 NOTE — Patient Instructions (Signed)
Start Os-Cal with vitamin D twice daily for 30 days

## 2013-03-02 NOTE — Progress Notes (Signed)
Subjective:    Patient ID: Nathaniel Thomas, male    DOB: 1950/09/07, 62 y.o.   MRN: [id number]  HPI Patient had an ankle fracture with slow healing.  Patient has not had a vitamin D level and is at some risk for osteopenia.  I recommended that we get a vitamin D level today and that he empirically go ahead and start calcium with vitamin D daily while he is wearing the cast  CBGs stable Occasional elevations   Review of Systems  Constitutional: Negative for fever and fatigue.  HENT: Negative for hearing loss, congestion, neck pain and postnasal drip.   Eyes: Negative for discharge, redness and visual disturbance.  Respiratory: Negative for cough, shortness of breath and wheezing.   Cardiovascular: Negative for leg swelling.  Gastrointestinal: Negative for abdominal pain, constipation and abdominal distention.  Genitourinary: Negative for urgency and frequency.  Musculoskeletal: Negative for joint swelling and arthralgias.  Skin: Negative for color change and rash.  Neurological: Negative for weakness and light-headedness.  Hematological: Negative for adenopathy.  Psychiatric/Behavioral: Negative for behavioral problems.   Past Medical History  Diagnosis Date  . Diabetes mellitus   . Hypertension   . Hyperlipidemia   . Chronic kidney disease   . Renal calculus or stone   . Glaucoma   . Blockage of coronary artery of heart     35 %  . Skin cancer of nose     ears and hands    History   Social History  . Marital Status: Married    Spouse Name: N/A    Number of Children: 2  . Years of Education: N/A   Occupational History  .      Retired   Social History Main Topics  . Smoking status: Never Smoker   . Smokeless tobacco: Never Used  . Alcohol Use: No  . Drug Use: No  . Sexually Active: Yes   Other Topics Concern  . Not on file   Social History Narrative  . No narrative on file    Past Surgical History  Procedure Laterality Date  . Left knee replacement   2002/ 2004  . Tonsillectomy    . Lithotripsy      was cancelled/pt passed stone  . Laminectomy  1983/1988  . Basal cell carcinoma excision      hands  . Ankle surgery      2013/ wears boot cast    Family History  Problem Relation Age of Onset  . Heart disease Brother     Atrial fibrillation  . Heart disease Father     CHF  . Heart disease Mother     Allergies  Allergen Reactions  . Demerol Other (See Comments)    Blood pressure dropped completely out  . Meperidine Hcl Other (See Comments)    Drops blood pressure out  . Nitrospan (Nitroglycerin) Other (See Comments)    Blood pressure dropped out    Current Outpatient Prescriptions on File Prior to Visit  Medication Sig Dispense Refill  . bimatoprost (LUMIGAN) 0.03 % ophthalmic solution Place 1 drop into both eyes at bedtime.      [name] etodolac (LODINE) 400 MG tablet Take 400 mg by mouth daily.      [name] HYDROmorphone (DILAUDID) 4 MG tablet Take 4 mg by mouth every 4 (four) hours as needed. For kidney stone pain       . insulin aspart protamine-insulin aspart (NOVOLOG MIX 70/30 FLEXPEN) (70-30) 100 UNIT/ML injection Inject 20 Units into the  skin 2 (two) times daily with a meal.  36 mL  3  . insulin detemir (LEVEMIR) 100 UNIT/ML injection Inject 0.3 mLs (30 Units total) into the skin daily.  30 mL  3  . lisinopril (PRINIVIL,ZESTRIL) 10 MG tablet Take 1 tablet (10 mg total) by mouth daily.  90 tablet  3  . metFORMIN (GLUCOPHAGE-XR) 500 MG 24 hr tablet Take 2 tablets (1,000 mg total) by mouth 2 (two) times daily.  360 tablet  3  . naproxen (NAPROSYN) 500 MG tablet Take 1 tablet (500 mg total) by mouth 2 (two) times daily with a meal.  180 tablet  1  . Naproxen Sodium (ALEVE PO) Take by mouth. Take 2 bid      . NON FORMULARY Take 2 tablets by mouth daily. melaluca vitality --      . NON FORMULARY Alpha-lopic acid 200 mg-Take one daily      . Nutritional Supplements (PROSTATE ENZYME FORMULA PO) Take by mouth. Take 2 in pm      . ONE  TOUCH ULTRA TEST test strip USE 3 TIMES A DAY  300 each  3  . Pitavastatin Calcium 2 MG TABS Take 4 mg by mouth once a week. Mondays      . tadalafil (CIALIS) 20 MG tablet Take 1 tablet (20 mg total) by mouth daily as needed for erectile dysfunction.  6 tablet  3   No current facility-administered medications on file prior to visit.    BP 124/80  Pulse 72  Temp(Src) 98.6 F (37 C)  Resp 16  Ht 6\' 4"  (1.93 m)  Wt 220 lb (99.791 kg)  BMI 26.79 kg/m2       Objective:   Physical Exam  Nursing note and vitals reviewed. Constitutional: He appears well-developed and well-nourished.  HENT:  Head: Normocephalic and atraumatic.  Eyes: Conjunctivae are normal. Pupils are equal, round, and reactive to light.  Neck: Normal range of motion. Neck supple.  Cardiovascular: Normal rate and regular rhythm.   Pulmonary/Chest: Effort normal and breath sounds normal.  Abdominal: Soft. Bowel sounds are normal.          Assessment & Plan:  Possible osteopenia measure vitamin D. Level today and a calcium level began Os-Cal twice daily Average CBG's 109 Weight loss GERD ED samples

## 2013-03-03 LAB — VITAMIN D 25 HYDROXY (VIT D DEFICIENCY, FRACTURES): Vit D, 25-Hydroxy: 58 ng/mL (ref 30–89)

## 2013-04-20 ENCOUNTER — Other Ambulatory Visit: Payer: Self-pay | Admitting: Internal Medicine

## 2013-04-27 ENCOUNTER — Other Ambulatory Visit: Payer: Self-pay | Admitting: Internal Medicine

## 2013-05-20 ENCOUNTER — Other Ambulatory Visit: Payer: Self-pay | Admitting: Orthopedic Surgery

## 2013-05-20 DIAGNOSIS — M25571 Pain in right ankle and joints of right foot: Secondary | ICD-10-CM

## 2013-05-21 ENCOUNTER — Encounter: Payer: Self-pay | Admitting: Internal Medicine

## 2013-05-25 ENCOUNTER — Ambulatory Visit (INDEPENDENT_AMBULATORY_CARE_PROVIDER_SITE_OTHER): Payer: 59 | Admitting: Internal Medicine

## 2013-05-25 ENCOUNTER — Other Ambulatory Visit: Payer: 59

## 2013-05-25 DIAGNOSIS — Z2911 Encounter for prophylactic immunotherapy for respiratory syncytial virus (RSV): Secondary | ICD-10-CM

## 2013-05-25 DIAGNOSIS — Z23 Encounter for immunization: Secondary | ICD-10-CM

## 2013-06-01 ENCOUNTER — Ambulatory Visit
Admission: RE | Admit: 2013-06-01 | Discharge: 2013-06-01 | Disposition: A | Discharge status: Home / Self Care | Payer: 59 | Source: Ambulatory Visit | Attending: Orthopedic Surgery | Admitting: Orthopedic Surgery

## 2013-06-01 DIAGNOSIS — M25571 Pain in right ankle and joints of right foot: Secondary | ICD-10-CM

## 2013-06-01 IMAGING — CT CT ANKLE*R* W/O CM
2 of 4 series · 4 of 14 positions shown, 5 images · non-contrast
Comparison: None.

CLINICAL DATA: Increasing medial right ankle pain for 4 months.
Previous ankle joint fusion.

EXAM:
CT OF THE RIGHT ANKLE WITHOUT CONTRAST
TECHNIQUE: Multidetector CT imaging was performed according to the standard
protocol. Multiplanar CT image reconstructions were also generated.

[Series 2: ankle/foot bone · axial · 0.33mm/px · z∈[-27,+30]mm · 2 of 70 slices shown, 3 images]
[im 24/70  soft-tissue]
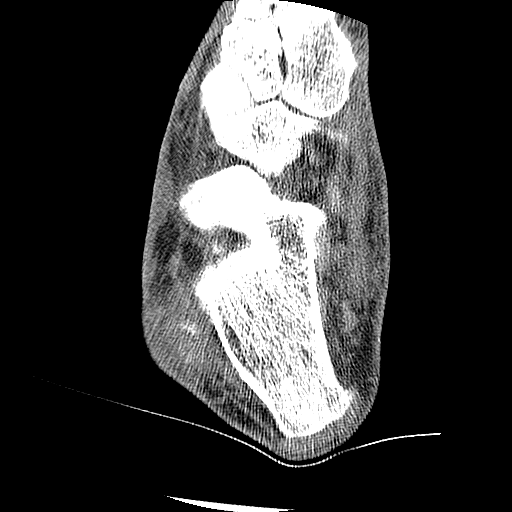
[im 24/70  bone]
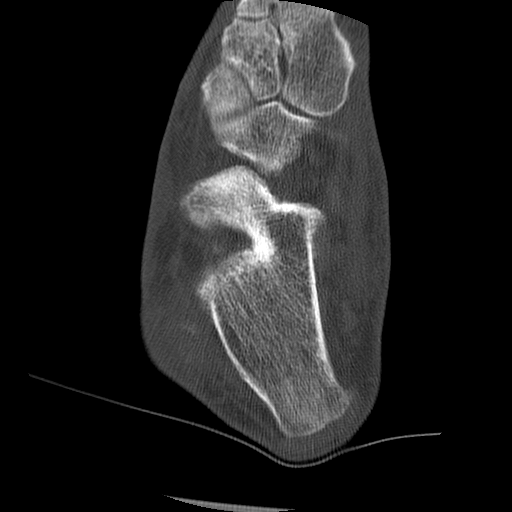
[im 47/70  bone]
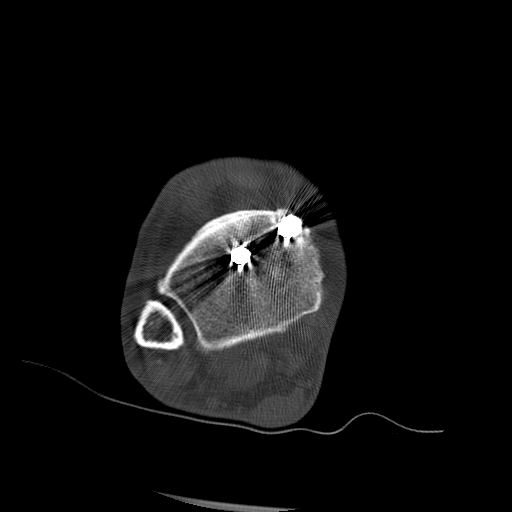

[Series 3: ankle /foot detail · axial · 0.33mm/px · z∈[-27,+30]mm · 2 of 70 slices shown]
[im 24/70  bone]
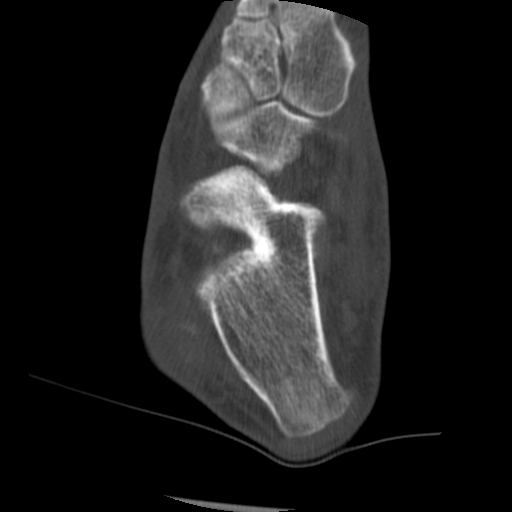
[im 47/70  bone]
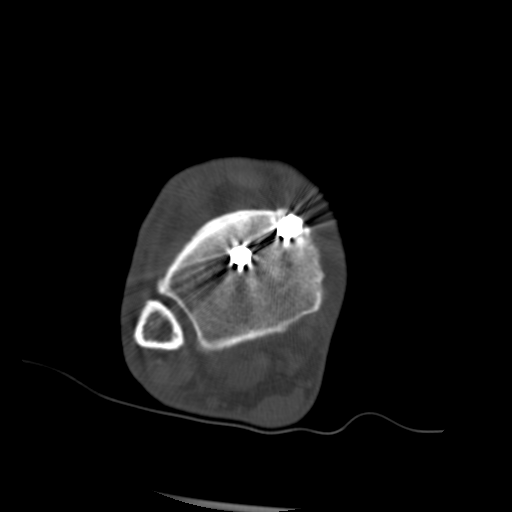

[4 of 14 positions shown; findings below may reference images not displayed]

FINDINGS: There are 2 pins extending from the distal tibia across the ankle
joint into the talus. Pins appear in good position without evidence
of loosening.

However, there is no solid osseous fusion at the tibiotalar joint.
There are several calcified loose bodies in the ankle joint and
there are multiple erosions of the distal tibia and of the dome of
the talus. There are severe arthritic changes between the medial and
lateral malleoli in the talus.

There is moderate arthritis of the subtalar joint. The navicular and
cuneiforms and cuboid and calcaneus otherwise appear normal. There
are multiple calcifications adjacent to the peroneal tendons which
may represent calcific tenosynovitis.
IMPRESSION: 1. No osseous union of the ankle joint. No visible loosening of the
pins.
2. Severe arthritic changes of the ankle joint and moderate
arthritis of the subtalar joint.
3. Probable calcific tenosynovitis of the peroneal tendons.

## 2013-06-02 ENCOUNTER — Other Ambulatory Visit: Payer: Self-pay | Admitting: Internal Medicine

## 2013-06-14 ENCOUNTER — Telehealth: Payer: Self-pay | Admitting: Cardiology

## 2013-06-14 NOTE — Telephone Encounter (Signed)
Ok  Emre Stock  

## 2013-06-14 NOTE — Telephone Encounter (Signed)
Spoke with patient who c/o tightness in chest x 2-3 days or maybe for as long as a week.  Patient states the pain is located in the center of his chest.  Patient denies nausea, SOB, diaphoresis.  Patient states he has not been checking his BP regularly recently but that he went to the eye doctor today and they told him his BP was 150/96.  Patient rechecked when he got back home and BP was 147/90. Patient states at this time he took an extra Lisinopril 10 mg.  Patient states that later today he rode the golf cart and when he came in he noticed that he still felt some chest tightness.  Patient states at this time his BP was 137/86.  Patient reports starting a new diet today; states he has eaten more leafy greens than normal.  I advised patient that Dr. Jens Som is not in the office today but is in [employer] and [place] offices tomorrow.  Patient is agreeable to go to [facility] tomorrow.  Appointment was scheduled by Sammuel Hines in scheduling for 11/12 @ 2:30.  I advised patient to go to the ER if he experiences worsening chest tightness and reviewed other concerning symptoms with him.  Patient verbalized understanding and agreement with plan.

## 2013-06-14 NOTE — Telephone Encounter (Signed)
New Problem  Pt called concerned with chest discomfort and High Bp.. Made appt for PA Norma Fredrickson on 11/14 @ 9:30am.

## 2013-06-15 ENCOUNTER — Other Ambulatory Visit: Payer: Self-pay | Admitting: Cardiology

## 2013-06-15 ENCOUNTER — Ambulatory Visit (INDEPENDENT_AMBULATORY_CARE_PROVIDER_SITE_OTHER): Payer: 59 | Admitting: Cardiology

## 2013-06-15 ENCOUNTER — Other Ambulatory Visit: Payer: Self-pay | Admitting: Internal Medicine

## 2013-06-15 ENCOUNTER — Encounter: Payer: Self-pay | Admitting: Cardiology

## 2013-06-15 VITALS — BP 140/80 | HR 75 | Ht 76.0 in | Wt 235.0 lb

## 2013-06-15 DIAGNOSIS — R079 Chest pain, unspecified: Secondary | ICD-10-CM | POA: Insufficient documentation

## 2013-06-15 LAB — TROPONIN I: Troponin I: <0.01 ng/mL (ref <0.06)

## 2013-06-15 LAB — CK TOTAL AND CKMB (NOT AT ARMC)
CK, MB: 2.6 ng/mL (ref 0.0–5.0)
Relative Index: 1.8 (ref 0.0–4.0)
Total CK: 146 U/L (ref 7–232)

## 2013-06-15 MED ORDER — ASPIRIN EC 325 MG PO TBEC: 325.0000 mg | DELAYED_RELEASE_TABLET | Freq: Every day | ORAL | Status: DC

## 2013-06-15 MED ORDER — LISINOPRIL 20 MG PO TABS: 20.0000 mg | ORAL_TABLET | Freq: Every day | ORAL | Status: DC

## 2013-06-15 NOTE — Assessment & Plan Note (Signed)
Blood pressure has been mildly elevated at home. Increase lisinopril to 20 mg daily. Check potassium and renal function in one week.

## 2013-06-15 NOTE — Progress Notes (Signed)
HPI: FU CAD. Nuclear study in October of 2011 showed an ejection fraction of 53% and normal perfusion. Cardiac CT in July of 2012 revealed mild to moderate coronary artery disease. The patient's total coronary artery calcium score is 28.9, which is 42nd percentile for patient's matched age and gender. The calcium is all part of a mixed plaque in the proximal LAD which causes less than 50% stenosis. Patient last seen in August 2012. The past 4-5 dose the patient has noticed chest tightness. Will substernal without radiation. No associated symptoms. It has been essentially continuous. It is not pleuritic or positional. There is increase with food. His symptoms improved last evening but returned today after eating an apple. There's been some associated water brash. He denies exertional chest pain. Mild dyspnea on exertion but no orthopnea, PND or pedal edema.   Current Outpatient Prescriptions  Medication Sig Dispense Refill  . bimatoprost (LUMIGAN) 0.03 % ophthalmic solution Place 1 drop into both eyes at bedtime.      [name] HYDROmorphone (DILAUDID) 4 MG tablet Take 4 mg by mouth every 4 (four) hours as needed. For kidney stone pain       . insulin detemir (LEVEMIR) 100 UNIT/ML injection Inject 0.3 mLs (30 Units total) into the skin daily.  30 mL  3  . LEVEMIR FLEXPEN 100 UNIT/ML SOPN Inject subcutaneously 30  units daily  10 pen  3  . lisinopril (PRINIVIL,ZESTRIL) 10 MG tablet Take 1 tablet (10 mg total) by mouth daily.  90 tablet  3  . metFORMIN (GLUCOPHAGE-XR) 500 MG 24 hr tablet Take 2 tablets (1,000 mg total) by mouth 2 (two) times daily.  360 tablet  3  . NON FORMULARY Take 2 tablets by mouth daily. melaluca vitality --      . NON FORMULARY Alpha-lopic acid 200 mg-Take one daily      . NOVOLOG MIX 70/30 FLEXPEN (70-30) 100 UNIT/ML SUPN Inject subcutaneously 20  units two times daily with  a meal  12 mL  11  . Nutritional Supplements (PROSTATE ENZYME FORMULA PO) Take by mouth. Take 2 in pm       . ONE TOUCH ULTRA TEST test strip Use 3 times a day  300 each  3  . Pitavastatin Calcium 2 MG TABS Take 4 mg by mouth once a week. Mondays      . tadalafil (CIALIS) 20 MG tablet Take 1 tablet (20 mg total) by mouth daily as needed for erectile dysfunction.  6 tablet  3   No current facility-administered medications for this visit.     Past Medical History  Diagnosis Date  . Diabetes mellitus   . Hypertension   . Hyperlipidemia   . Chronic kidney disease   . Renal calculus or stone   . Glaucoma   . CAD (coronary artery disease)   . Skin cancer of nose     ears and hands    Past Surgical History  Procedure Laterality Date  . Left knee replacement  2002/ 2004  . Tonsillectomy    . Lithotripsy      was cancelled/pt passed stone  . Laminectomy  1983/1988  . Basal cell carcinoma excision      hands  . Ankle surgery      2013/ wears boot cast    History   Social History  . Marital Status: Married    Spouse Name: N/A    Number of Children: 2  . Years of Education: N/A  Occupational History  .      Retired   Social History Main Topics  . Smoking status: Never Smoker   . Smokeless tobacco: Never Used  . Alcohol Use: No  . Drug Use: No  . Sexual Activity: Yes   Other Topics Concern  . Not on file   Social History Narrative  . No narrative on file    ROS: no fevers or chills, productive cough, hemoptysis, dysphasia, odynophagia, melena, hematochezia, dysuria, hematuria, rash, seizure activity, orthopnea, PND, pedal edema, claudication. Remaining systems are negative.  Physical Exam: Well-developed well-nourished in no acute distress.  Skin is warm and dry.  HEENT is normal.  Neck is supple.  Chest is clear to auscultation with normal expansion.  Cardiovascular exam is regular rate and rhythm.  Abdominal exam nontender or distended. No masses palpated. Extremities show no edema. neuro grossly intact  ECG sinus rhythm with occasional PVC. Left ventricular  hypertrophy.

## 2013-06-15 NOTE — Assessment & Plan Note (Signed)
Continue statin. 

## 2013-06-15 NOTE — Telephone Encounter (Signed)
Renae Fickle called me from [place] and stated for some reason the CKMB I ordered did not cross over and would it be ok to order another one I stated yes and also stated Dr. Jens Som gave his cell phone number and wanted to contacted asap since labs are stat, Renae Fickle stated all he can do is foot note that message

## 2013-06-15 NOTE — Assessment & Plan Note (Signed)
Patient feels his symptoms are most likely GI related. There was increase after eating an apple and some associated water brash. His last cardiogram shows no ST changes. I will check a CK-MB and troponin today. If abnormal he will need to be admitted. If normal we will plan an outpatient nuclear study.

## 2013-06-15 NOTE — Assessment & Plan Note (Signed)
Continue statin. Resume aspirin 325 mg daily.

## 2013-06-15 NOTE — Patient Instructions (Signed)
Your physician has recommended you make the following change in your medication:  INCREASE LISINOPRIL ( 20 MG ) DAILY START ASPRIN ( 325 MG ) DAILY  Your physician recommends that you havelab work today STAT Troponin and CKMB Your physician recommends that you return for lab work in: one week bmet  Your physician has requested that you have a lexiscan myoview. For further information please visit [email or web address] Please follow instruction sheet, as given. Test is scheduled for the [address] office @ 8:45 am on December 4th  Your physician recommends that you keep your follow-up appointment in: 6 weeks with Dr. Jens Som on December 17 @ 2:45

## 2013-06-17 ENCOUNTER — Encounter: Payer: Self-pay | Admitting: Internal Medicine

## 2013-06-17 ENCOUNTER — Ambulatory Visit: Payer: 59 | Admitting: Nurse Practitioner

## 2013-06-17 MED ORDER — INSULIN DETEMIR 100 UNIT/ML FLEXPEN: 20.0000 [IU] | PEN_INJECTOR | Freq: Two times a day (BID) | SUBCUTANEOUS | Status: DC

## 2013-06-20 ENCOUNTER — Ambulatory Visit (INDEPENDENT_AMBULATORY_CARE_PROVIDER_SITE_OTHER): Payer: 59 | Admitting: Internal Medicine

## 2013-06-20 ENCOUNTER — Encounter: Payer: Self-pay | Admitting: Internal Medicine

## 2013-06-20 VITALS — BP 130/68 | HR 72 | Temp 98.2°F | Resp 16 | Ht 76.0 in | Wt 236.0 lb

## 2013-06-20 DIAGNOSIS — R0789 Other chest pain: Secondary | ICD-10-CM

## 2013-06-20 DIAGNOSIS — IMO0001 Reserved for inherently not codable concepts without codable children: Secondary | ICD-10-CM

## 2013-06-20 DIAGNOSIS — I10 Essential (primary) hypertension: Secondary | ICD-10-CM

## 2013-06-20 LAB — COMPREHENSIVE METABOLIC PANEL
ALT: 23 U/L (ref 0–53)
AST: 28 U/L (ref 0–37)
Albumin: 4.4 g/dL (ref 3.5–5.2)
Alkaline Phosphatase: 49 U/L (ref 39–117)
BUN: 20 mg/dL (ref 6–23)
CO2: 26 mEq/L (ref 19–32)
Calcium: 9.4 mg/dL (ref 8.4–10.5)
Chloride: 104 mEq/L (ref 96–112)
Creatinine, Ser: 0.9 mg/dL (ref 0.4–1.5)
GFR: 88.14 mL/min (ref >60.00)
Glucose, Bld: 83 mg/dL (ref 70–99)
Potassium: 4.4 mEq/L (ref 3.5–5.1)
Sodium: 136 mEq/L (ref 135–145)
Total Bilirubin: 0.6 mg/dL (ref 0.3–1.2)
Total Protein: 7.4 g/dL (ref 6.0–8.3)

## 2013-06-20 LAB — HEMOGLOBIN A1C: Hgb A1c MFr Bld: 7.1 % — ABNORMAL HIGH (ref 4.6–6.5)

## 2013-06-20 NOTE — Patient Instructions (Signed)
levemir to 20-25 BID And try ss novolog  Less that 150 5 u AC 150-200  8 u 200-250 10 u 250 up 12 units

## 2013-06-20 NOTE — Progress Notes (Signed)
Pre-visit discussion using our clinic review tool. No additional management support is needed unless otherwise documented below in the visit note.  

## 2013-06-20 NOTE — Progress Notes (Signed)
  Subjective:    Patient ID: Nathaniel Thomas, male    DOB: May 18, 1951, 62 y.o.   MRN: [id number]  Diabetes Pertinent negatives for diabetes include no fatigue and no weakness.  Hypertension Pertinent negatives include no neck pain or shortness of breath.  Hyperlipidemia Pertinent negatives include no shortness of breath.    Levemir change noted with 20 u BID Titrate that to keep blood sugars down and add pre meal novolog regular Toe exam   Review of Systems  Constitutional: Negative for fever and fatigue.  HENT: Negative for congestion, hearing loss and postnasal drip.   Eyes: Negative for discharge, redness and visual disturbance.  Respiratory: Negative for cough, shortness of breath and wheezing.   Cardiovascular: Negative for leg swelling.  Gastrointestinal: Negative for abdominal pain, constipation and abdominal distention.  Genitourinary: Negative for urgency and frequency.  Musculoskeletal: Negative for arthralgias, joint swelling and neck pain.  Skin: Negative for color change and rash.  Neurological: Negative for weakness and light-headedness.  Hematological: Negative for adenopathy.  Psychiatric/Behavioral: Negative for behavioral problems.       Objective:   Physical Exam  Nursing note and vitals reviewed. Constitutional: He is oriented to person, place, and time. He appears well-developed and well-nourished.  HENT:  Head: Normocephalic and atraumatic.  Eyes: Conjunctivae are normal. Pupils are equal, round, and reactive to light.  Neck: Normal range of motion. Neck supple.  Cardiovascular: Normal rate and regular rhythm.   Pulmonary/Chest: Effort normal and breath sounds normal.  Abdominal: Soft. Bowel sounds are normal.  Neurological: He is oriented to person, place, and time.  Psychiatric: He has a normal mood and affect. His behavior is normal.          Assessment & Plan:  Change to levemir BID and to novolog for cost containment due to formulary charge  for 70/30  Ankle pain  Episode of chest tightness with HTN increased the lisinopril to 20

## 2013-06-22 ENCOUNTER — Other Ambulatory Visit: Payer: Self-pay | Admitting: Internal Medicine

## 2013-06-24 ENCOUNTER — Other Ambulatory Visit: Payer: Self-pay | Admitting: *Deleted

## 2013-06-24 ENCOUNTER — Encounter: Payer: Self-pay | Admitting: Internal Medicine

## 2013-06-29 ENCOUNTER — Encounter: Payer: Self-pay | Admitting: Cardiovascular Disease

## 2013-07-07 ENCOUNTER — Ambulatory Visit (HOSPITAL_COMMUNITY): Payer: 59 | Attending: Cardiology | Admitting: Radiology

## 2013-07-07 VITALS — BP 147/69 | HR 58 | Ht 74.0 in | Wt 232.0 lb

## 2013-07-07 DIAGNOSIS — R0609 Other forms of dyspnea: Secondary | ICD-10-CM | POA: Insufficient documentation

## 2013-07-07 DIAGNOSIS — I2510 Atherosclerotic heart disease of native coronary artery without angina pectoris: Secondary | ICD-10-CM | POA: Insufficient documentation

## 2013-07-07 DIAGNOSIS — E785 Hyperlipidemia, unspecified: Secondary | ICD-10-CM | POA: Insufficient documentation

## 2013-07-07 DIAGNOSIS — R0989 Other specified symptoms and signs involving the circulatory and respiratory systems: Secondary | ICD-10-CM | POA: Insufficient documentation

## 2013-07-07 DIAGNOSIS — Z8249 Family history of ischemic heart disease and other diseases of the circulatory system: Secondary | ICD-10-CM | POA: Insufficient documentation

## 2013-07-07 DIAGNOSIS — Z794 Long term (current) use of insulin: Secondary | ICD-10-CM | POA: Insufficient documentation

## 2013-07-07 DIAGNOSIS — I10 Essential (primary) hypertension: Secondary | ICD-10-CM | POA: Insufficient documentation

## 2013-07-07 DIAGNOSIS — R079 Chest pain, unspecified: Secondary | ICD-10-CM | POA: Insufficient documentation

## 2013-07-07 DIAGNOSIS — R0602 Shortness of breath: Secondary | ICD-10-CM

## 2013-07-07 DIAGNOSIS — E119 Type 2 diabetes mellitus without complications: Secondary | ICD-10-CM | POA: Insufficient documentation

## 2013-07-07 MED ORDER — TECHNETIUM TC 99M SESTAMIBI GENERIC - CARDIOLITE
11.0000 | Freq: Once | INTRAVENOUS | Status: AC | PRN
  Administered 2013-07-07: 11 via INTRAVENOUS

## 2013-07-07 MED ORDER — TECHNETIUM TC 99M SESTAMIBI GENERIC - CARDIOLITE
33.0000 | Freq: Once | INTRAVENOUS | Status: AC | PRN
  Administered 2013-07-07: 33 via INTRAVENOUS

## 2013-07-07 NOTE — Progress Notes (Signed)
[facility] SITE 3 NUCLEAR MED [address] [place], [place] [address] [phone]    Cardiology Nuclear Med Study  Nathaniel Thomas is a 62 y.o. male     MRN : [id number]     DOB: [birth year]  Procedure Date: 07/07/2013  Nuclear Med Background Indication for Stress Test:  Evaluation for Ischemia History: 05-2010 Myocardial Perfusion Imaging-Normal, EF=53%, and 02-2011 Cardiac CT: mild to moderate CAD Cardiac Risk Factors: Family History - CAD, Hypertension, IDDM,and Lipids  Symptoms: Chest Pain with/without exertion (last occurrence at present 2/10. Pain occurring daily x one month, and last all day at times per patient), and DOE   Nuclear Pre-Procedure Caffeine/Decaff Intake:  None NPO After: 7:00pm   Lungs:  clear O2 Sat: 95% on room air. IV 0.9% NS with Angio Cath:  22g  IV Site: R Hand  IV Started by:  Bonnita Levan, RN  Chest Size (in):  48 Cup Size: n/a  Height: 6\' 2"  (1.88 m)  Weight:  232 lb (105.235 kg)  BMI:  Body mass index is 29.77 kg/(m^2). Tech Comments:  BS @ 630 AM=133    Nuclear Med Study 1 or 2 day study: 1 day  Stress Test Type:  Stress  Reading MD: Tobias Alexander, MD  Order Authorizing Provider:  Olga Millers, MD  Resting Radionuclide: Technetium 34m Sestamibi  Resting Radionuclide Dose: 11.0 mCi   Stress Radionuclide:  Technetium 26m Sestamibi  Stress Radionuclide Dose: 33.0 mCi           Stress Protocol Rest HR: 58 Stress HR: 142  Rest BP: 147/69 Stress BP: 219/63  Exercise Time (min): 6:01 METS: 7.0   Predicted Max HR: 157 bpm % Max HR: 90.45 bpm Rate Pressure Product: 21308   Dose of Adenosine (mg):  n/a Dose of Lexiscan: n/a mg  Dose of Atropine (mg): n/a Dose of Dobutamine: n/a mcg/kg/min (at max HR)  Stress Test Technologist: Irean Hong, RN  Nuclear Technologist:  Harlow Asa, CNMT     Rest Procedure:  Myocardial perfusion imaging was performed at rest 45 minutes following the intravenous administration of Technetium  32m Sestamibi. Rest ECG: NSR - Normal EKG  Stress Procedure:  The patient exercised on the treadmill utilizing the Bruce Protocol for 6:01 minutes, RPE=15. The patient stopped due to DOE, bilateral leg fatigue L>R and complained of baseline chest tightness increasing mildly to 3-4/10 with exercise.There was a marked hypertensive response to exercise. The patient took his lisinopril this am. Technetium 24m Sestamibi was injected at peak exercise and myocardial perfusion imaging was performed after a brief delay. Stress ECG: No significant change from baseline ECG  QPS Raw Data Images:  Normal; no motion artifact; normal heart/lung ratio. Stress Images:  Normal homogeneous uptake in all areas of the myocardium. Rest Images:  Normal homogeneous uptake in all areas of the myocardium. Subtraction (SDS):  No evidence of ischemia. Transient Ischemic Dilatation (Normal <1.22):  1.03 Lung/Heart Ratio (Normal <0.45):  0.32  Quantitative Gated Spect Images QGS EDV:  124 ml QGS ESV:  59 ml  Impression Exercise Capacity:  Good exercise capacity. BP Response:  Hypertensive blood pressure response. Clinical Symptoms:  Mild chest pain/dyspnea. ECG Impression:  No significant ST segment change suggestive of ischemia. Comparison with Prior Nuclear Study: No previous nuclear study performed  Overall Impression:  Normal stress nuclear study.  LV Ejection Fraction: 52%.  LV Wall Motion:  NL LV Function; NL Wall Motion  Tobias Alexander, Rexene Edison 07/07/2013

## 2013-07-20 ENCOUNTER — Ambulatory Visit (INDEPENDENT_AMBULATORY_CARE_PROVIDER_SITE_OTHER): Payer: 59 | Admitting: Cardiology

## 2013-07-20 ENCOUNTER — Encounter: Payer: Self-pay | Admitting: Cardiology

## 2013-07-20 VITALS — BP 120/70 | HR 70 | Wt 242.0 lb

## 2013-07-20 DIAGNOSIS — E785 Hyperlipidemia, unspecified: Secondary | ICD-10-CM

## 2013-07-20 DIAGNOSIS — R079 Chest pain, unspecified: Secondary | ICD-10-CM

## 2013-07-20 DIAGNOSIS — I10 Essential (primary) hypertension: Secondary | ICD-10-CM

## 2013-07-20 DIAGNOSIS — I2510 Atherosclerotic heart disease of native coronary artery without angina pectoris: Secondary | ICD-10-CM

## 2013-07-20 NOTE — Assessment & Plan Note (Signed)
Symptoms have improved. Nuclear study is normal. I have asked him to take over-the-counter Prevacid to see if this improves his symptoms further. If they worsen in the future we could consider cardiac catheterization to fully assess. Based on history and symptoms are most consistent with GI etiology.

## 2013-07-20 NOTE — Assessment & Plan Note (Signed)
Blood pressure controlled. Continue present medications. 

## 2013-07-20 NOTE — Patient Instructions (Signed)
Your physician recommends that you schedule a follow-up appointment in: 3 MONTHS WITH DR CRENSHAW  

## 2013-07-20 NOTE — Assessment & Plan Note (Signed)
Continue aspirin and statin. 

## 2013-07-20 NOTE — Progress Notes (Signed)
HPI: FU CAD. Cardiac CT in July of 2012 revealed mild to moderate coronary artery disease. The patient's total coronary artery calcium score was 28.9, which is 42nd percentile for patient's matched age and gender. The calcium is all part of a mixed plaque in the proximal LAD which causes less than 50% stenosis. I last saw him in November of 2014 and he was describing chest pain. Enzymes negative. Nuclear study in December 2014 showed an ejection fraction of 52% and normal perfusion. We felt his symptoms were most likely GI in etiology. Since that time, he has had no exertional chest pain. Occasional mild dyspnea on exertion. No orthopnea, PND or pedal edema. He occasionally has mild tightness in his chest after eating certain foods but much improved.   Current Outpatient Prescriptions  Medication Sig Dispense Refill  . aspirin EC 325 MG tablet Take 1 tablet (325 mg total) by mouth daily.  30 tablet  0  . bimatoprost (LUMIGAN) 0.03 % ophthalmic solution Place 1 drop into both eyes at bedtime.      [name] CIALIS 20 MG tablet Take 1 tablet by mouth  daily as needed for  erectile dysfunction.  6 tablet  3  . HYDROmorphone (DILAUDID) 4 MG tablet Take 4 mg by mouth every 4 (four) hours as needed. For kidney stone pain       . Insulin Detemir (LEVEMIR FLEXPEN) 100 UNIT/ML SOPN Inject 20 Units into the skin 2 (two) times daily after a meal.  10 pen  3  . insulin detemir (LEVEMIR) 100 UNIT/ML injection Inject 0.3 mLs (30 Units total) into the skin daily.  30 mL  3  . lisinopril (PRINIVIL,ZESTRIL) 20 MG tablet Take 1 tablet (20 mg total) by mouth daily.  90 tablet  3  . metFORMIN (GLUCOPHAGE-XR) 500 MG 24 hr tablet Take 2 tablets (1,000 mg total) by mouth 2 (two) times daily.  360 tablet  3  . NON FORMULARY Take 2 tablets by mouth daily. melaluca vitality --      . NON FORMULARY Alpha-lopic acid 200 mg-Take one daily      . NOVOLOG MIX 70/30 FLEXPEN (70-30) 100 UNIT/ML SUPN Inject subcutaneously 20   units  two times daily with  a meal  45 mL  11  . Nutritional Supplements (PROSTATE ENZYME FORMULA PO) Take by mouth. Take 2 in pm      . ONE TOUCH ULTRA TEST test strip Use 3 times a day  300 each  3  . Pitavastatin Calcium (LIVALO) 4 MG TABS Take 1 tablet by mouth once a week.       No current facility-administered medications for this visit.     Past Medical History  Diagnosis Date  . Diabetes mellitus   . Hypertension   . Hyperlipidemia   . Chronic kidney disease   . Renal calculus or stone   . Glaucoma   . CAD (coronary artery disease)   . Skin cancer of nose     ears and hands    Past Surgical History  Procedure Laterality Date  . Left knee replacement  2002/ 2004  . Tonsillectomy    . Lithotripsy      was cancelled/pt passed stone  . Laminectomy  1983/1988  . Basal cell carcinoma excision      hands  . Ankle surgery      2013/ wears boot cast    History   Social History  . Marital Status: Married  Spouse Name: N/A    Number of Children: 2  . Years of Education: N/A   Occupational History  .      Retired   Social History Main Topics  . Smoking status: Never Smoker   . Smokeless tobacco: Never Used  . Alcohol Use: No  . Drug Use: No  . Sexual Activity: Yes   Other Topics Concern  . Not on file   Social History Narrative  . No narrative on file    ROS: no fevers or chills, productive cough, hemoptysis, dysphasia, odynophagia, melena, hematochezia, dysuria, hematuria, rash, seizure activity, orthopnea, PND, pedal edema, claudication. Remaining systems are negative.  Physical Exam: Well-developed well-nourished in no acute distress.  Skin is warm and dry.  HEENT is normal.  Neck is supple.  Chest is clear to auscultation with normal expansion.  Cardiovascular exam is regular rate and rhythm.  Abdominal exam nontender or distended. No masses palpated. Extremities show no edema. neuro grossly intact

## 2013-07-20 NOTE — Assessment & Plan Note (Signed)
Continue statin. 

## 2013-08-04 ENCOUNTER — Other Ambulatory Visit: Payer: Self-pay | Admitting: Internal Medicine

## 2013-08-19 ENCOUNTER — Other Ambulatory Visit: Payer: Self-pay | Admitting: Internal Medicine

## 2013-08-24 ENCOUNTER — Other Ambulatory Visit: Payer: Self-pay | Admitting: Internal Medicine

## 2013-08-29 ENCOUNTER — Other Ambulatory Visit: Payer: Self-pay | Admitting: *Deleted

## 2013-09-19 ENCOUNTER — Encounter: Payer: Self-pay | Admitting: Internal Medicine

## 2013-09-19 ENCOUNTER — Other Ambulatory Visit: Payer: Self-pay | Admitting: *Deleted

## 2013-10-07 ENCOUNTER — Other Ambulatory Visit: Payer: Self-pay | Admitting: Internal Medicine

## 2013-10-12 ENCOUNTER — Ambulatory Visit: Payer: 59 | Admitting: Cardiology

## 2013-10-21 ENCOUNTER — Other Ambulatory Visit: Payer: Self-pay | Admitting: Internal Medicine

## 2013-10-26 ENCOUNTER — Other Ambulatory Visit: Payer: Self-pay | Admitting: Internal Medicine

## 2013-11-08 ENCOUNTER — Telehealth: Payer: Self-pay | Admitting: Internal Medicine

## 2013-11-08 MED ORDER — INSULIN ASPART PROT & ASPART (70-30 MIX) 100 UNIT/ML PEN: PEN_INJECTOR | SUBCUTANEOUS | Status: DC

## 2013-11-08 NOTE — Telephone Encounter (Signed)
Done

## 2013-11-08 NOTE — Telephone Encounter (Signed)
[facility] is requesting a re-fill on NOVOLOG MIX 70/30 FLEXPEN (70-30) 100 UNIT/ML Pen

## 2013-11-11 ENCOUNTER — Ambulatory Visit (INDEPENDENT_AMBULATORY_CARE_PROVIDER_SITE_OTHER): Payer: 59 | Admitting: Internal Medicine

## 2013-11-11 ENCOUNTER — Telehealth: Payer: Self-pay | Admitting: Internal Medicine

## 2013-11-11 ENCOUNTER — Encounter: Payer: Self-pay | Admitting: Internal Medicine

## 2013-11-11 VITALS — BP 150/80 | HR 64 | Temp 98.0°F | Wt 230.0 lb

## 2013-11-11 DIAGNOSIS — IMO0001 Reserved for inherently not codable concepts without codable children: Secondary | ICD-10-CM

## 2013-11-11 DIAGNOSIS — I10 Essential (primary) hypertension: Secondary | ICD-10-CM

## 2013-11-11 DIAGNOSIS — E1165 Type 2 diabetes mellitus with hyperglycemia: Secondary | ICD-10-CM

## 2013-11-11 LAB — COMPREHENSIVE METABOLIC PANEL
ALT: 21 U/L (ref 0–53)
AST: 23 U/L (ref 0–37)
Albumin: 4.3 g/dL (ref 3.5–5.2)
Alkaline Phosphatase: 49 U/L (ref 39–117)
BUN: 16 mg/dL (ref 6–23)
CO2: 27 mEq/L (ref 19–32)
Calcium: 9.6 mg/dL (ref 8.4–10.5)
Chloride: 105 mEq/L (ref 96–112)
Creatinine, Ser: 0.8 mg/dL (ref 0.4–1.5)
GFR: 106.51 mL/min (ref >60.00)
Glucose, Bld: 121 mg/dL — ABNORMAL HIGH (ref 70–99)
Potassium: 4.7 mEq/L (ref 3.5–5.1)
Sodium: 138 mEq/L (ref 135–145)
Total Bilirubin: 0.6 mg/dL (ref 0.3–1.2)
Total Protein: 7.1 g/dL (ref 6.0–8.3)

## 2013-11-11 LAB — HEMOGLOBIN A1C: Hgb A1c MFr Bld: 6.7 % — ABNORMAL HIGH (ref 4.6–6.5)

## 2013-11-11 MED ORDER — VALACYCLOVIR HCL 500 MG PO TABS: 500.0000 mg | ORAL_TABLET | Freq: Two times a day (BID) | ORAL | Status: DC

## 2013-11-11 NOTE — Telephone Encounter (Signed)
Relevant patient education assigned to patient using Emmi. ° °

## 2013-11-11 NOTE — Progress Notes (Signed)
Pre visit review using our clinic review tool, if applicable. No additional management support is needed unless otherwise documented below in the visit note. 

## 2013-11-11 NOTE — Progress Notes (Signed)
Subjective:    Patient ID: Nathaniel Thomas, male    DOB: 10/25/1950, 63 y.o.   MRN: [id number]  Diabetes Pertinent negatives for diabetes include no fatigue and no weakness.  Hypertension Pertinent negatives include no neck pain or shortness of breath.  blood pressure at home is normal  Follow up  DM and HTN Weight stable cbg's improved  ( was on the Dr Irena Cords diet)   Review of Systems  Constitutional: Negative for fever and fatigue.  HENT: Negative for congestion, hearing loss and postnasal drip.   Eyes: Negative for discharge, redness and visual disturbance.  Respiratory: Negative for cough, shortness of breath and wheezing.   Cardiovascular: Negative for leg swelling.  Gastrointestinal: Negative for abdominal pain, constipation and abdominal distention.  Genitourinary: Negative for urgency and frequency.  Musculoskeletal: Negative for arthralgias, joint swelling and neck pain.  Skin: Negative for color change and rash.  Neurological: Negative for weakness and light-headedness.  Hematological: Negative for adenopathy.  Psychiatric/Behavioral: Negative for behavioral problems.   Past Medical History  Diagnosis Date  . Diabetes mellitus   . Hypertension   . Hyperlipidemia   . Chronic kidney disease   . Renal calculus or stone   . Glaucoma   . CAD (coronary artery disease)   . Skin cancer of nose     ears and hands    History   Social History  . Marital Status: Married    Spouse Name: N/A    Number of Children: 2  . Years of Education: N/A   Occupational History  .      Retired   Social History Main Topics  . Smoking status: Never Smoker   . Smokeless tobacco: Never Used  . Alcohol Use: No  . Drug Use: No  . Sexual Activity: Yes   Other Topics Concern  . Not on file   Social History Narrative  . No narrative on file    Past Surgical History  Procedure Laterality Date  . Left knee replacement  2002/ 2004  . Tonsillectomy    . Lithotripsy      was  cancelled/pt passed stone  . Laminectomy  1983/1988  . Basal cell carcinoma excision      hands  . Ankle surgery      2013/ wears boot cast    Family History  Problem Relation Age of Onset  . Heart disease Brother     Atrial fibrillation  . Heart disease Father     CHF  . Heart disease Mother     Allergies  Allergen Reactions  . Demerol Other (See Comments)    Blood pressure dropped completely out  . Meperidine Hcl Other (See Comments)    Drops blood pressure out  . Nitrospan [Nitroglycerin] Other (See Comments)    Blood pressure dropped out    Current Outpatient Prescriptions on File Prior to Visit  Medication Sig Dispense Refill  . aspirin EC 325 MG tablet Take 1 tablet (325 mg total) by mouth daily.  30 tablet  0  . bimatoprost (LUMIGAN) 0.03 % ophthalmic solution Place 1 drop into both eyes at bedtime.      [name] CIALIS 20 MG tablet Take 1 tablet by mouth  daily as needed for  erectile dysfunction.  6 tablet  3  . HYDROmorphone (DILAUDID) 4 MG tablet Take 4 mg by mouth every 4 (four) hours as needed. For kidney stone pain       . insulin detemir (LEVEMIR) 100 UNIT/ML injection  Inject 0.3 mLs (30 Units total) into the skin daily.  30 mL  3  . lisinopril (PRINIVIL,ZESTRIL) 20 MG tablet Take 1 tablet (20 mg total) by mouth daily.  90 tablet  3  . metFORMIN (GLUCOPHAGE-XR) 500 MG 24 hr tablet Take 2 tablets by mouth two times daily  360 tablet  1  . naproxen (NAPROSYN) 500 MG tablet Take 1 tablet (500 mg  total) by mouth 2 (two)  times daily with a meal.  180 tablet  1  . NON FORMULARY Take 2 tablets by mouth daily. Diabetic vitamin packet 1 daily      . NON FORMULARY Alpha-lopic acid 200 mg-Take one daily      . Nutritional Supplements (PROSTATE ENZYME FORMULA PO) Take by mouth. Take 2 in pm      . ONE TOUCH ULTRA TEST test strip Use 3 times a day  300 each  5  . Pitavastatin Calcium (LIVALO) 4 MG TABS Take 1 tablet by mouth once a week.       No current facility-administered  medications on file prior to visit.    BP 150/80  Pulse 64  Temp(Src) 98 F (36.7 C) (Oral)  Wt 230 lb (104.327 kg)        Objective:   Physical Exam  Constitutional: He appears well-developed and well-nourished.  HENT:  Head: Normocephalic and atraumatic.  Eyes: Conjunctivae are normal. Pupils are equal, round, and reactive to light.  Neck: Normal range of motion. Neck supple.  Cardiovascular: Normal rate and regular rhythm.   Pulmonary/Chest: Effort normal and breath sounds normal.  Abdominal: Soft. Bowel sounds are normal.          Assessment & Plan:  Hypoglycemia and  Elevated blood pressure with mild diaphoresis  HTN stable due cmet  A1c due  Otherwise up to date x lDL

## 2013-11-11 NOTE — Patient Instructions (Signed)
The patient is instructed to continue all medications as prescribed. Schedule followup with check out clerk upon leaving the clinic  

## 2013-11-23 ENCOUNTER — Ambulatory Visit (INDEPENDENT_AMBULATORY_CARE_PROVIDER_SITE_OTHER): Payer: 59 | Admitting: Cardiology

## 2013-11-23 ENCOUNTER — Encounter: Payer: Self-pay | Admitting: Cardiology

## 2013-11-23 VITALS — BP 140/80 | HR 59 | Ht 76.0 in | Wt 235.0 lb

## 2013-11-23 DIAGNOSIS — I2510 Atherosclerotic heart disease of native coronary artery without angina pectoris: Secondary | ICD-10-CM

## 2013-11-23 DIAGNOSIS — I10 Essential (primary) hypertension: Secondary | ICD-10-CM

## 2013-11-23 NOTE — Assessment & Plan Note (Signed)
Continue aspirin and statin. 

## 2013-11-23 NOTE — Assessment & Plan Note (Signed)
Blood pressure controlled. Continue present medications. 

## 2013-11-23 NOTE — Progress Notes (Signed)
HPI: FU CAD. Cardiac CT in July of 2012 revealed mild to moderate coronary artery disease. The patient's total coronary artery calcium score was 28.9, which is 42nd percentile for patient's matched age and gender. The calcium is all part of a mixed plaque in the proximal LAD which causes less than 50% stenosis. Nuclear study in December 2014 showed an ejection fraction of 52% and normal perfusion. I last saw him in Dec 2014. Since then, the patient denies any dyspnea on exertion, orthopnea, PND, pedal edema, palpitations, syncope or chest pain.    Current Outpatient Prescriptions  Medication Sig Dispense Refill  . aspirin EC 325 MG tablet Take 1 tablet (325 mg total) by mouth daily.  30 tablet  0  . bimatoprost (LUMIGAN) 0.03 % ophthalmic solution Place 1 drop into both eyes at bedtime.      [name] CIALIS 20 MG tablet Take 1 tablet by mouth  daily as needed for  erectile dysfunction.  6 tablet  3  . HYDROmorphone (DILAUDID) 4 MG tablet Take 4 mg by mouth every 4 (four) hours as needed. For kidney stone pain       . Insulin Aspart Prot & Aspart (NOVOLOG 70/30 MIX) (70-30) 100 UNIT/ML Pen Inject subcutaneously 15   units two times daily with  a meal      . Insulin Detemir (LEVEMIR FLEXTOUCH) 100 UNIT/ML Pen Inject subcutaneously 35 units  daily after a meal      . lisinopril (PRINIVIL,ZESTRIL) 20 MG tablet Take 1 tablet (20 mg total) by mouth daily.  90 tablet  3  . metFORMIN (GLUCOPHAGE-XR) 500 MG 24 hr tablet Take 2 tablets by mouth two times daily  360 tablet  1  . naproxen (NAPROSYN) 500 MG tablet Take 1 tablet (500 mg  total) by mouth  daily with a meal.      . NON FORMULARY Take 2 tablets by mouth daily. Diabetic vitamin packet 1 daily      . NON FORMULARY Alpha-lopic acid 200 mg-Take one daily      . Nutritional Supplements (PROSTATE ENZYME FORMULA PO) Take by mouth. Take 2 in pm      . ONE TOUCH ULTRA TEST test strip Use 3 times a day  300 each  5  . Pitavastatin Calcium (LIVALO) 2 MG  TABS Take 2 tablets by mouth daily.       No current facility-administered medications for this visit.     Past Medical History  Diagnosis Date  . Diabetes mellitus   . Hypertension   . Hyperlipidemia   . Chronic kidney disease   . Renal calculus or stone   . Glaucoma   . CAD (coronary artery disease)   . Skin cancer of nose     ears and hands    Past Surgical History  Procedure Laterality Date  . Left knee replacement  2002/ 2004  . Tonsillectomy    . Lithotripsy      was cancelled/pt passed stone  . Laminectomy  1983/1988  . Basal cell carcinoma excision      hands  . Ankle surgery      2013/ wears boot cast    History   Social History  . Marital Status: Married    Spouse Name: N/A    Number of Children: 2  . Years of Education: N/A   Occupational History  .      Retired   Social History Main Topics  . Smoking status: Never Smoker   .  Smokeless tobacco: Never Used  . Alcohol Use: No  . Drug Use: No  . Sexual Activity: Yes   Other Topics Concern  . Not on file   Social History Narrative  . No narrative on file    ROS: no fevers or chills, productive cough, hemoptysis, dysphasia, odynophagia, melena, hematochezia, dysuria, hematuria, rash, seizure activity, orthopnea, PND, pedal edema, claudication. Remaining systems are negative.  Physical Exam: Well-developed well-nourished in no acute distress.  Skin is warm and dry.  HEENT is normal.  Neck is supple.  Chest is clear to auscultation with normal expansion.  Cardiovascular exam is regular rate and rhythm.  Abdominal exam nontender or distended. No masses palpated. Extremities show no edema. neuro grossly intact  ECG Sinus rhythm at a rate of 59. Left ventricular hypertrophy. Nonspecific inferior lateral T-wave inversion.

## 2013-11-23 NOTE — Assessment & Plan Note (Signed)
Continue statin. 

## 2013-11-23 NOTE — Patient Instructions (Signed)
Your physician wants you to follow-up in: ONE YEAR WITH DR CRENSHAW You will receive a reminder letter in the mail two months in advance. If you don't receive a letter, please call our office to schedule the follow-up appointment.  

## 2013-11-25 ENCOUNTER — Telehealth: Payer: Self-pay

## 2013-11-25 NOTE — Telephone Encounter (Signed)
Relevant patient education assigned to patient using Emmi. ° °

## 2014-01-17 ENCOUNTER — Telehealth: Payer: Self-pay | Admitting: Internal Medicine

## 2014-01-17 NOTE — Telephone Encounter (Signed)
Call pt to est with Yong Channel he has not made a decision will call back

## 2014-02-21 ENCOUNTER — Other Ambulatory Visit: Payer: Self-pay | Admitting: Internal Medicine

## 2014-03-03 ENCOUNTER — Encounter: Payer: Self-pay | Admitting: *Deleted

## 2014-03-07 ENCOUNTER — Other Ambulatory Visit: Payer: Self-pay | Admitting: Internal Medicine

## 2014-03-14 ENCOUNTER — Telehealth: Payer: Self-pay | Admitting: Internal Medicine

## 2014-03-14 NOTE — Telephone Encounter (Signed)
Scheduled

## 2014-03-14 NOTE — Telephone Encounter (Signed)
[employer] denied PA for Novolog Mix.  Pt must have a history of failure of greater than 3 months, contraindication, or intolerance to Humalog or Humalog 75/25 or any of its inactive ingredients.

## 2014-03-17 ENCOUNTER — Other Ambulatory Visit: Payer: Self-pay | Admitting: Family Medicine

## 2014-03-17 DIAGNOSIS — E785 Hyperlipidemia, unspecified: Secondary | ICD-10-CM

## 2014-03-21 ENCOUNTER — Telehealth: Payer: Self-pay | Admitting: Internal Medicine

## 2014-03-21 NOTE — Telephone Encounter (Signed)
[facility] is requesting re-fill on HUMALOG [facility]

## 2014-03-21 NOTE — Telephone Encounter (Signed)
Pt stated he can no longer get novolog from [facility]  However he can get humalog [facility]. Pt would like to change med. Please sent rx [facility]

## 2014-03-22 NOTE — Telephone Encounter (Signed)
Per [name] change to Humalog with the same units.

## 2014-03-22 NOTE — Telephone Encounter (Signed)
Per Dr Arnoldo Morale change to Humalog with the same units.

## 2014-03-23 MED ORDER — INSULIN LISPRO PROT & LISPRO (75-25 MIX) 100 UNIT/ML KWIKPEN: 15.0000 [IU] | PEN_INJECTOR | Freq: Two times a day (BID) | SUBCUTANEOUS | Status: DC

## 2014-03-23 NOTE — Telephone Encounter (Signed)
rx sent in electroncially 

## 2014-03-23 NOTE — Telephone Encounter (Signed)
Re-fill wasn't sent in. Can you send it in please?

## 2014-03-23 NOTE — Telephone Encounter (Signed)
duplicate

## 2014-04-13 ENCOUNTER — Other Ambulatory Visit (INDEPENDENT_AMBULATORY_CARE_PROVIDER_SITE_OTHER): Payer: 59

## 2014-04-13 DIAGNOSIS — Z0000 Encounter for general adult medical examination without abnormal findings: Secondary | ICD-10-CM

## 2014-04-13 LAB — BASIC METABOLIC PANEL
BUN: 14 mg/dL (ref 6–23)
CO2: 28 mEq/L (ref 19–32)
Calcium: 9.1 mg/dL (ref 8.4–10.5)
Chloride: 106 mEq/L (ref 96–112)
Creatinine, Ser: 0.9 mg/dL (ref 0.4–1.5)
GFR: 91.34 mL/min (ref >60.00)
Glucose, Bld: 91 mg/dL (ref 70–99)
Potassium: 4.8 mEq/L (ref 3.5–5.1)
Sodium: 141 mEq/L (ref 135–145)

## 2014-04-13 LAB — LIPID PANEL
Cholesterol: 136 mg/dL (ref 0–200)
HDL: 27.6 mg/dL — ABNORMAL LOW (ref >39.00)
LDL Cholesterol: 89 mg/dL (ref 0–99)
NonHDL: 108.4
Total CHOL/HDL Ratio: 5
Triglycerides: 98 mg/dL (ref 0.0–149.0)
VLDL: 19.6 mg/dL (ref 0.0–40.0)

## 2014-04-13 LAB — POCT URINALYSIS DIPSTICK
Bilirubin, UA: NEGATIVE
Glucose, UA: NEGATIVE
Leukocytes, UA: NEGATIVE
Nitrite, UA: NEGATIVE
Spec Grav, UA: 1.02
Urobilinogen, UA: 0.2
pH, UA: 5.5

## 2014-04-13 LAB — CBC WITH DIFFERENTIAL/PLATELET
Basophils Absolute: 0 10*3/uL (ref 0.0–0.1)
Basophils Relative: 0.5 % (ref 0.0–3.0)
Eosinophils Absolute: 0.3 10*3/uL (ref 0.0–0.7)
Eosinophils Relative: 3.4 % (ref 0.0–5.0)
HCT: 38.3 % — ABNORMAL LOW (ref 39.0–52.0)
Hemoglobin: 12.9 g/dL — ABNORMAL LOW (ref 13.0–17.0)
Lymphocytes Relative: 20 % (ref 12.0–46.0)
Lymphs Abs: 1.5 10*3/uL (ref 0.7–4.0)
MCHC: 33.8 g/dL (ref 30.0–36.0)
MCV: 88.2 fl (ref 78.0–100.0)
Monocytes Absolute: 0.6 10*3/uL (ref 0.1–1.0)
Monocytes Relative: 8.2 % (ref 3.0–12.0)
Neutro Abs: 5.1 10*3/uL (ref 1.4–7.7)
Neutrophils Relative %: 67.9 % (ref 43.0–77.0)
Platelets: 230 10*3/uL (ref 150.0–400.0)
RBC: 4.34 Mil/uL (ref 4.22–5.81)
RDW: 14.5 % (ref 11.5–15.5)
WBC: 7.5 10*3/uL (ref 4.0–10.5)

## 2014-04-13 LAB — MICROALBUMIN / CREATININE URINE RATIO
Creatinine,U: 222.7 mg/dL
Microalb Creat Ratio: 2.7 mg/g (ref 0.0–30.0)
Microalb, Ur: 6.1 mg/dL — ABNORMAL HIGH (ref 0.0–1.9)

## 2014-04-13 LAB — URINALYSIS, MICROSCOPIC ONLY

## 2014-04-13 LAB — HEPATIC FUNCTION PANEL
ALT: 20 U/L (ref 0–53)
AST: 22 U/L (ref 0–37)
Albumin: 4 g/dL (ref 3.5–5.2)
Alkaline Phosphatase: 54 U/L (ref 39–117)
Bilirubin, Direct: 0.1 mg/dL (ref 0.0–0.3)
Total Bilirubin: 0.5 mg/dL (ref 0.2–1.2)
Total Protein: 6.7 g/dL (ref 6.0–8.3)

## 2014-04-13 LAB — TSH: TSH: 0.68 u[IU]/mL (ref 0.35–4.50)

## 2014-04-13 LAB — HEMOGLOBIN A1C: Hgb A1c MFr Bld: 7.6 % — ABNORMAL HIGH (ref 4.6–6.5)

## 2014-04-13 LAB — PSA: PSA: 6.81 ng/mL — ABNORMAL HIGH (ref 0.10–4.00)

## 2014-04-16 ENCOUNTER — Other Ambulatory Visit: Payer: Self-pay | Admitting: Internal Medicine

## 2014-04-18 ENCOUNTER — Ambulatory Visit: Payer: 59 | Admitting: Family Medicine

## 2014-04-19 ENCOUNTER — Ambulatory Visit (INDEPENDENT_AMBULATORY_CARE_PROVIDER_SITE_OTHER): Payer: 59 | Admitting: Family Medicine

## 2014-04-19 ENCOUNTER — Encounter: Payer: Self-pay | Admitting: Family Medicine

## 2014-04-19 VITALS — BP 140/80 | HR 72 | Temp 97.9°F | Wt 241.0 lb

## 2014-04-19 DIAGNOSIS — M25571 Pain in right ankle and joints of right foot: Secondary | ICD-10-CM

## 2014-04-19 DIAGNOSIS — I2510 Atherosclerotic heart disease of native coronary artery without angina pectoris: Secondary | ICD-10-CM

## 2014-04-19 DIAGNOSIS — G8929 Other chronic pain: Secondary | ICD-10-CM | POA: Insufficient documentation

## 2014-04-19 DIAGNOSIS — E109 Type 1 diabetes mellitus without complications: Secondary | ICD-10-CM

## 2014-04-19 DIAGNOSIS — M25579 Pain in unspecified ankle and joints of unspecified foot: Secondary | ICD-10-CM

## 2014-04-19 NOTE — Assessment & Plan Note (Signed)
History of fusion of ankle with recommendations by [name] for repeat surgery with screws or ankle replacment. Patient wants to explore nonsurgical options. Referral made to Dr. Oneida Alar of sports medicine who is an expert in nonsurgical management of foot/ankle issues.

## 2014-04-19 NOTE — Assessment & Plan Note (Signed)
Poor control based off of hypoglycemia and a1c above 7. Patient on very atypical regimen taking 40 units levemir twice a day (can get 3 month supply for $15) as well as humalog 75/25 15-20 units BID which is over 100 for 3 months. Patient currently getting 110 long acting to 10 units short acting. Trial 30 units BID levemir (want less hypoglycemia) with 10 units humalog without premix (sample given) and follow up in 1 week.

## 2014-04-19 NOTE — Patient Instructions (Signed)
Diabetes  Take levemir 30 units twice a day  Take humalog 10 units 3x a day  If you have any lows, call me next day to report when.   Continue current management if you have lows.   Call your insurance to see if they will cover non premixed humalog  See me in 1 week, Dr. Yong Channel

## 2014-04-19 NOTE — Assessment & Plan Note (Signed)
Asymptomatic. Continue aspirin and statin.  Lab Results  Component Value Date   LDLCALC 89 04/13/2014

## 2014-04-19 NOTE — Progress Notes (Signed)
Nathaniel Reddish, MD Phone: [phone]  Subjective:  Patient presents today to establish care with me as their new primary care provider. Patient was formerly a patient of [name]. Chief complaint-noted.   DIABETES Type II-poor control due to hypoglycemia and based off a1c  Lab Results  Component Value Date   HGBA1C 7.6* 04/13/2014   HGBA1C 6.7* 11/11/2013   HGBA1C 7.1* 06/20/2013  Medications taking and tolerating-yes, Humalog 75/25 between 15-20 units BID. Metformin 2g daily XL prescribed.  Checks CBG in am averages below 100, take 15-20 units humalog And also taking 40 units twice a day levemir  ROS- Endorses CVA tenderness. No polydipisia or viison changes. Patient feels hypoglycemia coming on at least once a day and eats a snack. He has woken up with lows as low as 44 within last month. Has low at night less frequently than daytime issus. Going to see urologist tomorrow. History of kidney stones. Hurting for over a month in CVA area both right and left. Dr. [name] at [name].   Ankle Pain-chronic stable issue -Ankle fusion 2 years ago with screws by [name]. Has chronic ankle pain-accupuncture has helped on medial ankle of left. Options were told were ankle replacement or more screws. Takes naproxyn morning and night. He is not interested in surgery at this time and requests other options. Used to wear orthotics but felt like their set up made things worse.  ROS- no weakness or instability  CAD-stable Followed by [name]. On aspirin and statin.  Ros-no chest pain or shortness of breath  The following were reviewed and entered/updated in epic: Past Medical History  Diagnosis Date  . Diabetes mellitus   . Hypertension   . Hyperlipidemia   . Chronic kidney disease   . Renal calculus or stone   . Glaucoma   . CAD (coronary artery disease)   . Skin cancer of nose     ears and hands  . CHICKENPOX, HX OF 08/30/2008    Qualifier: Diagnosis of  By: [name], [name]    .  History of urinary calculi 08/30/2008    Qualifier: Diagnosis of  By: [name], [name]    Patient Active Problem List   Diagnosis Date Noted  . CAD (coronary artery disease) 03/12/2011    Priority: High  . IDDM 08/30/2008    Priority: High  . 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 HYPERLIPIDEMIA, WITH LOW HDL 05/02/2009    Priority: Medium  . HYPERTENSION, BORDERLINE 03/27/2009    Priority: Medium  . URINARY INCONTINENCE 08/30/2008    Priority: Medium  . Chronic ankle pain 04/19/2014    Priority: Low  . Chest pain 06/15/2013    Priority: Low  . OSTEOARTHRITIS, CARPOMETACARPAL JOINT, RIGHT THUMB 09/04/2009    Priority: Low  . GERD 08/30/2008    Priority: Low  . ARTHRITIS 08/30/2008    Priority: Low   Past Surgical History  Procedure Laterality Date  . Left knee replacement  2002/ 2004  . Tonsillectomy    . Lithotripsy      was cancelled/pt passed stone  . Laminectomy  1983/1988  . Basal cell carcinoma excision      hands  . Ankle surgery      2013/ wears boot cast    Family History  Problem Relation Age of Onset  . Heart disease Brother     Atrial fibrillation  . Heart disease Father     CHF  . Heart disease Mother  Medications- reviewed and updated Current Outpatient Prescriptions  Medication Sig Dispense Refill  . aspirin EC 325 MG tablet Take 1 tablet (325 mg total) by mouth daily.  30 tablet  0  . HUMALOG MIX 75/25 KWIKPEN (75-25) 100 UNIT/ML Kwikpen Inject subcutaneously 15  units two times daily  30 mL  1  . Insulin Detemir (LEVEMIR FLEXTOUCH) 100 UNIT/ML Pen Inject subcutaneously 35 units  daily after a meal      . lisinopril (PRINIVIL,ZESTRIL) 20 MG tablet Take 1 tablet (20 mg total) by mouth daily.  90 tablet  3  . metFORMIN (GLUCOPHAGE-XR) 500 MG 24 hr tablet Take 2 tablets by mouth two times daily  180 tablet  2  . naproxen (NAPROSYN) 500 MG tablet Take 1 tablet by mouth  twice a day with a meal  180 tablet  2  . NON  FORMULARY Take 2 tablets by mouth daily. Diabetic vitamin packet 1 daily      . NON FORMULARY Alpha-lopic acid 200 mg-Take one daily      . Nutritional Supplements (PROSTATE ENZYME FORMULA PO) Take by mouth. Take 2 in pm      . ONE TOUCH ULTRA TEST test strip Use 3 times a day  300 each  5  . Pitavastatin Calcium (LIVALO) 2 MG TABS Take 2 tablets by mouth daily.      . bimatoprost (LUMIGAN) 0.03 % ophthalmic solution Place 1 drop into both eyes at bedtime.      [name] CIALIS 20 MG tablet Take 1 tablet by mouth  daily as needed for   erectile dysfunction.  6 tablet  5  . HYDROmorphone (DILAUDID) 4 MG tablet Take 4 mg by mouth every 4 (four) hours as needed. For kidney stone pain        No current facility-administered medications for this visit.    Allergies-reviewed and updated Allergies  Allergen Reactions  . Demerol Other (See Comments)    Blood pressure dropped completely out  . Meperidine Hcl Other (See Comments)    Drops blood pressure out  . Nitrospan [Nitroglycerin] Other (See Comments)    Blood pressure dropped out    History   Social History  . Marital Status: Married    Spouse Name: N/A    Number of Children: 2  . Years of Education: N/A   Occupational History  .      Retired   Social History Main Topics  . Smoking status: Never Smoker   . Smokeless tobacco: Never Used  . Alcohol Use: No  . Drug Use: No  . Sexual Activity: Yes   Other Topics Concern  . None   Social History Narrative  . None    ROS--See HPI   Objective: BP 140/80  Pulse 72  Temp(Src) 97.9 F (36.6 C)  Wt 241 lb (109.317 kg) Gen: NAD, resting comfortably  Assessment/Plan:  IDDM Poor control based off of hypoglycemia and a1c above 7. Patient on very atypical regimen taking 40 units levemir twice a day (can get 3 month supply for $15) as well as humalog 75/25 15-20 units BID which is over 100 for 3 months. Patient currently getting 110 long acting to 10 units short acting. Trial 30  units BID levemir (want less hypoglycemia) with 10 units humalog without premix (sample given) and follow up in 1 week.   CAD (coronary artery disease) Asymptomatic. Continue aspirin and statin.  Lab Results  Component Value Date   LDLCALC 89 04/13/2014     Chronic  ankle pain History of fusion of ankle with recommendations by [name] for repeat surgery with screws or ankle replacment. Patient wants to explore nonsurgical options. Referral made to Dr. Oneida Alar of sports medicine who is an expert in nonsurgical management of foot/ankle issues.

## 2014-04-20 ENCOUNTER — Encounter: Payer: Self-pay | Admitting: Family Medicine

## 2014-04-20 ENCOUNTER — Telehealth: Payer: Self-pay | Admitting: Family Medicine

## 2014-04-20 NOTE — Telephone Encounter (Signed)
Please see below.

## 2014-04-20 NOTE — Telephone Encounter (Signed)
Patient Information:  Caller Name: Hady  Phone: [phone]  Patient: Nathaniel Thomas, Nathaniel Thomas  Gender: Male  DOB: Apr 23, 1951  Age: 63 Years  PCP: [name]  Office Follow Up:  Does the office need to follow up with this patient?: Yes  Instructions For The Office: Please contact this pt. Going out of town at 13:45 (04/20/14). Have Glucose Control Problems. Blood sugars this morning dropping too low after dosage of insulin was changed on 04/19/14.  RN Note:  Please contact this pt. Going out of town at 13:45 (04/20/14). Have Glucose Control Problems. Blood sugars this morning dropping too low after dosage of insulin was changed on 04/19/14.  Symptoms  Reason For Call & Symptoms: Pt. saw Dr. Yong Channel on 04/19/14. Changed his Insulin to Humalog 15, and Levemir 35.Fasting glucose this morning 108mg /dl. Ate breakfast. Martin Majestic to Urology appt. At 09:00 BS dropped to 42. Office gave him Tarry Kos D and one Glucose tablet. At 10:00 BS was only 52mg /dl. Had package of 6 crackers. Blood sugar is now 93 at 11:00. Going out of town at 13:45. Wants to know what to do about his insulin. Will need to take it again at lunch.  Reviewed Health History In EMR: Yes  Reviewed Medications In EMR: Yes  Reviewed Allergies In EMR: Yes  Reviewed Surgeries / Procedures: Yes  Date of Onset of Symptoms: 04/20/2014  Guideline(s) Used:  Diabetes - Low Blood Sugar  Disposition Per Guideline:   Discuss with PCP and Callback by Nurse within 1 Hour  Reason For Disposition Reached:   Blood glucose < 70 mg/dl (3.9 mmol/l) or symptomatic AND cause unknown  Advice Given:  Call Back If:  There is no improvement within 30 minutes  Sleepiness or confusion occur  You become worse.  Patient Will Follow Care Advice:  YES

## 2014-04-20 NOTE — Telephone Encounter (Signed)
Discussed plan from AVS 30 units BID levemir 10 units humalog before meals.  Hold next dose anytime CBG <100.   Lows more worrisome than mild highs Gave Keba's # for patient to call if any issues

## 2014-04-23 ENCOUNTER — Encounter: Payer: Self-pay | Admitting: Family Medicine

## 2014-04-26 ENCOUNTER — Ambulatory Visit (INDEPENDENT_AMBULATORY_CARE_PROVIDER_SITE_OTHER): Payer: 59 | Admitting: Family Medicine

## 2014-04-26 ENCOUNTER — Encounter: Payer: Self-pay | Admitting: Family Medicine

## 2014-04-26 VITALS — BP 112/84 | HR 88 | Temp 97.7°F | Wt 241.0 lb

## 2014-04-26 DIAGNOSIS — E109 Type 1 diabetes mellitus without complications: Secondary | ICD-10-CM

## 2014-04-26 DIAGNOSIS — Z23 Encounter for immunization: Secondary | ICD-10-CM

## 2014-04-26 MED ORDER — INSULIN LISPRO 100 UNIT/ML (KWIKPEN): PEN_INJECTOR | SUBCUTANEOUS | Status: DC

## 2014-04-26 MED ORDER — PITAVASTATIN CALCIUM 2 MG PO TABS: ORAL_TABLET | ORAL | Status: DC

## 2014-04-26 NOTE — Patient Instructions (Addendum)
Continue Levimir 31 units twice a day.   Continue 10 units before breakfast and lunch of humalog if blood sugar is over 100.   For dinner, take 10 units if blood sugar 100-150 and 12 units if 150-200.   Send me an email next Monday to see how you are doing with this.   Foot exam normal today, thanks for doing your eye exam, pneumonia vaccine today. Get your flu shot in 1-2 months (call for flu clinic appointment on a Wednesday).

## 2014-04-26 NOTE — Progress Notes (Signed)
[name], MD Phone: [phone]  Subjective:   Nathaniel Thomas is a 63 y.o. year old very pleasant male patient who presents with the following:  Diabetes II-improving control with no hypoglycemia Lab Results  Component Value Date   HGBA1C 7.6* 04/13/2014  Now up to 31 levemir twice a day. Fasting 189-203. After breakfast111-183. Before lunch 107-110, after lunch 94-198. Before dinner 177-159, after dinner 234-256.  Of note patient is about to start a diet and exercise program. ROS-no hypoglycemia since we last spoke, no polyuria or polydipsia, no blurry vision  Past Medical History- Patient Active Problem List   Diagnosis Date Noted  . CAD (coronary artery disease) 03/12/2011    Priority: High  . IDDM 08/30/2008    Priority: High  . 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 HYPERLIPIDEMIA, WITH LOW HDL 05/02/2009    Priority: Medium  . HYPERTENSION, BORDERLINE 03/27/2009    Priority: Medium  . URINARY INCONTINENCE 08/30/2008    Priority: Medium  . Chronic ankle pain 04/19/2014    Priority: Low  . Chest pain 06/15/2013    Priority: Low  . OSTEOARTHRITIS, CARPOMETACARPAL JOINT, RIGHT THUMB 09/04/2009    Priority: Low  . GERD 08/30/2008    Priority: Low  . ARTHRITIS 08/30/2008    Priority: Low   Medications- reviewed and updated Current Outpatient Prescriptions  Medication Sig Dispense Refill  . aspirin EC 325 MG tablet Take 1 tablet (325 mg total) by mouth daily.  30 tablet  0  . bimatoprost (LUMIGAN) 0.03 % ophthalmic solution Place 1 drop into both eyes at bedtime.      [name] HYDROmorphone (DILAUDID) 4 MG tablet Take 4 mg by mouth every 4 (four) hours as needed. For kidney stone pain       . Insulin Detemir (LEVEMIR FLEXTOUCH) 100 UNIT/ML Pen Inject subcutaneously 31units twice a day      . lisinopril (PRINIVIL,ZESTRIL) 20 MG tablet Take 1 tablet (20 mg total) by mouth daily.  90 tablet  3  . metFORMIN (GLUCOPHAGE-XR) 500 MG 24 hr tablet Take  2 tablets by mouth two times daily  180 tablet  2  . naproxen (NAPROSYN) 500 MG tablet Take 1 tablet by mouth  twice a day with a meal  180 tablet  2  . NON FORMULARY Take 2 tablets by mouth daily. Diabetic vitamin packet 1 daily      . NON FORMULARY Alpha-lopic acid 200 mg-Take one daily      . Nutritional Supplements (PROSTATE ENZYME FORMULA PO) Take by mouth. Take 2 in pm      . ONE TOUCH ULTRA TEST test strip Use 3 times a day  300 each  5  . Pitavastatin Calcium (LIVALO) 2 MG TABS Twice a week  30 tablet  5  . CIALIS 20 MG tablet Take 1 tablet by mouth  daily as needed for   erectile dysfunction.  6 tablet  5  . insulin lispro (HUMALOG) 100 UNIT/ML KiwkPen 10 units before breakfast and lunch and dinner if sugar>100, or if sugar >150 before dinner, take 12 units.  15 mL  3   No current facility-administered medications for this visit.    Objective: BP 112/84  Pulse 88  Temp(Src) 97.7 F (36.5 C)  Wt 241 lb (109.317 kg) Gen: NAD, resting comfortably in chair Ext: no edema DM foot exam normal, 2+ DP pulses  Assessment/Plan:  IDDM Improving control and most importantly -no hypoglycemia.   Continue Levimir 31 units  twice a day.   Continue Humalog 10 units before breakfast and lunch of humalog if blood sugar is over 100.   For dinner, take 10 units if blood sugar 100-150 and 12 units if 150-200.   Mychart follow up 1 week. Foot exam normal today, thanks for doing your eye exam, pneumonia vaccine today. Get your flu shot in 1-2 months (call for flu clinic appointment on a Wednesday). Stressed for any change (may decrease as comes off cipro or as exercises-let me know promptly so we can titrate down).     Orders Placed This Encounter  Procedures  . Pneumococcal polysaccharide vaccine 23-valent greater than or equal to 2yo subcutaneous/IM    Meds ordered this encounter  Medications  . Pitavastatin Calcium (LIVALO) 2 MG TABS    Sig: Twice a week    Dispense:  30 tablet     Refill:  5  . insulin lispro (HUMALOG) 100 UNIT/ML KiwkPen    Sig: 10 units before breakfast and lunch and dinner if sugar>100, or if sugar >150 before dinner, take 12 units.    Dispense:  15 mL    Refill:  3

## 2014-04-26 NOTE — Assessment & Plan Note (Signed)
Improving control and most importantly -no hypoglycemia.   Continue Levimir 31 units twice a day.   Continue Humalog 10 units before breakfast and lunch of humalog if blood sugar is over 100.   For dinner, take 10 units if blood sugar 100-150 and 12 units if 150-200.   Mychart follow up 1 week. Foot exam normal today, thanks for doing your eye exam, pneumonia vaccine today. Get your flu shot in 1-2 months (call for flu clinic appointment on a Wednesday). Stressed for any change (may decrease as comes off cipro or as exercises-let me know promptly so we can titrate down).

## 2014-05-01 ENCOUNTER — Encounter: Payer: Self-pay | Admitting: Family Medicine

## 2014-05-02 ENCOUNTER — Telehealth: Payer: Self-pay | Admitting: Family Medicine

## 2014-05-02 NOTE — Telephone Encounter (Signed)
Rodman Key from [facility]  Called to ask for clarification on which humalog quick pen to fill    Phone number; [phone] hit option 1 to speak with pharmacy

## 2014-05-02 NOTE — Telephone Encounter (Signed)
Clarification made

## 2014-05-03 NOTE — Telephone Encounter (Signed)
Done by Bevelyn Ngo

## 2014-05-09 ENCOUNTER — Other Ambulatory Visit: Payer: Self-pay

## 2014-05-09 ENCOUNTER — Encounter: Payer: Self-pay | Admitting: Family Medicine

## 2014-05-09 MED ORDER — LISINOPRIL 20 MG PO TABS: 20.0000 mg | ORAL_TABLET | Freq: Every day | ORAL | Status: DC

## 2014-05-18 ENCOUNTER — Ambulatory Visit (INDEPENDENT_AMBULATORY_CARE_PROVIDER_SITE_OTHER): Payer: 59 | Admitting: Sports Medicine

## 2014-05-18 ENCOUNTER — Telehealth: Payer: Self-pay | Admitting: Family Medicine

## 2014-05-18 ENCOUNTER — Encounter: Payer: Self-pay | Admitting: Sports Medicine

## 2014-05-18 VITALS — BP 134/65 | Ht 76.0 in | Wt 225.0 lb

## 2014-05-18 DIAGNOSIS — R2689 Other abnormalities of gait and mobility: Secondary | ICD-10-CM

## 2014-05-18 DIAGNOSIS — M25571 Pain in right ankle and joints of right foot: Secondary | ICD-10-CM

## 2014-05-18 DIAGNOSIS — G8929 Other chronic pain: Secondary | ICD-10-CM

## 2014-05-18 DIAGNOSIS — R269 Unspecified abnormalities of gait and mobility: Secondary | ICD-10-CM

## 2014-05-18 MED ORDER — NITROGLYCERIN 0.2 MG/HR TD PT24: MEDICATED_PATCH | TRANSDERMAL | Status: DC

## 2014-05-18 MED ORDER — METFORMIN HCL ER 500 MG PO TB24: 500.0000 mg | ORAL_TABLET | Freq: Every day | ORAL | Status: DC

## 2014-05-18 NOTE — Patient Instructions (Signed)
Nitroglycerin Protocol   Apply 1/4 nitroglycerin patch to affected area daily.  Change position of patch within the affected area every 24 hours.  You may experience a headache during the first 1-2 weeks of using the patch, these should subside.  If you experience headaches after beginning nitroglycerin patch treatment, you may take your preferred over the counter pain reliever.  Another side effect of the nitroglycerin patch is skin irritation or rash related to patch adhesive.  Please notify our office if you develop more severe headaches or rash, and stop the patch.  Tendon healing with nitroglycerin patch may require 12 to 24 weeks depending on the extent of injury.  Men should not use if taking Viagra, Cialis, or Levitra. - Do not use the patch for 36 hours after taking Cialis  Do not use if you have migraines or rosacea.

## 2014-05-18 NOTE — Telephone Encounter (Signed)
Nathaniel Thomas is requesting re-fill on metFORMIN (GLUCOPHAGE-XR) 500 MG 24 hr tablet

## 2014-05-18 NOTE — Telephone Encounter (Signed)
Medication refilled

## 2014-05-19 ENCOUNTER — Encounter: Payer: Self-pay | Admitting: Family Medicine

## 2014-05-19 DIAGNOSIS — R2689 Other abnormalities of gait and mobility: Secondary | ICD-10-CM | POA: Insufficient documentation

## 2014-05-19 DIAGNOSIS — E119 Type 2 diabetes mellitus without complications: Secondary | ICD-10-CM

## 2014-05-19 NOTE — Progress Notes (Signed)
Nathaniel Thomas - 63 y.o. male MRN [id number]  Date of birth: 1951-02-12  CC & HPI:  The patient presents on referall from Dr. Yong Channel  for evaluation of: Right Ankle Pain: Pt with diffuse right ankle pain worse medially.  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  Recommended for calcaneal/talar/tibial fusion with IM rod vs total ankle replacement but pt hesistant and would like to avoid surgery.  Worse with prolonged standing/walking.  Would like to be more mobile to help with controlling weight and managing DM. Denies significant neuropathic sx such as burning or tingling.  Denies significant midfoot pain. No hx of chronic non-healing wounds.  Prior base of 5th metatarsal fx as well  ROS:  Per HPI.   HISTORY: Past Medical, Surgical, Social, and Family History Reviewed & Updated per EMR.  Pertinent Historical Findings include: Insulin Dependent DM, CAD w/o MI, ED with q monthly cialis HTN, GERD,  Prior Ankle fusion Non-smoker  DATA REVIEWED:  Recent Labs  06/20/13 1235 11/11/13 1203 04/13/14 0853  CREATININE 0.9 0.8 0.9  HGB  --   --  12.9*  K 4.4 4.7 4.8  HGBA1C 7.1* 6.7* 7.6*  TRIG  --   --  98.0  CHOL  --   --  136  HDL  --   --  27.60*  LDLCALC  --   --  89  TSH  --   --  0.68   BP Readings from Last 5 Encounters:  05/18/14 134/65  04/26/14 112/84  04/19/14 140/80  11/23/13 140/80  11/11/13 150/80   Wt Readings from Last 5 Encounters:  05/18/14 225 lb (102.059 kg)  04/26/14 241 lb (109.317 kg)  04/19/14 241 lb (109.317 kg)  11/23/13 235 lb (106.595 kg)  11/11/13 230 lb (104.327 kg)     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 Hardwear intact without evidence of loosening.  OBJECTIVE FINDINGS:  VS:   HT:6\' 4"  (193 cm)   WT:225 lb (102.059 kg)  BMI:27.4          BP:134/65 mmHg  HR: bpm  TEMP: ( )  RESP:   PHYSICAL EXAM: GENERAL: Adult large build caucasian  male. In no discomfort; no respiratory distress   PSYCH:  alert and appropriate, good insight   NEURO: Sensation is intact to light touch in bilateral LEs  VASCULAR: B DP and PT pulses 1+/4.  No significant edema.    Right Foot EXAM: Appearance: Significant lateral deformity of base of 5th.  Hypertrophic changes around ankle with well healed surgical scars.   Skin: No overlying erythema/ecchymosis.  Palpation: TTP over: Posterior Tibialis tendons, peroneals  No TTP over: Talar dome, calcaneous  Strength & ROM: Limited ROM in: 10o Dorsi-flexion/plantar-flexion, no ankle inversion/eversion. Appropriate mid-foot Weakness in: mid-foot inversion/eversion  Special Tests: No significant callus formation on dorsum of foot but early rocker bottom deformity of R foot.   Significant trendelenberg and antalgic in excessive supination. Significant shoe wear. Leg Lengths R:L :: 96:98 Genus Valgus on R     Limited MSK Ultrasound of Right Ankle: Findings: Calcific changes without PT and Peroneals. Tendons intact. Significant osseous irregularity around ankle joint and evidence of malunion of base of 5th prominence.  Significant calcification of vasculature, compressible PT vein  Impression: The above findings are consistent with prior ankle fusion with calcific changes within Peroneal>PT     ASSESSMENT: 1. 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  Sig leg length discrepancy likely due to  Known DM.  PLAN: See problem based charting & AVS for additional documentation. - NITRO protocol - disucssed avoidance of PDE inhibitors. - Body Helix full ankle compression sleeve. Fit today and reported improved stability - Heel lift with lateral positioning.  Encouraged to purchase new shoes. > when returns consider custom orthotics with additional padding for prominent base of 5th. Will need leg length correction and may need to consider potential midsole lift via biotech > Return in about 6  weeks (around 06/29/2014).

## 2014-05-23 ENCOUNTER — Other Ambulatory Visit: Payer: Self-pay | Admitting: Family Medicine

## 2014-05-23 MED ORDER — METFORMIN HCL ER 500 MG PO TB24: 1000.0000 mg | ORAL_TABLET | Freq: Two times a day (BID) | ORAL | Status: DC

## 2014-06-15 ENCOUNTER — Ambulatory Visit: Payer: 59 | Admitting: Family Medicine

## 2014-06-15 ENCOUNTER — Ambulatory Visit (INDEPENDENT_AMBULATORY_CARE_PROVIDER_SITE_OTHER): Payer: 59 | Admitting: Family Medicine

## 2014-06-15 ENCOUNTER — Encounter: Payer: Self-pay | Admitting: Family Medicine

## 2014-06-15 ENCOUNTER — Ambulatory Visit (INDEPENDENT_AMBULATORY_CARE_PROVIDER_SITE_OTHER): Payer: 59

## 2014-06-15 VITALS — BP 140/78 | Temp 97.5°F | Wt 226.0 lb

## 2014-06-15 DIAGNOSIS — L259 Unspecified contact dermatitis, unspecified cause: Secondary | ICD-10-CM

## 2014-06-15 DIAGNOSIS — Z23 Encounter for immunization: Secondary | ICD-10-CM

## 2014-06-15 MED ORDER — METHYLPREDNISOLONE 4 MG PO KIT: PACK | ORAL | Status: DC

## 2014-06-15 NOTE — Progress Notes (Signed)
[name], MD Phone: [phone]  Subjective:   Nathaniel Thomas is a 63 y.o. year old very pleasant male patient who presents with the following:  Rash/Poison Ivy -Right arm with several vesicular lesions starting yesterday after working with a tree on Tuesday. Intensely pruritic. States he saw poison ivy but was unable to avoid it. Started medrol dose pack that he had at home but noted it expired in 2014. States his head itched as well as his chin (wonders if he scratched his face/head). Symptoms have started to improve with medrol dose pack.  ROS-not ill appearing, no fever/chills. No new medications. Not immunocompromised. No mucus membrane involvement.   Past Medical History- Patient Active Problem List   Diagnosis Date Noted  . CAD (coronary artery disease) 03/12/2011    Priority: High  . IDDM 08/30/2008    Priority: High  . 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 HYPERLIPIDEMIA, WITH LOW HDL 05/02/2009    Priority: Medium  . HYPERTENSION, BORDERLINE 03/27/2009    Priority: Medium  . URINARY INCONTINENCE 08/30/2008    Priority: Medium  . Chronic ankle pain 04/19/2014    Priority: Low  . Chest pain 06/15/2013    Priority: Low  . OSTEOARTHRITIS, CARPOMETACARPAL JOINT, RIGHT THUMB 09/04/2009    Priority: Low  . GERD 08/30/2008    Priority: Low  . ARTHRITIS 08/30/2008    Priority: Low  . Antalgic gait 05/19/2014   Medications- reviewed and updated Current Outpatient Prescriptions  Medication Sig Dispense Refill  . aspirin EC 325 MG tablet Take 1 tablet (325 mg total) by mouth daily. 30 tablet 0  . HUMALOG MIX 75/25 KWIKPEN (75-25) 100 UNIT/ML Kwikpen     . HYDROmorphone (DILAUDID) 4 MG tablet Take 4 mg by mouth every 4 (four) hours as needed. For kidney stone pain     . Insulin Detemir (LEVEMIR FLEXTOUCH) 100 UNIT/ML Pen Inject subcutaneously 31units twice a day    . insulin lispro (HUMALOG) 100 UNIT/ML KiwkPen 10 units before breakfast and  lunch and dinner if sugar>100, or if sugar >150 before dinner, take 12 units. 15 mL 3  . lisinopril (PRINIVIL,ZESTRIL) 20 MG tablet Take 1 tablet (20 mg total) by mouth daily. 90 tablet 3  . metFORMIN (GLUCOPHAGE-XR) 500 MG 24 hr tablet Take 2 tablets (1,000 mg total) by mouth 2 (two) times daily. 360 tablet 3  . NON FORMULARY Take 2 tablets by mouth daily. Diabetic vitamin packet 1 daily    . NON FORMULARY Alpha-lopic acid 200 mg-Take one daily    . Nutritional Supplements (PROSTATE ENZYME FORMULA PO) Take by mouth. Take 2 in pm    . ONE TOUCH ULTRA TEST test strip Use 3 times a day 300 each 5  . Pitavastatin Calcium (LIVALO) 2 MG TABS Twice a week 30 tablet 5  . tamsulosin (FLOMAX) 0.4 MG CAPS capsule     . bimatoprost (LUMIGAN) 0.03 % ophthalmic solution Place 1 drop into both eyes at bedtime.    [name] CIALIS 20 MG tablet Take 1 tablet by mouth  daily as needed for   erectile dysfunction. 6 tablet 5  . methylPREDNISolone (MEDROL DOSEPAK) 4 MG tablet follow package directions. Start on day 2. 21 tablet 0  . naproxen (NAPROSYN) 500 MG tablet Take 1 tablet by mouth  twice a day with a meal 180 tablet 2  . nitroGLYCERIN (NITRODUR - DOSED IN MG/24 HR) 0.2 mg/hr patch Place 1/4 of patch over affected region. Remove and replace once  daily.  Slightly alter skin placement daily 30 patch 0   No current facility-administered medications for this visit.    Objective: BP 140/78 mmHg  Temp(Src) 97.5 F (36.4 C)  Wt 226 lb (102.513 kg) Gen: NAD, resting comfortably in chair No lip or tongue swelling CV: RRR no murmurs rubs or gallops Lungs: CTAB no crackles, wheeze, rhonchi Abdomen: soft/nontender/nondistended/normal bowel sounds. No rebound or guarding.  Ext/skin: no edema, right arm with several vesicular lesions on forearm (not in dermatomal distribution). No obvious changes on scalp or chin.  Neuro: grossly normal, moves all extremities   Assessment/Plan:  Poison Ivy/contact dermatitis I  discussed with patient that I would like to avoid systemic steroids. Offered triamcinolone. Patient is concerned about scalp and his chin and requested refill on Medrol Dosepak. I provided this.I told him this could drive his blood sugars up.   Of note the patient is taking a regimen is Humalog 75/25 and Levemir that I advised against in the past. I had advised Levemir 30 units twice a day with Humalog before meals. He is taking the 75/25 as well as well as the Levemir. We got him set up with endocrinology and he will see them next week. I'm hopeful they will be able to find a regimen that will be able to satisfy the patient. He is currently eating 6 meals a day and it is difficult for me to titrate for this case as well as the fact that he alters his regimen without medical advice.  Meds ordered this encounter  Medications  . methylPREDNISolone (MEDROL DOSEPAK) 4 MG tablet    Sig: follow package directions. Start on day 2.    Dispense:  21 tablet    Refill:  0

## 2014-06-15 NOTE — Patient Instructions (Signed)
Sorry you got into this. Take medrol dose pack and finish course. May run your sugars up. As long as we stay under 350 or so can just finish out. We are looking into your endocrine referral.   Poison [facility] ivy is a rash caused by touching the leaves of the poison ivy plant. The rash often shows up 48 hours later. You might just have bumps, redness, and itching. Sometimes, blisters appear and break open. Your eyes may get puffy (swollen). Poison ivy often heals in 2 to 3 weeks without treatment. HOME CARE  If you touch poison ivy:  Wash your skin with soap and water right away. Wash under your fingernails. Do not rub the skin very hard.  Wash any clothes you were wearing.  Avoid poison ivy in the future. Poison ivy has 3 leaves on a stem.  Use medicine to help with itching as told by your doctor. Do not drive when you take this medicine.  Keep open sores dry, clean, and covered with a bandage and medicated cream, if needed.  Ask your doctor about medicine for children. GET HELP RIGHT AWAY IF:  You have open sores.  Redness spreads beyond the area of the rash.  There is yellowish white fluid (pus) coming from the rash.  Pain gets worse.  You have a temperature by mouth above 102 F (38.9 C), not controlled by medicine. MAKE SURE YOU:  Understand these instructions.  Will watch your condition.  Will get help right away if you are not doing well or get worse. Document Released: 08/23/2010 Document Revised: 10/13/2011 Document Reviewed: 08/23/2010 [facility] Patient Information 2015 [place], [place]. This information is not intended to replace advice given to you by your health care provider. Make sure you discuss any questions you have with your health care provider.

## 2014-06-20 ENCOUNTER — Encounter: Payer: Self-pay | Admitting: Nurse Practitioner

## 2014-06-20 ENCOUNTER — Ambulatory Visit (INDEPENDENT_AMBULATORY_CARE_PROVIDER_SITE_OTHER): Payer: 59 | Admitting: Nurse Practitioner

## 2014-06-20 VITALS — BP 130/87 | HR 78 | Temp 97.7°F | Resp 18 | Ht 76.0 in | Wt 228.0 lb

## 2014-06-20 DIAGNOSIS — L255 Unspecified contact dermatitis due to plants, except food: Secondary | ICD-10-CM

## 2014-06-20 MED ORDER — METHYLPREDNISOLONE ACETATE 40 MG/ML IJ SUSP
40.0000 mg | Freq: Once | INTRAMUSCULAR | Status: AC
  Administered 2014-06-20: 40 mg via INTRAMUSCULAR

## 2014-06-20 MED ORDER — PREDNISONE 5 MG PO TABS: ORAL_TABLET | ORAL | Status: DC

## 2014-06-20 NOTE — Progress Notes (Signed)
Pre visit review using our clinic review tool, if applicable. No additional management support is needed unless otherwise documented below in the visit note. 

## 2014-06-20 NOTE — Progress Notes (Signed)
   Subjective:    Patient ID: Nathaniel Thomas, male    DOB: May 06, 1951, 63 y.o.   MRN: [id number]  Nathaniel Thomas This is a new problem. The current episode started 1 to 4 weeks ago (about 10 days). The problem has been gradually worsening since onset. The affected locations include the face, right lower leg and right arm. The rash is characterized by blistering, itchiness and redness. He was exposed to plant contact. Associated symptoms include facial edema (mild R upper eyelid swelling). Pertinent negatives include no cough, fever or shortness of breath. Past treatments include oral steroids and anti-itch cream (just finished 5 days prednisone taper yesterday). The treatment provided mild relief.      Review of Systems  Constitutional: Negative for fever.  Respiratory: Negative for cough and shortness of breath.   Skin: Positive for rash.       Objective:   Physical Exam  Constitutional: He is oriented to person, place, and time. He appears well-developed and well-nourished. No distress.  HENT:  Head: Normocephalic and atraumatic.  Eyes: Conjunctivae are normal. Right eye exhibits no discharge. Left eye exhibits no discharge.  Red swollen upper R lid, mild  Cardiovascular: Normal rate.   Pulmonary/Chest: Effort normal. No respiratory distress.  Neurological: He is alert and oriented to person, place, and time.  Skin: Skin is warm and dry. Rash noted.  Clusters of vesicles & linear pattern R forearm, R lower Leg, R eyelid  Psychiatric: He has a normal mood and affect. His behavior is normal. Thought content normal.  Vitals reviewed.         Assessment & Plan:  1. Plant dermatitis - methylPREDNISolone acetate (DEPO-MEDROL) injection 40 mg; Inject 1 mL (40 mg total) into the muscle once. - predniSONE (DELTASONE) 5 MG tablet; Take 5t po X3d, then 4t po X 3d, 3t po X 3d, 2T po x 3d, 1T po x3d  Dispense: 45 tablet; Refill: 0  See patient instructions for complete plan. F/u PRN

## 2014-06-20 NOTE — Patient Instructions (Signed)
Start prednisone tablets tomorrow.  Take in morning, as medication can keep you awake. Wash rash daily with mild soap-Dove bar soap.  Let us know if you develop signs of infection- red area surrounding blisters, red streaks, fever, pus.  Continue to monitor blood sugars.

## 2014-06-21 ENCOUNTER — Ambulatory Visit: Payer: 59 | Admitting: Endocrinology

## 2014-06-26 ENCOUNTER — Encounter: Payer: Self-pay | Admitting: Internal Medicine

## 2014-06-26 ENCOUNTER — Ambulatory Visit (INDEPENDENT_AMBULATORY_CARE_PROVIDER_SITE_OTHER): Payer: 59 | Admitting: Internal Medicine

## 2014-06-26 VITALS — BP 126/70 | HR 90 | Temp 97.9°F | Resp 12 | Ht 75.5 in | Wt 223.0 lb

## 2014-06-26 DIAGNOSIS — IMO0001 Reserved for inherently not codable concepts without codable children: Secondary | ICD-10-CM

## 2014-06-26 DIAGNOSIS — E1165 Type 2 diabetes mellitus with hyperglycemia: Secondary | ICD-10-CM

## 2014-06-26 MED ORDER — GLIPIZIDE 5 MG PO TABS
5.0000 mg | ORAL_TABLET | Freq: Two times a day (BID) | ORAL | Status: DC
Start: 2014-06-26 — End: 2014-07-12

## 2014-06-26 NOTE — Progress Notes (Signed)
Patient ID: Nathaniel Thomas, male   DOB: [birth year], 63 y.o.   MRN: [id number]  HPI: Nathaniel Thomas is a 63 y.o.-year-old male, referred by his PCP, Dr. Yong Channel, for management of DM2, dx 1999, insulin-dependent since 2008, uncontrolled, without complications.  Last hemoglobin A1c was: Lab Results  Component Value Date   HGBA1C 7.6* 04/13/2014   HGBA1C 6.7* 11/11/2013   HGBA1C 7.1* 06/20/2013  He had a steroid inj and Prednisone taper last week.   Pt is on a regimen of: - Metformin XR 1000 mg po bid - Humalog 75/25 15 in am, 10-20 at lunch, 8-20 at dinner - Levemir 20-40 units at bedtime  Pt checks his sugars 6x a day and they are: - am: 56, 86-143 - 2h after b'fast: 61, 80-132 - before lunch: 87-160, 210 - 2h after lunch: 67, 104-175, 240 - before dinner: 136-204, 240 - 2h after dinner: 107-200, 279 No lows. Lowest sugar was 32; he has hypoglycemia awareness at 60.  Highest sugar was 250 (Prednisone).  Pt lost 19 lbs in last [redacted] weeks along with his wife. Eats 6x a day, drinks 8 glasses of water; 4 servings of veggies a day. Pt's meals are: - Breakfast: [place] bacon, 1-2 eggs, butter; protein shake 3x a week; toast - Lunch: salad - large; rotisserie chicken or cheese - Dinner: chicken chilli, brown rice - Snacks: fruits, fruit cake; nuts; black bean chips  He exercises 5 times a week by doing cardio and lifting weights.  - no CKD, last BUN/creatinine:  Lab Results  Component Value Date   BUN 14 04/13/2014   CREATININE 0.9 04/13/2014  On Lisinopril - last set of lipids: Lab Results  Component Value Date   CHOL 136 04/13/2014   HDL 27.60* 04/13/2014   LDLCALC 89 04/13/2014   LDLDIRECT 98.9 05/17/2010   TRIG 98.0 04/13/2014   CHOLHDL 5 04/13/2014  On Pitavastatin. - last eye exam was in 04/2014. No DR. + glaucoma.  - no numbness and tingling in his feet.  Pt has FH of DM in mother, half brother.   ROS: Constitutional:+ weight loss, no fatigue, no subjective  hyperthermia/hypothermia; + nocturia, + excessive urination Eyes: no blurry vision, no xerophthalmia+  ENT: no sore throat, no nodules palpated in throat, no dysphagia/odynophagia, no hoarseness Cardiovascular: no CP/SOB/palpitations/leg swelling Respiratory: no cough/SOB Gastrointestinal: no N/V/D/+ C Musculoskeletal: no muscle/joint aches Skin: no rashes Neurological: no tremors/numbness/tingling/dizziness Psychiatric: no depression/anxiety  Past Medical History  Diagnosis Date  . Diabetes mellitus   . Hypertension   . Hyperlipidemia   . Chronic kidney disease   . Renal calculus or stone   . Glaucoma   . CAD (coronary artery disease)   . Skin cancer of nose     ears and hands  . CHICKENPOX, HX OF 08/30/2008    Qualifier: Diagnosis of  By: [name], [name]    . History of urinary calculi 08/30/2008    Qualifier: Diagnosis of  By: [name], Jacelyn Pi    Past Surgical History  Procedure Laterality Date  . Left knee replacement  2002/ 2004  . Tonsillectomy    . Lithotripsy      was cancelled/pt passed stone  . Laminectomy  1983/1988  . Basal cell carcinoma excision      hands  . Ankle surgery      2013/ wears boot cast   History   Social History  . Marital Status: Married    Spouse Name: N/A    Number  of Children: 2   Occupational History  .      Retired   Social History Main Topics  . Smoking status: Never Smoker   . Smokeless tobacco: Never Used  . Alcohol Use: No  . Drug Use: No   Current Outpatient Prescriptions on File Prior to Visit  Medication Sig Dispense Refill  . aspirin EC 325 MG tablet Take 1 tablet (325 mg total) by mouth daily. 30 tablet 0  . bimatoprost (LUMIGAN) 0.03 % ophthalmic solution Place 1 drop into both eyes at bedtime.    [name] CIALIS 20 MG tablet Take 1 tablet by mouth  daily as needed for   erectile dysfunction. 6 tablet 5  . HUMALOG MIX 75/25 KWIKPEN (75-25) 100 UNIT/ML Kwikpen     . HYDROmorphone (DILAUDID) 4 MG tablet Take 4 mg by  mouth every 4 (four) hours as needed. For kidney stone pain     . Insulin Detemir (LEVEMIR FLEXTOUCH) 100 UNIT/ML Pen Inject subcutaneously 31units twice a day    . insulin lispro (HUMALOG) 100 UNIT/ML KiwkPen 10 units before breakfast and lunch and dinner if sugar>100, or if sugar >150 before dinner, take 12 units. 15 mL 3  . lisinopril (PRINIVIL,ZESTRIL) 20 MG tablet Take 1 tablet (20 mg total) by mouth daily. 90 tablet 3  . metFORMIN (GLUCOPHAGE-XR) 500 MG 24 hr tablet Take 2 tablets (1,000 mg total) by mouth 2 (two) times daily. 360 tablet 3  . naproxen (NAPROSYN) 500 MG tablet Take 1 tablet by mouth  twice a day with a meal 180 tablet 2  . nitroGLYCERIN (NITRODUR - DOSED IN MG/24 HR) 0.2 mg/hr patch Place 1/4 of patch over affected region. Remove and replace once daily.  Slightly alter skin placement daily 30 patch 0  . NON FORMULARY Take 2 tablets by mouth daily. Diabetic vitamin packet 1 daily    . NON FORMULARY Alpha-lopic acid 200 mg-Take one daily    . Nutritional Supplements (PROSTATE ENZYME FORMULA PO) Take by mouth. Take 2 in pm    . ONE TOUCH ULTRA TEST test strip Use 3 times a day 300 each 5  . Pitavastatin Calcium (LIVALO) 2 MG TABS Twice a week 30 tablet 5  . predniSONE (DELTASONE) 5 MG tablet Take 5t po X3d, then 4t po X 3d, 3t po X 3d, 2T po x 3d, 1T po x3d 45 tablet 0  . tamsulosin (FLOMAX) 0.4 MG CAPS capsule      No current facility-administered medications on file prior to visit.   Allergies  Allergen Reactions  . Demerol Other (See Comments)    Blood pressure dropped completely out  . Meperidine Hcl Other (See Comments)    Drops blood pressure out  . Nitrospan [Nitroglycerin] Other (See Comments)    Blood pressure dropped out   Family History  Problem Relation Age of Onset  . Heart disease Brother     Atrial fibrillation  . Heart disease Father     CHF  . Heart disease Mother    PE: BP 126/70 mmHg  Pulse 90  Temp(Src) 97.9 F (36.6 C) (Oral)  Resp 12   Ht 6' 3.5" (1.918 m)  Wt 223 lb (101.152 kg)  BMI 27.50 kg/m2  SpO2 97% Wt Readings from Last 3 Encounters:  06/26/14 223 lb (101.152 kg)  06/20/14 228 lb (103.42 kg)  06/15/14 226 lb (102.513 kg)   Constitutional: Slightly overweight, in NAD Eyes: PERRLA, EOMI, no exophthalmos ENT: moist mucous membranes, no thyromegaly, no cervical lymphadenopathy  Cardiovascular: RRR, No MRG Respiratory: CTA B Gastrointestinal: abdomen soft, NT, ND, BS+ Musculoskeletal: no deformities, strength intact in all 4, right peri-ankle edema Skin: moist, warm, no rashes Neurological: no tremor with outstretched hands, DTR normal in all 4  ASSESSMENT: 1. DM2, non-insulin-dependent, uncontrolled, without complications  PLAN:  1. Patient with long-standing, fairly well controlled diabetes, on metformin and premixed insulin + basal insulin, with frequent lows and fluctuating sugars especially later in the day. He is now on a diet that allowed him to lose almost 20 pounds and he wants to lose 5 more.  - We discussed about options for treatment, and I suggested to at least try to eliminate his mealtime insulin and see if we can use glipizide to cover his meals. If glipizide is not enough, we can try a GLP-1 receptor agonist/DPP 4 antagonist/SGLT2 inhibitor, before retrying mealtime insulin. Patient Instructions  Please stop Humalog 75/25. Continue Levemir but change the dose to 30 units at bedtime. Start Glipizide 5 mg in am before b'fast and 5 mg before dinner. For larger meals, can increase to 10 mg.  Please send me a message in 1 week with your sugars.  Please return in 1 month with your sugar log.   - Continue checking sugars at different times of the day - he brings a great sugar log with 6 sugar checks a day, advised him that he can take less than that, 3-4 times a day, rotating checks - Discussed CBG optimum targets and also hemoglobin A1c target - given foot care handout and explained the principles   - given instructions for hypoglycemia management "15-15 rule"  - advised for yearly eye exams >> he is up-to-date - He is also up-to-date with his flu vaccine and pneumonia vaccine - Advised him to call me/send me a message through my chart in a week with his sugars - Return to clinic in 1 mo with sugar log

## 2014-06-26 NOTE — Patient Instructions (Signed)
Please stop Humalog 75/25. Continue Levemir but change the dose to 30 units at bedtime. Start Glipizide 5 mg in am before b'fast and 5 mg before dinner. For larger meals, can increase to 10 mg.  Please send me a message in 1 week with your sugars.  Please return in 1 month with your sugar log.   PATIENT INSTRUCTIONS FOR TYPE 2 DIABETES:  **Please join MyChart!** - see attached instructions about how to join if you have not done so already.  DIET AND EXERCISE Diet and exercise is an important part of diabetic treatment.  We recommended aerobic exercise in the form of brisk walking (working between 40-60% of maximal aerobic capacity, similar to brisk walking) for 150 minutes per week (such as 30 minutes five days per week) along with 3 times per week performing 'resistance' training (using various gauge rubber tubes with handles) 5-10 exercises involving the major muscle groups (upper body, lower body and core) performing 10-15 repetitions (or near fatigue) each exercise. Start at half the above goal but build slowly to reach the above goals. If limited by weight, joint pain, or disability, we recommend daily walking in a swimming pool with water up to waist to reduce pressure from joints while allow for adequate exercise.    BLOOD GLUCOSES Monitoring your blood glucoses is important for continued management of your diabetes. Please check your blood glucoses 2-4 times a day: fasting, before meals and at bedtime (you can rotate these measurements - e.g. one day check before the 3 meals, the next day check before 2 of the meals and before bedtime, etc.).   HYPOGLYCEMIA (low blood sugar) Hypoglycemia is usually a reaction to not eating, exercising, or taking too much insulin/ other diabetes drugs.  Symptoms include tremors, sweating, hunger, confusion, headache, etc. Treat IMMEDIATELY with 15 grams of Carbs: . 4 glucose tablets .  cup regular juice/soda . 2 tablespoons raisins . 4 teaspoons  sugar . 1 tablespoon honey Recheck blood glucose in 15 mins and repeat above if still symptomatic/blood glucose <100.  RECOMMENDATIONS TO REDUCE YOUR RISK OF DIABETIC COMPLICATIONS: * Take your prescribed MEDICATION(S) * Follow a DIABETIC diet: Complex carbs, fiber rich foods, (monounsaturated and polyunsaturated) fats * AVOID saturated/trans fats, high fat foods, >2,300 mg salt per day. * EXERCISE at least 5 times a week for 30 minutes or preferably daily.  * DO NOT SMOKE OR DRINK more than 1 drink a day. * Check your FEET every day. Do not wear tightfitting shoes. Contact us if you develop an ulcer * See your EYE doctor once a year or more if needed * Get a FLU shot once a year * Get a PNEUMONIA vaccine once before and once after age 70 years  GOALS:  * Your Hemoglobin A1c of <7%  * fasting sugars need to be <130 * after meals sugars need to be <180 (2h after you start eating) * Your Systolic BP should be 035 or lower  * Your Diastolic BP should be 80 or lower  * Your HDL (Good Cholesterol) should be 40 or higher  * Your LDL (Bad Cholesterol) should be 100 or lower. * Your Triglycerides should be 150 or lower  * Your Urine microalbumin (kidney function) should be <30 * Your Body Mass Index should be 25 or lower    Please consider the following ways to cut down carbs and fat and increase fiber and micronutrients in your diet: - substitute whole grain for white bread or pasta - substitute  brown rice for white rice - substitute 90-calorie flat bread pieces for slices of bread when possible - substitute sweet potatoes or yams for white potatoes - substitute humus for margarine - substitute tofu for cheese when possible - substitute almond or rice milk for regular milk (would not drink soy milk daily due to concern for soy estrogen influence on breast cancer risk) - substitute dark chocolate for other sweets when possible - substitute water - can add lemon or orange slices for taste  - for diet sodas (artificial sweeteners will trick your body that you can eat sweets without getting calories and will lead you to overeating and weight gain in the long run) - do not skip breakfast or other meals (this will slow down the metabolism and will result in more weight gain over time)  - can try smoothies made from fruit and almond/rice milk in am instead of regular breakfast - can also try old-fashioned (not instant) oatmeal made with almond/rice milk in am - order the dressing on the side when eating salad at a restaurant (pour less than half of the dressing on the salad) - eat as little meat as possible - can try juicing, but should not forget that juicing will get rid of the fiber, so would alternate with eating raw veg./fruits or drinking smoothies - use as little oil as possible, even when using olive oil - can dress a salad with a mix of balsamic vinegar and lemon juice, for e.g. - use agave nectar, stevia sugar, or regular sugar rather than artificial sweateners - steam or broil/roast veggies  - snack on veggies/fruit/nuts (unsalted, preferably) when possible, rather than processed foods - reduce or eliminate aspartame in diet (it is in diet sodas, chewing gum, etc) Read the labels!  Try to read Dr. Janene Harvey book: "Program for Reversing Diabetes" for other ideas for healthy eating.

## 2014-07-03 ENCOUNTER — Encounter: Payer: Self-pay | Admitting: Internal Medicine

## 2014-07-04 ENCOUNTER — Encounter: Payer: Self-pay | Admitting: Sports Medicine

## 2014-07-04 ENCOUNTER — Ambulatory Visit (INDEPENDENT_AMBULATORY_CARE_PROVIDER_SITE_OTHER): Payer: 59 | Admitting: Sports Medicine

## 2014-07-04 VITALS — BP 128/59 | Ht 76.0 in | Wt 211.0 lb

## 2014-07-04 DIAGNOSIS — M25571 Pain in right ankle and joints of right foot: Secondary | ICD-10-CM

## 2014-07-04 DIAGNOSIS — G8929 Other chronic pain: Secondary | ICD-10-CM

## 2014-07-04 DIAGNOSIS — R2689 Other abnormalities of gait and mobility: Secondary | ICD-10-CM

## 2014-07-04 DIAGNOSIS — R269 Unspecified abnormalities of gait and mobility: Secondary | ICD-10-CM

## 2014-07-04 NOTE — Assessment & Plan Note (Signed)
Cont to use comp sleeve as that has helped  Cont NTG protocol on post tib - rescan this in 8 wks  Wedges and insole added on RT to support lateral ankle

## 2014-07-04 NOTE — Progress Notes (Signed)
Patient ID: Nathaniel Thomas, male   DOB: Jul 31, 1951, 63 y.o.   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

## 2014-07-04 NOTE — Assessment & Plan Note (Signed)
Walking with less pain  Still with rear foot varus on RT Trendelenburg to left  Lift added to left  These changes helped but I think we need to try to see if [facility] can mold a special orthotic support to stabiize RT foot and ankle  Referred for that

## 2014-07-04 NOTE — Patient Instructions (Signed)
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

## 2014-07-12 ENCOUNTER — Other Ambulatory Visit: Payer: Self-pay | Admitting: *Deleted

## 2014-07-12 MED ORDER — GLIPIZIDE 5 MG PO TABS: 5.0000 mg | ORAL_TABLET | Freq: Two times a day (BID) | ORAL | Status: DC

## 2014-07-26 ENCOUNTER — Ambulatory Visit (INDEPENDENT_AMBULATORY_CARE_PROVIDER_SITE_OTHER): Payer: 59 | Admitting: Family Medicine

## 2014-07-26 ENCOUNTER — Encounter: Payer: Self-pay | Admitting: Family Medicine

## 2014-07-26 VITALS — BP 128/82 | HR 89 | Temp 97.8°F | Wt 212.0 lb

## 2014-07-26 DIAGNOSIS — N400 Enlarged prostate without lower urinary tract symptoms: Secondary | ICD-10-CM | POA: Insufficient documentation

## 2014-07-26 DIAGNOSIS — I10 Essential (primary) hypertension: Secondary | ICD-10-CM

## 2014-07-26 DIAGNOSIS — R21 Rash and other nonspecific skin eruption: Secondary | ICD-10-CM

## 2014-07-26 NOTE — Patient Instructions (Signed)
Let's keep an eye on blood pressure given your weight loss, may need to eventually go down to 10mg  lisinopril again  [place] job on weight loss!  Thrilled no more low blood sugars. Hope you have a great visit and good a1c report with Dr. Renne Crigler next week.

## 2014-07-26 NOTE — Progress Notes (Signed)
[name], MD Phone: [phone]  Subjective:   Nathaniel Thomas is a 63 y.o. year old very pleasant male patient who presents with the following:  Hypertension-controlled on lisinopril 20mg   BP Readings from Last 3 Encounters:  07/26/14 128/82  07/04/14 128/59  06/26/14 126/70  Home BP monitoring-no Compliant with medications-yes without side effects ROS-Denies any CP, HA, SOB, blurry vision, LE edema.   Rash-new Started about a week ago medial ankle near where he was placing patches. Wants to hold off on nitroglycerin and stopped a few days ago. Also stopped sleeve. Seems to be getting better.  ROS-not ill appearing, no fever/chills. No new medications other than nitro and sleeve used. Not immunocompromised. No mucus membrane involvement.   Past Medical History- Patient Active Problem List   Diagnosis Date Noted  . CAD (coronary artery disease) 03/12/2011    Priority: High  . Diabetes mellitus type 2, uncontrolled, without complications 72/82/0601    Priority: High  . BPH (benign prostatic hyperplasia) 07/26/2014    Priority: Medium  . 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 HYPERLIPIDEMIA, WITH LOW HDL 05/02/2009    Priority: Medium  . HYPERTENSION, BORDERLINE 03/27/2009    Priority: Medium  . Chronic ankle pain 04/19/2014    Priority: Low  . Chest pain 06/15/2013    Priority: Low  . OSTEOARTHRITIS, CARPOMETACARPAL JOINT, RIGHT THUMB 09/04/2009    Priority: Low  . GERD 08/30/2008    Priority: Low  . ARTHRITIS 08/30/2008    Priority: Low  . Antalgic gait 05/19/2014   Medications- reviewed and updated Current Outpatient Prescriptions  Medication Sig Dispense Refill  . aspirin EC 325 MG tablet Take 1 tablet (325 mg total) by mouth daily. 30 tablet 0  . bimatoprost (LUMIGAN) 0.03 % ophthalmic solution Place 1 drop into both eyes at bedtime.    [name] CIALIS 20 MG tablet Take 1 tablet by mouth  daily as needed for   erectile dysfunction. 6  tablet 5  . glipiZIDE (GLUCOTROL) 5 MG tablet Take 1 tablet (5 mg total) by mouth 2 (two) times daily before a meal. 180 tablet 1  . HYDROmorphone (DILAUDID) 4 MG tablet Take 4 mg by mouth every 4 (four) hours as needed. For kidney stone pain     . Insulin Detemir (LEVEMIR FLEXTOUCH) 100 UNIT/ML Pen 30 Units.    [name] lisinopril (PRINIVIL,ZESTRIL) 20 MG tablet Take 1 tablet (20 mg total) by mouth daily. 90 tablet 3  . metFORMIN (GLUCOPHAGE-XR) 500 MG 24 hr tablet Take 2 tablets (1,000 mg total) by mouth 2 (two) times daily. 360 tablet 3  . naproxen (NAPROSYN) 500 MG tablet Take 1 tablet by mouth  twice a day with a meal 180 tablet 2  . NON FORMULARY Take 2 tablets by mouth daily. Diabetic vitamin packet 1 daily    . NON FORMULARY Alpha-lopic acid 200 mg-Take one daily    . Nutritional Supplements (PROSTATE ENZYME FORMULA PO) Take by mouth. Take 2 in pm    . ONE TOUCH ULTRA TEST test strip Use 3 times a day (Patient taking differently: Use 6 times a day) 300 each 5  . Pitavastatin Calcium (LIVALO) 2 MG TABS Twice a week 30 tablet 5  . tamsulosin (FLOMAX) 0.4 MG CAPS capsule     . nitroGLYCERIN (NITRODUR - DOSED IN MG/24 HR) 0.2 mg/hr patch Place 1/4 of patch over affected region. Remove and replace once daily.  Slightly alter skin placement daily (Patient not taking: Reported on  07/26/2014) 30 patch 0   No current facility-administered medications for this visit.    Objective: BP 128/82 mmHg  Pulse 89  Temp(Src) 97.8 F (36.6 C)  Wt 212 lb (96.163 kg) Gen: NAD, resting comfortably CV: RRR no murmurs rubs or gallops, every 30-45 seconds or so ectopic beat Lungs: CTAB no crackles, wheeze, rhonchi Abdomen: soft/nontender/nondistended/normal bowel sounds.  Ext: no edema Skin: warm, dry Right medial ankle near where he placed nitro patches has erythematous blanching non scaling rash.    Assessment/Plan:  Essential hypertension Controlled/continue Lisinopril 20 mg  Rash In area near  nitroglycerin patch placement-patient has discontinued and seems to be improving. He is hoping for orthotics for his ankle.   Return precautions advised. 6 month follow up planned but will see endocrinology more frequently than that. Patient is thrilled with his diabetes progress. i congratulated him on weight loss.

## 2014-07-26 NOTE — Assessment & Plan Note (Signed)
Controlled/continue Lisinopril 20 mg

## 2014-07-31 ENCOUNTER — Encounter: Payer: Self-pay | Admitting: Internal Medicine

## 2014-07-31 ENCOUNTER — Ambulatory Visit (INDEPENDENT_AMBULATORY_CARE_PROVIDER_SITE_OTHER): Payer: 59 | Admitting: Internal Medicine

## 2014-07-31 VITALS — BP 114/70 | HR 90 | Temp 98.1°F | Resp 12 | Wt 214.0 lb

## 2014-07-31 DIAGNOSIS — E119 Type 2 diabetes mellitus without complications: Secondary | ICD-10-CM

## 2014-07-31 LAB — HEMOGLOBIN A1C: Hgb A1c MFr Bld: 7.2 % — ABNORMAL HIGH (ref 4.6–6.5)

## 2014-07-31 NOTE — Progress Notes (Signed)
Patient ID: Nathaniel Thomas, male   DOB: 10/05/1950, 63 y.o.   MRN: [id number]  HPI: Nathaniel Thomas is a 63 y.o.-year-old male, returning for f/u for DM2, dx 1999, insulin-dependent since 2008, uncontrolled, without complications.  Last hemoglobin A1c was: Lab Results  Component Value Date   HGBA1C 7.6* 04/13/2014   HGBA1C 6.7* 11/11/2013   HGBA1C 7.1* 06/20/2013  He had a steroid inj and Prednisone taper last week.   Pt is on a regimen of: - Metformin XR 1000 mg po bid - Glipizide 5 mg in am before b'fast and 5 mg before dinner. For larger meals, can increase to 10 mg. - Levemir 25-30 units at bedtime We stopped Humalog 75/25 15 in am, 10-20 at lunch, 8-20 at dinner  Pt checks his sugars 3-4 x a day and they are slightly higher as the day goes by. He still has lows at night: - am: 56, 86-143 >> 73, 82-138  - 2h after b'fast: 61, 80-132 >> 122, 154 - before lunch: 87-160, 210 >> 80-142, 155 - 2h after lunch: 67, 104-175, 240 >> 176 - before dinner: 136-204, 240 >> 94, 122-151, 204 - 2h after dinner: 107-200, 279 >> 83-185, 231 (grapes), 236 - nighttime: 49-59 No lows. Lowest sugar was 49; he has hypoglycemia awareness at 60.  Highest sugar was 236.  Ptats 6x a day, drinks 8 glasses of water; 4 servings of veggies a day. Pt's meals are: - Breakfast: [place] bacon, 1-2 eggs, butter; protein shake 3x a week; toast - Lunch: salad - large; rotisserie chicken or cheese - Dinner: chicken chilli, brown rice - Snacks: fruits, fruit cake; nuts; black bean chips  He exercises 2x times a week by doing cardio and lifting weights.  - no CKD, last BUN/creatinine:  Lab Results  Component Value Date   BUN 14 04/13/2014   CREATININE 0.9 04/13/2014  On Lisinopril - last set of lipids: Lab Results  Component Value Date   CHOL 136 04/13/2014   HDL 27.60* 04/13/2014   LDLCALC 89 04/13/2014   LDLDIRECT 98.9 05/17/2010   TRIG 98.0 04/13/2014   CHOLHDL 5 04/13/2014  On Pitavastatin. - last  eye exam was in 04/2014. No DR. + glaucoma.  - no numbness and tingling in his feet.  ROS: Constitutional: + weight loss, no fatigue, no subjective hyperthermia/hypothermia; + nocturia Eyes: no blurry vision, no xerophthalmia+  ENT: no sore throat, no nodules palpated in throat, no dysphagia/odynophagia, no hoarseness Cardiovascular: no CP/SOB/palpitations/leg swelling Respiratory:+cough/no SOB Gastrointestinal: no N/V/D/+ C Musculoskeletal: no muscle/joint aches Skin: no rashes Neurological: no tremors/numbness/tingling/dizziness Psychiatric: no depression/anxiety  I reviewed pt's medications, allergies, PMH, social hx, family hx, and changes were documented in the history of present illness. Otherwise, unchanged from my initial visit note.  Past Medical History  Diagnosis Date  . Diabetes mellitus   . Hypertension   . Hyperlipidemia   . Chronic kidney disease   . Renal calculus or stone   . Glaucoma   . CAD (coronary artery disease)   . Skin cancer of nose     ears and hands  . CHICKENPOX, HX OF 08/30/2008    Qualifier: Diagnosis of  By: [name], [name]    . History of urinary calculi 08/30/2008    Qualifier: Diagnosis of  By: [name], Jacelyn Pi    Past Surgical History  Procedure Laterality Date  . Left knee replacement  2002/ 2004  . Tonsillectomy    . Lithotripsy  was cancelled/pt passed stone  . Laminectomy  1983/1988  . Basal cell carcinoma excision      hands  . Ankle surgery      2013/ wears boot cast   History   Social History  . Marital Status: Married    Spouse Name: N/A    Number of Children: 2   Occupational History  .      Retired   Social History Main Topics  . Smoking status: Never Smoker   . Smokeless tobacco: Never Used  . Alcohol Use: No  . Drug Use: No   Current Outpatient Prescriptions on File Prior to Visit  Medication Sig Dispense Refill  . aspirin EC 325 MG tablet Take 1 tablet (325 mg total) by mouth daily. 30 tablet 0  .  bimatoprost (LUMIGAN) 0.03 % ophthalmic solution Place 1 drop into both eyes at bedtime.    [name] CIALIS 20 MG tablet Take 1 tablet by mouth  daily as needed for   erectile dysfunction. 6 tablet 5  . glipiZIDE (GLUCOTROL) 5 MG tablet Take 1 tablet (5 mg total) by mouth 2 (two) times daily before a meal. 180 tablet 1  . HYDROmorphone (DILAUDID) 4 MG tablet Take 4 mg by mouth every 4 (four) hours as needed. For kidney stone pain     . Insulin Detemir (LEVEMIR FLEXTOUCH) 100 UNIT/ML Pen 30 Units.    [name] lisinopril (PRINIVIL,ZESTRIL) 20 MG tablet Take 1 tablet (20 mg total) by mouth daily. 90 tablet 3  . metFORMIN (GLUCOPHAGE-XR) 500 MG 24 hr tablet Take 2 tablets (1,000 mg total) by mouth 2 (two) times daily. 360 tablet 3  . naproxen (NAPROSYN) 500 MG tablet Take 1 tablet by mouth  twice a day with a meal 180 tablet 2  . NON FORMULARY Take 2 tablets by mouth daily. Diabetic vitamin packet 1 daily    . NON FORMULARY Alpha-lopic acid 200 mg-Take one daily    . Nutritional Supplements (PROSTATE ENZYME FORMULA PO) Take by mouth. Take 2 in pm    . ONE TOUCH ULTRA TEST test strip Use 3 times a day (Patient taking differently: Use 6 times a day) 300 each 5  . Pitavastatin Calcium (LIVALO) 2 MG TABS Twice a week 30 tablet 5  . tamsulosin (FLOMAX) 0.4 MG CAPS capsule      No current facility-administered medications on file prior to visit.   Allergies  Allergen Reactions  . Demerol Other (See Comments)    Blood pressure dropped completely out  . Meperidine Hcl Other (See Comments)    Drops blood pressure out  . Nitrospan [Nitroglycerin] Other (See Comments)    Blood pressure dropped out   Family History  Problem Relation Age of Onset  . Heart disease Brother     Atrial fibrillation  . Heart disease Father     CHF  . Heart disease Mother    PE: BP 114/70 mmHg  Pulse 90  Temp(Src) 98.1 F (36.7 C) (Oral)  Resp 12  Wt 214 lb (97.07 kg)  SpO2 97% Wt Readings from Last 3 Encounters:  07/31/14 214  lb (97.07 kg)  07/26/14 212 lb (96.163 kg)  07/04/14 211 lb (95.709 kg)   Constitutional: Slightly overweight, in NAD Eyes: PERRLA, EOMI, no exophthalmos ENT: moist mucous membranes, no thyromegaly, no cervical lymphadenopathy Cardiovascular: RRR, No MRG Respiratory: CTA B Gastrointestinal: abdomen soft, NT, ND, BS+ Musculoskeletal: no deformities, strength intact in all 4, right peri-ankle edema Skin: moist, warm, no rashes Neurological: no  tremor with outstretched hands, DTR normal in all 4  ASSESSMENT: 1. DM2, non-insulin-dependent, uncontrolled, without complications  PLAN:  1. Patient with long-standing, fairly well controlled diabetes, on metformin, Glipizide, and Levemir 25-30 units. He has lows at night >> will move Levemir in am. These lows can happen even in the days that he does not exercise before dinner - We discussed about options for treatment, and Iwe can try a GLP-1 receptor agonist/DPP 4 antagonist/SGLT2 inhibitor, before retrying mealtime insulin, if we need to in the future  Patient Instructions  Please move Levemir 25-30 units in am Continue Glipizide 5 mg in am before b'fast and 5 mg before dinner. For larger meals, can increase to 10 mg. Continue Metformin XR 1000 mg 2x a day  Please return in 1.5 month with your sugar log.   Please stop at the lab.  - Continue checking sugars at different times of the day - he brings a great sugar log with 6 sugar checks a day, advised him that he can take less than that, 3-4 times a day, rotating checks - advised for yearly eye exams >> he is up-to-date - He is also up-to-date with his flu vaccine and pneumonia vaccine - check HbA1c today - Return to clinic in 1.5 mo with sugar log   Office Visit on 07/31/2014  Component Date Value Ref Range Status  . Hgb A1c MFr Bld 07/31/2014 7.2* 4.6 - 6.5 % Final   Glycemic Control Guidelines for People with Diabetes:Non Diabetic:  <6%Goal of Therapy: <7%Additional Action Suggested:   >8%    HbA1c is better!

## 2014-07-31 NOTE — Patient Instructions (Addendum)
Patient Instructions  Please move Levemir 25-30 units in am Continue Glipizide 5 mg in am before b'fast and 5 mg before dinner. For larger meals, can increase to 10 mg. Continue Metformin XR 1000 mg 2x a day  Please return in 1.5 month with your sugar log.   Please stop at the lab.

## 2014-08-22 ENCOUNTER — Telehealth: Payer: Self-pay | Admitting: Internal Medicine

## 2014-08-22 NOTE — Telephone Encounter (Signed)
Please increase the Glipizide to 10 mg 2x a day if no lows and let me know Re: sugars in 2-3 days. The infection itself can increase the sugars, not the Abx.

## 2014-08-22 NOTE — Telephone Encounter (Signed)
Patient stated that he has ans infection in his jaw and the doctor put him on clindamycin, his blood sugar has been running high, he would like to know if the med is the cause of it. Please advise

## 2014-08-22 NOTE — Telephone Encounter (Signed)
Called pt and lvm advising him per Dr Arman Filter note. Advised pt to call with any further questions.

## 2014-08-22 NOTE — Telephone Encounter (Signed)
Please read note below and advise.  

## 2014-09-03 ENCOUNTER — Encounter: Payer: Self-pay | Admitting: Internal Medicine

## 2014-09-11 ENCOUNTER — Ambulatory Visit (INDEPENDENT_AMBULATORY_CARE_PROVIDER_SITE_OTHER): Payer: 59 | Admitting: Internal Medicine

## 2014-09-11 ENCOUNTER — Encounter: Payer: Self-pay | Admitting: Internal Medicine

## 2014-09-11 VITALS — BP 122/68 | HR 72 | Temp 97.6°F | Resp 12 | Wt 214.0 lb

## 2014-09-11 DIAGNOSIS — E1165 Type 2 diabetes mellitus with hyperglycemia: Secondary | ICD-10-CM

## 2014-09-11 DIAGNOSIS — IMO0001 Reserved for inherently not codable concepts without codable children: Secondary | ICD-10-CM

## 2014-09-11 MED ORDER — GLIPIZIDE 5 MG PO TABS: 10.0000 mg | ORAL_TABLET | Freq: Two times a day (BID) | ORAL | Status: DC

## 2014-09-11 NOTE — Progress Notes (Signed)
Patient ID: Nathaniel Thomas, male   DOB: 1950-11-17, 64 y.o.   MRN: [id number]  HPI: Nathaniel Thomas is a 64 y.o.-year-old male, returning for f/u for DM2, dx 1999, insulin-dependent since 2008, uncontrolled, without complications. Last visit 1.5 mo ago.  He was on 3x ABx for tooth infection.  Last hemoglobin A1c was: Lab Results  Component Value Date   HGBA1C 7.2* 07/31/2014   HGBA1C 7.6* 04/13/2014   HGBA1C 6.7* 11/11/2013  He had a steroid inj and Prednisone taper in 07/2014.[name]   Pt is on a regimen of: - Metformin XR 1000 mg po bid - Glipizide 5 >> 10 mg in am before b'fast and 5 >> 10 mg before dinner. For larger meals, can increase to 10 mg. - Levemir 25-30 units at bedtime >> am b/c lows at night >> 15 units 2x a day We stopped Humalog 75/25 15 in am, 10-20 at lunch, 8-20 at dinner  Pt checks his sugars 3-4 x a day. No more lows at night, but sugars higher in am. They are better after splitting Levemir.: - am: 56, 86-143 >> 73, 82-138 >> 134-185, 204 - 2h after b'fast: 61, 80-132 >> 122, 154 >> 149-211 - before lunch: 87-160, 210 >> 80-142, 155 >> 75-160, 196 - 2h after lunch: 67, 104-175, 240 >> 176 >> 88-164, 213 - before dinner: 136-204, 240 >> 94, 122-151, 204 >> 103-197, 225 - 2h after dinner: 107-200, 279 >> 83-185, 231 (grapes), 236 >> 94-193 - nighttime: 49-59 >> 101-167, 195 No lows. Lowest sugar was 75; he has hypoglycemia awareness at 60.  Highest sugar was 236 >> 225.  Ptats 6x a day, drinks 8 glasses of water; 4 servings of veggies a day. Pt's meals are: - Breakfast: [place] bacon, 1-2 eggs, butter; protein shake 3x a week; toast - Lunch: salad - large; rotisserie chicken or cheese - Dinner: chicken chilli, brown rice - Snacks: fruits, fruit cake; nuts; black bean chips  He exercises 2x times a week by doing cardio and lifting weights.  - no CKD, last BUN/creatinine:  Lab Results  Component Value Date   BUN 14 04/13/2014   CREATININE 0.9 04/13/2014  On  Lisinopril - last set of lipids: Lab Results  Component Value Date   CHOL 136 04/13/2014   HDL 27.60* 04/13/2014   LDLCALC 89 04/13/2014   LDLDIRECT 98.9 05/17/2010   TRIG 98.0 04/13/2014   CHOLHDL 5 04/13/2014  On Pitavastatin. - last eye exam was in 04/2014. No DR. + glaucoma.  - no numbness and tingling in his feet.  ROS: Constitutional: no weight gain/loss, no fatigue, no subjective hyperthermia/hypothermia; + nocturia Eyes: no blurry vision, no xerophthalmia+  ENT: no sore throat, no nodules palpated in throat, no dysphagia/odynophagia, no hoarseness Cardiovascular: no CP/SOB/palpitations/leg swelling Respiratory:no cough/no SOB Gastrointestinal: no N/V/D/C Musculoskeletal: no muscle/joint aches Skin: no rashes Neurological: no tremors/numbness/tingling/dizziness  I reviewed pt's medications, allergies, PMH, social hx, family hx, and changes were documented in the history of present illness. Otherwise, unchanged from my initial visit note:  Past Medical History  Diagnosis Date  . Diabetes mellitus   . Hypertension   . Hyperlipidemia   . Chronic kidney disease   . Renal calculus or stone   . Glaucoma   . CAD (coronary artery disease)   . Skin cancer of nose     ears and hands  . CHICKENPOX, HX OF 08/30/2008    Qualifier: Diagnosis of  By: [name], [name]    . History  of urinary calculi 08/30/2008    Qualifier: Diagnosis of  By: [name], Jacelyn Pi    Past Surgical History  Procedure Laterality Date  . Left knee replacement  2002/ 2004  . Tonsillectomy    . Lithotripsy      was cancelled/pt passed stone  . Laminectomy  1983/1988  . Basal cell carcinoma excision      hands  . Ankle surgery      2013/ wears boot cast   History   Social History  . Marital Status: Married    Spouse Name: N/A    Number of Children: 2   Occupational History  .      Retired   Social History Main Topics  . Smoking status: Never Smoker   . Smokeless tobacco: Never Used  .  Alcohol Use: No  . Drug Use: No   Current Outpatient Prescriptions on File Prior to Visit  Medication Sig Dispense Refill  . aspirin EC 325 MG tablet Take 1 tablet (325 mg total) by mouth daily. 30 tablet 0  . bimatoprost (LUMIGAN) 0.03 % ophthalmic solution Place 1 drop into both eyes at bedtime.    [name] CIALIS 20 MG tablet Take 1 tablet by mouth  daily as needed for   erectile dysfunction. 6 tablet 5  . glipiZIDE (GLUCOTROL) 5 MG tablet Take 1 tablet (5 mg total) by mouth 2 (two) times daily before a meal. (Patient taking differently: Take 10 mg by mouth 2 (two) times daily before a meal. ) 180 tablet 1  . HYDROmorphone (DILAUDID) 4 MG tablet Take 4 mg by mouth every 4 (four) hours as needed. For kidney stone pain     . Insulin Detemir (LEVEMIR FLEXTOUCH) 100 UNIT/ML Pen 30 Units.    [name] lisinopril (PRINIVIL,ZESTRIL) 20 MG tablet Take 1 tablet (20 mg total) by mouth daily. 90 tablet 3  . metFORMIN (GLUCOPHAGE-XR) 500 MG 24 hr tablet Take 2 tablets (1,000 mg total) by mouth 2 (two) times daily. 360 tablet 3  . naproxen (NAPROSYN) 500 MG tablet Take 1 tablet by mouth  twice a day with a meal 180 tablet 2  . NON FORMULARY Take 2 tablets by mouth daily. Diabetic vitamin packet 1 daily    . NON FORMULARY Alpha-lopic acid 200 mg-Take one daily    . Nutritional Supplements (PROSTATE ENZYME FORMULA PO) Take by mouth. Take 2 in pm    . ONE TOUCH ULTRA TEST test strip Use 3 times a day (Patient taking differently: Use 6 times a day) 300 each 5  . Pitavastatin Calcium (LIVALO) 2 MG TABS Twice a week 30 tablet 5  . tamsulosin (FLOMAX) 0.4 MG CAPS capsule      No current facility-administered medications on file prior to visit.   Allergies  Allergen Reactions  . Demerol Other (See Comments)    Blood pressure dropped completely out  . Meperidine Hcl Other (See Comments)    Drops blood pressure out  . Nitrospan [Nitroglycerin] Other (See Comments)    Blood pressure dropped out   Family History   Problem Relation Age of Onset  . Heart disease Brother     Atrial fibrillation  . Heart disease Father     CHF  . Heart disease Mother    PE: BP 122/68 mmHg  Pulse 72  Temp(Src) 97.6 F (36.4 C) (Oral)  Resp 12  Wt 214 lb (97.07 kg)  SpO2 95% Wt Readings from Last 3 Encounters:  09/11/14 214 lb (97.07 kg)  07/31/14 214 lb (97.07 kg)  07/26/14 212 lb (96.163 kg)   Constitutional: Slightly overweight, in NAD Eyes: PERRLA, EOMI, no exophthalmos ENT: moist mucous membranes, no thyromegaly, no cervical lymphadenopathy Cardiovascular: RRR, No MRG Respiratory: CTA B Gastrointestinal: abdomen soft, NT, ND, BS+ Musculoskeletal: no deformities, strength intact in all 4, right peri-ankle edema Skin: moist, warm, no rashes Neurological: no tremor with outstretched hands, DTR normal in all 4  ASSESSMENT: 1. DM2, non-insulin-dependent, uncontrolled, without complications  PLAN:  1. Patient with long-standing, fairly well controlled diabetes, on metformin, Glipizide, and basal insulin. He had lows at night >> moved Levemir in am >> then split it. No more lows, but sugars in am are above target >> increase Levemir to 20 units at night. Reviewed last HbA1c >> very close to goal, at 7.2%. Patient Instructions  Please increase:  Levemir dose: 15 units in am and 20 units at bedtime.  Continue: Glipizide 10 mg in am before b'fast and 10 mg before dinner.  Metformin XR 1000 mg 2x a day  Please return in 3 months with your sugar log.   - Continue checking sugars at different times of the day - 3-4 times a day, rotating checks - advised for yearly eye exams >> he is up-to-date - He is also up-to-date with his flu vaccine and pneumonia vaccine - check HbA1c at next visit - refilled Glipizide - Return to clinic in 3 mo with sugar log

## 2014-09-11 NOTE — Patient Instructions (Addendum)
Please increase:  Levemir dose: 15 units in am and 20 units at bedtime.  Continue: Glipizide 10 mg in am before b'fast and 10 mg before dinner.  Metformin XR 1000 mg 2x a day  Please return in 3 months with your sugar log.

## 2014-09-14 ENCOUNTER — Other Ambulatory Visit: Payer: Self-pay

## 2014-09-14 NOTE — Telephone Encounter (Signed)
Rx request for Levemir Flextouch 5x3ML- Inject subcutaneoulsy 2o units two times daily after a meal  3*15 ml= 45 ML   Pharm:  [facility]

## 2014-09-14 NOTE — Telephone Encounter (Signed)
This was sent to me by mistake, we dont handle her insulin.

## 2014-09-15 ENCOUNTER — Telehealth: Payer: Self-pay | Admitting: Internal Medicine

## 2014-09-15 MED ORDER — GLIPIZIDE 5 MG PO TABS: 10.0000 mg | ORAL_TABLET | Freq: Two times a day (BID) | ORAL | Status: DC

## 2014-09-15 MED ORDER — INSULIN DETEMIR 100 UNIT/ML FLEXPEN: PEN_INJECTOR | SUBCUTANEOUS | Status: DC

## 2014-09-15 NOTE — Telephone Encounter (Signed)
Patient stated that he will be out of his Glipizide in two days, he will not have anymore meds until he receive it from [facility] by mail. Please advise

## 2014-09-15 NOTE — Telephone Encounter (Signed)
Rx sent 

## 2014-09-15 NOTE — Telephone Encounter (Signed)
Spoke to pt. Pt stated that [facility] has the refill but they sent him a notification that the medication is shipping [facility] and there is a delay in delivery. They did not send a new expected ship date.   Spoke to MD.  Dr. Cruzita Lederer gave verbal okay for 30d supply of glipizide could be sent into local pharmacy.   erx done. Pt notified.

## 2014-11-07 ENCOUNTER — Other Ambulatory Visit: Payer: Self-pay | Admitting: *Deleted

## 2014-11-07 MED ORDER — GLUCOSE BLOOD VI STRP: ORAL_STRIP | Status: DC

## 2014-11-24 DIAGNOSIS — N200 Calculus of kidney: Secondary | ICD-10-CM | POA: Diagnosis not present

## 2014-11-24 DIAGNOSIS — N3000 Acute cystitis without hematuria: Secondary | ICD-10-CM | POA: Diagnosis not present

## 2014-11-26 ENCOUNTER — Encounter: Payer: Self-pay | Admitting: Internal Medicine

## 2014-12-01 ENCOUNTER — Encounter: Payer: Self-pay | Admitting: Internal Medicine

## 2014-12-06 ENCOUNTER — Encounter: Payer: Self-pay | Admitting: Internal Medicine

## 2014-12-11 ENCOUNTER — Encounter: Payer: Self-pay | Admitting: Internal Medicine

## 2014-12-11 ENCOUNTER — Ambulatory Visit (INDEPENDENT_AMBULATORY_CARE_PROVIDER_SITE_OTHER): Payer: Medicare Other | Admitting: Internal Medicine

## 2014-12-11 VITALS — BP 128/64 | HR 70 | Temp 97.7°F | Resp 12 | Wt 208.0 lb

## 2014-12-11 DIAGNOSIS — E1165 Type 2 diabetes mellitus with hyperglycemia: Secondary | ICD-10-CM

## 2014-12-11 DIAGNOSIS — IMO0001 Reserved for inherently not codable concepts without codable children: Secondary | ICD-10-CM

## 2014-12-11 MED ORDER — CANAGLIFLOZIN 100 MG PO TABS: 100.0000 mg | ORAL_TABLET | Freq: Every day | ORAL | Status: DC

## 2014-12-11 NOTE — Progress Notes (Signed)
Patient ID: Nathaniel Thomas, male   DOB: [birth year], 64 y.o.   MRN: [id number]  HPI: Nathaniel Thomas is a 64 y.o.-year-old male, returning for f/u for DM2, dx 1999, insulin-dependent since 2008, uncontrolled, without complications. Last visit 3 mo ago.  Last hemoglobin A1c was: Lab Results  Component Value Date   HGBA1C 7.2* 07/31/2014   HGBA1C 7.6* 04/13/2014   HGBA1C 6.7* 11/11/2013  He had a steroid inj and Prednisone taper in 07/2014.[name]   Pt is on a regimen of: - Metformin XR 1000 mg po bid - Glipizide 10 mg in am before b'fast and 10 mg before dinner.  - Levemir 15 units in am and 20 units in pm We stopped Humalog 75/25 15 in am, 10-20 at lunch, 8-20 at dinner  Pt checks his sugars 3-4 x a day. He had high sugars >> we need to increase Glipizide to 20 mg bid as a temporizing measure. Sugars better in last 10 days after the increase: - am: 56, 86-143 >> 73, 82-138 >> 134-185, 204 >> 109-160, 172 - 2h after b'fast: 61, 80-132 >> 122, 154 >> 149-211 >> 132-168 lately, prev up to 289 - before lunch: 87-160, 210 >> 80-142, 155 >> 75-160, 196 >> 72-154 lately - 2h after lunch: 67, 104-175, 240 >> 176 >> 88-164, 213 >> 141-194, 282 - before dinner: 136-204, 240 >> 94, 122-151, 204 >> 103-197, 225 >> 130-257 - 2h after dinner: 107-200, 279 >> 83-185, 231 (grapes), 236 >> 94-193 >> 56, 212-334 - nighttime: 49-59 >> 101-167, 195  No lows. Lowest sugar was 75; he has hypoglycemia awareness at 60.  Highest sugar was 236 >> 225.  Ptats 6x a day, drinks 8 glasses of water; 4 servings of veggies a day. Pt's meals are: - Breakfast: [place] bacon, 1-2 eggs, butter; protein shake 3x a week; toast - Lunch: salad - large; rotisserie chicken or cheese - Dinner: chicken chilli, brown rice - Snacks: fruits, fruit cake; nuts; black bean chips  He exercises 2x times a week by doing cardio and lifting weights.  - no CKD, last BUN/creatinine:  Lab Results  Component Value Date   BUN 14 04/13/2014   CREATININE 0.9 04/13/2014  On Lisinopril - last set of lipids: Lab Results  Component Value Date   CHOL 136 04/13/2014   HDL 27.60* 04/13/2014   LDLCALC 89 04/13/2014   LDLDIRECT 98.9 05/17/2010   TRIG 98.0 04/13/2014   CHOLHDL 5 04/13/2014  On Pitavastatin. - last eye exam was in 04/2014. No DR. + glaucoma.  - no numbness and tingling in his feet.  ROS: Constitutional: no weight gain/loss, no fatigue, no subjective hyperthermia/hypothermia; + nocturia x2 Eyes: no blurry vision, no xerophthalmia ENT: no sore throat, no nodules palpated in throat, no dysphagia/odynophagia, no hoarseness Cardiovascular: no CP/SOB/palpitations/leg swelling Respiratory:no cough/no SOB Gastrointestinal: no N/V/D/C Musculoskeletal: no muscle/joint aches Skin: no rashes Neurological: no tremors/numbness/tingling/dizziness  I reviewed pt's medications, allergies, PMH, social hx, family hx, and changes were documented in the history of present illness. Otherwise, unchanged from my initial visit note:  Past Medical History  Diagnosis Date  . Diabetes mellitus   . Hypertension   . Hyperlipidemia   . Chronic kidney disease   . Renal calculus or stone   . Glaucoma   . CAD (coronary artery disease)   . Skin cancer of nose     ears and hands  . CHICKENPOX, HX OF 08/30/2008    Qualifier: Diagnosis of  By: [name], [name]    .  History of urinary calculi 08/30/2008    Qualifier: Diagnosis of  By: [name], Jacelyn Pi    Past Surgical History  Procedure Laterality Date  . Left knee replacement  2002/ 2004  . Tonsillectomy    . Lithotripsy      was cancelled/pt passed stone  . Laminectomy  1983/1988  . Basal cell carcinoma excision      hands  . Ankle surgery      2013/ wears boot cast   History   Social History  . Marital Status: Married    Spouse Name: N/A    Number of Children: 2   Occupational History  .      Retired   Social History Main Topics  . Smoking status: Never Smoker   .  Smokeless tobacco: Never Used  . Alcohol Use: No  . Drug Use: No   Current Outpatient Prescriptions on File Prior to Visit  Medication Sig Dispense Refill  . aspirin EC 325 MG tablet Take 1 tablet (325 mg total) by mouth daily. 30 tablet 0  . bimatoprost (LUMIGAN) 0.03 % ophthalmic solution Place 1 drop into both eyes at bedtime.    [name] CIALIS 20 MG tablet Take 1 tablet by mouth  daily as needed for   erectile dysfunction. 6 tablet 5  . glipiZIDE (GLUCOTROL) 5 MG tablet Take 2 tablets (10 mg total) by mouth 2 (two) times daily before a meal. 120 tablet 0  . glucose blood (ONE TOUCH ULTRA TEST) test strip Use to test blood sugar 4 times daily. 400 each 1  . HYDROmorphone (DILAUDID) 4 MG tablet Take 4 mg by mouth every 4 (four) hours as needed. For kidney stone pain     . Insulin Detemir (LEVEMIR FLEXTOUCH) 100 UNIT/ML Pen Inject 15 units the morning and 20 units in the evening 45 mL 1  . lisinopril (PRINIVIL,ZESTRIL) 20 MG tablet Take 1 tablet (20 mg total) by mouth daily. 90 tablet 3  . metFORMIN (GLUCOPHAGE-XR) 500 MG 24 hr tablet Take 2 tablets (1,000 mg total) by mouth 2 (two) times daily. 360 tablet 3  . naproxen (NAPROSYN) 500 MG tablet Take 1 tablet by mouth  twice a day with a meal 180 tablet 2  . NON FORMULARY Take 2 tablets by mouth daily. Diabetic vitamin packet 1 daily    . NON FORMULARY Alpha-lopic acid 200 mg-Take one daily    . Nutritional Supplements (PROSTATE ENZYME FORMULA PO) Take by mouth. Take 2 in pm    . Pitavastatin Calcium (LIVALO) 2 MG TABS Twice a week 30 tablet 5  . tamsulosin (FLOMAX) 0.4 MG CAPS capsule      No current facility-administered medications on file prior to visit.   Allergies  Allergen Reactions  . Demerol Other (See Comments)    Blood pressure dropped completely out  . Meperidine Hcl Other (See Comments)    Drops blood pressure out  . Nitrospan [Nitroglycerin] Other (See Comments)    Blood pressure dropped out   Family History  Problem  Relation Age of Onset  . Heart disease Brother     Atrial fibrillation  . Heart disease Father     CHF  . Heart disease Mother    PE: BP 128/64 mmHg  Pulse 70  Temp(Src) 97.7 F (36.5 C) (Oral)  Resp 12  Wt 208 lb (94.348 kg)  SpO2 97% Body mass index is 25.33 kg/(m^2). Wt Readings from Last 3 Encounters:  12/11/14 208 lb (94.348 kg)  09/11/14 214  lb (97.07 kg)  07/31/14 214 lb (97.07 kg)   Constitutional: Slightly overweight, in NAD Eyes: PERRLA, EOMI, no exophthalmos ENT: moist mucous membranes, no thyromegaly, no cervical lymphadenopathy Cardiovascular: RRR, No MRG Respiratory: CTA B Gastrointestinal: abdomen soft, NT, ND, BS+ Musculoskeletal: no deformities, strength intact in all 4, right peri-ankle edema Skin: moist, warm, no rashes Neurological: no tremor with outstretched hands, DTR normal in all 4  ASSESSMENT: 1. DM2, insulin-dependent, uncontrolled, without complications  PLAN:  1. Patient with long-standing, fairly well controlled diabetes, on metformin, Glipizide, and basal insulin. He has unexplained high CBGs since last visit (he is having kidney stones, though) >> we increased Glipizide, but this is a temporization method, only. Will decrease the dose back to 10 mg bid, but try to add Invokana.  Patient Instructions  Please continue:  Levemir: 15 units in am and 20 units at bedtime.   Metformin XR 1000 mg 2x a day Please decrease: Glipizide to 10 mg in am before b'fast and 10 mg before dinner.  Start Invokana 100 mg daily in am before b'fast.  Please return in 1 month with your sugar log.   Please stop at the lab. - we discussed about SEs of Invokana, which are: dizziness (advised to be careful when stands from sitting position), decreased BP - usually not < normal (BP today is not low), and fungal UTIs (advised to let me know if develops one). I advised him about possible weight loss >> he does not need this. - given discount card for Invokana -  Continue checking sugars at different times of the day - 3-4 times a day, rotating checks - advised for yearly eye exams >> he is up-to-date - check HbA1c  - Return to clinic in 1 mo with sugar log   Patient did not stop at the lab. We'll need to check A1c at next visit.

## 2014-12-11 NOTE — Patient Instructions (Addendum)
Please continue:  Levemir: 15 units in am and 20 units at bedtime.   Metformin XR 1000 mg 2x a day Please decrease: Glipizide to 10 mg in am before b'fast and 10 mg before dinner.  Start Invokana 100 mg daily in am before b'fast.  Please return in 1 month with your sugar log.   Please stop at the lab.

## 2014-12-18 DIAGNOSIS — N3000 Acute cystitis without hematuria: Secondary | ICD-10-CM | POA: Diagnosis not present

## 2014-12-18 DIAGNOSIS — N200 Calculus of kidney: Secondary | ICD-10-CM | POA: Diagnosis not present

## 2014-12-29 NOTE — Telephone Encounter (Signed)
Patient need a refill of Glipizide

## 2014-12-30 ENCOUNTER — Telehealth: Payer: Self-pay | Admitting: Internal Medicine

## 2015-01-02 ENCOUNTER — Other Ambulatory Visit: Payer: Self-pay | Admitting: *Deleted

## 2015-01-02 MED ORDER — GLIPIZIDE 5 MG PO TABS: 10.0000 mg | ORAL_TABLET | Freq: Two times a day (BID) | ORAL | Status: DC

## 2015-01-03 ENCOUNTER — Other Ambulatory Visit: Payer: Self-pay | Admitting: Internal Medicine

## 2015-01-03 NOTE — Telephone Encounter (Signed)
Patient need a refill of his glipizide

## 2015-01-03 NOTE — Telephone Encounter (Signed)
Refill sent to pt's pharmacy. 

## 2015-01-05 ENCOUNTER — Other Ambulatory Visit: Payer: Self-pay | Admitting: *Deleted

## 2015-01-05 MED ORDER — GLIPIZIDE 5 MG PO TABS: 10.0000 mg | ORAL_TABLET | Freq: Two times a day (BID) | ORAL | Status: DC

## 2015-01-08 DIAGNOSIS — S0502XA Injury of conjunctiva and corneal abrasion without foreign body, left eye, initial encounter: Secondary | ICD-10-CM | POA: Diagnosis not present

## 2015-01-08 DIAGNOSIS — H25813 Combined forms of age-related cataract, bilateral: Secondary | ICD-10-CM | POA: Diagnosis not present

## 2015-01-11 ENCOUNTER — Encounter: Payer: Self-pay | Admitting: Internal Medicine

## 2015-01-11 ENCOUNTER — Ambulatory Visit (INDEPENDENT_AMBULATORY_CARE_PROVIDER_SITE_OTHER): Payer: Medicare Other | Admitting: Internal Medicine

## 2015-01-11 VITALS — BP 124/62 | HR 61 | Temp 97.6°F | Resp 12 | Wt 206.0 lb

## 2015-01-11 DIAGNOSIS — E1165 Type 2 diabetes mellitus with hyperglycemia: Secondary | ICD-10-CM | POA: Diagnosis not present

## 2015-01-11 DIAGNOSIS — IMO0001 Reserved for inherently not codable concepts without codable children: Secondary | ICD-10-CM

## 2015-01-11 LAB — BASIC METABOLIC PANEL WITH GFR
BUN: 20 mg/dL (ref 6–23)
CO2: 27 mEq/L (ref 19–32)
Calcium: 9.2 mg/dL (ref 8.4–10.5)
Chloride: 102 mEq/L (ref 96–112)
Creat: 0.89 mg/dL (ref 0.50–1.35)
GFR, Est African American: >89 mL/min
GFR, Est Non African American: >89 mL/min
Glucose, Bld: 126 mg/dL — ABNORMAL HIGH (ref 70–99)
Potassium: 5.2 mEq/L (ref 3.5–5.3)
Sodium: 136 mEq/L (ref 135–145)

## 2015-01-11 LAB — HEMOGLOBIN A1C: Hgb A1c MFr Bld: 7.5 % — ABNORMAL HIGH (ref 4.6–6.5)

## 2015-01-11 MED ORDER — CANAGLIFLOZIN 100 MG PO TABS: 100.0000 mg | ORAL_TABLET | Freq: Every day | ORAL | Status: DC

## 2015-01-11 NOTE — Progress Notes (Signed)
Patient ID: [name], male   DOB: [birth year], 64 y.o.   MRN: [id number]  HPI: Nathaniel Thomas is a 64 y.o.-year-old male, returning for f/u for DM2, dx 1999, insulin-dependent since 2008, uncontrolled, without complications. Last visit 1 mo ago.  Last hemoglobin A1c was: Lab Results  Component Value Date   HGBA1C 7.2* 07/31/2014   HGBA1C 7.6* 04/13/2014   HGBA1C 6.7* 11/11/2013  He had a steroid inj and Prednisone taper in 07/2014.[name]   Pt is on a regimen of: - Metformin XR 1000 mg po bid - Glipizide 10 mg in am before b'fast and 10 mg before dinner.  - Invokana 100 mg daily in am before b'fast - started 12/2014 - Levemir 15 units in am and 20 units in pm We stopped Humalog 75/25 15 in am, 10-20 at lunch, 8-20 at dinner  Pt checks his sugars 3-4 x a day: - am: 56, 86-143 >> 73, 82-138 >> 134-185, 204 >> 109-160, 172 >> 76, 91-140 - 2h after b'fast: 61, 80-132 >> 122, 154 >> 149-211 >> 132-168 >> 177-195 - before lunch: 87-160, 210 >> 80-142, 155 >> 75-160, 196 >> 72-154 >> 71, 82-135 - 2h after lunch: 67, 104-175, 240 >> 176 >> 88-164, 213 >> 141-194, 282 >> 100, 150 - before dinner: 136-204, 240 >> 94, 122-151, 204 >> 103-197, 225 >> 130-257 >> 88-149 - 2h after dinner: 107-200, 279 >> 83-185, 231 (grapes), 236 >> 94-193 >> 56, 212-334 >> n/c - nighttime: 49-59 >> 101-167, 195 >> 120-153, 185 No lows. Lowest sugar was 71;  he has hypoglycemia awareness at 60.  Highest sugar was 236 >> 225 >> 205.  Ptats 6x a day, drinks 8 glasses of water; 4 servings of veggies a day. Pt's meals are: - Breakfast: [place] bacon, 1-2 eggs, butter; protein shake 3x a week; toast - Lunch: salad - large; rotisserie chicken or cheese - Dinner: chicken chilli, brown rice - Snacks: fruits, fruit cake; nuts; black bean chips  He exercises 2x times a week by doing cardio and lifting weights.  - no CKD, last BUN/creatinine:  Lab Results  Component Value Date   BUN 14 04/13/2014   CREATININE 0.9  04/13/2014  On Lisinopril - last set of lipids: Lab Results  Component Value Date   CHOL 136 04/13/2014   HDL 27.60* 04/13/2014   LDLCALC 89 04/13/2014   LDLDIRECT 98.9 05/17/2010   TRIG 98.0 04/13/2014   CHOLHDL 5 04/13/2014  On Pitavastatin. - last eye exam was in 04/2014. No DR. + glaucoma.  - no numbness and tingling in his feet.  ROS: Constitutional: no weight gain/loss, no fatigue, no subjective hyperthermia/hypothermia; + nocturia x2 Eyes: no blurry vision, no xerophthalmia ENT: no sore throat, no nodules palpated in throat, no dysphagia/odynophagia, no hoarseness Cardiovascular: no CP/SOB/palpitations/leg swelling Respiratory:no cough/no SOB Gastrointestinal: no N/V/D/C Musculoskeletal: no muscle/joint aches Skin: no rashes Neurological: no tremors/numbness/tingling/dizziness  I reviewed pt's medications, allergies, PMH, social hx, family hx, and changes were documented in the history of present illness. Otherwise, unchanged from my initial visit note:  Past Medical History  Diagnosis Date  . Diabetes mellitus   . Hypertension   . Hyperlipidemia   . Chronic kidney disease   . Renal calculus or stone   . Glaucoma   . CAD (coronary artery disease)   . Skin cancer of nose     ears and hands  . CHICKENPOX, HX OF 08/30/2008    Qualifier: Diagnosis of  By: [name], [name]    .  History of urinary calculi 08/30/2008    Qualifier: Diagnosis of  By: [name], Jacelyn Pi    Past Surgical History  Procedure Laterality Date  . Left knee replacement  2002/ 2004  . Tonsillectomy    . Lithotripsy      was cancelled/pt passed stone  . Laminectomy  1983/1988  . Basal cell carcinoma excision      hands  . Ankle surgery      2013/ wears boot cast   History   Social History  . Marital Status: Married    Spouse Name: N/A    Number of Children: 2   Occupational History  .      Retired   Social History Main Topics  . Smoking status: Never Smoker   . Smokeless tobacco:  Never Used  . Alcohol Use: No  . Drug Use: No   Current Outpatient Prescriptions on File Prior to Visit  Medication Sig Dispense Refill  . aspirin EC 325 MG tablet Take 1 tablet (325 mg total) by mouth daily. 30 tablet 0  . bimatoprost (LUMIGAN) 0.03 % ophthalmic solution Place 1 drop into both eyes at bedtime.    . canagliflozin (INVOKANA) 100 MG TABS tablet Take 1 tablet (100 mg total) by mouth daily. 30 tablet 2  . CIALIS 20 MG tablet Take 1 tablet by mouth  daily as needed for   erectile dysfunction. 6 tablet 5  . glipiZIDE (GLUCOTROL) 5 MG tablet TAKE TWO TABLETS BY MOUTH TWICE DAILY BEFORE MEAL(S) 120 tablet 2  . glipiZIDE (GLUCOTROL) 5 MG tablet Take 2 tablets (10 mg total) by mouth 2 (two) times daily before a meal. 360 tablet 0  . glucose blood (ONE TOUCH ULTRA TEST) test strip Use to test blood sugar 4 times daily. 400 each 1  . HYDROmorphone (DILAUDID) 4 MG tablet Take 4 mg by mouth every 4 (four) hours as needed. For kidney stone pain     . Insulin Detemir (LEVEMIR FLEXTOUCH) 100 UNIT/ML Pen Inject 15 units the morning and 20 units in the evening 45 mL 1  . lisinopril (PRINIVIL,ZESTRIL) 20 MG tablet Take 1 tablet (20 mg total) by mouth daily. 90 tablet 3  . metFORMIN (GLUCOPHAGE-XR) 500 MG 24 hr tablet Take 2 tablets (1,000 mg total) by mouth 2 (two) times daily. 360 tablet 3  . naproxen (NAPROSYN) 500 MG tablet Take 1 tablet by mouth  twice a day with a meal 180 tablet 2  . NON FORMULARY Take 2 tablets by mouth daily. Diabetic vitamin packet 1 daily    . NON FORMULARY Alpha-lopic acid 200 mg-Take one daily    . Nutritional Supplements (PROSTATE ENZYME FORMULA PO) Take by mouth. Take 2 in pm    . Pitavastatin Calcium (LIVALO) 2 MG TABS Twice a week 30 tablet 5  . tamsulosin (FLOMAX) 0.4 MG CAPS capsule      No current facility-administered medications on file prior to visit.   Allergies  Allergen Reactions  . Demerol Other (See Comments)    Blood pressure dropped completely  out  . Meperidine Hcl Other (See Comments)    Drops blood pressure out  . Nitrospan [Nitroglycerin] Other (See Comments)    Blood pressure dropped out   Family History  Problem Relation Age of Onset  . Heart disease Brother     Atrial fibrillation  . Heart disease Father     CHF  . Heart disease Mother    PE: BP 124/62 mmHg  Pulse 61  Temp(Src) 97.6 F (36.4 C) (Oral)  Resp 12  Wt 206 lb (93.441 kg)  SpO2 96% Body mass index is 25.09 kg/(m^2). Wt Readings from Last 3 Encounters:  01/11/15 206 lb (93.441 kg)  12/11/14 208 lb (94.348 kg)  09/11/14 214 lb (97.07 kg)   Constitutional: normal weight, in NAD Eyes: PERRLA, EOMI, no exophthalmos ENT: moist mucous membranes, no thyromegaly, no cervical lymphadenopathy Cardiovascular: RRR, No MRG Respiratory: CTA B Gastrointestinal: abdomen soft, NT, ND, BS+ Musculoskeletal: no deformities, strength intact in all 4, right peri-ankle edema Skin: moist, warm, no rashes Neurological: no tremor with outstretched hands, DTR normal (except L patellar - TKR)  ASSESSMENT: 1. DM2, insulin-dependent, uncontrolled, without complications  PLAN:  1. Patient with long-standing, fairly well controlled diabetes, on metformin, Glipizide, and basal insulin. He had unexplained high CBGs before last visit (he was having kidney stones, though) >> added Invokana. He is doing very well on this >> sugars improved.  Patient Instructions  Please continue:  Levemir: 15 units in am and 20 units at bedtime.   Metformin XR 1000 mg 2x a day Glipizide to 10 mg in am before b'fast and 10 mg before dinner.    Invokana 100 mg daily in am before b'fast.  Please return in 3 months with your sugar log.   Please stop at the lab.  - Continue checking sugars at different times of the day - 3-4 times a day, rotating checks - advised for yearly eye exams >> he is up-to-date - check HbA1c and BMP today - refilled Invokana - Return to clinic in 3 mo with sugar  log   Office Visit on 01/11/2015  Component Date Value Ref Range Status  . Hgb A1c MFr Bld 01/11/2015 7.5* 4.6 - 6.5 % Final   Glycemic Control Guidelines for People with Diabetes:Non Diabetic:  <6%Goal of Therapy: <7%Additional Action Suggested:  >8%   . Sodium 01/11/2015 136  135 - 145 mEq/L Final  . Potassium 01/11/2015 5.2  3.5 - 5.3 mEq/L Final  . Chloride 01/11/2015 102  96 - 112 mEq/L Final  . CO2 01/11/2015 27  19 - 32 mEq/L Final  . Glucose, Bld 01/11/2015 126* 70 - 99 mg/dL Final  . BUN 01/11/2015 20  6 - 23 mg/dL Final  . Creat 01/11/2015 0.89  0.50 - 1.35 mg/dL Final  . Calcium 01/11/2015 9.2  8.4 - 10.5 mg/dL Final  . GFR, Est African American 01/11/2015 >89   Final  . GFR, Est Non African American 01/11/2015 >89   Final   Comment:   The estimated GFR is a calculation valid for adults (>=60 years old) that uses the CKD-EPI algorithm to adjust for age and sex. It is   not to be used for children, pregnant women, hospitalized patients,    patients on dialysis, or with rapidly changing kidney function. According to the NKDEP, eGFR >89 is normal, 60-89 shows mild impairment, 30-59 shows moderate impairment, 15-29 shows severe impairment and <15 is ESRD.      HbA1c higher than before, but this is due to higher sugars before last visit. BMP normal.

## 2015-01-11 NOTE — Patient Instructions (Signed)
Patient Instructions  Please continue:  Levemir: 15 units in am and 20 units at bedtime.   Metformin XR 1000 mg 2x a day Glipizide to 10 mg in am before b'fast and 10 mg before dinner.   Invokana 100 mg daily in am before b'fast.  Please return in 3 months with your sugar log.   Please stop at the lab.

## 2015-02-16 NOTE — Telephone Encounter (Deleted)
ne

## 2015-02-16 NOTE — Telephone Encounter (Signed)
n

## 2015-02-23 ENCOUNTER — Ambulatory Visit: Payer: Self-pay | Admitting: Family Medicine

## 2015-02-23 ENCOUNTER — Encounter: Payer: Self-pay | Admitting: Family Medicine

## 2015-02-23 ENCOUNTER — Ambulatory Visit (INDEPENDENT_AMBULATORY_CARE_PROVIDER_SITE_OTHER): Payer: Medicare Other | Admitting: Family Medicine

## 2015-02-23 ENCOUNTER — Ambulatory Visit: Payer: 59 | Admitting: Family Medicine

## 2015-02-23 VITALS — BP 136/78 | HR 64 | Temp 98.0°F | Wt 207.0 lb

## 2015-02-23 DIAGNOSIS — E785 Hyperlipidemia, unspecified: Secondary | ICD-10-CM | POA: Diagnosis not present

## 2015-02-23 DIAGNOSIS — M25571 Pain in right ankle and joints of right foot: Secondary | ICD-10-CM

## 2015-02-23 DIAGNOSIS — E786 Lipoprotein deficiency: Secondary | ICD-10-CM | POA: Diagnosis not present

## 2015-02-23 DIAGNOSIS — N400 Enlarged prostate without lower urinary tract symptoms: Secondary | ICD-10-CM

## 2015-02-23 DIAGNOSIS — G8929 Other chronic pain: Secondary | ICD-10-CM

## 2015-02-23 DIAGNOSIS — I10 Essential (primary) hypertension: Secondary | ICD-10-CM

## 2015-02-23 LAB — LDL CHOLESTEROL, DIRECT: Direct LDL: 113 mg/dL

## 2015-02-23 NOTE — Assessment & Plan Note (Signed)
Lisinopril 20 mg--> 10mg  after starting invokana due to orthostatic symptoms. Reasonable control despite decrease- continue home monitoring

## 2015-02-23 NOTE — Patient Instructions (Addendum)
On recheck, blood pressure is controlled. Continue to watch at home 1-2 x a week and goal <140/90 with 6 month recheck planned. Ok to continue 1/2 pill of lisinopril. If remains controlled over next 6 months, we will plan on sending in 10mg  pill  Check in on bad cholesterol off livalo, given your heart blockage history, I would advise you to restart- you wanted to wait on labs to decide.   We will call you within a week about your referral to Dr. Doran Durand. If you do not hear within 2 weeks, give [place] a call.

## 2015-02-23 NOTE — Assessment & Plan Note (Signed)
Refer to Dr. Doran Durand for opinion on repeat surgery after reportedly "failed fusion"

## 2015-02-23 NOTE — Assessment & Plan Note (Signed)
Check direct LDL, prefer <70 with CAD history but would tolerate <100. Suspicious will be elevated as came off livalo. Patient worried about SE though was not experiencing any- family member told him to stop and is apparent in PT or chiropractor.

## 2015-02-23 NOTE — Progress Notes (Signed)
[name], MD  Subjective:  Nathaniel Thomas is a 64 y.o. year old very pleasant male patient who presents with:  Hypertension-reasonable control Taking 10mg  lisinopril as was getting dizzy once started invokana, that has improved BP Readings from Last 3 Encounters:  02/23/15 136/78  01/11/15 124/62  12/11/14 128/64   Home BP monitoring-yes and typically in 100s to 120s and DBP controlled Compliant with medications-yes but reduced dose ROS-Denies any CP, HA, SOB, blurry vision, LE edema  Hyperlipidemia-suspect poor control as stopped livalo  Lab Results  Component Value Date   LDLCALC 89 04/13/2014  Regular exercise: yes ROS- no chest pain or shortness of breath. No myalgias  Chronic Right ankle pain- stable Has seen Dr. Oneida Alar and some improved control of pain with orthotics. Wants the opinion of [place] ortho Dr. Doran Durand for R ankle chronic pain. 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 care, DM under Dr. Cruzita Lederer care, BPH, ED, HTN, HLD  Medications- reviewed and updated Current Outpatient Prescriptions  Medication Sig Dispense Refill  . aspirin EC 325 MG tablet Take 1 tablet (325 mg total) by mouth daily. 30 tablet 0  . bimatoprost (LUMIGAN) 0.03 % ophthalmic solution Place 1 drop into both eyes at bedtime.    . canagliflozin (INVOKANA) 100 MG TABS tablet Take 1 tablet (100 mg total) by mouth daily. 90 tablet 1  . CIALIS 20 MG tablet Take 1 tablet by mouth  daily as needed for   erectile dysfunction. 6 tablet 5  . glipiZIDE (GLUCOTROL) 5 MG tablet TAKE 2 TABLETS BY MOUTH 2  TIMES DAILY BEFORE A MEAL. 360 tablet 1  . glipiZIDE (GLUCOTROL) 5 MG tablet Take 2 tablets (10 mg total) by mouth 2 (two) times daily before a meal. 360 tablet 0  . glucose blood (ONE TOUCH ULTRA TEST) test strip Use to test blood sugar 4 times daily. 400 each 1  . Insulin Detemir (LEVEMIR FLEXTOUCH) 100 UNIT/ML Pen Inject 15 units the morning and 20 units in the evening 45 mL  1  . lisinopril (PRINIVIL,ZESTRIL) 20 MG tablet Take 1 tablet (20 mg total) by mouth daily. 90 tablet 3  . metFORMIN (GLUCOPHAGE-XR) 500 MG 24 hr tablet Take 2 tablets (1,000 mg total) by mouth 2 (two) times daily. 360 tablet 3  . naproxen (NAPROSYN) 500 MG tablet Take 1 tablet by mouth  twice a day with a meal 180 tablet 2  . NON FORMULARY Take 2 tablets by mouth daily. Diabetic vitamin packet 1 daily    . NON FORMULARY Alpha-lopic acid 200 mg-Take one daily    . Nutritional Supplements (PROSTATE ENZYME FORMULA PO) Take by mouth. Take 2 in pm    . Pitavastatin Calcium (LIVALO) 2 MG TABS Twice a week 30 tablet 5  . tamsulosin (FLOMAX) 0.4 MG CAPS capsule     . trimethoprim-polymyxin b (POLYTRIM) ophthalmic solution Place 2 drops into the left eye every 6 (six) hours.    [name] HYDROmorphone (DILAUDID) 4 MG tablet Take 4 mg by mouth every 4 (four) hours as needed. For kidney stone pain      No current facility-administered medications for this visit.    Objective: BP 136/78 mmHg  Pulse 64  Temp(Src) 98 F (36.7 C)  Wt 207 lb (93.895 kg) Gen: NAD, resting comfortably CV: RRR no murmurs rubs or gallops Lungs: CTAB no crackles, wheeze, rhonchi Abdomen: soft/nontender/nondistended/normal bowel sounds.  Ext: no edema, Right ankle brace in place Skin: warm, dry Neuro: grossly  normal, moves all extremities   Assessment/Plan:  Hyperlipidemia with low HDL Check direct LDL, prefer <70 with CAD history but would tolerate <100. Suspicious will be elevated as came off livalo. Patient worried about SE though was not experiencing any- family member told him to stop and is apparent in PT or chiropractor.   Chronic ankle pain Refer to Dr. Doran Durand for opinion on repeat surgery after reportedly "failed fusion"  Essential hypertension Lisinopril 20 mg--> 10mg  after starting invokana due to orthostatic symptoms. Reasonable control despite decrease- continue home monitoring  6 month in office  planned  Orders Placed This Encounter  Procedures  . LDL cholesterol, direct    [place]  . Ambulatory referral to Orthopedic Surgery    Referral Priority:  Routine    Referral Type:  Surgical    Referral Reason:  Specialty Services Required    Referred to Provider:  Wylene Simmer, MD    Requested Specialty:  Orthopedic Surgery    Number of Visits Requested:  1

## 2015-02-28 ENCOUNTER — Other Ambulatory Visit: Payer: Self-pay | Admitting: *Deleted

## 2015-02-28 MED ORDER — TADALAFIL 20 MG PO TABS: ORAL_TABLET | ORAL | Status: DC

## 2015-03-15 ENCOUNTER — Telehealth: Payer: Self-pay | Admitting: Family Medicine

## 2015-03-15 MED ORDER — NAPROXEN 500 MG PO TABS: ORAL_TABLET | ORAL | Status: DC

## 2015-03-15 NOTE — Telephone Encounter (Signed)
Refill request for Naprosyn 500 mg take 1 po bid with meal and a 90 day supply to [facility].

## 2015-04-09 ENCOUNTER — Other Ambulatory Visit: Payer: Self-pay | Admitting: Internal Medicine

## 2015-04-13 ENCOUNTER — Ambulatory Visit (INDEPENDENT_AMBULATORY_CARE_PROVIDER_SITE_OTHER): Payer: Medicare Other | Admitting: Internal Medicine

## 2015-04-13 ENCOUNTER — Encounter: Payer: Self-pay | Admitting: Internal Medicine

## 2015-04-13 ENCOUNTER — Other Ambulatory Visit (INDEPENDENT_AMBULATORY_CARE_PROVIDER_SITE_OTHER): Payer: Medicare Other | Admitting: *Deleted

## 2015-04-13 VITALS — BP 124/72 | HR 98 | Temp 98.1°F | Resp 16 | Ht 76.0 in | Wt 202.8 lb

## 2015-04-13 DIAGNOSIS — IMO0001 Reserved for inherently not codable concepts without codable children: Secondary | ICD-10-CM

## 2015-04-13 DIAGNOSIS — Z23 Encounter for immunization: Secondary | ICD-10-CM

## 2015-04-13 DIAGNOSIS — E1165 Type 2 diabetes mellitus with hyperglycemia: Secondary | ICD-10-CM

## 2015-04-13 LAB — POCT GLYCOSYLATED HEMOGLOBIN (HGB A1C): Hemoglobin A1C: 7.5

## 2015-04-13 NOTE — Progress Notes (Signed)
Patient ID: Nathaniel Thomas, male   DOB: [birth year], 64 y.o.   MRN: [id number]  HPI: Nathaniel Thomas is a 64 y.o.-year-old male, returning for f/u for DM2, dx 1999, insulin-dependent since 2008, uncontrolled, without complications. Last visit 3 mo ago.  He is on an ABx for Prostatitis >> Bactrim >> itching >> took Benadryl last night.  Last hemoglobin A1c was: Lab Results  Component Value Date   HGBA1C 7.5* 01/11/2015   HGBA1C 7.2* 07/31/2014   HGBA1C 7.6* 04/13/2014  He had a steroid inj and Prednisone taper in 07/2014.[name]   Pt is on a regimen of: - Metformin XR 1000 mg po bid - Glipizide 10 mg in am before b'fast and 10 mg before dinner.  - Invokana 100 mg daily in am before b'fast - started 12/2014 - Levemir 15 units in am and 25-30 units in pm We stopped Humalog 75/25 15 in am, 10-20 at lunch, 8-20 at dinner  Pt checks his sugars 3-4 x a day: - am: 73, 82-138 >> 134-185, 204 >> 109-160, 172 >> 76, 91-140 >> 71, 91-150,170 - 2h after b'fast: 122, 154 >> 149-211 >> 132-168 >> 177-195 >> 152-211, 235x1(cereals) - before lunch:80-142, 155 >> 75-160, 196 >> 72-154 >> 71, 82-135 >> 86-135, 250x1 - 2h after lunch: 67, 104-175, 240 >> 176 >> 88-164, 213 >> 141-194, 282 >> 100, 150 >> 160-175 - before dinner: 103-197, 225 >> 130-257 >> 88-149 >> 143- 205, 254 (higher 2/2 prostatitis) - 2h after dinner: 83-185, 231 (grapes), 236 >> 94-193 >> 56, 212-334 >> n/c >> 180-205 - nighttime: 49-59 >> 101-167, 195 >> 120-153, 185 >> 107-207, higher with prostatitis No lows. Lowest sugar was 71;  he has hypoglycemia awareness at 60.  Highest sugar was 236 >> 225 >> 205 >> high 200s.  Pt's meals are: - Breakfast: [place] bacon, 1-2 eggs, butter; protein shake 3x a week; toast - Lunch: salad - large; rotisserie chicken or cheese - Dinner: chicken chilli, brown rice - Snacks: fruits, fruit cake; nuts; black bean chips  He exercises 2x times a week by doing cardio and lifting weights.  - no CKD, last  BUN/creatinine:  Lab Results  Component Value Date   BUN 20 01/11/2015   CREATININE 0.89 01/11/2015  On Lisinopril - last set of lipids: Lab Results  Component Value Date   CHOL 136 04/13/2014   HDL 27.60* 04/13/2014   LDLCALC 89 04/13/2014   LDLDIRECT 113.0 02/23/2015   TRIG 98.0 04/13/2014   CHOLHDL 5 04/13/2014  On Pitavastatin. - last eye exam was in 04/2014. No DR. + glaucoma.  - no numbness and tingling in his feet.  ROS: Constitutional: no weight gain/loss, +  fatigue, no subjective hyperthermia/hypothermia; + nocturia , + dysuria, + excessive urination Eyes: no blurry vision, no xerophthalmia ENT: no sore throat, no nodules palpated in throat, no dysphagia/odynophagia, no hoarseness Cardiovascular: no CP/SOB/palpitations/leg swelling Respiratory:no cough/no SOB Gastrointestinal: no N/V/D/C Musculoskeletal: no muscle/joint aches Skin: + rash, + itching Neurological: no tremors/numbness/tingling/dizziness  I reviewed pt's medications, allergies, PMH, social hx, family hx, and changes were documented in the history of present illness. Otherwise, unchanged from my initial visit note:  Past Medical History  Diagnosis Date  . Diabetes mellitus   . Hypertension   . Hyperlipidemia   . Chronic kidney disease   . Renal calculus or stone   . Glaucoma   . CAD (coronary artery disease)   . Skin cancer of nose     ears and  hands  . CHICKENPOX, HX OF 08/30/2008    Qualifier: Diagnosis of  By: [name], [name]    . History of urinary calculi 08/30/2008    Qualifier: Diagnosis of  By: [name], Jacelyn Pi    Past Surgical History  Procedure Laterality Date  . Left knee replacement  2002/ 2004  . Tonsillectomy    . Lithotripsy      was cancelled/pt passed stone  . Laminectomy  1983/1988  . Basal cell carcinoma excision      hands  . Ankle surgery      2013/ wears boot cast   History   Social History  . Marital Status: Married    Spouse Name: N/A    Number of  Children: 2   Occupational History  .      Retired   Social History Main Topics  . Smoking status: Never Smoker   . Smokeless tobacco: Never Used  . Alcohol Use: No  . Drug Use: No   Current Outpatient Prescriptions on File Prior to Visit  Medication Sig Dispense Refill  . aspirin EC 325 MG tablet Take 1 tablet (325 mg total) by mouth daily. 30 tablet 0  . bimatoprost (LUMIGAN) 0.03 % ophthalmic solution Place 1 drop into both eyes at bedtime.    . canagliflozin (INVOKANA) 100 MG TABS tablet Take 1 tablet (100 mg total) by mouth daily. 90 tablet 1  . glipiZIDE (GLUCOTROL) 5 MG tablet TAKE 2 TABLETS BY MOUTH 2  TIMES DAILY BEFORE A MEAL. 360 tablet 1  . HYDROmorphone (DILAUDID) 4 MG tablet Take 4 mg by mouth every 4 (four) hours as needed. For kidney stone pain     . LEVEMIR FLEXTOUCH 100 UNIT/ML Pen Inject 15 units in the  morning and 20 units in the evening 45 mL 0  . lisinopril (PRINIVIL,ZESTRIL) 20 MG tablet Take 1 tablet (20 mg total) by mouth daily. 90 tablet 3  . metFORMIN (GLUCOPHAGE-XR) 500 MG 24 hr tablet Take 2 tablets (1,000 mg total) by mouth 2 (two) times daily. 360 tablet 3  . naproxen (NAPROSYN) 500 MG tablet Take 1 tablet by mouth  twice a day with a meal 180 tablet 4  . NON FORMULARY Take 2 tablets by mouth daily. Diabetic vitamin packet 1 daily    . NON FORMULARY Alpha-lopic acid 200 mg-Take one daily    . Nutritional Supplements (PROSTATE ENZYME FORMULA PO) Take by mouth. Take 2 in pm    . ONE TOUCH ULTRA TEST test strip Use to test blood sugar 4  times daily 400 each 1  . Pitavastatin Calcium (LIVALO) 2 MG TABS Twice a week 30 tablet 5  . tadalafil (CIALIS) 20 MG tablet Take 1 tablet by mouth  daily as needed for   erectile dysfunction. 6 tablet 5  . tamsulosin (FLOMAX) 0.4 MG CAPS capsule     . trimethoprim-polymyxin b (POLYTRIM) ophthalmic solution Place 2 drops into the left eye every 6 (six) hours.     No current facility-administered medications on file prior  to visit.   Allergies  Allergen Reactions  . Demerol Other (See Comments)    Blood pressure dropped completely out  . Meperidine Hcl Other (See Comments)    Drops blood pressure out  . Nitrospan [Nitroglycerin] Other (See Comments)    Blood pressure dropped out   Family History  Problem Relation Age of Onset  . Heart disease Brother     Atrial fibrillation  . Heart disease Father  CHF  . Heart disease Mother    PE: BP 124/72 mmHg  Pulse 98  Temp(Src) 98.1 F (36.7 C)  Resp 16  Ht 6\' 4"  (1.93 m)  Wt 202 lb 12.8 oz (91.989 kg)  BMI 24.70 kg/m2  SpO2 96% Body mass index is 24.7 kg/(m^2). Wt Readings from Last 3 Encounters:  04/13/15 202 lb 12.8 oz (91.989 kg)  02/23/15 207 lb (93.895 kg)  01/11/15 206 lb (93.441 kg)   Constitutional: normal weight, in NAD Eyes: PERRLA, EOMI, no exophthalmos ENT: moist mucous membranes, no thyromegaly, no cervical lymphadenopathy Cardiovascular: RRR, No MRG Respiratory: CTA B Gastrointestinal: abdomen soft, NT, ND, BS+ Musculoskeletal: no deformities, strength intact in all 4, right peri-ankle edema Skin: moist, warm, no rashes Neurological: no tremor with outstretched hands, DTR normal (except L patellar - TKR)  ASSESSMENT: 1. DM2, insulin-dependent, uncontrolled, without complications  PLAN:  1. Patient with long-standing, fairly well controlled diabetes, on metformin, Invokana, Glipizide, and basal insulin. He had high CBGs with his prostatitis episode, now better in last 2 days after adding Bactrim. Sugars before dinner = high >> if still high when infection/inflammation resolves >> add Glipizide with lunch, too.  Patient Instructions  Please continue: - Metformin XR 1000 mg 2x a day - Glipizide 10 mg in am before b'fast and 10 mg before dinner.  - Invokana 100 mg daily in am before b'fast  - Levemir 15 units in am and 25 units in pm  Please check sugars over the next 2 weeks and if they are still high before dinner >> add  5 mg Glipizide with lunch.   Please return in 3 months with your sugar log.   - Continue checking sugars at different times of the day - 3-4 times a day, rotating checks - advised for yearly eye exams >> he is up-to-date - given flu vaccine today - check HbA1c today >> 7.5% (stable) - Return to clinic in 3 mo with sugar log

## 2015-04-13 NOTE — Patient Instructions (Addendum)
Please continue: - Metformin XR 1000 mg 2x a day - Glipizide 10 mg in am before b'fast and 10 mg before dinner.  - Invokana 100 mg daily in am before b'fast  - Levemir 15 units in am and 25 units in pm  Please check sugars over the next 2 weeks and if they are still high before dinner >> add 5 mg Glipizide with lunch.   Please return in 3 months with your sugar log.

## 2015-04-19 LAB — HM DIABETES EYE EXAM

## 2015-04-24 ENCOUNTER — Encounter: Payer: Self-pay | Admitting: Family Medicine

## 2015-04-26 ENCOUNTER — Encounter: Payer: Self-pay | Admitting: Family Medicine

## 2015-05-14 ENCOUNTER — Encounter: Payer: Self-pay | Admitting: Internal Medicine

## 2015-05-23 ENCOUNTER — Encounter: Payer: Self-pay | Admitting: Family Medicine

## 2015-06-23 ENCOUNTER — Other Ambulatory Visit: Payer: Self-pay | Admitting: Family Medicine

## 2015-07-13 ENCOUNTER — Encounter: Payer: Self-pay | Admitting: Internal Medicine

## 2015-07-13 ENCOUNTER — Other Ambulatory Visit (INDEPENDENT_AMBULATORY_CARE_PROVIDER_SITE_OTHER): Payer: Medicare Other | Admitting: *Deleted

## 2015-07-13 ENCOUNTER — Ambulatory Visit (INDEPENDENT_AMBULATORY_CARE_PROVIDER_SITE_OTHER): Payer: Medicare Other | Admitting: Internal Medicine

## 2015-07-13 VITALS — BP 120/72 | HR 63 | Temp 97.6°F | Resp 12 | Wt 211.0 lb

## 2015-07-13 DIAGNOSIS — E1165 Type 2 diabetes mellitus with hyperglycemia: Secondary | ICD-10-CM

## 2015-07-13 DIAGNOSIS — Z794 Long term (current) use of insulin: Secondary | ICD-10-CM

## 2015-07-13 DIAGNOSIS — IMO0001 Reserved for inherently not codable concepts without codable children: Secondary | ICD-10-CM

## 2015-07-13 LAB — POCT GLYCOSYLATED HEMOGLOBIN (HGB A1C): Hemoglobin A1C: 7.3

## 2015-07-13 NOTE — Progress Notes (Signed)
Patient ID: Nathaniel Thomas, male   DOB: 11-Apr-1951, 64 y.o.   MRN: [id number]  HPI: Nathaniel Thomas is a 64 y.o.-year-old male, returning for f/u for DM2, dx 1999, insulin-dependent since 2008, uncontrolled, without complications. Last visit 3 mo ago.  He has a UTI >> was on ABx, now essential oils x 1 week >> already saw an improvement.  Last hemoglobin A1c was: Lab Results  Component Value Date   HGBA1C 7.5 04/13/2015   HGBA1C 7.5* 01/11/2015   HGBA1C 7.2* 07/31/2014  He had a steroid inj and Prednisone taper in 07/2014.  Pt is on a regimen of: - Metformin XR 1000 mg po bid - Glipizide 10 mg in am before b'fast, 5 mg before lunch (with a larger lunch), and 10 mg before dinner.  - Invokana 100 mg daily in am before b'fast - started 12/2014 - Levemir 15 units in am and 25-30 units in pm We stopped Humalog 75/25 15 in am, 10-20 at lunch, 8-20 at dinner  Pt checks his sugars 3-4 x a day - slightly better: - am: 73, 82-138 >> 134-185, 204 >> 109-160, 172 >> 76, 91-140 >> 71, 91-150,170 >> 75-153, 183 - 2h after b'fast: 122, 154 >> 149-211 >> 132-168 >> 177-195 >> 152-211, 235x1(cereals) >> 127-185, 236, 280 - before lunch:80-142, 155 >> 75-160, 196 >> 72-154 >> 71, 82-135 >> 86-135, 250x1 >> 80-133, 166 - 2h after lunch: 67, 104-175, 240 >> 176 >> 88-164, 213 >> 141-194, 282 >> 100, 150 >> 160-175 >> 162-206, 220 - before dinner: 103-197, 225 >> 130-257 >> 88-149 >> 143- 205, 254 (higher 2/2 prostatitis) >> 98-144 - 2h after dinner: 83-185, 231 (grapes), 236 >> 94-193 >> 56, 212-334 >> n/c >> 180-205 >> 156, 176, 178 - nighttime: 49-59 >> 101-167, 195 >> 120-153, 185 >> 107-207, higher with prostatitis No lows. Lowest sugar was 71;  he has hypoglycemia awareness at 60.  Highest sugar was 236 >> 225 >> 205 >> high 200s >> 338.   Pt's meals are: - Breakfast: [place] bacon, 1-2 eggs, butter; protein shake 3x a week; toast - Lunch: salad - large; rotisserie chicken or cheese - Dinner: chicken  chilli, brown rice  - Snacks: fruits, fruit cake; nuts; black bean chips  He exercises 2x times a week by doing cardio and lifting weights.  - no CKD, last BUN/creatinine:  Lab Results  Component Value Date   BUN 20 01/11/2015   CREATININE 0.89 01/11/2015  On Lisinopril - last set of lipids: Lab Results  Component Value Date   CHOL 136 04/13/2014   HDL 27.60* 04/13/2014   LDLCALC 89 04/13/2014   LDLDIRECT 113.0 02/23/2015   TRIG 98.0 04/13/2014   CHOLHDL 5 04/13/2014  On Pitavastatin. - last eye exam was in 04/19/2015. No DR. + glaucoma.  - no numbness and tingling in his feet.  ROS: Constitutional: no weight gain/loss, + fatigue, no subjective hyperthermia/hypothermia; + nocturia Eyes: no blurry vision, no xerophthalmia ENT: no sore throat, no nodules palpated in throat, no dysphagia/odynophagia, no hoarseness Cardiovascular: no CP/SOB/palpitations/leg swelling Respiratory:no cough/no SOB Gastrointestinal: no N/V/D/C Musculoskeletal: no muscle/joint aches Skin: no rash Neurological: no tremors/numbness/tingling/dizziness  I reviewed pt's medications, allergies, PMH, social hx, family hx, and changes were documented in the history of present illness. Otherwise, unchanged from my initial visit note:  Past Medical History  Diagnosis Date  . Diabetes mellitus   . Hypertension   . Hyperlipidemia   . Chronic kidney disease   . Renal  calculus or stone   . Glaucoma   . CAD (coronary artery disease)   . Skin cancer of nose     ears and hands  . CHICKENPOX, HX OF 08/30/2008    Qualifier: Diagnosis of  By: [name], [name]    . History of urinary calculi 08/30/2008    Qualifier: Diagnosis of  By: [name], Jacelyn Pi    Past Surgical History  Procedure Laterality Date  . Left knee replacement  2002/ 2004  . Tonsillectomy    . Lithotripsy      was cancelled/pt passed stone  . Laminectomy  1983/1988  . Basal cell carcinoma excision      hands  . Ankle surgery      2013/  wears boot cast   History   Social History  . Marital Status: Married    Spouse Name: N/A    Number of Children: 2   Occupational History  .      Retired   Social History Main Topics  . Smoking status: Never Smoker   . Smokeless tobacco: Never Used  . Alcohol Use: No  . Drug Use: No   Current Outpatient Prescriptions on File Prior to Visit  Medication Sig Dispense Refill  . aspirin EC 325 MG tablet Take 1 tablet (325 mg total) by mouth daily. 30 tablet 0  . bimatoprost (LUMIGAN) 0.03 % ophthalmic solution Place 1 drop into both eyes at bedtime.    . canagliflozin (INVOKANA) 100 MG TABS tablet Take 1 tablet (100 mg total) by mouth daily. 90 tablet 1  . glipiZIDE (GLUCOTROL) 5 MG tablet TAKE 2 TABLETS BY MOUTH 2  TIMES DAILY BEFORE A MEAL. 360 tablet 1  . HYDROmorphone (DILAUDID) 4 MG tablet Take 4 mg by mouth every 4 (four) hours as needed. For kidney stone pain     . LEVEMIR FLEXTOUCH 100 UNIT/ML Pen Inject 15 units in the  morning and 20 units in the evening 45 mL 0  . lisinopril (PRINIVIL,ZESTRIL) 20 MG tablet Take 1 tablet (20 mg total) by mouth daily. 90 tablet 3  . metFORMIN (GLUCOPHAGE-XR) 500 MG 24 hr tablet Take 2 tablets by mouth  twice a day 360 tablet 5  . naproxen (NAPROSYN) 500 MG tablet Take 1 tablet by mouth  twice a day with a meal 180 tablet 4  . NON FORMULARY Take 2 tablets by mouth daily. Diabetic vitamin packet 1 daily    . NON FORMULARY Alpha-lopic acid 200 mg-Take one daily    . Nutritional Supplements (PROSTATE ENZYME FORMULA PO) Take by mouth. Take 2 in pm    . ONE TOUCH ULTRA TEST test strip Use to test blood sugar 4  times daily 400 each 1  . tadalafil (CIALIS) 20 MG tablet Take 1 tablet by mouth  daily as needed for   erectile dysfunction. 6 tablet 5  . tamsulosin (FLOMAX) 0.4 MG CAPS capsule      No current facility-administered medications on file prior to visit.   Allergies  Allergen Reactions  . Demerol Other (See Comments)    Blood pressure  dropped completely out  . Meperidine Hcl Other (See Comments)    Drops blood pressure out  . Nitrospan [Nitroglycerin] Other (See Comments)    Blood pressure dropped out  . Sulfa Antibiotics Other (See Comments)    Urinary burning; blisters    Family History  Problem Relation Age of Onset  . Heart disease Brother     Atrial fibrillation  .  Heart disease Father     CHF  . Heart disease Mother    PE: BP 120/72 mmHg  Pulse 63  Temp(Src) 97.6 F (36.4 C) (Oral)  Resp 12  Wt 211 lb (95.709 kg)  SpO2 96% Body mass index is 25.69 kg/(m^2). Wt Readings from Last 3 Encounters:  07/13/15 211 lb (95.709 kg)  04/13/15 202 lb 12.8 oz (91.989 kg)  02/23/15 207 lb (93.895 kg)   Constitutional: normal weight, in NAD Eyes: PERRLA, EOMI, no exophthalmos ENT: moist mucous membranes, no thyromegaly, no cervical lymphadenopathy Cardiovascular: RRR, No MRG Respiratory: CTA B Gastrointestinal: abdomen soft, NT, ND, BS+ Musculoskeletal: no deformities, strength intact in all 4, right peri-ankle edema Skin: moist, warm, no rashes Neurological: no tremor with outstretched hands, DTR normal (except L patellar - TKR)  ASSESSMENT: 1. DM2, insulin-dependent, uncontrolled, without complications  PLAN:  1. Patient with long-standing, fairly well controlled diabetes, on metformin, Invokana, Glipizide, and basal insulin. Sugars improved, especially before dinner. Will continue same regimen >> add Glipizide with a larger lunch, also: Patient Instructions  Please continue: - Metformin XR 1000 mg 2x a day - Glipizide 10 mg in am before b'fast, 5 mg Glipizide with a larger lunch, and 10 mg before dinner.  - Invokana 100 mg daily in am before b'fast  - Levemir 15 units in am and 20-30 units in pm  Please return in 3 months with your sugar log.   - Continue checking sugars at different times of the day - 3-4 times a day, rotating checks - advised for yearly eye exams >> he is up-to-date - given flu  vaccine at last visit - check HbA1c today >> 7.3% (slightly lower) - Return to clinic in 3 mo with sugar log

## 2015-07-13 NOTE — Patient Instructions (Signed)
Please continue: - Metformin XR 1000 mg 2x a day - Glipizide 10 mg in am before b'fast, 5 mg Glipizide with a larger lunch, and 10 mg before dinner.  - Invokana 100 mg daily in am before b'fast  - Levemir 15 units in am and 20-30 units in pm  Please return in 3 months with your sugar log.

## 2015-07-24 ENCOUNTER — Telehealth: Payer: Self-pay | Admitting: Internal Medicine

## 2015-07-24 MED ORDER — INSULIN PEN NEEDLE 32G X 4 MM MISC: Status: DC

## 2015-07-24 MED ORDER — GLIPIZIDE 5 MG PO TABS: ORAL_TABLET | ORAL | Status: DC

## 2015-07-24 NOTE — Telephone Encounter (Signed)
Nathaniel Thomas,   This is your patient. 

## 2015-07-24 NOTE — Telephone Encounter (Signed)
Patient called stating that he would like his medications refilled   Rx: Glipizide  Needles for quick pen 31G  Pharmacy: [facility]

## 2015-07-27 NOTE — Telephone Encounter (Signed)
E04.9 (ICD-10-CM) - [place] E11.29,R80.9 (ICD-10-CM) - Controlled type 2 diabetes mellitus with

## 2015-08-07 ENCOUNTER — Telehealth: Payer: Self-pay | Admitting: Internal Medicine

## 2015-08-07 MED ORDER — INSULIN PEN NEEDLE 31G X 6 MM MISC: Status: DC

## 2015-08-07 NOTE — Telephone Encounter (Signed)
Sent a new rx for pen needles that are 31 gauge.

## 2015-08-07 NOTE — Progress Notes (Signed)
HPI: FU CAD. Cardiac CT in July of 2012 revealed mild to moderate coronary artery disease. The patient's total coronary artery calcium score was 28.9, which is 42nd percentile for patient's matched age and gender. The calcium is all part of a mixed plaque in the proximal LAD which causes less than 50% stenosis. Nuclear study in December 2014 showed an ejection fraction of 52% and normal perfusion. Since last seen, the patient denies any dyspnea on exertion, orthopnea, PND, pedal edema, palpitations, syncope or chest pain.   Current Outpatient Prescriptions  Medication Sig Dispense Refill  . aspirin EC 325 MG tablet Take 1 tablet (325 mg total) by mouth daily. 30 tablet 0  . bimatoprost (LUMIGAN) 0.03 % ophthalmic solution Place 1 drop into both eyes at bedtime.    . canagliflozin (INVOKANA) 100 MG TABS tablet Take 1 tablet (100 mg total) by mouth daily. 90 tablet 1  . glipiZIDE (GLUCOTROL) 5 MG tablet 10 mg in am before breakfast, 5 mg Glipizide with a larger lunch, and 10 mg before dinner. 450 tablet 1  . HYDROmorphone (DILAUDID) 4 MG tablet Take 4 mg by mouth every 4 (four) hours as needed. For kidney stone pain     . Insulin Pen Needle (CAREFINE PEN NEEDLES) 31G X 6 MM MISC Use to inject insulin 2 times daily as instructed. 200 each 3  . LEVEMIR FLEXTOUCH 100 UNIT/ML Pen Inject 15 units in the  morning and 20 units in the evening 45 mL 0  . lisinopril (PRINIVIL,ZESTRIL) 20 MG tablet Take 1 tablet (20 mg total) by mouth daily. 90 tablet 3  . metFORMIN (GLUCOPHAGE-XR) 500 MG 24 hr tablet Take 2 tablets by mouth  twice a day 360 tablet 5  . naproxen (NAPROSYN) 500 MG tablet Take 1 tablet by mouth  twice a day with a meal 180 tablet 4  . NON FORMULARY Take 2 tablets by mouth daily. Diabetic vitamin packet 1 daily    . NON FORMULARY Alpha-lopic acid 200 mg-Take one daily    . Nutritional Supplements (PROSTATE ENZYME FORMULA PO) Take by mouth. Take 2 in pm    . ONE TOUCH ULTRA TEST test  strip Use to test blood sugar 4  times daily 400 each 1  . tadalafil (CIALIS) 20 MG tablet Take 1 tablet by mouth  daily as needed for   erectile dysfunction. 6 tablet 5  . tamsulosin (FLOMAX) 0.4 MG CAPS capsule Take 0.4 mg by mouth daily after supper.      No current facility-administered medications for this visit.     Past Medical History  Diagnosis Date  . Diabetes mellitus   . Hypertension   . Hyperlipidemia   . Chronic kidney disease   . Renal calculus or stone   . Glaucoma   . CAD (coronary artery disease)   . Skin cancer of nose     ears and hands  . CHICKENPOX, HX OF 08/30/2008    Qualifier: Diagnosis of  By: [name], [name]    . History of urinary calculi 08/30/2008    Qualifier: Diagnosis of  By: [name], Jacelyn Pi     Past Surgical History  Procedure Laterality Date  . Left knee replacement  2002/ 2004  . Tonsillectomy    . Lithotripsy      was cancelled/pt passed stone  . Laminectomy  1983/1988  . Basal cell carcinoma excision      hands  . Ankle surgery  2013/ wears boot cast    Social History   Social History  . Marital Status: Married    Spouse Name: N/A  . Number of Children: 2  . Years of Education: N/A   Occupational History  .      Retired   Social History Main Topics  . Smoking status: Never Smoker   . Smokeless tobacco: Never Used  . Alcohol Use: No  . Drug Use: No  . Sexual Activity: Yes   Other Topics Concern  . Not on file   Social History Narrative    ROS: Some arthralgias but no fevers or chills, productive cough, hemoptysis, dysphasia, odynophagia, melena, hematochezia, dysuria, hematuria, rash, seizure activity, orthopnea, PND, pedal edema, claudication. Remaining systems are negative.  Physical Exam: Well-developed well-nourished in no acute distress.  Skin is warm and dry.  HEENT is normal.  Neck is supple.  Chest is clear to auscultation with normal expansion.  Cardiovascular exam is regular rate and rhythm.    Abdominal exam nontender or distended. No masses palpated. Extremities show no edema. neuro grossly intact  ECG Normal sinus rhythm at a rate of 73. Nonspecific ST changes.

## 2015-08-07 NOTE — Telephone Encounter (Signed)
qwik pen needles need to be 31 gauge

## 2015-08-08 ENCOUNTER — Encounter: Payer: Self-pay | Admitting: Cardiology

## 2015-08-08 ENCOUNTER — Ambulatory Visit (INDEPENDENT_AMBULATORY_CARE_PROVIDER_SITE_OTHER): Payer: PPO | Admitting: Cardiology

## 2015-08-08 VITALS — BP 115/68 | HR 73 | Ht 76.0 in | Wt 205.0 lb

## 2015-08-08 DIAGNOSIS — I2510 Atherosclerotic heart disease of native coronary artery without angina pectoris: Secondary | ICD-10-CM

## 2015-08-08 MED ORDER — ATORVASTATIN CALCIUM 40 MG PO TABS: 40.0000 mg | ORAL_TABLET | Freq: Every day | ORAL | Status: DC

## 2015-08-08 MED ORDER — LISINOPRIL 20 MG PO TABS: 20.0000 mg | ORAL_TABLET | Freq: Every day | ORAL | Status: DC

## 2015-08-08 NOTE — Patient Instructions (Signed)
Medication Instructions:   START ATORVASTATIN 40 MG ONCE DAILY  Labwork:  Your physician recommends that you return for lab work in: 4 WEEKS= DO NOT EAT PRIOR TO LAB WORK  Follow-Up:  Your physician wants you to follow-up in: ONE YEAR WITH DR CRENSHAW You will receive a reminder letter in the mail two months in advance. If you don't receive a letter, please call our office to schedule the follow-up appointment.   If you need a refill on your cardiac medications before your next appointment, please call your pharmacy.    

## 2015-08-08 NOTE — Assessment & Plan Note (Signed)
Blood pressure controlled. Continue present medications. Potassium and renal function monitored by primary care. 

## 2015-08-08 NOTE — Assessment & Plan Note (Signed)
Continue aspirin. Resume statin.

## 2015-08-08 NOTE — Assessment & Plan Note (Signed)
Patient is not taking a statin. I will add Lipitor 40 mg daily given history of coronary disease and diabetes mellitus. Check lipids and liver in 4 weeks.

## 2015-08-09 ENCOUNTER — Other Ambulatory Visit: Payer: Self-pay | Admitting: *Deleted

## 2015-08-09 ENCOUNTER — Telehealth: Payer: Self-pay | Admitting: Internal Medicine

## 2015-08-09 MED ORDER — INSULIN DETEMIR 100 UNIT/ML FLEXPEN: PEN_INJECTOR | SUBCUTANEOUS | Status: DC

## 2015-08-09 NOTE — Telephone Encounter (Signed)
Pt states the rx for the levemir is only for 84 and not 90 please advise

## 2015-08-09 NOTE — Telephone Encounter (Signed)
Corrected dose sent to pharmacy  

## 2015-08-11 ENCOUNTER — Other Ambulatory Visit: Payer: Self-pay | Admitting: Internal Medicine

## 2015-08-15 ENCOUNTER — Encounter: Payer: Self-pay | Admitting: Family Medicine

## 2015-08-16 DIAGNOSIS — R972 Elevated prostate specific antigen [PSA]: Secondary | ICD-10-CM | POA: Diagnosis not present

## 2015-08-16 DIAGNOSIS — N200 Calculus of kidney: Secondary | ICD-10-CM | POA: Diagnosis not present

## 2015-08-16 DIAGNOSIS — N5201 Erectile dysfunction due to arterial insufficiency: Secondary | ICD-10-CM | POA: Diagnosis not present

## 2015-08-18 ENCOUNTER — Encounter: Payer: Self-pay | Admitting: Internal Medicine

## 2015-08-18 ENCOUNTER — Encounter: Payer: Self-pay | Admitting: Cardiology

## 2015-08-21 ENCOUNTER — Other Ambulatory Visit: Payer: Self-pay | Admitting: *Deleted

## 2015-08-21 MED ORDER — PRAVASTATIN SODIUM 40 MG PO TABS: 40.0000 mg | ORAL_TABLET | Freq: Every evening | ORAL | Status: DC

## 2015-08-22 ENCOUNTER — Other Ambulatory Visit: Payer: Self-pay | Admitting: *Deleted

## 2015-08-22 MED ORDER — PITAVASTATIN CALCIUM 4 MG PO TABS: 4.0000 mg | ORAL_TABLET | Freq: Every day | ORAL | Status: DC

## 2015-08-27 ENCOUNTER — Encounter: Payer: Self-pay | Admitting: Family Medicine

## 2015-08-27 ENCOUNTER — Ambulatory Visit (INDEPENDENT_AMBULATORY_CARE_PROVIDER_SITE_OTHER): Payer: PPO | Admitting: Family Medicine

## 2015-08-27 VITALS — BP 130/72 | HR 67 | Temp 97.9°F | Wt 208.0 lb

## 2015-08-27 DIAGNOSIS — IMO0001 Reserved for inherently not codable concepts without codable children: Secondary | ICD-10-CM

## 2015-08-27 DIAGNOSIS — R1031 Right lower quadrant pain: Secondary | ICD-10-CM

## 2015-08-27 DIAGNOSIS — Z23 Encounter for immunization: Secondary | ICD-10-CM | POA: Diagnosis not present

## 2015-08-27 DIAGNOSIS — E1165 Type 2 diabetes mellitus with hyperglycemia: Secondary | ICD-10-CM

## 2015-08-27 DIAGNOSIS — I10 Essential (primary) hypertension: Secondary | ICD-10-CM | POA: Diagnosis not present

## 2015-08-27 DIAGNOSIS — E785 Hyperlipidemia, unspecified: Secondary | ICD-10-CM

## 2015-08-27 DIAGNOSIS — N419 Inflammatory disease of prostate, unspecified: Secondary | ICD-10-CM

## 2015-08-27 DIAGNOSIS — Z20828 Contact with and (suspected) exposure to other viral communicable diseases: Secondary | ICD-10-CM

## 2015-08-27 DIAGNOSIS — E786 Lipoprotein deficiency: Secondary | ICD-10-CM

## 2015-08-27 NOTE — Assessment & Plan Note (Signed)
S: suspect improving controll on pitavastatin 4mg . . No myalgias. Myalgias and sugar elevations on lipitor, pravastatin caused sugar elevations as well. Livalo expensive but does not cause same side effects.   A/P: has upcoming lipids repeated with cardiology. We discussed even though expensive- early being in the donut hole will hopefully help him get out of it soon as well. Continue livalo 4mg 

## 2015-08-27 NOTE — Progress Notes (Signed)
Nathaniel Reddish, MD  Subjective:  Nathaniel Thomas is a 65 y.o. year old very pleasant male patient who presents for/with See problem oriented charting ROS- does have some dysuria that is not worsening, denies fever, nausea, vomiting. Had some chills the other night when having RLQ pain- now resolved. No chest pain or shortness o fbreath  Past Medical History-  Patient Active Problem List   Diagnosis Date Noted  . CAD (coronary artery disease) 03/12/2011    Priority: High  . Diabetes mellitus type 2, uncontrolled, without complications ([place]) 99991111    Priority: High  . BPH (benign prostatic hyperplasia) 07/26/2014    Priority: Medium  . 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 Hyperlipidemia with low HDL 05/02/2009    Priority: Medium  . Essential hypertension 03/27/2009    Priority: Medium  . Chronic ankle pain 04/19/2014    Priority: Low  . Chest pain 06/15/2013    Priority: Low  . OSTEOARTHRITIS, CARPOMETACARPAL JOINT, RIGHT THUMB 09/04/2009    Priority: Low  . GERD 08/30/2008    Priority: Low  . ARTHRITIS 08/30/2008    Priority: Low  . Antalgic gait 05/19/2014    Medications- reviewed and updated Current Outpatient Prescriptions  Medication Sig Dispense Refill  . aspirin EC 325 MG tablet Take 1 tablet (325 mg total) by mouth daily. 30 tablet 0  . bimatoprost (LUMIGAN) 0.03 % ophthalmic solution Place 1 drop into both eyes at bedtime.    . canagliflozin (INVOKANA) 100 MG TABS tablet Take 1 tablet (100 mg total) by mouth daily. 90 tablet 1  . glipiZIDE (GLUCOTROL) 5 MG tablet Take 2 tablets in the AM before breakfast, 1 tablet before larger lunch and 2 tablets before dinner. 450 tablet 1  . Insulin Detemir (LEVEMIR FLEXTOUCH) 100 UNIT/ML Pen Inject 15 units in the  morning and 30 units in the evening 45 mL 0  . Insulin Pen Needle (CAREFINE PEN NEEDLES) 31G X 6 MM MISC Use to inject insulin 2 times daily as instructed. 200 each 3  . lisinopril  (PRINIVIL,ZESTRIL) 20 MG tablet Take 1 tablet (20 mg total) by mouth daily. 90 tablet 3  . metFORMIN (GLUCOPHAGE-XR) 500 MG 24 hr tablet Take 2 tablets by mouth  twice a day 360 tablet 5  . naproxen (NAPROSYN) 500 MG tablet Take 1 tablet by mouth  twice a day with a meal 180 tablet 4  . NON FORMULARY Take 2 tablets by mouth daily. Diabetic vitamin packet 1 daily    . NON FORMULARY Alpha-lopic acid 200 mg-Take one daily    . Nutritional Supplements (PROSTATE ENZYME FORMULA PO) Take by mouth. Take 2 in pm    . ONE TOUCH ULTRA TEST test strip Use to test blood sugar 4  times daily 400 each 1  . Pitavastatin Calcium 4 MG TABS Take 1 tablet (4 mg total) by mouth daily. 90 tablet 3  . tamsulosin (FLOMAX) 0.4 MG CAPS capsule Take 0.4 mg by mouth daily after supper.     [name] HYDROmorphone (DILAUDID) 4 MG tablet Take 4 mg by mouth every 4 (four) hours as needed. Reported on 08/27/2015    . tadalafil (CIALIS) 20 MG tablet Take 1 tablet by mouth  daily as needed for   erectile dysfunction. (Patient not taking: Reported on 08/27/2015) 6 tablet 5   No current facility-administered medications for this visit.    Objective: BP 130/72 mmHg  Pulse 67  Temp(Src) 97.9 F (36.6 C)  Wt 208  lb (94.348 kg) Gen: NAD, resting comfortably CV: RRR no murmurs rubs or gallops Lungs: CTAB no crackles, wheeze, rhonchi Abdomen: soft/nontender/nondistended/normal bowel sounds. No rebound or guarding.  No pain in area that he reported prior pain. No obvious hernia.  Ext: no edema Skin: warm, dry Neuro: grossly normal, moves all extremities  Diabetic Foot Exam - Simple   Simple Foot Form  Diabetic Foot exam was performed with the following findings:  Yes 08/27/2015  9:58 AM  Visual Inspection  No deformities, no ulcerations, no other skin breakdown bilaterally:  Yes  Sensation Testing  Intact to touch and monofilament testing bilaterally:  Yes  Pulse Check  Posterior Tibialis and Dorsalis pulse intact bilaterally:  Yes   Comments       Assessment/Plan:  Hyperlipidemia with low HDL S: suspect improving controll on pitavastatin 4mg . . No myalgias. Myalgias and sugar elevations on lipitor, pravastatin caused sugar elevations as well. Livalo expensive but does not cause same side effects.   A/P: has upcoming lipids repeated with cardiology. We discussed even though expensive- early being in the donut hole will hopefully help him get out of it soon as well. Continue livalo 4mg      Essential hypertension S: controlled. On 1/2 of lisinopril 20mg - better control since being on invokana  BP Readings from Last 3 Encounters:  08/27/15 130/72  08/08/15 115/68  07/13/15 120/72  A/P:Continue current medications   Diabetes mellitus type 2, uncontrolled, without complications ([name]) S: continues to follow with Dr. Cruzita Lederer. He asks me about exercise regimen and foot exam A/P: foot exam normal today including monofilament and pulses. He has [employer] and going 3x a week. I agreed that his regular exercise plan could certainly help with his diabetes in conjunction with compliance with medicationand regular follow up with Dr. Cruzita Lederer.   Prostatitis/dysuria S: Followed by urology. Dr. Arman Bogus treated for prostatitis. Bactrim- Urinary burning, blisters on outside of penis. Placed on another medicine - was not supposed to be used with lisinopril so stopped. Stopped another medicine as he did not like side effect profile. Taking essential oils twice a day- frankincense and myrrh and cinnamon bark for sugar.  A/P: I advised patient to seek urology follow up. What he is treating with is unlikely to cure underlying infection. He is resistant but returning in april for psa and agrees to return sooner if worsening symptoms or fever. He had also mentioned kidney pain to me in a mychart message and states no longer having issues with this  Right lower quadrant abdominal pain S:Pain in right groin in area of "fatty cyst" per  [name] several nights ago. This pain is really in right lower quadrant. In right and left lower abdomen he does have some sagging skin in area where he has given a lot of insulin shots but has not in last 2 months (doing in legs now). Pain lasted 10-15 minutes but was severe then went away on its own. Was shaky- worried about low sugar but was not low. Had recurrence for less than 5 minutes last night.  A/P: Unclear etiology, no obvious hernia on exam. Doubt appendicitis with no fever/chills. Wonder if gas or constipation could contribute. We discussed a watchful waiting approach- if increases in frequency or intensity could reexamine or he could call in for CT scan abd/pelvis.   6 months CPE Return precautions advised.   Patient wanted full updated blood work so added to [employer] near [employer] that he was already getting through  cards (lipids, lft) Orders Placed This Encounter  Procedures  . Pneumococcal conjugate vaccine 13-valent  . CBC    Standing Status: Future     Number of Occurrences:      Standing Expiration Date: 08/26/2016  . Basic metabolic panel    Standing Status: Future     Number of Occurrences:      Standing Expiration Date: 08/26/2016  . HIV antibody    Standing Status: Future     Number of Occurrences:      Standing Expiration Date: 08/26/2016  . Hepatitis C antibody, reflex    [facility]    Standing Status: Future     Number of Occurrences:      Standing Expiration Date: 08/26/2016

## 2015-08-27 NOTE — Assessment & Plan Note (Signed)
S: continues to follow with Dr. Cruzita Lederer. He asks me about exercise regimen and foot exam A/P: foot exam normal today including monofilament and pulses. He has [employer] and going 3x a week. I agreed that his regular exercise plan could certainly help with his diabetes in conjunction with compliance with medicationand regular follow up with Dr. Cruzita Lederer.

## 2015-08-27 NOTE — Patient Instructions (Addendum)
Prevnar13 received today.  Foot exam normal  Blood pressure looks great  Return to urology if worsening symptoms in regards to burning with urination or if you have fever  For pain in right lower abdomen- if increases in frequency or intensity- please return to see [place]  I added on labs to have full set done when you go to [facility]- please make sure they do these in addition to lipid panel and liver function tests cardiology already ordered  Health Maintenance Due  Topic Date Due  . Hepatitis C Screening -- today 12-21-1950  . HIV Screening - today 11/[birth year]/1964  . FOOT EXAM - today 04/27/2015  . TETANUS/TDAP - insurance doesn't cover, would need this if you get a cut/wound 08/05/2015

## 2015-08-27 NOTE — Assessment & Plan Note (Signed)
S: controlled. On 1/2 of lisinopril 20mg - better control since being on invokana  BP Readings from Last 3 Encounters:  08/27/15 130/72  08/08/15 115/68  07/13/15 120/72  A/P:Continue current medications

## 2015-09-03 ENCOUNTER — Encounter: Payer: Self-pay | Admitting: Cardiology

## 2015-09-12 ENCOUNTER — Encounter: Payer: Self-pay | Admitting: Internal Medicine

## 2015-09-12 ENCOUNTER — Other Ambulatory Visit: Payer: Self-pay | Admitting: Internal Medicine

## 2015-09-12 DIAGNOSIS — E1165 Type 2 diabetes mellitus with hyperglycemia: Principal | ICD-10-CM

## 2015-09-12 DIAGNOSIS — Z794 Long term (current) use of insulin: Principal | ICD-10-CM

## 2015-09-12 DIAGNOSIS — IMO0001 Reserved for inherently not codable concepts without codable children: Secondary | ICD-10-CM

## 2015-09-12 MED ORDER — INSULIN ASPART 100 UNIT/ML FLEXPEN: 5.0000 [IU] | PEN_INJECTOR | Freq: Three times a day (TID) | SUBCUTANEOUS | Status: DC

## 2015-10-11 ENCOUNTER — Ambulatory Visit (INDEPENDENT_AMBULATORY_CARE_PROVIDER_SITE_OTHER): Payer: PPO | Admitting: Internal Medicine

## 2015-10-11 ENCOUNTER — Other Ambulatory Visit (INDEPENDENT_AMBULATORY_CARE_PROVIDER_SITE_OTHER): Payer: PPO | Admitting: *Deleted

## 2015-10-11 ENCOUNTER — Encounter: Payer: Self-pay | Admitting: Internal Medicine

## 2015-10-11 VITALS — BP 122/80 | HR 75 | Temp 97.7°F | Resp 12 | Wt 201.0 lb

## 2015-10-11 DIAGNOSIS — Z794 Long term (current) use of insulin: Secondary | ICD-10-CM

## 2015-10-11 DIAGNOSIS — IMO0001 Reserved for inherently not codable concepts without codable children: Secondary | ICD-10-CM

## 2015-10-11 DIAGNOSIS — E1165 Type 2 diabetes mellitus with hyperglycemia: Secondary | ICD-10-CM

## 2015-10-11 LAB — POCT GLYCOSYLATED HEMOGLOBIN (HGB A1C): Hemoglobin A1C: 7.3

## 2015-10-11 NOTE — Patient Instructions (Signed)
Please continue: - Metformin XR 1000 mg 2x a day - Glipizide 10 mg in am before b'fast, 5 mg Glipizide with a lunch, and 10 mg before dinner.  - Invokana 100 mg daily in am before b'fast  - Levemir 20 units in am and 25-30 units in pm  Please return in 3 months with your sugar log.   Please call and schedule an eye appt with Dr. Prudencio Burly: [facility]:  Dr. Sherlyn Lick MD ?  Address: [address] [place], [place], [place] [address]  [phone]

## 2015-10-11 NOTE — Progress Notes (Signed)
Patient ID: Nathaniel Thomas, male   DOB: 10-04-1950, 65 y.o.   MRN: [id number]  HPI: Nathaniel Thomas is a 65 y.o.-year-old male, returning for f/u for DM2, dx 1999, insulin-dependent since 2008, uncontrolled, without complications. Last visit 3 mo ago.  Last hemoglobin A1c was: Lab Results  Component Value Date   HGBA1C 7.3 07/13/2015   HGBA1C 7.5 04/13/2015   HGBA1C 7.5* 01/11/2015  He had a steroid inj and Prednisone taper in 07/2014.  Pt is on a regimen of: - Metformin XR 1000 mg po bid - Glipizide 10 mg in am before b'fast, 5 mg before lunch (with a larger lunch), and 10 mg before dinner.  - Invokana 100 mg daily in am before b'fast - started 12/2014 - Levemir 20 units in am and 25-30 units in pm We stopped Humalog 75/25 15 in am, 10-20 at lunch, 8-20 at dinner  Pt checks his sugars 3-4 x a day - better after starting Livalo: - am: 73, 82-138 >> 134-185, 204 >> 109-160, 172 >> 76, 91-140 >> 71, 91-150,170 >> 75-153, 183 >> 75-143, 161, 174 - 2h after b'fast: 149-211 >> 132-168 >> 177-195 >> 152-211, 235x1(cereals) >> 127-185, 236, 280 >> 115-194, 235 - before lunch:80-142, 155 >> 75-160, 196 >> 72-154 >> 71, 82-135 >> 86-135, 250x1 >> 80-133, 166 >> 69, 79-163 - 2h after lunch: 176 >> 88-164, 213 >> 141-194, 282 >> 100, 150 >> 160-175 >> 162-206, 220 >> 149-244, 288 - before dinner: 103-197, 225 >> 130-257 >> 88-149 >> 143- 205, 254 (higher 2/2 prostatitis) >> 98-144 >> 78-183, 228 - 2h after dinner: 83-185, 231 (grapes), 236 >> 94-193 >> 56, 212-334 >> n/c >> 180-205 >> 156, 176, 178 >> before livalo: 191-335 (no checks after livalo) - nighttime: 49-59 >> 101-167, 195 >> 120-153, 185 >> 107-207, higher with prostatitis  No lows. Lowest sugar was 71 >> 69 x1;  he has hypoglycemia awareness at 60.  Highest sugar was 236 >> 225 >> 205 >> high 200s >> 338 >> 335.   Pt's meals are: - Breakfast: [place] bacon, 1-2 eggs, butter; protein shake 3x a week; toast - Lunch: salad - large;  rotisserie chicken or cheese - Dinner: chicken chilli, brown rice  - Snacks: fruits, fruit cake; nuts; black bean chips  He exercises 2x times a week by doing cardio and lifting weights.  - no CKD, last BUN/creatinine:  Lab Results  Component Value Date   BUN 20 01/11/2015   CREATININE 0.89 01/11/2015  On Lisinopril - last set of lipids: Lab Results  Component Value Date   CHOL 136 04/13/2014   HDL 27.60* 04/13/2014   LDLCALC 89 04/13/2014   LDLDIRECT 113.0 02/23/2015   TRIG 98.0 04/13/2014   CHOLHDL 5 04/13/2014  On Pitavastatin (Livalo) - started 08/27/2015, switching from Lipitor, then Pravastatin. - last eye exam was in 04/19/2015. No DR. + glaucoma.  - no numbness and tingling in his feet.  ROS: Constitutional: no weight gain/loss, no fatigue, no subjective hyperthermia/hypothermia; + nocturia, passed few stones, + burning with urination Eyes: no blurry vision, no xerophthalmia ENT: no sore throat, no nodules palpated in throat, no dysphagia/odynophagia, no hoarseness Cardiovascular: no CP/SOB/palpitations/leg swelling Respiratory:no cough/no SOB Gastrointestinal: no N/V/D/C Musculoskeletal: no muscle/joint aches Skin: no rash Neurological: no tremors/numbness/tingling/dizziness  I reviewed pt's medications, allergies, PMH, social hx, family hx, and changes were documented in the history of present illness. Otherwise, unchanged from my initial visit note:  Past Medical History  Diagnosis Date  .  Diabetes mellitus   . Hypertension   . Hyperlipidemia   . Chronic kidney disease   . Renal calculus or stone   . Glaucoma   . CAD (coronary artery disease)   . Skin cancer of nose     ears and hands  . [place], HX OF 08/30/2008    Qualifier: Diagnosis of  By: [name], [name]    . History of urinary calculi 08/30/2008    Qualifier: Diagnosis of  By: [name], Jacelyn Pi    Past Surgical History  Procedure Laterality Date  . Left knee replacement  2002/ 2004  .  Tonsillectomy    . Lithotripsy      was cancelled/pt passed stone  . Laminectomy  1983/1988  . Basal cell carcinoma excision      hands  . Ankle surgery      2013/ wears boot cast   History   Social History  . Marital Status: Married    Spouse Name: N/A    Number of Children: 2   Occupational History  .      Retired   Social History Main Topics  . Smoking status: Never Smoker   . Smokeless tobacco: Never Used  . Alcohol Use: No  . Drug Use: No   Current Outpatient Prescriptions on File Prior to Visit  Medication Sig Dispense Refill  . aspirin EC 325 MG tablet Take 1 tablet (325 mg total) by mouth daily. 30 tablet 0  . bimatoprost (LUMIGAN) 0.03 % ophthalmic solution Place 1 drop into both eyes at bedtime.    . canagliflozin (INVOKANA) 100 MG TABS tablet Take 1 tablet (100 mg total) by mouth daily. 90 tablet 1  . glipiZIDE (GLUCOTROL) 5 MG tablet Take 2 tablets in the AM before breakfast, 1 tablet before larger lunch and 2 tablets before dinner. 450 tablet 1  . HYDROmorphone (DILAUDID) 4 MG tablet Take 4 mg by mouth every 4 (four) hours as needed. Reported on 08/27/2015    . insulin aspart (NOVOLOG FLEXPEN) 100 UNIT/ML FlexPen Inject 5-8 Units into the skin 3 (three) times daily before meals. 15 mL 2  . Insulin Detemir (LEVEMIR FLEXTOUCH) 100 UNIT/ML Pen Inject 15 units in the  morning and 30 units in the evening 45 mL 0  . Insulin Pen Needle (CAREFINE PEN NEEDLES) 31G X 6 MM MISC Use to inject insulin 2 times daily as instructed. 200 each 3  . lisinopril (PRINIVIL,ZESTRIL) 20 MG tablet Take 1 tablet (20 mg total) by mouth daily. 90 tablet 3  . metFORMIN (GLUCOPHAGE-XR) 500 MG 24 hr tablet Take 2 tablets by mouth  twice a day 360 tablet 5  . naproxen (NAPROSYN) 500 MG tablet Take 1 tablet by mouth  twice a day with a meal 180 tablet 4  . NON FORMULARY Take 2 tablets by mouth daily. Diabetic vitamin packet 1 daily    . NON FORMULARY Alpha-lopic acid 200 mg-Take one daily    .  Nutritional Supplements (PROSTATE ENZYME FORMULA PO) Take by mouth. Take 2 in pm    . ONE TOUCH ULTRA TEST test strip Use to test blood sugar 4  times daily 400 each 1  . Pitavastatin Calcium 4 MG TABS Take 1 tablet (4 mg total) by mouth daily. 90 tablet 3  . tadalafil (CIALIS) 20 MG tablet Take 1 tablet by mouth  daily as needed for   erectile dysfunction. 6 tablet 5  . tamsulosin (FLOMAX) 0.4 MG CAPS capsule Take 0.4 mg by mouth  daily after supper.      No current facility-administered medications on file prior to visit.   Allergies  Allergen Reactions  . Demerol Other (See Comments)    Blood pressure dropped completely out  . Meperidine Hcl Other (See Comments)    Drops blood pressure out  . Nitrospan [Nitroglycerin] Other (See Comments)    Blood pressure dropped out  . Sulfa Antibiotics Other (See Comments)    Urinary burning; blisters    Family History  Problem Relation Age of Onset  . Heart disease Brother     Atrial fibrillation  . Heart disease Father     CHF  . Heart disease Mother    PE: BP 122/80 mmHg  Pulse 75  Temp(Src) 97.7 F (36.5 C) (Oral)  Resp 12  Wt 201 lb (91.173 kg)  SpO2 96% Body mass index is 24.48 kg/(m^2). Wt Readings from Last 3 Encounters:  10/11/15 201 lb (91.173 kg)  08/27/15 208 lb (94.348 kg)  08/08/15 205 lb (92.987 kg)   Constitutional: normal weight, in NAD Eyes: PERRLA, EOMI, no exophthalmos ENT: moist mucous membranes, no thyromegaly, no cervical lymphadenopathy Cardiovascular: RRR, No MRG Respiratory: CTA B Gastrointestinal: abdomen soft, NT, ND, BS+ Musculoskeletal: no deformities, strength intact in all 4 Skin: moist, warm, no rashes Neurological: no tremor with outstretched hands, DTR normal (except L patellar - TKR)  ASSESSMENT: 1. DM2, insulin-dependent, uncontrolled, without complications  PLAN:  1. Patient with long-standing, fairly well controlled diabetes, on metformin, Invokana, Glipizide, and basal insulin. Sugars  improved after switching to Livalo. Will continue same regimen:  Patient Instructions  Please continue: - Metformin XR 1000 mg 2x a day - Glipizide 10 mg in am before b'fast, 5 mg Glipizide with a lunch, and 10 mg before dinner.  - Invokana 100 mg daily in am before b'fast  - Levemir 20 units in am and 25-30 units in pm  Please return in 3 months with your sugar log.   Please call and schedule an eye appt with Dr. Prudencio Burly: [facility]:  Dr. Sherlyn Lick MD ?  Address: [place], [place], [place] [address]  [phone]  - Continue checking sugars at different times of the day - 3-4 times a day, rotating checks - advised for yearly eye exams >> he is up-to-date >> needs a new eye Dr as previous not in network - given flu vaccine this season - check HbA1c today >> 7.3% (stable) - Return to clinic in 3 mo with sugar log

## 2015-10-20 ENCOUNTER — Encounter: Payer: Self-pay | Admitting: Internal Medicine

## 2015-10-22 ENCOUNTER — Other Ambulatory Visit: Payer: Self-pay

## 2015-10-22 MED ORDER — INSULIN DETEMIR 100 UNIT/ML FLEXPEN: PEN_INJECTOR | SUBCUTANEOUS | Status: DC

## 2015-10-31 ENCOUNTER — Encounter: Payer: Self-pay | Admitting: *Deleted

## 2015-11-02 ENCOUNTER — Encounter: Payer: Self-pay | Admitting: Internal Medicine

## 2015-11-02 ENCOUNTER — Encounter: Payer: Self-pay | Admitting: *Deleted

## 2015-11-02 NOTE — Telephone Encounter (Signed)
I messaged pt and advised him that you were out of town and will not return until [place]. Advised him that you will reply once you return. Be advised.

## 2015-11-06 ENCOUNTER — Encounter: Payer: Self-pay | Admitting: *Deleted

## 2015-11-06 ENCOUNTER — Other Ambulatory Visit: Payer: Self-pay | Admitting: *Deleted

## 2015-11-06 ENCOUNTER — Encounter: Payer: Self-pay | Admitting: Internal Medicine

## 2015-11-06 MED ORDER — CANAGLIFLOZIN 100 MG PO TABS: 100.0000 mg | ORAL_TABLET | Freq: Every day | ORAL | Status: DC

## 2015-11-06 NOTE — Telephone Encounter (Signed)
I refilled pt's Invokana. Be advised.

## 2015-11-07 DIAGNOSIS — R972 Elevated prostate specific antigen [PSA]: Secondary | ICD-10-CM | POA: Diagnosis not present

## 2015-11-09 DIAGNOSIS — I2510 Atherosclerotic heart disease of native coronary artery without angina pectoris: Secondary | ICD-10-CM | POA: Diagnosis not present

## 2015-11-10 LAB — HEPATIC FUNCTION PANEL
ALT: 26 U/L (ref 9–46)
AST: 23 U/L (ref 10–35)
Albumin: 4.5 g/dL (ref 3.6–5.1)
Alkaline Phosphatase: 45 U/L (ref 40–115)
Bilirubin, Direct: 0.1 mg/dL (ref <=0.2)
Indirect Bilirubin: 0.5 mg/dL (ref 0.2–1.2)
Total Bilirubin: 0.6 mg/dL (ref 0.2–1.2)
Total Protein: 7.1 g/dL (ref 6.1–8.1)

## 2015-11-10 LAB — LIPID PANEL
Cholesterol: 128 mg/dL (ref 125–200)
HDL: 36 mg/dL — ABNORMAL LOW (ref >=40)
LDL Cholesterol: 69 mg/dL (ref <130)
Total CHOL/HDL Ratio: 3.6 Ratio (ref <=5.0)
Triglycerides: 117 mg/dL (ref <150)
VLDL: 23 mg/dL (ref <30)

## 2015-11-15 ENCOUNTER — Encounter: Payer: Self-pay | Admitting: Family Medicine

## 2015-11-15 ENCOUNTER — Other Ambulatory Visit: Payer: Self-pay | Admitting: Family Medicine

## 2015-11-15 DIAGNOSIS — Z0000 Encounter for general adult medical examination without abnormal findings: Secondary | ICD-10-CM

## 2015-11-15 NOTE — Telephone Encounter (Signed)
Doesn't see me until July. Will be in time of transition- probably easier to give Td shot now on your nurse schedule. In regards to Hep C- you can order this for now if he wants to go ahead and do it here.

## 2015-11-27 ENCOUNTER — Telehealth: Payer: Self-pay | Admitting: Internal Medicine

## 2015-11-27 DIAGNOSIS — Z0000 Encounter for general adult medical examination without abnormal findings: Secondary | ICD-10-CM | POA: Diagnosis not present

## 2015-11-27 DIAGNOSIS — R972 Elevated prostate specific antigen [PSA]: Secondary | ICD-10-CM | POA: Diagnosis not present

## 2015-11-27 MED ORDER — GLIPIZIDE 5 MG PO TABS: ORAL_TABLET | ORAL | Status: DC

## 2015-11-27 NOTE — Telephone Encounter (Signed)
Refill sent to [facility].

## 2015-11-27 NOTE — Telephone Encounter (Signed)
Pt needs glipizide called [facility]

## 2015-12-13 ENCOUNTER — Encounter: Payer: Self-pay | Admitting: *Deleted

## 2015-12-13 DIAGNOSIS — Z0100 Encounter for examination of eyes and vision without abnormal findings: Secondary | ICD-10-CM | POA: Diagnosis not present

## 2015-12-13 DIAGNOSIS — H401111 Primary open-angle glaucoma, right eye, mild stage: Secondary | ICD-10-CM | POA: Diagnosis not present

## 2015-12-13 DIAGNOSIS — H401122 Primary open-angle glaucoma, left eye, moderate stage: Secondary | ICD-10-CM | POA: Diagnosis not present

## 2015-12-13 DIAGNOSIS — E119 Type 2 diabetes mellitus without complications: Secondary | ICD-10-CM | POA: Diagnosis not present

## 2015-12-13 LAB — HM DIABETES EYE EXAM

## 2015-12-17 ENCOUNTER — Other Ambulatory Visit: Payer: Self-pay | Admitting: *Deleted

## 2015-12-17 ENCOUNTER — Encounter: Payer: Self-pay | Admitting: Family Medicine

## 2015-12-17 MED ORDER — NAPROXEN 500 MG PO TABS: ORAL_TABLET | ORAL | Status: DC

## 2015-12-19 ENCOUNTER — Other Ambulatory Visit: Payer: PPO

## 2015-12-19 ENCOUNTER — Ambulatory Visit (INDEPENDENT_AMBULATORY_CARE_PROVIDER_SITE_OTHER): Payer: PPO | Admitting: Family Medicine

## 2015-12-19 DIAGNOSIS — Z23 Encounter for immunization: Secondary | ICD-10-CM | POA: Diagnosis not present

## 2015-12-19 DIAGNOSIS — Z0000 Encounter for general adult medical examination without abnormal findings: Secondary | ICD-10-CM | POA: Diagnosis not present

## 2015-12-19 DIAGNOSIS — Z20828 Contact with and (suspected) exposure to other viral communicable diseases: Secondary | ICD-10-CM | POA: Diagnosis not present

## 2015-12-19 DIAGNOSIS — I10 Essential (primary) hypertension: Secondary | ICD-10-CM

## 2015-12-19 LAB — CBC WITH DIFFERENTIAL/PLATELET
Basophils Absolute: 0 10*3/uL (ref 0.0–0.1)
Basophils Relative: 0.7 % (ref 0.0–3.0)
Eosinophils Absolute: 0.2 10*3/uL (ref 0.0–0.7)
Eosinophils Relative: 2.4 % (ref 0.0–5.0)
HCT: 39.6 % (ref 39.0–52.0)
Hemoglobin: 13.2 g/dL (ref 13.0–17.0)
Lymphocytes Relative: 20.3 % (ref 12.0–46.0)
Lymphs Abs: 1.4 10*3/uL (ref 0.7–4.0)
MCHC: 33.3 g/dL (ref 30.0–36.0)
MCV: 87.8 fl (ref 78.0–100.0)
Monocytes Absolute: 0.5 10*3/uL (ref 0.1–1.0)
Monocytes Relative: 7.5 % (ref 3.0–12.0)
Neutro Abs: 4.6 10*3/uL (ref 1.4–7.7)
Neutrophils Relative %: 69.1 % (ref 43.0–77.0)
Platelets: 261 10*3/uL (ref 150.0–400.0)
RBC: 4.51 Mil/uL (ref 4.22–5.81)
RDW: 15 % (ref 11.5–15.5)
WBC: 6.7 10*3/uL (ref 4.0–10.5)

## 2015-12-19 LAB — BASIC METABOLIC PANEL
BUN: 22 mg/dL (ref 6–23)
CO2: 28 mEq/L (ref 19–32)
Calcium: 9.3 mg/dL (ref 8.4–10.5)
Chloride: 104 mEq/L (ref 96–112)
Creatinine, Ser: 1.06 mg/dL (ref 0.40–1.50)
GFR: 74.27 mL/min (ref >60.00)
Glucose, Bld: 239 mg/dL — ABNORMAL HIGH (ref 70–99)
Potassium: 5 mEq/L (ref 3.5–5.1)
Sodium: 137 mEq/L (ref 135–145)

## 2015-12-20 LAB — HIV ANTIBODY (ROUTINE TESTING W REFLEX): HIV 1&2 Ab, 4th Generation: NONREACTIVE

## 2015-12-20 LAB — HEPATITIS C ANTIBODY: HCV Ab: NEGATIVE

## 2016-01-07 ENCOUNTER — Telehealth: Payer: Self-pay | Admitting: Family Medicine

## 2016-01-07 MED ORDER — INSULIN DETEMIR 100 UNIT/ML FLEXPEN: PEN_INJECTOR | SUBCUTANEOUS | Status: DC

## 2016-01-07 NOTE — Telephone Encounter (Signed)
Patient states he needs to change the amount of Levemeir he is taking to 50 units and send it to [facility].  He is using more than what the prescription calls for and he will run out.

## 2016-01-07 NOTE — Telephone Encounter (Signed)
Change made and refill sent to pharmacy.

## 2016-01-08 ENCOUNTER — Encounter: Payer: Self-pay | Admitting: Internal Medicine

## 2016-01-22 ENCOUNTER — Ambulatory Visit (INDEPENDENT_AMBULATORY_CARE_PROVIDER_SITE_OTHER): Payer: PPO | Admitting: Internal Medicine

## 2016-01-22 ENCOUNTER — Encounter: Payer: Self-pay | Admitting: Internal Medicine

## 2016-01-22 VITALS — BP 150/78 | HR 77 | Ht 76.0 in | Wt 199.2 lb

## 2016-01-22 DIAGNOSIS — Z794 Long term (current) use of insulin: Secondary | ICD-10-CM

## 2016-01-22 DIAGNOSIS — E1165 Type 2 diabetes mellitus with hyperglycemia: Secondary | ICD-10-CM | POA: Diagnosis not present

## 2016-01-22 DIAGNOSIS — IMO0001 Reserved for inherently not codable concepts without codable children: Secondary | ICD-10-CM

## 2016-01-22 LAB — POCT GLYCOSYLATED HEMOGLOBIN (HGB A1C): Hemoglobin A1C: 7.2

## 2016-01-22 NOTE — Addendum Note (Signed)
Addended by: Caprice Beaver T on: 01/22/2016 11:29 AM   Modules accepted: Orders

## 2016-01-22 NOTE — Progress Notes (Signed)
Patient ID: Nathaniel Thomas, male   DOB: 1950/12/05, 65 y.o.   MRN: [id number]  HPI: Nathaniel Thomas is a 65 y.o.-year-old male, returning for f/u for DM2, dx 1999, insulin-dependent since 2008, uncontrolled, without complications. Last visit 3 mo ago.  He has a lot of stress at home - daughter and her children lives with them.  Last hemoglobin A1c was: Lab Results  Component Value Date   HGBA1C 7.3 10/11/2015   HGBA1C 7.3 07/13/2015   HGBA1C 7.5 04/13/2015  He had a steroid inj and Prednisone taper in 07/2014.  Pt is on a regimen of: - Metformin XR 1000 mg po bid - Glipizide 10 mg in am before b'fast, 5 mg before lunch (with a larger lunch), and 10 mg before dinner.  - Invokana 100 mg daily in am before b'fast - started 12/2014 - Levemir 20 units in am and 25-30 units in pm We stopped Humalog 75/25 15 in am, 10-20 at lunch, 8-20 at dinner  Pt checks his sugars 3-4 x a day - better after starting Livalo: - am: 134-185, 204 >> 109-160, 172 >> 76, 91-140 >> 71, 91-150,170 >> 75-153, 183 >> 75-143, 161, 174 >> 64, 71-138, 181 - 2h after b'fast: 132-168 >> 177-195 >> 152-211, 235x1(cereals) >> 127-185, 236, 280 >> 115-194, 235 >> 125-187 - before lunch: 75-160, 196 >> 72-154 >> 71, 82-135 >> 86-135, 250x1 >> 80-133, 166 >> 69, 79-163 >> 69-130 - 2h after lunch: 88-164, 213 >> 141-194, 282 >> 100, 150 >> 160-175 >> 162-206, 220 >> 149-244, 288 >> 108-190 - before dinner: 130-257 >> 88-149 >> 143- 205, 254 (higher 2/2 prostatitis) >> 98-144 >> 78-183, 228 >> 97-142, 180 - 2h after dinner: 94-193 >> 56, 212-334 >> n/c >> 180-205 >> 156, 176, 178 >> 191-335 >> 173-349 - nighttime: 49-59 >> 101-167, 195 >> 120-153, 185 >> 107-207, higher with prostatitis  >> n/c No lows. Lowest sugar was 71 >> 69 x1 >> 64;  he has hypoglycemia awareness at 60.  Highest sugar was 236 >> 225 >> 205 >> high 200s >> 338 >> 335 >> 349.   Pt's meals are: - Breakfast: [place] bacon, 1-2 eggs, butter; protein shake 3x a  week; toast - Lunch: salad - large; rotisserie chicken or cheese - Dinner: chicken chilli, brown rice  - Snacks: fruits, fruit cake; nuts; black bean chips  He exercises 2x times a week by doing cardio and lifting weights.  - no CKD, last BUN/creatinine:  Lab Results  Component Value Date   BUN 22 12/19/2015   CREATININE 1.06 12/19/2015  On Lisinopril - last set of lipids: Lab Results  Component Value Date   CHOL 128 11/09/2015   HDL 36* 11/09/2015   LDLCALC 69 11/09/2015   LDLDIRECT 113.0 02/23/2015   TRIG 117 11/09/2015   CHOLHDL 3.6 11/09/2015  On Pitavastatin (Livalo) - started 08/27/2015, switching from Lipitor, then Pravastatin. - last eye exam was in 12/13/2015. No DR. + glaucoma. He will have cataract sx. Dr. Prudencio Burly. - no numbness and tingling in his feet.  ROS: Constitutional: no weight gain/loss, no fatigue, no subjective hyperthermia/hypothermia; + nocturia, passed few stones, + burning with urination Eyes: no blurry vision, no xerophthalmia ENT: no sore throat, no nodules palpated in throat, no dysphagia/odynophagia, no hoarseness Cardiovascular: no CP/SOB/palpitations/leg swelling Respiratory:no cough/no SOB Gastrointestinal: no N/V/D/C Musculoskeletal: no muscle/joint aches Skin: no rash Neurological: no tremors/numbness/tingling/dizziness  I reviewed pt's medications, allergies, PMH, social hx, family hx, and changes were documented  in the history of present illness. Otherwise, unchanged from my initial visit note:  Past Medical History  Diagnosis Date  . Diabetes mellitus   . Hypertension   . Hyperlipidemia   . Chronic kidney disease   . Renal calculus or stone   . Glaucoma   . CAD (coronary artery disease)   . Skin cancer of nose     ears and hands  . CHICKENPOX, HX OF 08/30/2008    Qualifier: Diagnosis of  By: [name], [name]    . History of urinary calculi 08/30/2008    Qualifier: Diagnosis of  By: [name], Jacelyn Pi    Past Surgical History   Procedure Laterality Date  . Left knee replacement  2002/ 2004  . Tonsillectomy    . Lithotripsy      was cancelled/pt passed stone  . Laminectomy  1983/1988  . Basal cell carcinoma excision      hands  . Ankle surgery      2013/ wears boot cast   History   Social History  . Marital Status: Married    Spouse Name: N/A    Number of Children: 2   Occupational History  .      Retired   Social History Main Topics  . Smoking status: Never Smoker   . Smokeless tobacco: Never Used  . Alcohol Use: No  . Drug Use: No   Current Outpatient Prescriptions on File Prior to Visit  Medication Sig Dispense Refill  . aspirin EC 325 MG tablet Take 1 tablet (325 mg total) by mouth daily. 30 tablet 0  . bimatoprost (LUMIGAN) 0.03 % ophthalmic solution Place 1 drop into both eyes at bedtime.    . canagliflozin (INVOKANA) 100 MG TABS tablet Take 1 tablet (100 mg total) by mouth daily. 90 tablet 1  . glipiZIDE (GLUCOTROL) 5 MG tablet Take 2 tablets in the AM before breakfast, 1 tablet before larger lunch and 2 tablets before dinner. 450 tablet 1  . Insulin Detemir (LEVEMIR FLEXTOUCH) 100 UNIT/ML Pen Inject 20 units in the  morning and 30 units in the evening 45 mL 1  . lisinopril (PRINIVIL,ZESTRIL) 20 MG tablet Take 1 tablet (20 mg total) by mouth daily. 90 tablet 3  . metFORMIN (GLUCOPHAGE-XR) 500 MG 24 hr tablet Take 2 tablets by mouth  twice a day 360 tablet 5  . naproxen (NAPROSYN) 500 MG tablet Take 1 tablet by mouth  twice a day with a meal 180 tablet 4  . NON FORMULARY Take 2 tablets by mouth daily. Diabetic vitamin packet 1 daily    . NON FORMULARY Alpha-lopic acid 200 mg-Take one daily    . Nutritional Supplements (PROSTATE ENZYME FORMULA PO) Take by mouth. Take 2 in pm    . ONE TOUCH ULTRA TEST test strip Use to test blood sugar 4  times daily 400 each 1  . Pitavastatin Calcium 4 MG TABS Take 1 tablet (4 mg total) by mouth daily. 90 tablet 3  . tadalafil (CIALIS) 20 MG tablet Take 1  tablet by mouth  daily as needed for   erectile dysfunction. 6 tablet 5  . tamsulosin (FLOMAX) 0.4 MG CAPS capsule Take 0.4 mg by mouth daily after supper.     [name] HYDROmorphone (DILAUDID) 4 MG tablet Take 4 mg by mouth every 4 (four) hours as needed. Reported on 01/22/2016    . insulin aspart (NOVOLOG FLEXPEN) 100 UNIT/ML FlexPen Inject 5-8 Units into the skin 3 (three) times daily before meals. (  Patient not taking: Reported on 01/22/2016) 15 mL 2  . Insulin Pen Needle (CAREFINE PEN NEEDLES) 31G X 6 MM MISC Use to inject insulin 2 times daily as instructed. (Patient not taking: Reported on 01/22/2016) 200 each 3   No current facility-administered medications on file prior to visit.   Allergies  Allergen Reactions  . Demerol Other (See Comments)    Blood pressure dropped completely out  . Meperidine Hcl Other (See Comments)    Drops blood pressure out  . Nitrospan [Nitroglycerin] Other (See Comments)    Blood pressure dropped out  . Sulfa Antibiotics Other (See Comments)    Urinary burning; blisters    Family History  Problem Relation Age of Onset  . Heart disease Brother     Atrial fibrillation  . Heart disease Father     CHF  . Heart disease Mother    PE: BP 150/78 mmHg  Pulse 77  Ht 6\' 4"  (1.93 m)  Wt 199 lb 3.2 oz (90.357 kg)  BMI 24.26 kg/m2  SpO2 91% Body mass index is 24.26 kg/(m^2). Wt Readings from Last 3 Encounters:  01/22/16 199 lb 3.2 oz (90.357 kg)  10/11/15 201 lb (91.173 kg)  08/27/15 208 lb (94.348 kg)   Constitutional: normal weight, in NAD Eyes: PERRLA, EOMI, no exophthalmos ENT: moist mucous membranes, no thyromegaly, no cervical lymphadenopathy Cardiovascular: RRR, No MRG Respiratory: CTA B Gastrointestinal: abdomen soft, NT, ND, BS+ Musculoskeletal: no deformities, strength intact in all 4 Skin: moist, warm, no rashes Neurological: no tremor with outstretched hands, DTR normal (except L patellar - TKR)  ASSESSMENT: 1. DM2, insulin-dependent,  uncontrolled, without complications  PLAN:  1. Patient with long-standing, fairly well controlled diabetes, on metformin, Invokana, Glipizide, and basal insulin. Sugars improved after switching to Livalo. He had to stop Invokana yesterday as this is too expensive for him (400$) >> will stay off and try to adjust his diet. If sugars still drop, will reduce insulin doses. We may need mealtime insulin in the future, but this will need to be in the form of 70/30 insulin as all insulins are Tier 3 for him. - I advised him to:  Patient Instructions  Please continue: - Metformin XR 1000 mg 2x a day - Glipizide 10 mg in am before b'fast, 5 mg before lunch (with a larger lunch), and 10 mg before dinner.   If you still have lower blood sugars after stopping Invokana, please decrease insulin: - Levemir 20 >> 15 units in am and 25-30 >> 20-25 units at bedtime  Please return in 3 months with your sugar log.   - Continue checking sugars at different times of the day - 3-4 times a day, rotating checks - advised for yearly eye exams >> he is up-to-date  - given flu vaccine this season - check HbA1c today >> 7.2% (stable) - Return to clinic in 3 mo with sugar log

## 2016-01-22 NOTE — Patient Instructions (Signed)
Please continue: - Metformin XR 1000 mg 2x a day - Glipizide 10 mg in am before b'fast, 5 mg before lunch (with a larger lunch), and 10 mg before dinner.   If you still have lower blood sugars after stopping Invokana, please decrease insulin: - Levemir 20 >> 15 units in am and 25-30 >> 20-25 units at bedtime  Please return in 3 months with your sugar log.

## 2016-02-25 ENCOUNTER — Ambulatory Visit: Payer: PPO | Admitting: Family Medicine

## 2016-02-29 ENCOUNTER — Telehealth: Payer: Self-pay | Admitting: Internal Medicine

## 2016-02-29 ENCOUNTER — Other Ambulatory Visit: Payer: Self-pay

## 2016-02-29 DIAGNOSIS — H2513 Age-related nuclear cataract, bilateral: Secondary | ICD-10-CM | POA: Diagnosis not present

## 2016-02-29 DIAGNOSIS — H401122 Primary open-angle glaucoma, left eye, moderate stage: Secondary | ICD-10-CM | POA: Diagnosis not present

## 2016-02-29 DIAGNOSIS — H401111 Primary open-angle glaucoma, right eye, mild stage: Secondary | ICD-10-CM | POA: Diagnosis not present

## 2016-02-29 MED ORDER — GLIPIZIDE 5 MG PO TABS: ORAL_TABLET | ORAL | 1 refills | Status: DC

## 2016-02-29 NOTE — Telephone Encounter (Signed)
PT needs Glipizide refill sent to [facility].

## 2016-03-03 ENCOUNTER — Ambulatory Visit (INDEPENDENT_AMBULATORY_CARE_PROVIDER_SITE_OTHER): Payer: PPO | Admitting: Family Medicine

## 2016-03-03 ENCOUNTER — Encounter: Payer: Self-pay | Admitting: Family Medicine

## 2016-03-03 VITALS — BP 132/88 | HR 64 | Temp 97.2°F | Ht 76.0 in | Wt 203.8 lb

## 2016-03-03 DIAGNOSIS — E786 Lipoprotein deficiency: Secondary | ICD-10-CM | POA: Diagnosis not present

## 2016-03-03 DIAGNOSIS — I10 Essential (primary) hypertension: Secondary | ICD-10-CM

## 2016-03-03 DIAGNOSIS — E785 Hyperlipidemia, unspecified: Secondary | ICD-10-CM

## 2016-03-03 NOTE — Progress Notes (Signed)
BP 132/88 (BP Location: Left Arm, Patient Position: Sitting, Cuff Size: Large)   Pulse 64   Temp 97.2 F (36.2 C) (Oral)   Ht 6\' 4"  (1.93 m)   Wt 203 lb 12.8 oz (92.4 kg)   BMI 24.81 kg/m    Subjective:    Patient ID: Nathaniel Thomas, male    DOB: [birth year], 65 y.o.   MRN: [id number]  HPI: Nathaniel Thomas is a 65 y.o. male presenting on 03/03/2016 for Establish Care (patient is not fasting, sees an endocrinologist for diabetic care)   HPI Hypertension Patient is coming in to establish care with our office here today. He has a history of hypertension which is currently being managed by his cardiologist and coronary artery disease which is managed by his cardiologist as well. His cardiologist is also managing his cholesterol as well. His blood pressure today currently is 132/88. He just recently had labs done through both his cardiologist and his endocrinologist for diabetes and kidneys and blood pressure. He is doing well on his current medications and denies any major issues. They are working to get his diabetic medications down so its under control. He denies any major issues today but needs a PCP and that is why he is coming to establish today  Relevant past medical, surgical, family and social history reviewed and updated as indicated. Interim medical history since our last visit reviewed. Allergies and medications reviewed and updated.  Review of Systems  Constitutional: Negative for fever.  HENT: Negative for ear discharge and ear pain.   Eyes: Negative for discharge and visual disturbance.  Respiratory: Negative for shortness of breath and wheezing.   Cardiovascular: Negative for chest pain and leg swelling.  Gastrointestinal: Negative for abdominal pain, constipation and diarrhea.  Genitourinary: Negative for difficulty urinating.  Musculoskeletal: Negative for back pain and gait problem.  Skin: Negative for rash.  Neurological: Negative for syncope, light-headedness and  headaches.  All other systems reviewed and are negative.   Per HPI unless specifically indicated above  Social History   Social History  . Marital status: Married    Spouse name: N/A  . Number of children: 2  . Years of education: N/A   Occupational History  .      Retired   Social History Main Topics  . Smoking status: Never Smoker  . Smokeless tobacco: Former Systems developer    Quit date: 02/02/2004  . Alcohol use No  . Drug use: No  . Sexual activity: Yes     Comment: 33 year of marriage   Other Topics Concern  . Not on file   Social History Narrative  . No narrative on file    Past Surgical History:  Procedure Laterality Date  . ANKLE SURGERY     2013/ wears boot cast  . BASAL CELL CARCINOMA EXCISION     hands  . LAMINECTOMY  1983/1988  . Left knee replacement  2002/ 2004  . LITHOTRIPSY     was cancelled/pt passed stone  . MOHS SURGERY     nose  . TONSILLECTOMY      Family History  Problem Relation Age of Onset  . Heart disease Brother     Atrial fibrillation  . Heart disease Father     CHF  . Heart disease Mother   . Heart disease Brother     atrial fib  . Cancer Sister     bone      Medication List  Accurate as of 03/03/16 10:17 AM. Always use your most recent med list.          aspirin EC 325 MG tablet Take 1 tablet (325 mg total) by mouth daily.   LUMIGAN 0.01 % Soln Generic drug:  bimatoprost   bimatoprost 0.03 % ophthalmic solution Commonly known as:  LUMIGAN Place 1 drop into both eyes at bedtime.   glipiZIDE 5 MG tablet Commonly known as:  GLUCOTROL Take 2 tablets in the AM before breakfast, 1 tablet before larger lunch and 2 tablets before dinner.   HYDROmorphone 4 MG tablet Commonly known as:  DILAUDID Take 4 mg by mouth every 4 (four) hours as needed. Reported on 01/22/2016   Insulin Detemir 100 UNIT/ML Pen Commonly known as:  LEVEMIR FLEXTOUCH Inject 20 units in the  morning and 30 units in the evening   Insulin Pen  Needle 31G X 6 MM Misc Commonly known as:  CAREFINE PEN NEEDLES Use to inject insulin 2 times daily as instructed.   lisinopril 20 MG tablet Commonly known as:  PRINIVIL,ZESTRIL Take 1 tablet (20 mg total) by mouth daily.   metFORMIN 500 MG 24 hr tablet Commonly known as:  GLUCOPHAGE-XR Take 2 tablets by mouth  twice a day   naproxen 500 MG tablet Commonly known as:  NAPROSYN Take 1 tablet by mouth  twice a day with a meal   NON FORMULARY Take 2 tablets by mouth daily. Diabetic vitamin packet 1 daily   NON FORMULARY Alpha-lopic acid 200 mg-Take one daily   ONE TOUCH ULTRA TEST test strip Generic drug:  glucose blood Use to test blood sugar 4  times daily   Pitavastatin Calcium 4 MG Tabs Take 1 tablet (4 mg total) by mouth daily.   PROSTATE ENZYME FORMULA PO Take by mouth. Take 2 in pm   tadalafil 20 MG tablet Commonly known as:  CIALIS Take 1 tablet by mouth  daily as needed for   erectile dysfunction.   tamsulosin 0.4 MG Caps capsule Commonly known as:  FLOMAX Take 0.4 mg by mouth daily after supper.          Objective:    BP 132/88 (BP Location: Left Arm, Patient Position: Sitting, Cuff Size: Large)   Pulse 64   Temp 97.2 F (36.2 C) (Oral)   Ht 6\' 4"  (1.93 m)   Wt 203 lb 12.8 oz (92.4 kg)   BMI 24.81 kg/m   Wt Readings from Last 3 Encounters:  03/03/16 203 lb 12.8 oz (92.4 kg)  01/22/16 199 lb 3.2 oz (90.4 kg)  10/11/15 201 lb (91.2 kg)    Physical Exam  Constitutional: He is oriented to person, place, and time. He appears well-developed and well-nourished. No distress.  Eyes: Conjunctivae and EOM are normal. Pupils are equal, round, and reactive to light. Right eye exhibits no discharge. No scleral icterus.  Neck: Neck supple. No thyromegaly present.  Cardiovascular: Normal rate, regular rhythm, normal heart sounds and intact distal pulses.   No murmur heard. Pulmonary/Chest: Effort normal and breath sounds normal. No respiratory distress. He has  no wheezes.  Musculoskeletal: Normal range of motion. He exhibits no edema.  Lymphadenopathy:    He has no cervical adenopathy.  Neurological: He is alert and oriented to person, place, and time. Coordination normal.  Skin: Skin is warm and dry. No rash noted. He is not diaphoretic.  Psychiatric: He has a normal mood and affect. His behavior is normal.  Nursing note and vitals reviewed.  Results for orders placed or performed in visit on 01/22/16  POCT A1C  Result Value Ref Range   Hemoglobin A1C 7.2       Assessment & Plan:   Problem List Items Addressed This Visit      Cardiovascular and Mediastinum   Essential hypertension - Primary     Other   Hyperlipidemia with low HDL    Other Visit Diagnoses   None.      Follow up plan: Return in about 1 year (around 03/03/2017), or if symptoms worsen or fail to improve.  Caryl Pina, MD [facility] 03/03/2016, 10:18 AM

## 2016-04-16 DIAGNOSIS — R972 Elevated prostate specific antigen [PSA]: Secondary | ICD-10-CM | POA: Diagnosis not present

## 2016-04-16 DIAGNOSIS — N200 Calculus of kidney: Secondary | ICD-10-CM | POA: Diagnosis not present

## 2016-04-23 ENCOUNTER — Encounter: Payer: Self-pay | Admitting: Internal Medicine

## 2016-04-23 ENCOUNTER — Ambulatory Visit (INDEPENDENT_AMBULATORY_CARE_PROVIDER_SITE_OTHER): Payer: PPO | Admitting: Internal Medicine

## 2016-04-23 VITALS — BP 120/82 | HR 76 | Ht 76.0 in | Wt 202.0 lb

## 2016-04-23 DIAGNOSIS — IMO0001 Reserved for inherently not codable concepts without codable children: Secondary | ICD-10-CM

## 2016-04-23 DIAGNOSIS — Z794 Long term (current) use of insulin: Secondary | ICD-10-CM

## 2016-04-23 DIAGNOSIS — E1165 Type 2 diabetes mellitus with hyperglycemia: Secondary | ICD-10-CM

## 2016-04-23 DIAGNOSIS — Z23 Encounter for immunization: Secondary | ICD-10-CM | POA: Diagnosis not present

## 2016-04-23 LAB — POCT GLYCOSYLATED HEMOGLOBIN (HGB A1C): Hemoglobin A1C: 7.5

## 2016-04-23 MED ORDER — INSULIN ASPART PROT & ASPART (70-30 MIX) 100 UNIT/ML ~~LOC~~ SUSP: 30.0000 [IU] | Freq: Two times a day (BID) | SUBCUTANEOUS | 11 refills | Status: DC

## 2016-04-23 MED ORDER — INSULIN PEN NEEDLE 32G X 4 MM MISC: 5 refills | Status: DC

## 2016-04-23 NOTE — Patient Instructions (Signed)
Please continue: - Metformin XR 1000 mg 2x a day - Glipizide 10 mg in am before b'fast, 5 mg before lunch (with a larger lunch), and 10 mg before dinner.  - Invokana 100 mg daily in am before b'fast   Stop Levemir and start NovoLog 70/30 30 units 2x a day.  If you plan to be more active after a meal >> decrease the dose to 25 units.  Please return in 1.5 months with your sugar log.

## 2016-04-23 NOTE — Progress Notes (Signed)
Patient ID: Nathaniel Thomas, male   DOB: 28-Apr-1951, 65 y.o.   MRN: [id number]  HPI: Nathaniel Thomas is a 66 y.o.-year-old male, returning for f/u for DM2, dx 1999, insulin-dependent since 2008, uncontrolled, without complications. Last visit 3 mo ago.  He is in the catastrophic phase of M'care until January.   Last hemoglobin A1c was: Lab Results  Component Value Date   HGBA1C 7.2 01/22/2016   HGBA1C 7.3 10/11/2015   HGBA1C 7.3 07/13/2015  He had a steroid inj and Prednisone taper in 07/2014.  Pt is on a regimen of: - Metformin XR 1000 mg 2x a day - Glipizide 10 mg in am before b'fast, 5 mg before lunch (with a larger lunch), and 10 mg before dinner.  - Levemir 20 units in am and 35-37 units at bedtime - Invokana 100 mg daily in am before b'fast - started 03/2016 (was off 1-2 weeks) We stopped Humalog 75/25 15 in am, 10-20 at lunch, 8-20 at dinner  Pt checks his sugars 3-4 x a day: - am: 76, 91-140 >> 71, 91-150,170 >> 75-153, 183 >> 75-143, 161, 174 >> 64, 71-138, 181 >> 60, 82-152, 180, 227 - 2h after b'fast: 152-211, 235x1(cereals) >> 127-185, 236, 280 >> 115-194, 235 >> 125-187 >> 133-215 - before lunch: 72-154 >> 71, 82-135 >> 86-135, 250x1 >> 80-133, 166 >> 69, 79-163 >> 69-130 >> 67, 96-138, 202 - 2h after lunch:100, 150 >> 160-175 >> 162-206, 220 >> 149-244, 288 >> 108-190 >> 191-227 - before dinner: 143- 205, 254 (higher 2/2 prostatitis) >> 98-144 >> 78-183, 228 >> 97-142, 180 >> 90-152, 214 - 2h after dinner: 94-193 >> 56, 212-334 >> n/c >> 180-205 >> 156, 176, 178 >> 191-335 >> 173-349 >> 181-236 - nighttime: 49-59 >> 101-167, 195 >> 120-153, 185 >> 107-207, higher with prostatitis  >> n/c >> 146-216 No lows. Lowest sugar was 64 >> 60;  he has hypoglycemia awareness at 60.  Highest sugar was 349 >> 200s.  Pt's meals are: - Breakfast: [place] bacon, 1-2 eggs, butter; protein shake 3x a week; toast - Lunch: salad - large; rotisserie chicken or cheese - Dinner: chicken chilli,  brown rice  - Snacks: fruits, fruit cake; nuts; black bean chips  He exercises 2x times a week by doing cardio and lifting weights.  - no CKD, last BUN/creatinine:  Lab Results  Component Value Date   BUN 22 12/19/2015   CREATININE 1.06 12/19/2015  On Lisinopril - last set of lipids: Lab Results  Component Value Date   CHOL 128 11/09/2015   HDL 36 (L) 11/09/2015   LDLCALC 69 11/09/2015   LDLDIRECT 113.0 02/23/2015   TRIG 117 11/09/2015   CHOLHDL 3.6 11/09/2015  On Pitavastatin (Livalo) - started 08/27/2015, switching from Lipitor, then Pravastatin. - last eye exam was in 12/13/2015. No DR. + glaucoma. He will have cataract sx. Dr. Prudencio Burly. - no numbness and tingling in his feet.  ROS: Constitutional: no weight gain/loss, no fatigue, no subjective hyperthermia/hypothermia; + nocturia and burning with urination (kidney stones) Eyes: no blurry vision, no xerophthalmia ENT: no sore throat, no nodules palpated in throat, no dysphagia/odynophagia, no hoarseness Cardiovascular: no CP/SOB/palpitations/leg swelling Respiratory:no cough/no SOB Gastrointestinal: no N/V/D/C Musculoskeletal: no muscle/joint aches Skin: no rash Neurological: no tremors/numbness/tingling/dizziness  I reviewed pt's medications, allergies, PMH, social hx, family hx, and changes were documented in the history of present illness. Otherwise, unchanged from my initial visit note:  Past Medical History:  Diagnosis Date  . CAD (coronary  artery disease)   . Cataract   . CHICKENPOX, HX OF 08/30/2008   Qualifier: Diagnosis of  By: [name], [name]    . Chronic kidney disease   . Diabetes mellitus   . Glaucoma   . History of urinary calculi 08/30/2008   Qualifier: Diagnosis of  By: [name], Jacelyn Pi   . Hyperlipidemia   . Hypertension   . Renal calculus or stone   . Skin cancer of nose    ears and hands   Past Surgical History:  Procedure Laterality Date  . ANKLE SURGERY     2013/ wears boot cast  . BASAL  CELL CARCINOMA EXCISION     hands  . LAMINECTOMY  1983/1988  . Left knee replacement  2002/ 2004  . LITHOTRIPSY     was cancelled/pt passed stone  . MOHS SURGERY     nose  . TONSILLECTOMY     History   Social History  . Marital Status: Married    Spouse Name: N/A    Number of Children: 2   Occupational History  .      Retired   Social History Main Topics  . Smoking status: Never Smoker   . Smokeless tobacco: Never Used  . Alcohol Use: No  . Drug Use: No   Current Outpatient Prescriptions on File Prior to Visit  Medication Sig Dispense Refill  . aspirin EC 325 MG tablet Take 1 tablet (325 mg total) by mouth daily. 30 tablet 0  . glipiZIDE (GLUCOTROL) 5 MG tablet Take 2 tablets in the AM before breakfast, 1 tablet before larger lunch and 2 tablets before dinner. 450 tablet 1  . HYDROmorphone (DILAUDID) 4 MG tablet Take 4 mg by mouth every 4 (four) hours as needed. Reported on 01/22/2016    . Insulin Detemir (LEVEMIR FLEXTOUCH) 100 UNIT/ML Pen Inject 20 units in the  morning and 30 units in the evening 45 mL 1  . Insulin Pen Needle (CAREFINE PEN NEEDLES) 31G X 6 MM MISC Use to inject insulin 2 times daily as instructed. 200 each 3  . lisinopril (PRINIVIL,ZESTRIL) 20 MG tablet Take 1 tablet (20 mg total) by mouth daily. (Patient taking differently: Take 20 mg by mouth daily. ) 90 tablet 3  . LUMIGAN 0.01 % SOLN     . metFORMIN (GLUCOPHAGE-XR) 500 MG 24 hr tablet Take 2 tablets by mouth  twice a day 360 tablet 5  . naproxen (NAPROSYN) 500 MG tablet Take 1 tablet by mouth  twice a day with a meal 180 tablet 4  . NON FORMULARY Take 2 tablets by mouth daily. Diabetic vitamin packet 1 daily    . NON FORMULARY Alpha-lopic acid 200 mg-Take one daily    . Nutritional Supplements (PROSTATE ENZYME FORMULA PO) Take by mouth. Take 2 in pm    . ONE TOUCH ULTRA TEST test strip Use to test blood sugar 4  times daily 400 each 1  . Pitavastatin Calcium 4 MG TABS Take 1 tablet (4 mg total) by  mouth daily. 90 tablet 3  . tamsulosin (FLOMAX) 0.4 MG CAPS capsule Take 0.4 mg by mouth daily after supper.     . bimatoprost (LUMIGAN) 0.03 % ophthalmic solution Place 1 drop into both eyes at bedtime.    . tadalafil (CIALIS) 20 MG tablet Take 1 tablet by mouth  daily as needed for   erectile dysfunction. (Patient not taking: Reported on 04/23/2016) 6 tablet 5   No current facility-administered medications on file  prior to visit.    Allergies  Allergen Reactions  . Demerol Other (See Comments)    Blood pressure dropped completely out  . Meperidine Hcl Other (See Comments)    Drops blood pressure out  . Nitrospan [Nitroglycerin] Other (See Comments)    Blood pressure dropped out  . Sulfa Antibiotics Other (See Comments)    Urinary burning; blisters    Family History  Problem Relation Age of Onset  . Heart disease Brother     Atrial fibrillation  . Heart disease Father     CHF  . Heart disease Mother   . Heart disease Brother     atrial fib  . Cancer Sister     bone   PE: BP 120/82 (BP Location: Left Arm, Patient Position: Sitting)   Pulse 76   Ht 6\' 4"  (1.93 m)   Wt 202 lb (91.6 kg)   SpO2 96%   BMI 24.59 kg/m  Body mass index is 24.59 kg/m. Wt Readings from Last 3 Encounters:  04/23/16 202 lb (91.6 kg)  03/03/16 203 lb 12.8 oz (92.4 kg)  01/22/16 199 lb 3.2 oz (90.4 kg)   Constitutional: normal weight, in NAD Eyes: PERRLA, EOMI, no exophthalmos ENT: moist mucous membranes, no thyromegaly, no cervical lymphadenopathy Cardiovascular: RRR, No MRG Respiratory: CTA B Gastrointestinal: abdomen soft, NT, ND, BS+ Musculoskeletal: no deformities, strength intact in all 4 Skin: moist, warm, no rashes Neurological: no tremor with outstretched hands, DTR normal (except L patellar - TKR)  ASSESSMENT: 1. DM2, insulin-dependent, uncontrolled, without complications  PLAN:  1. Patient with long-standing, fairly well controlled diabetes, on metformin, Glipizide, Invokana, and  basal insulin. Sugars improved after switching to Livalo, but sugars higher now >> will need 70/30 insulin as all insulins are Tier 3 for him. - I advised him to:  Patient Instructions  Please continue: - Metformin XR 1000 mg 2x a day - Glipizide 10 mg in am before b'fast, 5 mg before lunch (with a larger lunch), and 10 mg before dinner.  - Invokana 100 mg daily in am before b'fast   Stop Levemir and start NovoLog 70/30 30 units 2x a day.  If you plan to be more active after a meal >> decrease the dose to 25 units.  Please return in 1.5 months with your sugar log.   - Continue checking sugars at different times of the day - 3-4 times a day, rotating checks - advised for yearly eye exams >> he is up-to-date  (Dr Prudencio Burly) - will give him the flu shot today - check HbA1c today >> 7.5% (higher) - Return to clinic in 3 mo with sugar log   Philemon Kingdom, MD PhD [facility] Endocrinology

## 2016-04-23 NOTE — Addendum Note (Signed)
Addended by: Caprice Beaver T on: 04/23/2016 10:13 AM   Modules accepted: Orders

## 2016-05-21 ENCOUNTER — Encounter: Payer: Self-pay | Admitting: Family Medicine

## 2016-05-21 ENCOUNTER — Ambulatory Visit (INDEPENDENT_AMBULATORY_CARE_PROVIDER_SITE_OTHER): Payer: PPO | Admitting: Family Medicine

## 2016-05-21 ENCOUNTER — Ambulatory Visit (INDEPENDENT_AMBULATORY_CARE_PROVIDER_SITE_OTHER): Payer: PPO

## 2016-05-21 VITALS — BP 131/71 | HR 87 | Temp 97.0°F | Ht 76.0 in | Wt 212.2 lb

## 2016-05-21 DIAGNOSIS — M25572 Pain in left ankle and joints of left foot: Secondary | ICD-10-CM | POA: Diagnosis not present

## 2016-05-21 IMAGING — DX DG ANKLE COMPLETE 3+V*L*
3 series · 3 of 3 positions shown · non-contrast
Comparison: No recent.

CLINICAL DATA: Twisted ankle.

EXAM:
LEFT ANKLE COMPLETE - 3+ VIEW

[ankle ap]
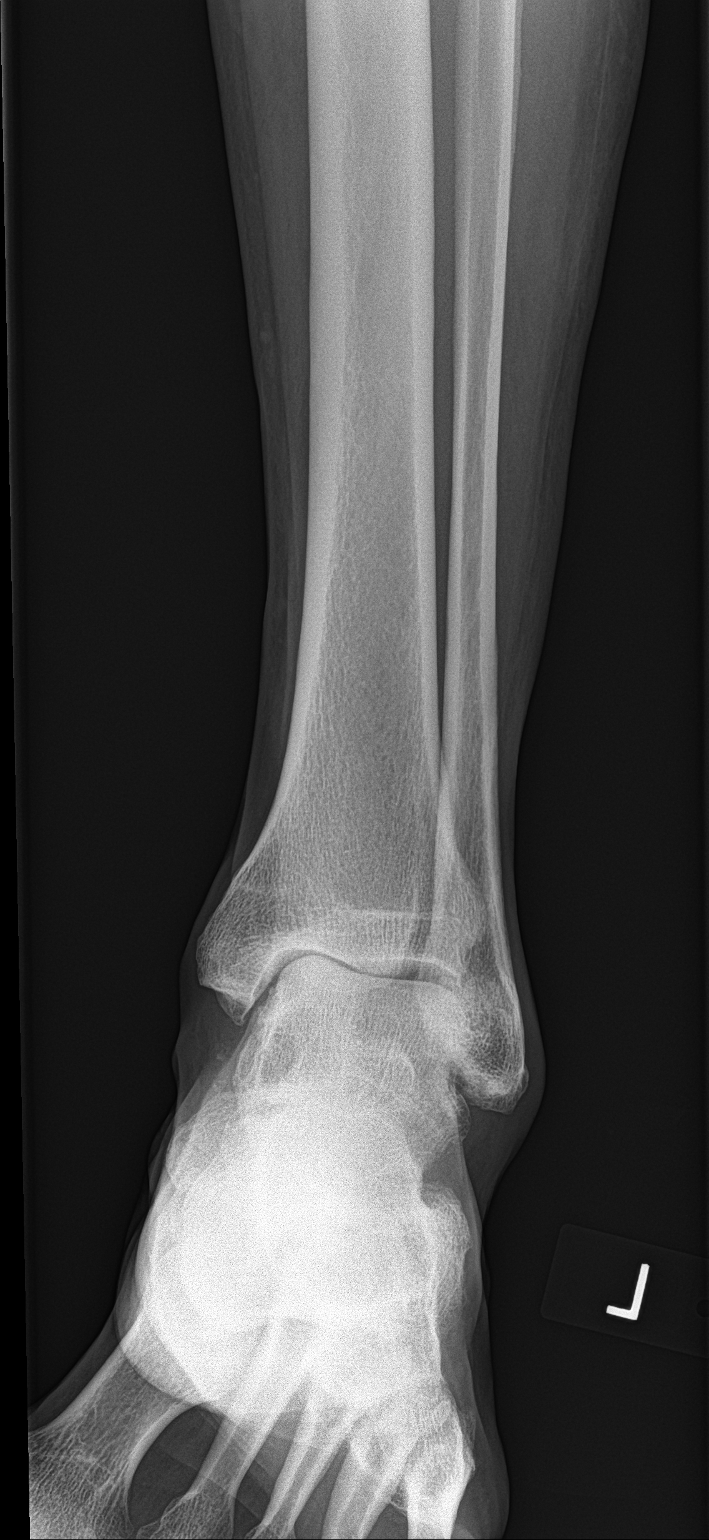

[ankle obl]
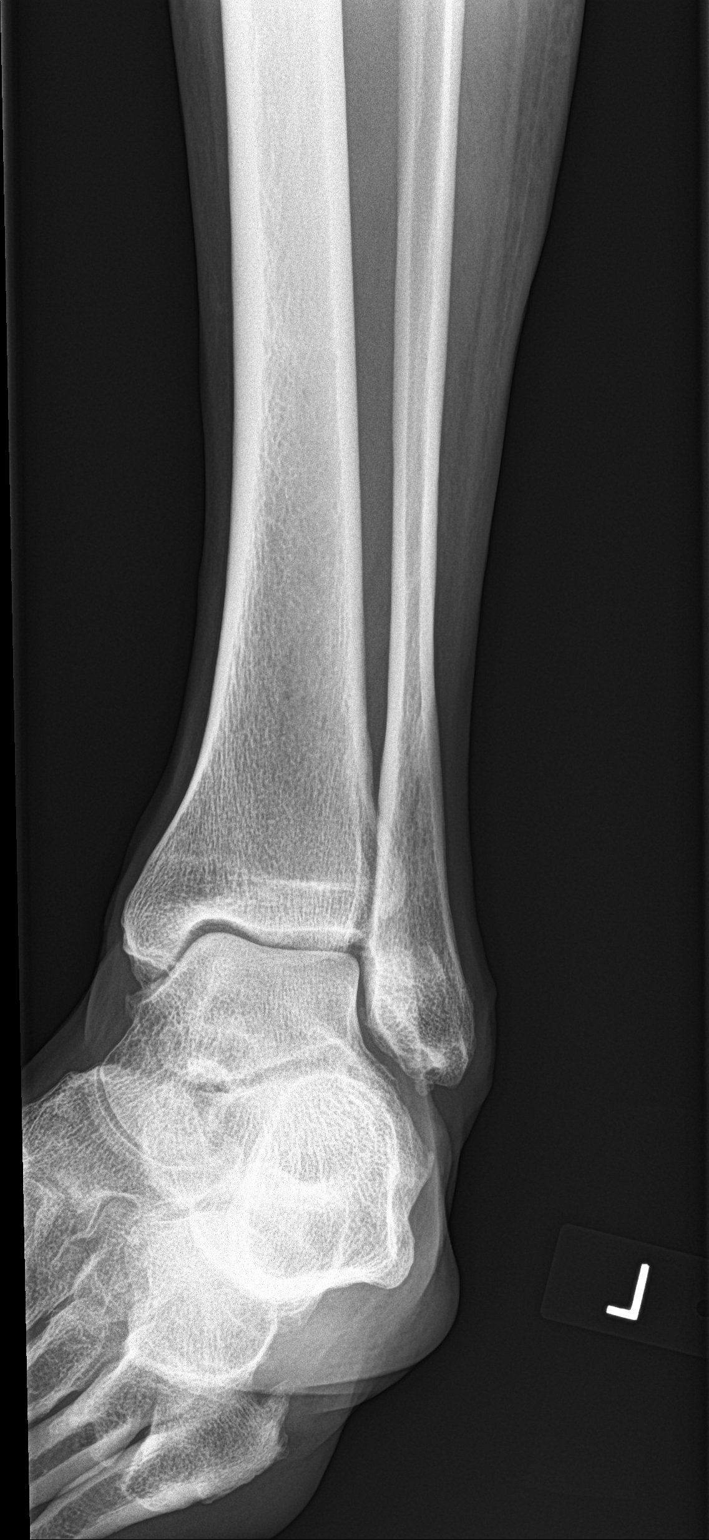

[ankle lat]
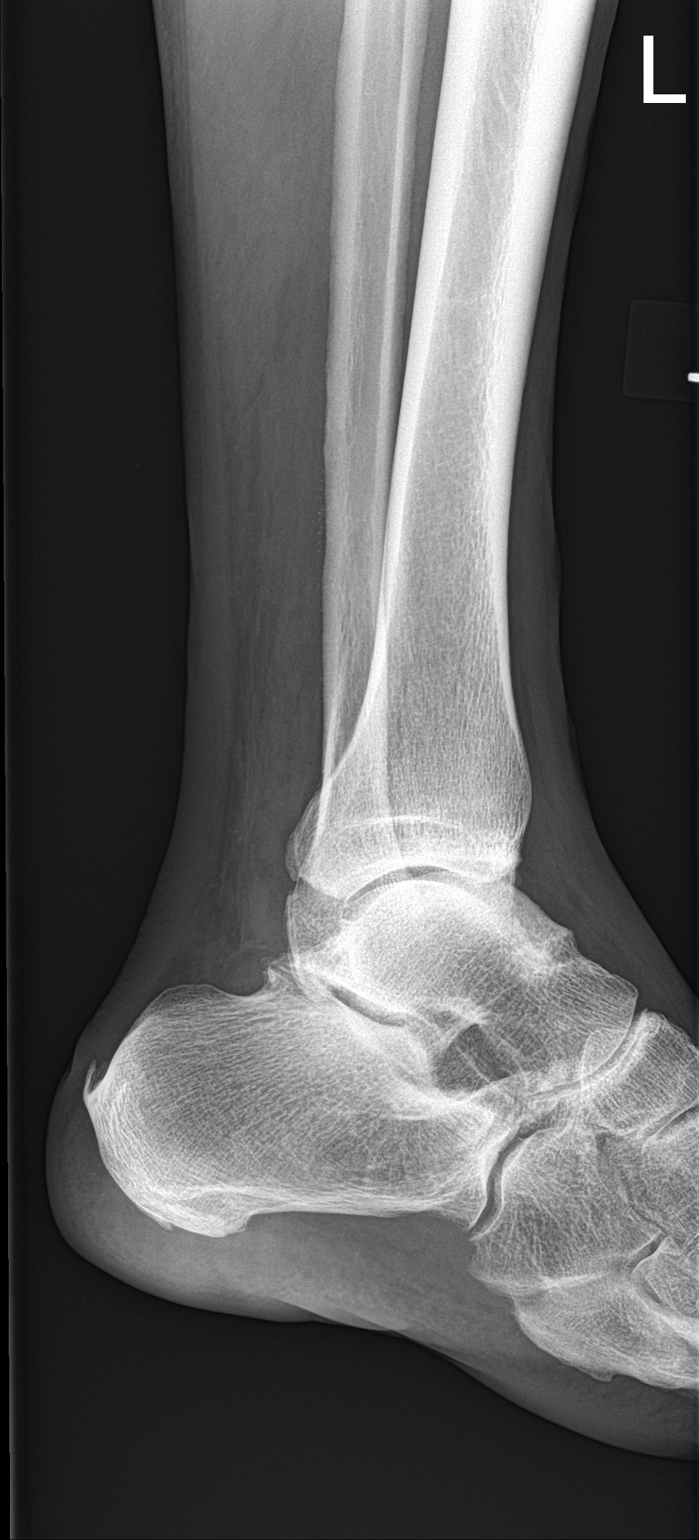

[3 of 3 positions shown; findings below may reference images not displayed]

FINDINGS: Diffuse degenerative change. Tiny bony density noted adjacent to the
medial malleolus and talus could represent a tiny fracture fragment.
This may be old.
IMPRESSION: Diffuse degenerative change. Tiny bony density noted adjacent to the
medial malleolus and talus may represent tiny fracture fragment.
This may be old.

## 2016-05-21 NOTE — Progress Notes (Signed)
BP 131/71   Pulse 87   Temp 97 F (36.1 C) (Oral)   Ht 6\' 4"  (1.93 m)   Wt 212 lb 3.2 oz (96.3 kg)   BMI 25.83 kg/m    Subjective:    Patient ID: Nathaniel Thomas, male    DOB: 1950/08/29, 65 y.o.   MRN: [id number]  HPI: Nathaniel Thomas is a 65 y.o. male presenting on 05/21/2016 for Left foot/ankle pain (began 2 weeks ago after swinging at a golf ball)   HPI  Left ankle pain About a week and a half ago patient was playing golf and felt a sharp twinge in that left ankle. He did not note that he rolled it or did anything special with that at the time but he has had persisting pain over the past week and a half. He denies any fevers or chills or redness or warmth. He is able to bear weight on it without any issues. He mainly has trouble when turning or twisting with that foot and sometimes with ambulation. The pain is mostly located on the top of his foot right near the ankle. It does not radiate anywhere else.  Relevant past medical, surgical, family and social history reviewed and updated as indicated. Interim medical history since our last visit reviewed. Allergies and medications reviewed and updated.  Review of Systems  Constitutional: Negative for chills and fever.  Respiratory: Negative for shortness of breath and wheezing.   Cardiovascular: Negative for chest pain and leg swelling.  Gastrointestinal: Negative for abdominal pain.  Musculoskeletal: Positive for arthralgias. Negative for back pain, gait problem and joint swelling.  Skin: Negative for color change and rash.  All other systems reviewed and are negative.  Per HPI unless specifically indicated above     Objective:    BP 131/71   Pulse 87   Temp 97 F (36.1 C) (Oral)   Ht 6\' 4"  (1.93 m)   Wt 212 lb 3.2 oz (96.3 kg)   BMI 25.83 kg/m   Wt Readings from Last 3 Encounters:  05/21/16 212 lb 3.2 oz (96.3 kg)  04/23/16 202 lb (91.6 kg)  03/03/16 203 lb 12.8 oz (92.4 kg)    Physical Exam  Constitutional: He is  oriented to person, place, and time. He appears well-developed and well-nourished. No distress.  Eyes: Conjunctivae are normal. Right eye exhibits no discharge. No scleral icterus.  Musculoskeletal: Normal range of motion. He exhibits no edema.       Left ankle: He exhibits normal range of motion, no swelling, no deformity and normal pulse. Tenderness. AITFL tenderness found.  Neurological: He is alert and oriented to person, place, and time. Coordination normal.  Skin: Skin is warm and dry. No rash noted. He is not diaphoretic.  Psychiatric: He has a normal mood and affect. His behavior is normal.  Nursing note and vitals reviewed.  Ankle x-ray: No signs of acute bony abnormalities. Await final read by radiology    Assessment & Plan:   Problem List Items Addressed This Visit    None    Visit Diagnoses    Acute left ankle pain    -  Primary   1.5 weeks ago, persistent, will do x-ray, x-ray was normal, likely a sprain, ice heat stretching and exercises   Relevant Orders   DG Ankle Complete Left       Follow up plan: Return if symptoms worsen or fail to improve.  Counseling provided for all of the vaccine components Orders  Placed This Encounter  Procedures  . DG Ankle Complete Left    Caryl Pina, MD [facility] 05/21/2016, 8:21 AM

## 2016-06-05 ENCOUNTER — Other Ambulatory Visit: Payer: Self-pay | Admitting: Cardiology

## 2016-06-05 DIAGNOSIS — I2510 Atherosclerotic heart disease of native coronary artery without angina pectoris: Secondary | ICD-10-CM

## 2016-06-10 ENCOUNTER — Ambulatory Visit: Payer: PPO | Admitting: Internal Medicine

## 2016-06-18 DIAGNOSIS — Z23 Encounter for immunization: Secondary | ICD-10-CM | POA: Diagnosis not present

## 2016-06-18 DIAGNOSIS — D0439 Carcinoma in situ of skin of other parts of face: Secondary | ICD-10-CM | POA: Diagnosis not present

## 2016-06-18 DIAGNOSIS — Z85828 Personal history of other malignant neoplasm of skin: Secondary | ICD-10-CM | POA: Diagnosis not present

## 2016-06-18 DIAGNOSIS — D485 Neoplasm of uncertain behavior of skin: Secondary | ICD-10-CM | POA: Diagnosis not present

## 2016-06-18 DIAGNOSIS — D2271 Melanocytic nevi of right lower limb, including hip: Secondary | ICD-10-CM | POA: Diagnosis not present

## 2016-06-18 DIAGNOSIS — L570 Actinic keratosis: Secondary | ICD-10-CM | POA: Diagnosis not present

## 2016-06-20 ENCOUNTER — Ambulatory Visit (INDEPENDENT_AMBULATORY_CARE_PROVIDER_SITE_OTHER): Payer: PPO | Admitting: Internal Medicine

## 2016-06-20 ENCOUNTER — Encounter: Payer: Self-pay | Admitting: Internal Medicine

## 2016-06-20 VITALS — BP 120/88 | HR 64 | Wt 212.0 lb

## 2016-06-20 DIAGNOSIS — Z794 Long term (current) use of insulin: Secondary | ICD-10-CM | POA: Diagnosis not present

## 2016-06-20 DIAGNOSIS — E1165 Type 2 diabetes mellitus with hyperglycemia: Secondary | ICD-10-CM

## 2016-06-20 DIAGNOSIS — IMO0001 Reserved for inherently not codable concepts without codable children: Secondary | ICD-10-CM

## 2016-06-20 MED ORDER — INSULIN DETEMIR 100 UNIT/ML FLEXPEN: 35.0000 [IU] | PEN_INJECTOR | Freq: Every day | SUBCUTANEOUS | 11 refills | Status: DC

## 2016-06-20 MED ORDER — INVOKANA 100 MG PO TABS: ORAL_TABLET | ORAL | 3 refills | Status: DC

## 2016-06-20 MED ORDER — INSULIN ASPART 100 UNIT/ML FLEXPEN: 7.0000 [IU] | PEN_INJECTOR | Freq: Three times a day (TID) | SUBCUTANEOUS | 11 refills | Status: DC

## 2016-06-20 MED ORDER — INSULIN PEN NEEDLE 32G X 4 MM MISC: 5 refills | Status: DC

## 2016-06-20 NOTE — Patient Instructions (Addendum)
Please continue: - Metformin XR 1000 mg 2x a day - Invokana 100 mg daily in am before b'fast   Please stop Glipizide.  Please continue Levemir, but decrease the dose to 35 units.  Start Novolog: - 7 units before a smaller meal - 9 units before a larger meal or if you have dessert  Try without NovoLog for lunch for now >> if sugars before dinner > 130 consistently, you may need to add 5-7 units before lunch.  Please return in 1.5 months with your sugar log.

## 2016-06-20 NOTE — Progress Notes (Signed)
Patient ID: Nathaniel Thomas, male   DOB: [birth year], 65 y.o.   MRN: [id number]  HPI: Nathaniel Thomas is a 65 y.o.-year-old male, returning for f/u for DM2, dx 1999, insulin-dependent since 2008, uncontrolled, without complications. Last visit 2 mo ago.  He is in the catastrophic phase of M'care until January.   Last hemoglobin A1c was: Lab Results  Component Value Date   HGBA1C 7.5 04/23/2016   HGBA1C 7.2 01/22/2016   HGBA1C 7.3 10/11/2015  He had a steroid inj and Prednisone taper in 07/2014.  Pt is on a regimen of: - Metformin XR 1000 mg 2x a day - Glipizide 10 mg in am before b'fast, 5 mg before lunch (with a larger lunch), and 10 mg before dinner.  - Invokana 100 mg daily in am before b'fast    - NovoLog 70/30 30 units 2x a day.   If you plan to be more active after a meal >> decrease the dose to 25 units. We stopped Humalog 75/25 15 in am, 10-20 at lunch, 8-20 at dinner  Pt checks his sugars 3-4 x a day: - am: 75-143, 161, 174 >> 64, 71-138, 181 >> 60, 82-152, 180, 227 >> 90-197 - 2h after b'fast: 127-185, 236, 280 >> 115-194, 235 >> 125-187 >> 133-215 >> 86-179, 191 - before lunch: 80-133, 166 >> 69, 79-163 >> 69-130 >> 67, 96-138, 202 >> 57-115 - 2h after lunch:160-175 >> 162-206, 220 >> 149-244, 288 >> 108-190 >> 191-227 >> 161, 172 - before dinner: 98-144 >> 78-183, 228 >> 97-142, 180 >> 90-152, 214 >> 79-138, 156 - 2h after dinner: 180-205 >> 156, 176, 178 >> 191-335 >> 173-349 >> 181-236 >> 59-152 - nighttime: 120-153, 185 >> 107-207, higher with prostatitis  >> n/c >> 146-216 > 44-155 No lows. Lowest sugar was 64 >> 60 >> 44;  he has hypoglycemia awareness at 60.  Highest sugar was 349 >> 200s >> 200s.  Pt's meals are: - Breakfast: [place] bacon, 1-2 eggs, butter; protein shake 3x a week; toast - Lunch: salad - large; rotisserie chicken or cheese - Dinner: chicken chilli, brown rice  - Snacks: fruits, fruit cake; nuts; black bean chips  He exercises 2x times a week by  doing cardio and lifting weights.  - no CKD, last BUN/creatinine:  Lab Results  Component Value Date   BUN 22 12/19/2015   CREATININE 1.06 12/19/2015  On Lisinopril - last set of lipids: Lab Results  Component Value Date   CHOL 128 11/09/2015   HDL 36 (L) 11/09/2015   LDLCALC 69 11/09/2015   LDLDIRECT 113.0 02/23/2015   TRIG 117 11/09/2015   CHOLHDL 3.6 11/09/2015  On Pitavastatin (Livalo) - started 08/27/2015, switching from Lipitor, then Pravastatin. - last eye exam was in 12/13/2015. No DR. + glaucoma. He will have cataract sx. Dr. Prudencio Burly. - no numbness and tingling in his feet.  ROS: Constitutional: no weight gain/loss, no fatigue, no subjective hyperthermia/hypothermia; + nocturia and burning with urination (kidney stones) Eyes: no blurry vision, no xerophthalmia ENT: no sore throat, no nodules palpated in throat, no dysphagia/odynophagia, no hoarseness Cardiovascular: no CP/SOB/palpitations/leg swelling Respiratory:no cough/no SOB Gastrointestinal: no N/V/D/C Musculoskeletal: no muscle/joint aches Skin: no rash Neurological: no tremors/numbness/tingling/dizziness  I reviewed pt's medications, allergies, PMH, social hx, family hx, and changes were documented in the history of present illness. Otherwise, unchanged from my initial visit note:  Past Medical History:  Diagnosis Date  . CAD (coronary artery disease)   . Cataract   .  CHICKENPOX, HX OF 08/30/2008   Qualifier: Diagnosis of  By: [name], [name]    . Chronic kidney disease   . Diabetes mellitus   . Glaucoma   . History of urinary calculi 08/30/2008   Qualifier: Diagnosis of  By: [name], Jacelyn Pi   . Hyperlipidemia   . Hypertension   . Renal calculus or stone   . Skin cancer of nose    ears and hands   Past Surgical History:  Procedure Laterality Date  . ANKLE SURGERY     2013/ wears boot cast  . BASAL CELL CARCINOMA EXCISION     hands  . LAMINECTOMY  1983/1988  . Left knee replacement  2002/ 2004   . LITHOTRIPSY     was cancelled/pt passed stone  . MOHS SURGERY     nose  . TONSILLECTOMY     History   Social History  . Marital Status: Married    Spouse Name: N/A    Number of Children: 2   Occupational History  .      Retired   Social History Main Topics  . Smoking status: Never Smoker   . Smokeless tobacco: Never Used  . Alcohol Use: No  . Drug Use: No   Current Outpatient Prescriptions on File Prior to Visit  Medication Sig Dispense Refill  . aspirin EC 325 MG tablet Take 1 tablet (325 mg total) by mouth daily. 30 tablet 0  . bimatoprost (LUMIGAN) 0.03 % ophthalmic solution Place 1 drop into both eyes at bedtime.    [name] glipiZIDE (GLUCOTROL) 5 MG tablet Take 2 tablets in the AM before breakfast, 1 tablet before larger lunch and 2 tablets before dinner. 450 tablet 1  . HYDROmorphone (DILAUDID) 4 MG tablet Take 4 mg by mouth every 4 (four) hours as needed. Reported on 01/22/2016    . insulin aspart protamine- aspart (NOVOLOG MIX 70/30) (70-30) 100 UNIT/ML injection Inject 0.3 mLs (30 Units total) into the skin 2 (two) times daily with a meal. Pens, please 15 mL 11  . Insulin Pen Needle (CAREFINE PEN NEEDLES) 32G X 4 MM MISC Use 2x a day 200 each 5  . INVOKANA 100 MG TABS tablet     . lisinopril (PRINIVIL,ZESTRIL) 20 MG tablet Take 1 tablet by mouth daily 90 tablet 2  . LUMIGAN 0.01 % SOLN     . metFORMIN (GLUCOPHAGE-XR) 500 MG 24 hr tablet Take 2 tablets by mouth  twice a day 360 tablet 5  . naproxen (NAPROSYN) 500 MG tablet Take 1 tablet by mouth  twice a day with a meal 180 tablet 4  . NON FORMULARY Take 2 tablets by mouth daily. Diabetic vitamin packet 1 daily    . NON FORMULARY Alpha-lopic acid 200 mg-Take one daily    . Nutritional Supplements (PROSTATE ENZYME FORMULA PO) Take by mouth. Take 2 in pm    . ONE TOUCH ULTRA TEST test strip Use to test blood sugar 4  times daily 400 each 1  . Pitavastatin Calcium 4 MG TABS Take 1 tablet (4 mg total) by mouth daily. 90  tablet 3  . tadalafil (CIALIS) 20 MG tablet Take 1 tablet by mouth  daily as needed for   erectile dysfunction. 6 tablet 5  . tamsulosin (FLOMAX) 0.4 MG CAPS capsule Take 0.4 mg by mouth daily after supper.      No current facility-administered medications on file prior to visit.    Allergies  Allergen Reactions  . Demerol Other (  See Comments)    Blood pressure dropped completely out  . Meperidine Hcl Other (See Comments)    Drops blood pressure out  . Nitrospan [Nitroglycerin] Other (See Comments)    Blood pressure dropped out  . Sulfa Antibiotics Other (See Comments)    Urinary burning; blisters    Family History  Problem Relation Age of Onset  . Heart disease Brother     Atrial fibrillation  . Heart disease Father     CHF  . Heart disease Mother   . Heart disease Brother     atrial fib  . Cancer Sister     bone   PE: BP 120/88   Pulse 64   Wt 212 lb (96.2 kg)   SpO2 98%   BMI 25.81 kg/m  Body mass index is 25.81 kg/m. Wt Readings from Last 3 Encounters:  06/20/16 212 lb (96.2 kg)  05/21/16 212 lb 3.2 oz (96.3 kg)  04/23/16 202 lb (91.6 kg)   Constitutional: normal weight, in NAD Eyes: PERRLA, EOMI, no exophthalmos ENT: moist mucous membranes, no thyromegaly, no cervical lymphadenopathy Cardiovascular: RRR, No MRG Respiratory: CTA B Gastrointestinal: abdomen soft, NT, ND, BS+ Musculoskeletal: no deformities, strength intact in all 4 Skin: moist, warm, no rashes Neurological: no tremor with outstretched hands, DTR normal (except L patellar - TKR)  ASSESSMENT: 1. DM2, insulin-dependent, uncontrolled, without long term complications, but with hyperglycemia  PLAN:  1. Patient with long-standing, fairly well controlled diabetes, on metformin, Glipizide, Invokana, and basal insulin (ran out of 70/30 insulin few days ago). He has been having low CBGs after meals on the 70/30 insulin. Will keep the long acting insulin but reduce the dose and add rapid acting insulin.  Will also stop Glipizide. - I advised him to:  Patient Instructions  Please continue: - Metformin XR 1000 mg 2x a day - Invokana 100 mg daily in am before b'fast   Please stop Glipizide.  Please continue Levemir, but decrease the dose to 35 units.  Start Novolog: - 7 units before a smaller meal - 9 units before a larger meal or if you have dessert  Try without NovoLog for lunch for now >> if sugars before dinner > 130 consistently, you may need to add 5-7 units before lunch.  Please return in 1.5 months with your sugar log.   - Continue checking sugars at different times of the day - 3-4 times a day, rotating checks - advised for yearly eye exams >> he is up-to-date  (Dr Prudencio Burly) - had the flu shot this season - reviewed last HbA1c >> 7.5% (higher) - Return to clinic in 1.5 mo with sugar log   Philemon Kingdom, MD PhD [facility] Endocrinology

## 2016-06-23 ENCOUNTER — Telehealth: Payer: Self-pay | Admitting: Pediatrics

## 2016-06-23 NOTE — Telephone Encounter (Signed)
Tarren from [facility] called requesting pt's Novolog FlexPen be change to a 90 day supply vs. 30 day supply.  I changed to 90d w/ 3 refills to equal the 30d w/ 11 refills.

## 2016-07-09 ENCOUNTER — Telehealth: Payer: Self-pay | Admitting: Internal Medicine

## 2016-07-09 NOTE — Telephone Encounter (Signed)
Patient stated that he picked up Novalog flex pen, when he would normally take the Novalog 70/30 mix, he is wondering  If the wrong medication was called in. Because what was sent in is not working. Please advise

## 2016-07-10 ENCOUNTER — Encounter: Payer: Self-pay | Admitting: Internal Medicine

## 2016-07-10 NOTE — Telephone Encounter (Signed)
Patient is scheduled for appointment on 08/01/16

## 2016-07-10 NOTE — Telephone Encounter (Signed)
Please see previous instructions in my last note:  Please continue Levemir, but decrease the dose to 35 units.  Start Novolog: - 7 units before a smaller meal - 9 units before a larger meal or if you have dessert  Try without NovoLog for lunch for now >> if sugars before dinner > 130 consistently, you may need to add 5-7 units before lunch.  Please ask him about his sugars and I will change the NovoLog doses as needed. We started on the lower NovoLog dose on purpose to avoid hypoglycemia. The doses need to be adjusted up if his sugars are high.

## 2016-07-10 NOTE — Telephone Encounter (Signed)
We switched at last visit from 70/30 to straight NovoLog because he was having lows after meals. Let's go ahead and schedule a new appointment for him and ask him to bring his sugar log so I can fully evaluate his glycemic pattern.

## 2016-08-01 ENCOUNTER — Ambulatory Visit (INDEPENDENT_AMBULATORY_CARE_PROVIDER_SITE_OTHER): Payer: PPO | Admitting: Internal Medicine

## 2016-08-01 ENCOUNTER — Encounter: Payer: Self-pay | Admitting: Internal Medicine

## 2016-08-01 VITALS — BP 140/66 | HR 70 | Temp 97.8°F | Resp 15 | Ht 76.0 in | Wt 216.0 lb

## 2016-08-01 DIAGNOSIS — Z794 Long term (current) use of insulin: Secondary | ICD-10-CM | POA: Diagnosis not present

## 2016-08-01 DIAGNOSIS — IMO0001 Reserved for inherently not codable concepts without codable children: Secondary | ICD-10-CM

## 2016-08-01 DIAGNOSIS — E1165 Type 2 diabetes mellitus with hyperglycemia: Secondary | ICD-10-CM | POA: Diagnosis not present

## 2016-08-01 LAB — POCT GLYCOSYLATED HEMOGLOBIN (HGB A1C): Hemoglobin A1C: 6.9

## 2016-08-01 NOTE — Patient Instructions (Addendum)
Please continue: - Metformin XR 1000 mg 2x a day - Invokana 100 mg daily in am before b'fast  - Levemir 35 units at bedtime - Novolog: - 7 units before a smaller meal - 9 units before a larger meal or if you have dessert  If you start having lows again >> you need to decrease Levemir to 32 units.   If you start being more active >> you may need to decrease Levemir to 32 units.  Please return in 3 months with your sugar log.

## 2016-08-01 NOTE — Progress Notes (Signed)
Patient ID: Nathaniel Thomas, male   DOB: 05-01-1951, 65 y.o.   MRN: [id number]  HPI: Nathaniel Thomas is a 65 y.o.-year-old male, returning for f/u for DM2, dx 1999, insulin-dependent since 2008, uncontrolled, without complications. Last visit 1.5 mo ago.  He is in the catastrophic phase of [facility] until January.   Last hemoglobin A1c was: Lab Results  Component Value Date   HGBA1C 7.5 04/23/2016   HGBA1C 7.2 01/22/2016   HGBA1C 7.3 10/11/2015  He had a steroid inj and Prednisone taper in 07/2014.  Pt is on a regimen of: - Metformin XR 1000 mg 2x a day - Invokana 100 mg daily in am before b'fast  - Levemir 35 units at bedtime - Novolog: - 7 units before a smaller meal - 9 units before a larger meal or if you have dessert   Pt checks his sugars 3-4 x a day: - am: 75-143, 161, 174 >> 64, 71-138, 181 >> 60, 82-152, 180, 227 >> 90-197 >> 63, 68-155, 206 - 2h after b'fast: 127-185, 236, 280 >> 115-194, 235 >> 125-187 >> 133-215 >> 86-179, 191 >> 91-157, 222 - before lunch: 80-133, 166 >> 69, 79-163 >> 69-130 >> 67, 96-138, 202 >> 57-115 >> 79-145 - 2h after lunch:160-175 >> 162-206, 220 >> 149-244, 288 >> 108-190 >> 191-227 >> 161, 172 >> 70, 116-160 - before dinner: 98-144 >> 78-183, 228 >> 97-142, 180 >> 90-152, 214 >> 79-138, 156 >> 96-165 (higher this mo) - 2h after dinner: 180-205 >> 156, 176, 178 >> 191-335 >> 173-349 >> 181-236 >> 59-152 >> 150-209 - nighttime: 120-153, 185 >> 107-207, higher with prostatitis  >> n/c >> 146-216 > 44-155 >> 96-198, 220 No lows. Lowest sugar was 64 >> 60 >> 44 >> 59 x1;  he has hypoglycemia awareness at 60.  Highest sugar was 349 >> 200s >> 200s >> 200s.  Pt's meals are: - Breakfast: [place] bacon, 1-2 eggs, butter; protein shake 3x a week; toast - Lunch: salad - large; rotisserie chicken or cheese - Dinner: chicken chilli, brown rice  - Snacks: fruits, fruit cake; nuts; black bean chips  - no CKD, last BUN/creatinine:  Lab Results  Component Value  Date   BUN 22 12/19/2015   CREATININE 1.06 12/19/2015  On Lisinopril - last set of lipids: Lab Results  Component Value Date   CHOL 128 11/09/2015   HDL 36 (L) 11/09/2015   LDLCALC 69 11/09/2015   LDLDIRECT 113.0 02/23/2015   TRIG 117 11/09/2015   CHOLHDL 3.6 11/09/2015  On Pitavastatin (Livalo) - started 08/27/2015, switched from Lipitor, then Pravastatin. - last eye exam was in 12/13/2015 - Dr. Prudencio Burly. No DR. + glaucoma. He will have cataract sx.  - + numbness in his feet.  ROS: Constitutional: no weight gain/loss, no fatigue, no subjective hyperthermia/hypothermia; + nocturia Eyes: no blurry vision, no xerophthalmia ENT: no sore throat, no nodules palpated in throat, no dysphagia/odynophagia, no hoarseness Cardiovascular: no CP/SOB/palpitations/leg swelling Respiratory:no cough/no SOB Gastrointestinal: no N/V/D/C Musculoskeletal: no muscle/joint aches Skin: no rash Neurological: no tremors/+ numbness/no tingling/dizziness  I reviewed pt's medications, allergies, PMH, social hx, family hx, and changes were documented in the history of present illness. Otherwise, unchanged from my initial visit note:  Past Medical History:  Diagnosis Date  . CAD (coronary artery disease)   . Cataract   . CHICKENPOX, HX OF 08/30/2008   Qualifier: Diagnosis of  By: [name], [name]    . Chronic kidney disease   . Diabetes mellitus   .  Glaucoma   . History of urinary calculi 08/30/2008   Qualifier: Diagnosis of  By: [name], Jacelyn Pi   . Hyperlipidemia   . Hypertension   . Renal calculus or stone   . Skin cancer of nose    ears and hands   Past Surgical History:  Procedure Laterality Date  . ANKLE SURGERY     2013/ wears boot cast  . BASAL CELL CARCINOMA EXCISION     hands  . LAMINECTOMY  1983/1988  . Left knee replacement  2002/ 2004  . LITHOTRIPSY     was cancelled/pt passed stone  . MOHS SURGERY     nose  . TONSILLECTOMY     History   Social History  . Marital Status:  Married    Spouse Name: N/A    Number of Children: 2   Occupational History  .      Retired   Social History Main Topics  . Smoking status: Never Smoker   . Smokeless tobacco: Never Used  . Alcohol Use: No  . Drug Use: No   Current Outpatient Prescriptions on File Prior to Visit  Medication Sig Dispense Refill  . aspirin EC 325 MG tablet Take 1 tablet (325 mg total) by mouth daily. 30 tablet 0  . bimatoprost (LUMIGAN) 0.03 % ophthalmic solution Place 1 drop into both eyes at bedtime.    [name] HYDROmorphone (DILAUDID) 4 MG tablet Take 4 mg by mouth every 4 (four) hours as needed. Reported on 01/22/2016    . insulin aspart (NOVOLOG FLEXPEN) 100 UNIT/ML FlexPen Inject 7-9 Units into the skin 3 (three) times daily with meals. 15 mL 11  . Insulin Detemir (LEVEMIR FLEXTOUCH) 100 UNIT/ML Pen Inject 35 Units into the skin daily at 10 pm. 15 mL 11  . Insulin Pen Needle (CAREFINE PEN NEEDLES) 32G X 4 MM MISC Use 3-4x a day 300 each 5  . INVOKANA 100 MG TABS tablet Take 1 tab once a day in am 90 tablet 3  . lisinopril (PRINIVIL,ZESTRIL) 20 MG tablet Take 1 tablet by mouth daily 90 tablet 2  . LUMIGAN 0.01 % SOLN     . metFORMIN (GLUCOPHAGE-XR) 500 MG 24 hr tablet Take 2 tablets by mouth  twice a day 360 tablet 5  . naproxen (NAPROSYN) 500 MG tablet Take 1 tablet by mouth  twice a day with a meal 180 tablet 4  . NON FORMULARY Take 2 tablets by mouth daily. Diabetic vitamin packet 1 daily    . NON FORMULARY Alpha-lopic acid 200 mg-Take one daily    . Nutritional Supplements (PROSTATE ENZYME FORMULA PO) Take by mouth. Take 2 in pm    . ONE TOUCH ULTRA TEST test strip Use to test blood sugar 4  times daily 400 each 1  . Pitavastatin Calcium 4 MG TABS Take 1 tablet (4 mg total) by mouth daily. 90 tablet 3  . tadalafil (CIALIS) 20 MG tablet Take 1 tablet by mouth  daily as needed for   erectile dysfunction. 6 tablet 5  . tamsulosin (FLOMAX) 0.4 MG CAPS capsule Take 0.4 mg by mouth daily after supper.       No current facility-administered medications on file prior to visit.    Allergies  Allergen Reactions  . Demerol Other (See Comments)    Blood pressure dropped completely out  . Meperidine Hcl Other (See Comments)    Drops blood pressure out  . Nitrospan [Nitroglycerin] Other (See Comments)    Blood pressure dropped  out  . Sulfa Antibiotics Other (See Comments)    Urinary burning; blisters    Family History  Problem Relation Age of Onset  . Heart disease Brother     Atrial fibrillation  . Heart disease Father     CHF  . Heart disease Mother   . Heart disease Brother     atrial fib  . Cancer Sister     bone   PE: BP 140/66   Pulse 70   Temp 97.8 F (36.6 C) (Oral)   Resp 15   Ht 6\' 4"  (1.93 m)   Wt 216 lb (98 kg)   BMI 26.29 kg/m  Body mass index is 26.29 kg/m. Wt Readings from Last 3 Encounters:  08/01/16 216 lb (98 kg)  06/20/16 212 lb (96.2 kg)  05/21/16 212 lb 3.2 oz (96.3 kg)   Constitutional: normal weight, in NAD Eyes: PERRLA, EOMI, no exophthalmos ENT: moist mucous membranes, no thyromegaly, no cervical lymphadenopathy Cardiovascular: RRR, No MRG Respiratory: CTA B Gastrointestinal: abdomen soft, NT, ND, BS+ Musculoskeletal: no deformities, strength intact in all 4 Skin: moist, warm, no rashes Neurological: no tremor with outstretched hands, DTR normal (except L patellar - TKR)  ASSESSMENT: 1. DM2, insulin-dependent, uncontrolled, without long term complications, but with hyperglycemia  PLAN:  1. Patient with long-standing, fairly well controlled diabetes, on Metformin, Invokana, and Levemir + Novolog. At last visit, as he had low CBGs after meals >> we stopped 70/30 insulin and switched to a basal-bolus insulin regimen along with his Metformin and Invokana. We stopped Glipizide when we started Novolog. No more severe lows. Had 1 low at 58 after changing to the new regimen, after he was more active. Discussed the need to reduce the insulin if he plans  to be more active. - sugars are less fluctuating, with spikes around the Holidays, but overall better - I advised him: Patient Instructions  Please continue: - Metformin XR 1000 mg 2x a day - Invokana 100 mg daily in am before b'fast  - Levemir 35 units at bedtime - Novolog: - 7 units before a smaller meal - 9 units before a larger meal or if you have dessert  If you start having lows again >> you need to decrease Levemir to 32 units.   If you start being more active >> you may need to decrease Levemir to 32 units.  Please return in 3 months with your sugar log.   - Continue checking sugars at different times of the day - 3-4 times a day, rotating checks - advised for yearly eye exams >> he is up-to-date  (Dr Prudencio Burly) - had the flu shot this season - HbA1c today is better: Lab Results  Component Value Date   HGBA1C 6.9 08/01/2016  - Return to clinic in 3 mo with sugar log   Philemon Kingdom, MD PhD [facility]

## 2016-08-15 ENCOUNTER — Other Ambulatory Visit: Payer: Self-pay

## 2016-08-15 ENCOUNTER — Telehealth: Payer: Self-pay | Admitting: Internal Medicine

## 2016-08-15 MED ORDER — METFORMIN HCL ER 500 MG PO TB24
1000.0000 mg | ORAL_TABLET | Freq: Two times a day (BID) | ORAL | 5 refills | Status: DC
Start: 2016-08-15 — End: 2017-08-10

## 2016-08-15 NOTE — Telephone Encounter (Signed)
Patient need refill of metFORMIN (GLUCOPHAGE-XR) 500 MG 24 hr tablet   [place], [place] [address] [phone] (Phone) [phone] (Fax)

## 2016-09-03 ENCOUNTER — Encounter: Payer: Self-pay | Admitting: Family Medicine

## 2016-09-03 ENCOUNTER — Ambulatory Visit (INDEPENDENT_AMBULATORY_CARE_PROVIDER_SITE_OTHER): Payer: PPO | Admitting: Family Medicine

## 2016-09-03 VITALS — BP 124/75 | HR 88 | Temp 96.9°F | Ht 76.0 in | Wt 218.4 lb

## 2016-09-03 DIAGNOSIS — Z794 Long term (current) use of insulin: Secondary | ICD-10-CM

## 2016-09-03 DIAGNOSIS — I10 Essential (primary) hypertension: Secondary | ICD-10-CM

## 2016-09-03 DIAGNOSIS — E1165 Type 2 diabetes mellitus with hyperglycemia: Secondary | ICD-10-CM

## 2016-09-03 DIAGNOSIS — E785 Hyperlipidemia, unspecified: Secondary | ICD-10-CM | POA: Diagnosis not present

## 2016-09-03 DIAGNOSIS — IMO0001 Reserved for inherently not codable concepts without codable children: Secondary | ICD-10-CM

## 2016-09-03 DIAGNOSIS — E786 Lipoprotein deficiency: Secondary | ICD-10-CM

## 2016-09-03 NOTE — Assessment & Plan Note (Signed)
Gave samples, will recheck labs today.

## 2016-09-03 NOTE — Assessment & Plan Note (Signed)
Blood pressure controlled, continue current medications

## 2016-09-03 NOTE — Progress Notes (Signed)
BP 124/75   Pulse 88   Temp (!) 96.9 F (36.1 C) (Oral)   Ht 6' 4"  (1.93 m)   Wt 218 lb 6.4 oz (99.1 kg)   BMI 26.58 kg/m    Subjective:    Patient ID: Nathaniel Thomas, male    DOB: 12/21/1950, 66 y.o.   MRN: [id number]  HPI: Nathaniel Thomas is a 66 y.o. male presenting on 09/03/2016 for Hyperlipidemia (6 month recheck); Hypertension; and Diabetes   HPI Hypertension recheck Patient is coming today for a blood pressure recheck. His blood pressure today is 124/75. He is currently on the superotemporal 10 mg. Patient denies headaches, blurred vision, chest pains, shortness of breath, or weakness. Denies any side effects from medication and is content with current medication.   Hyperlipidemia Patient is coming in for cholesterol recheck. He is currently on Livalo. He denies any issues with the medication such as myalgias and has been doing relatively very well. He denies any focal numbness or weakness or chest pain or shortness of breath.  Diabetes foot exam Patient is coming in for a diabetes foot exam. He sees an endocrinologist for the rest of his diabetes management but is coming here just to get his foot exam. He denies any new issues with his feet except for occasional swelling of his right foot. Sensation is been intact and he has not noticed any open sores or wounds and really tries to keep close care on his feet.  Relevant past medical, surgical, family and social history reviewed and updated as indicated. Interim medical history since our last visit reviewed. Allergies and medications reviewed and updated.  Review of Systems  Constitutional: Negative for chills and fever.  Respiratory: Negative for shortness of breath and wheezing.   Cardiovascular: Negative for chest pain and leg swelling.  Musculoskeletal: Negative for back pain and gait problem.  Skin: Negative for rash.  Neurological: Negative for dizziness, weakness and numbness.  All other systems reviewed and are  negative.   Per HPI unless specifically indicated above      Objective:    BP 124/75   Pulse 88   Temp (!) 96.9 F (36.1 C) (Oral)   Ht 6' 4"  (1.93 m)   Wt 218 lb 6.4 oz (99.1 kg)   BMI 26.58 kg/m   Wt Readings from Last 3 Encounters:  09/03/16 218 lb 6.4 oz (99.1 kg)  08/01/16 216 lb (98 kg)  06/20/16 212 lb (96.2 kg)    Physical Exam  Constitutional: He is oriented to person, place, and time. He appears well-developed and well-nourished. No distress.  Eyes: Conjunctivae are normal. Right eye exhibits no discharge. Left eye exhibits no discharge. No scleral icterus.  Cardiovascular: Normal rate, regular rhythm, normal heart sounds and intact distal pulses.   No murmur heard. Pulmonary/Chest: Effort normal and breath sounds normal. No respiratory distress. He has no wheezes. He has no rales.  Musculoskeletal: Normal range of motion. He exhibits no edema.  Neurological: He is alert and oriented to person, place, and time. Coordination normal.  Skin: Skin is warm and dry. No rash noted. He is not diaphoretic.  Psychiatric: He has a normal mood and affect. His behavior is normal.  Nursing note and vitals reviewed.   Results for orders placed or performed in visit on 08/01/16  POCT glycosylated hemoglobin (Hb A1C)  Result Value Ref Range   Hemoglobin A1C 6.9       Assessment & Plan:   Problem List Items Addressed  This Visit      Cardiovascular and Mediastinum   Essential hypertension - Primary    Blood pressure controlled, continue current medications      Relevant Orders   CMP14+EGFR     Endocrine   Uncontrolled type 2 diabetes mellitus without complication, with long-term current use of insulin ([place])    Sees endocrinology normally for his management but comes here for his diabetic yearly foot exam. Everything looks good on that. He will continue to see them for it. Gave him some samples of Jardiance because we didn't have a double, and Livalo        Other    Hyperlipidemia with low HDL    Gave samples, will recheck labs today.      Relevant Orders   Lipid panel       Follow up plan: Return in about 6 months (around 03/03/2017), or if symptoms worsen or fail to improve, for Follow-up hypertension and cholesterol.  Counseling provided for all of the vaccine components Orders Placed This Encounter  Procedures  . CMP14+EGFR  . Lipid panel    Caryl Pina, MD [facility] 09/03/2016, 10:36 AM

## 2016-09-03 NOTE — Assessment & Plan Note (Signed)
Sees endocrinology normally for his management but comes here for his diabetic yearly foot exam. Everything looks good on that. He will continue to see them for it. Gave him some samples of Jardiance because we didn't have a double, and Livalo

## 2016-09-04 LAB — CMP14+EGFR
ALT: 21 IU/L (ref 0–44)
AST: 21 IU/L (ref 0–40)
Albumin/Globulin Ratio: 1.7 (ref 1.2–2.2)
Albumin: 4.5 g/dL (ref 3.6–4.8)
Alkaline Phosphatase: 54 IU/L (ref 39–117)
BUN/Creatinine Ratio: 17 (ref 10–24)
BUN: 17 mg/dL (ref 8–27)
Bilirubin Total: 0.3 mg/dL (ref 0.0–1.2)
CO2: 22 mmol/L (ref 18–29)
Calcium: 9.7 mg/dL (ref 8.6–10.2)
Chloride: 100 mmol/L (ref 96–106)
Creatinine, Ser: 0.98 mg/dL (ref 0.76–1.27)
GFR calc Af Amer: 92 mL/min/{1.73_m2} (ref >59)
GFR calc non Af Amer: 80 mL/min/{1.73_m2} (ref >59)
Globulin, Total: 2.6 g/dL (ref 1.5–4.5)
Glucose: 100 mg/dL — ABNORMAL HIGH (ref 65–99)
Potassium: 5 mmol/L (ref 3.5–5.2)
Sodium: 139 mmol/L (ref 134–144)
Total Protein: 7.1 g/dL (ref 6.0–8.5)

## 2016-09-04 LAB — LIPID PANEL
Chol/HDL Ratio: 4.6 ratio units (ref 0.0–5.0)
Cholesterol, Total: 170 mg/dL (ref 100–199)
HDL: 37 mg/dL — ABNORMAL LOW (ref >39)
LDL Calculated: 86 mg/dL (ref 0–99)
Triglycerides: 233 mg/dL — ABNORMAL HIGH (ref 0–149)
VLDL Cholesterol Cal: 47 mg/dL — ABNORMAL HIGH (ref 5–40)

## 2016-09-12 ENCOUNTER — Other Ambulatory Visit: Payer: Self-pay

## 2016-09-12 ENCOUNTER — Telehealth: Payer: Self-pay | Admitting: Internal Medicine

## 2016-09-12 MED ORDER — INSULIN ASPART 100 UNIT/ML FLEXPEN: 7.0000 [IU] | PEN_INJECTOR | Freq: Three times a day (TID) | SUBCUTANEOUS | 11 refills | Status: DC

## 2016-09-12 MED ORDER — INSULIN DETEMIR 100 UNIT/ML FLEXPEN: 35.0000 [IU] | PEN_INJECTOR | Freq: Every day | SUBCUTANEOUS | 11 refills | Status: DC

## 2016-09-12 NOTE — Telephone Encounter (Signed)
rx submitted.  

## 2016-09-12 NOTE — Telephone Encounter (Signed)
Pt needs his Novolog and Levemir sent to the local pharmacy instead of [facility].  It will now be the [facility] in [place].

## 2016-09-15 ENCOUNTER — Telehealth: Payer: Self-pay | Admitting: Internal Medicine

## 2016-09-15 ENCOUNTER — Other Ambulatory Visit: Payer: Self-pay

## 2016-09-15 MED ORDER — INSULIN ASPART 100 UNIT/ML FLEXPEN: 7.0000 [IU] | PEN_INJECTOR | Freq: Three times a day (TID) | SUBCUTANEOUS | 1 refills | Status: DC

## 2016-09-15 NOTE — Telephone Encounter (Signed)
Submitted

## 2016-09-15 NOTE — Telephone Encounter (Signed)
patient need a 90 day supply of   insulin aspart (NOVOLOG FLEXPEN) 100 UNIT/ML FlexPen

## 2016-09-16 ENCOUNTER — Other Ambulatory Visit: Payer: Self-pay

## 2016-09-16 ENCOUNTER — Encounter: Payer: Self-pay | Admitting: Internal Medicine

## 2016-09-16 MED ORDER — INSULIN DETEMIR 100 UNIT/ML FLEXPEN: 35.0000 [IU] | PEN_INJECTOR | Freq: Every day | SUBCUTANEOUS | 1 refills | Status: DC

## 2016-09-16 MED ORDER — INSULIN ASPART 100 UNIT/ML FLEXPEN: 7.0000 [IU] | PEN_INJECTOR | Freq: Three times a day (TID) | SUBCUTANEOUS | 1 refills | Status: DC

## 2016-09-18 DIAGNOSIS — D0439 Carcinoma in situ of skin of other parts of face: Secondary | ICD-10-CM | POA: Diagnosis not present

## 2016-09-18 DIAGNOSIS — D224 Melanocytic nevi of scalp and neck: Secondary | ICD-10-CM | POA: Diagnosis not present

## 2016-09-18 DIAGNOSIS — L570 Actinic keratosis: Secondary | ICD-10-CM | POA: Diagnosis not present

## 2016-09-29 ENCOUNTER — Other Ambulatory Visit: Payer: Self-pay

## 2016-09-29 ENCOUNTER — Other Ambulatory Visit: Payer: Self-pay | Admitting: Family Medicine

## 2016-09-29 MED ORDER — INVOKANA 100 MG PO TABS: ORAL_TABLET | ORAL | 3 refills | Status: DC

## 2016-09-29 MED ORDER — NAPROXEN 500 MG PO TABS: ORAL_TABLET | ORAL | 0 refills | Status: DC

## 2016-09-29 NOTE — Telephone Encounter (Signed)
invokana 100 mg called into [place] [facility] please

## 2016-09-29 NOTE — Telephone Encounter (Signed)
What is the name of the medication? naproxen (NAPROSYN) 500 MG tablet  Have you contacted your pharmacy to request a refill? No because this is a rx that was sent out by mail order by another dr.  [facility] pharmacy would you like this sent to? Please use [facility] in [place].   Patient notified that their request is being sent to the clinical staff for review and that they should receive a call once it is complete. If they do not receive a call within 24 hours they can check with their pharmacy or our office.

## 2016-09-29 NOTE — Telephone Encounter (Signed)
Yes I am okay to go ahead and send this refill.

## 2016-10-02 ENCOUNTER — Other Ambulatory Visit: Payer: Self-pay

## 2016-10-02 DIAGNOSIS — R972 Elevated prostate specific antigen [PSA]: Secondary | ICD-10-CM | POA: Diagnosis not present

## 2016-10-02 MED ORDER — GLUCOSE BLOOD VI STRP: ORAL_STRIP | 1 refills | Status: DC

## 2016-10-02 NOTE — Telephone Encounter (Signed)
Pt needs test strips called into [facility] in [place]

## 2016-10-09 DIAGNOSIS — R972 Elevated prostate specific antigen [PSA]: Secondary | ICD-10-CM | POA: Diagnosis not present

## 2016-10-09 DIAGNOSIS — Z87442 Personal history of urinary calculi: Secondary | ICD-10-CM | POA: Diagnosis not present

## 2016-10-27 ENCOUNTER — Encounter: Payer: Self-pay | Admitting: Internal Medicine

## 2016-10-28 DIAGNOSIS — R972 Elevated prostate specific antigen [PSA]: Secondary | ICD-10-CM | POA: Diagnosis not present

## 2016-10-30 ENCOUNTER — Ambulatory Visit: Payer: PPO | Admitting: Internal Medicine

## 2016-11-04 ENCOUNTER — Ambulatory Visit: Payer: PPO

## 2016-11-04 DIAGNOSIS — C61 Malignant neoplasm of prostate: Secondary | ICD-10-CM | POA: Diagnosis not present

## 2016-11-10 ENCOUNTER — Encounter: Payer: Self-pay | Admitting: Internal Medicine

## 2016-11-11 ENCOUNTER — Other Ambulatory Visit: Payer: Self-pay

## 2016-11-11 MED ORDER — INSULIN PEN NEEDLE 32G X 4 MM MISC: 5 refills | Status: DC

## 2016-12-16 ENCOUNTER — Ambulatory Visit (INDEPENDENT_AMBULATORY_CARE_PROVIDER_SITE_OTHER): Payer: PPO

## 2016-12-16 ENCOUNTER — Ambulatory Visit (INDEPENDENT_AMBULATORY_CARE_PROVIDER_SITE_OTHER): Payer: PPO | Admitting: Podiatry

## 2016-12-16 ENCOUNTER — Encounter: Payer: Self-pay | Admitting: Podiatry

## 2016-12-16 VITALS — BP 131/78 | HR 62 | Resp 16 | Ht 76.0 in | Wt 210.0 lb

## 2016-12-16 DIAGNOSIS — Z981 Arthrodesis status: Secondary | ICD-10-CM | POA: Diagnosis not present

## 2016-12-16 DIAGNOSIS — M216X2 Other acquired deformities of left foot: Secondary | ICD-10-CM

## 2016-12-16 DIAGNOSIS — M79672 Pain in left foot: Principal | ICD-10-CM

## 2016-12-16 DIAGNOSIS — M79671 Pain in right foot: Secondary | ICD-10-CM

## 2016-12-16 DIAGNOSIS — M25571 Pain in right ankle and joints of right foot: Principal | ICD-10-CM

## 2016-12-16 DIAGNOSIS — G8929 Other chronic pain: Secondary | ICD-10-CM | POA: Diagnosis not present

## 2016-12-16 DIAGNOSIS — M216X1 Other acquired deformities of right foot: Secondary | ICD-10-CM | POA: Diagnosis not present

## 2016-12-16 NOTE — Progress Notes (Signed)
   Subjective:    Patient ID: Nathaniel Thomas, male    DOB: [birth year], 66 y.o.   MRN: [id number]  HPI: He presents today for a pair of orthotics to be fabricated. He would like to have them made by [facility] who made them previously. Is complaining of ankle pain right. He states that his heel hurts in his medial longitudinal arch hurts to this right foot.    Review of Systems  Eyes: Positive for visual disturbance.  Genitourinary: Positive for frequency and urgency.  Musculoskeletal: Positive for arthralgias.  Neurological: Positive for light-headedness.  All other systems reviewed and are negative.      Objective:   Physical Exam: Vital signs are stable he is alert and oriented 3 pulses are palpable. Neurologic systems intact. Degenerative flexors are intact. Muscle strength is normal bilateral. Orthopedic evaluation was resolved with distal ankle range of motion without crepitation. Gait evaluation well-hydrated cutis no erythema edema cellulitis drainage or odor is noted he does have limitation on range of motion of the right ankle with mild cavus deformity. Radiographs do demonstrate open reduction internal fixation of the right ankle with fusion of the ankle joint which failed fusion completely he also has a fifth metatarsal fracture that has failed to fuse completely.        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  Plan: He was scanned for orthotics.

## 2016-12-31 ENCOUNTER — Other Ambulatory Visit: Payer: Self-pay | Admitting: Family Medicine

## 2016-12-31 NOTE — Telephone Encounter (Signed)
Last seen in JAN 2018 dettinger - please address refill

## 2017-01-01 ENCOUNTER — Encounter: Payer: Self-pay | Admitting: Internal Medicine

## 2017-01-01 DIAGNOSIS — H524 Presbyopia: Secondary | ICD-10-CM | POA: Diagnosis not present

## 2017-01-01 DIAGNOSIS — H2513 Age-related nuclear cataract, bilateral: Secondary | ICD-10-CM | POA: Diagnosis not present

## 2017-01-01 DIAGNOSIS — H401111 Primary open-angle glaucoma, right eye, mild stage: Secondary | ICD-10-CM | POA: Diagnosis not present

## 2017-01-01 LAB — HM DIABETES EYE EXAM

## 2017-01-05 ENCOUNTER — Ambulatory Visit (INDEPENDENT_AMBULATORY_CARE_PROVIDER_SITE_OTHER): Payer: PPO | Admitting: Internal Medicine

## 2017-01-05 ENCOUNTER — Encounter: Payer: Self-pay | Admitting: Internal Medicine

## 2017-01-05 VITALS — BP 124/62 | HR 82 | Ht 76.0 in | Wt 216.0 lb

## 2017-01-05 DIAGNOSIS — E1165 Type 2 diabetes mellitus with hyperglycemia: Secondary | ICD-10-CM | POA: Diagnosis not present

## 2017-01-05 DIAGNOSIS — Z794 Long term (current) use of insulin: Secondary | ICD-10-CM | POA: Diagnosis not present

## 2017-01-05 DIAGNOSIS — IMO0001 Reserved for inherently not codable concepts without codable children: Secondary | ICD-10-CM

## 2017-01-05 LAB — POCT GLYCOSYLATED HEMOGLOBIN (HGB A1C): Hemoglobin A1C: 7.1

## 2017-01-05 MED ORDER — INSULIN DETEMIR 100 UNIT/ML FLEXPEN: 35.0000 [IU] | PEN_INJECTOR | Freq: Every day | SUBCUTANEOUS | 3 refills | Status: DC

## 2017-01-05 MED ORDER — INSULIN ASPART 100 UNIT/ML FLEXPEN: 7.0000 [IU] | PEN_INJECTOR | Freq: Three times a day (TID) | SUBCUTANEOUS | 3 refills | Status: DC

## 2017-01-05 NOTE — Addendum Note (Signed)
Addended by: Caprice Beaver T on: 01/05/2017 09:31 AM   Modules accepted: Orders

## 2017-01-05 NOTE — Progress Notes (Signed)
Patient ID: Nathaniel Thomas, male   DOB: 1951/03/20, 66 y.o.   MRN: [id number]  HPI: Nathaniel Thomas is a 66 y.o.-year-old male, returning for f/u for DM2, dx 1999, insulin-dependent since 2008, uncontrolled, without complications. Last visit 5.5 mo ago.   He was dx'ed with PrCA >> started Finasteride to first decrease the Pr size >> will then have Rx seeds. Next visit with urologist: in 4 mo.  Last hemoglobin A1c was: Lab Results  Component Value Date   HGBA1C 6.9 08/01/2016   HGBA1C 7.5 04/23/2016   HGBA1C 7.2 01/22/2016  He had a steroid inj and Prednisone taper in 07/2014.  Pt is on a regimen of: - Metformin XR 1000 mg 2x a day - Invokana 100 mg daily in am before b'fast  - Levemir 35 units at bedtime - Novolog: - 7 units before a smaller meal - 9 units before a larger meal or if you have dessert   Pt checks his sugars 3-4x a day (per log): - am: 60, 82-152, 180, 227 >> 90-197 >> 63, 68-155, 206 >> 86-158, 172 - 2h after b'fast:133-215 >> 86-179, 191 >> 91-157, 222 >> 72-172 - before lunch:  67, 96-138, 202 >> 57-115 >> 79-145 >> 87-154, 177 - 2h after lunch:  191-227 >> 161, 172 >> 70, 116-160 >> 84-190 - before dinner:90-152, 214 >> 79-138, 156 >> 96-165 >> 69, 80-150, 178 - 2h after dinner: 173-349 >> 181-236 >> 59-152 >> 150-209 >> 178-218 - nighttime:n/c >> 146-216 > 44-155 >> 96-198, 220 >> 78-183, 62 x1 No lows. Lowest sugar was 44 >> 59 x1 >> 62;  he has hypoglycemia awareness at 60.  Highest sugar was 200s >> 184.  Pt's meals are: - Breakfast: [place] bacon, 1-2 eggs, butter; protein shake 3x a week; toast - Lunch: salad - large; rotisserie chicken or cheese - Dinner: chicken chilli, brown rice  - Snacks: fruits, fruit cake; nuts; black bean chips  - No CKD, last BUN/creatinine:  Lab Results  Component Value Date   BUN 17 09/03/2016   CREATININE 0.98 09/03/2016  On Lisinopril. - last set of lipids: Lab Results  Component Value Date   CHOL 170 09/03/2016   HDL  37 (L) 09/03/2016   LDLCALC 86 09/03/2016   LDLDIRECT 113.0 02/23/2015   TRIG 233 (H) 09/03/2016   CHOLHDL 4.6 09/03/2016  On Livalo- started 08/27/2015, switched from Lipitor, then Pravastatin. - last eye exam was in 01/01/2017 - Dr. Prudencio Burly. No DR. + glaucoma. He will have cataract sx.on 06/12.  - he does have numbness in his feet. He is using CBD oil >> helped with the pain tremendously.  ROS: Constitutional: no weight gain/no weight loss, no fatigue, no subjective hyperthermia, no subjective hypothermia Eyes: no blurry vision, no xerophthalmia ENT: no sore throat, no nodules palpated in throat, no dysphagia, no odynophagia, no hoarseness Cardiovascular: no CP/no SOB/no palpitations/no leg swelling Respiratory: no cough/no SOB/no wheezing Gastrointestinal: no N/no V/no D/no C/no acid reflux Musculoskeletal: no muscle aches/no joint aches Skin: no rashes, no hair loss Neurological: no tremors/+ numbness in feet/no tingling/no dizziness  I reviewed pt's medications, allergies, PMH, social hx, family hx, and changes were documented in the history of present illness. Otherwise, unchanged from my initial visit note.   Past Medical History:  Diagnosis Date  . CAD (coronary artery disease)   . Cataract   . CHICKENPOX, HX OF 08/30/2008   Qualifier: Diagnosis of  By: [name], [name]    . Chronic kidney  disease   . Diabetes mellitus   . Glaucoma   . History of urinary calculi 08/30/2008   Qualifier: Diagnosis of  By: [name], Jacelyn Pi   . Hyperlipidemia   . Hypertension   . Renal calculus or stone   . Skin cancer of nose    ears and hands   Past Surgical History:  Procedure Laterality Date  . ANKLE SURGERY     2013/ wears boot cast  . BASAL CELL CARCINOMA EXCISION     hands  . LAMINECTOMY  1983/1988  . Left knee replacement  2002/ 2004  . LITHOTRIPSY     was cancelled/pt passed stone  . MOHS SURGERY     nose  . TONSILLECTOMY     History   Social History  . Marital  Status: Married    Spouse Name: N/A    Number of Children: 2   Occupational History  .      Retired   Social History Main Topics  . Smoking status: Never Smoker   . Smokeless tobacco: Never Used  . Alcohol Use: No  . Drug Use: No   Current Outpatient Prescriptions on File Prior to Visit  Medication Sig Dispense Refill  . aspirin EC 325 MG tablet Take 1 tablet (325 mg total) by mouth daily. 30 tablet 0  . bimatoprost (LUMIGAN) 0.03 % ophthalmic solution Place 1 drop into both eyes at bedtime.    [name] glucose blood (ONE TOUCH ULTRA TEST) test strip Use to test blood sugar 4  times daily 400 each 1  . HYDROmorphone (DILAUDID) 4 MG tablet Take 4 mg by mouth every 4 (four) hours as needed for severe pain. Reported on 01/22/2016    . insulin aspart (NOVOLOG FLEXPEN) 100 UNIT/ML FlexPen Inject 7-9 Units into the skin 3 (three) times daily with meals. 10 pen 1  . Insulin Detemir (LEVEMIR FLEXTOUCH) 100 UNIT/ML Pen Inject 35 Units into the skin daily at 10 pm. 15 pen 1  . Insulin Pen Needle (CAREFINE PEN NEEDLES) 32G X 4 MM MISC Use 3-4x a day 400 each 5  . INVOKANA 100 MG TABS tablet Take 1 tab once a day in am 90 tablet 3  . lisinopril (PRINIVIL,ZESTRIL) 20 MG tablet Take 1 tablet by mouth daily 90 tablet 2  . LUMIGAN 0.01 % SOLN     . metFORMIN (GLUCOPHAGE-XR) 500 MG 24 hr tablet Take 2 tablets (1,000 mg total) by mouth 2 (two) times daily. 360 tablet 5  . naproxen (NAPROSYN) 500 MG tablet TAKE 1 TABLET BY MOUTH TWICE DAILY WITH MEALS 180 tablet 0  . NON FORMULARY Take 2 tablets by mouth daily. Diabetic vitamin packet 1 daily    . NON FORMULARY Alpha-lopic acid 200 mg-Take one daily    . Nutritional Supplements (PROSTATE ENZYME FORMULA PO) Take by mouth. Take 2 in pm    . Pitavastatin Calcium 4 MG TABS Take 1 tablet (4 mg total) by mouth daily. 90 tablet 3  . tadalafil (CIALIS) 20 MG tablet Take 1 tablet by mouth  daily as needed for   erectile dysfunction. 6 tablet 5  . tamsulosin (FLOMAX)  0.4 MG CAPS capsule Take 0.4 mg by mouth daily after supper.      No current facility-administered medications on file prior to visit.    Allergies  Allergen Reactions  . Demerol Other (See Comments)    Blood pressure dropped completely out  . Meperidine Hcl Other (See Comments)    Drops blood pressure  out  . Jerold Coombe [Nitroglycerin] Other (See Comments)    Blood pressure dropped out  . Sulfa Antibiotics Other (See Comments)    Urinary burning; blisters    Family History  Problem Relation Age of Onset  . Heart disease Brother        Atrial fibrillation  . Heart disease Father        CHF  . Heart disease Mother   . Heart disease Brother        atrial fib  . Cancer Sister        bone   PE: There were no vitals taken for this visit. There is no height or weight on file to calculate BMI. Wt Readings from Last 3 Encounters:  12/16/16 210 lb (95.3 kg)  09/03/16 218 lb 6.4 oz (99.1 kg)  08/01/16 216 lb (98 kg)   Constitutional: overweight, in NAD Eyes: PERRLA, EOMI, no exophthalmos ENT: moist mucous membranes, no thyromegaly, no cervical lymphadenopathy Cardiovascular: RRR, No MRG Respiratory: CTA B Gastrointestinal: abdomen soft, NT, ND, BS+ Musculoskeletal: no deformities, strength intact in all 4 Skin: moist, warm, no rashes Neurological: no tremor with outstretched hands, DTR normal in all 4 (except L patellar - TKR)  ASSESSMENT: 1. DM2, insulin-dependent, uncontrolled, without long term complications, but with hyperglycemia  PLAN:  1. Patient with long-standing, fairly well controlled diabetes, on po meds + basal-bolus insulin regimen. His sugars were a little higher around his PrCa dx and as he was out of town, now better. However, they are still fluctuating - depending on activity (plays golf) and diet..  - no need to change the regimen for now.  - I advised him to: Patient Instructions  Please continue: - Metformin XR 1000 mg 2x a day - Invokana 100 mg daily in  am before b'fast  - Levemir 35 units at bedtime - Novolog: - 7 units before a smaller meal - 9 units before a larger meal or if you have dessert  Please return in 3-4 months with your sugar log.   - today, HbA1c is 7.1% (higher) - continue checking sugars at different times of the day - check 3-4x a day, rotating checks >> he does a great job with this! - advised for yearly eye exams >> he is UTD - Return to clinic in 3-4 mo with sugar log   Philemon Kingdom, MD PhD [facility] Endocrinology

## 2017-01-05 NOTE — Patient Instructions (Addendum)
Please continue: - Metformin XR 1000 mg 2x a day - Invokana 100 mg daily in am before b'fast  - Levemir 35 units at bedtime - Novolog: - 7 units before a smaller meal - 9 units before a larger meal or if you have dessert  Please return in 3-4 months with your sugar log.

## 2017-01-06 ENCOUNTER — Encounter: Payer: PPO | Admitting: Orthotics

## 2017-01-07 ENCOUNTER — Encounter: Payer: PPO | Admitting: Orthotics

## 2017-01-12 ENCOUNTER — Telehealth: Payer: Self-pay | Admitting: Cardiology

## 2017-01-12 DIAGNOSIS — I2510 Atherosclerotic heart disease of native coronary artery without angina pectoris: Secondary | ICD-10-CM

## 2017-01-12 MED ORDER — LISINOPRIL 20 MG PO TABS: 20.0000 mg | ORAL_TABLET | Freq: Every day | ORAL | 0 refills | Status: DC

## 2017-01-12 NOTE — Telephone Encounter (Signed)
Refill sent to the pharmacy electronically.  

## 2017-01-12 NOTE — Telephone Encounter (Signed)
New message     *STAT* If patient is at the pharmacy, call can be transferred to refill team.   1. Which medications need to be refilled? (please list name of each medication and dose if known) lisinopril (PRINIVIL,ZESTRIL) 20 MG tablet  2. Which pharmacy/location (including street and city if local pharmacy) is medication to [facility] sent to?[facility] [place]    3. Do they need a 30 day or 90 day supply? 90 days supply

## 2017-01-13 DIAGNOSIS — H21562 Pupillary abnormality, left eye: Secondary | ICD-10-CM | POA: Diagnosis not present

## 2017-01-13 DIAGNOSIS — H2511 Age-related nuclear cataract, right eye: Secondary | ICD-10-CM | POA: Diagnosis not present

## 2017-01-13 DIAGNOSIS — H2181 Floppy iris syndrome: Secondary | ICD-10-CM | POA: Diagnosis not present

## 2017-01-13 DIAGNOSIS — H25812 Combined forms of age-related cataract, left eye: Secondary | ICD-10-CM | POA: Diagnosis not present

## 2017-01-13 DIAGNOSIS — H2512 Age-related nuclear cataract, left eye: Secondary | ICD-10-CM | POA: Diagnosis not present

## 2017-01-14 ENCOUNTER — Encounter: Payer: Self-pay | Admitting: Internal Medicine

## 2017-01-15 ENCOUNTER — Encounter: Payer: Self-pay | Admitting: Internal Medicine

## 2017-01-28 ENCOUNTER — Ambulatory Visit: Payer: PPO | Admitting: Orthotics

## 2017-02-02 ENCOUNTER — Ambulatory Visit: Payer: PPO | Admitting: Orthotics

## 2017-02-03 DIAGNOSIS — T1512XA Foreign body in conjunctival sac, left eye, initial encounter: Secondary | ICD-10-CM | POA: Diagnosis not present

## 2017-02-25 ENCOUNTER — Other Ambulatory Visit: Payer: Self-pay

## 2017-02-25 ENCOUNTER — Telehealth: Payer: Self-pay | Admitting: Internal Medicine

## 2017-02-25 MED ORDER — GLUCOSE BLOOD VI STRP: ORAL_STRIP | 1 refills | Status: DC

## 2017-02-25 NOTE — Telephone Encounter (Signed)
Patient was advised of refill that was submitted.

## 2017-02-25 NOTE — Telephone Encounter (Signed)
Called and notified patient of refill that was submitted.

## 2017-02-25 NOTE — Telephone Encounter (Signed)
**  Remind patient they can make refill requests via MyChart**  Medication refill request (Name & Dosage): glucose blood (ONE TOUCH ULTRA TEST) test strip   Preferred pharmacy (Name & Address): [facility] [address] [place], [place] [address] [phone]     Other comments (if applicable):   Patient needs this RX and all future RX to go to this pharmacy   Please call patient and advise when filled. OK to leave message if no answer.

## 2017-02-26 ENCOUNTER — Other Ambulatory Visit: Payer: Self-pay

## 2017-02-26 MED ORDER — INSULIN PEN NEEDLE 32G X 4 MM MISC: 5 refills | Status: DC

## 2017-02-26 NOTE — Telephone Encounter (Signed)
**  Remind patient they can make refill requests via MyChart**  Medication refill request (Name & Dosage):  Insulin Pen Needle (Carefine Pen Needles) 32G x 4 MM MISC  Preferred pharmacy (Name & Address):  [facility] [address], [place]  Other comments (if applicable):

## 2017-02-26 NOTE — Telephone Encounter (Signed)
Submitted

## 2017-02-27 ENCOUNTER — Telehealth: Payer: Self-pay | Admitting: Internal Medicine

## 2017-02-27 NOTE — Telephone Encounter (Signed)
**  Remind patient they can make refill requests via MyChart**  Medication refill request (Name & Dosage):  Insulin Detemir (LEVEMIR FLEXTOUCH) 100 UNIT/ML Pen  Preferred pharmacy (Name & Address):  [facility] [address] [place] [phone] (Phone) [phone] (Fax)    Other comments (if applicable):

## 2017-03-02 ENCOUNTER — Other Ambulatory Visit: Payer: Self-pay

## 2017-03-02 MED ORDER — INSULIN DETEMIR 100 UNIT/ML FLEXPEN: 35.0000 [IU] | PEN_INJECTOR | Freq: Every day | SUBCUTANEOUS | 3 refills | Status: DC

## 2017-03-02 NOTE — Telephone Encounter (Signed)
Submitted

## 2017-03-09 ENCOUNTER — Ambulatory Visit: Payer: PPO | Admitting: Orthotics

## 2017-03-09 DIAGNOSIS — M79671 Pain in right foot: Secondary | ICD-10-CM

## 2017-03-09 DIAGNOSIS — M79672 Pain in left foot: Principal | ICD-10-CM

## 2017-03-09 NOTE — Progress Notes (Signed)
Patient came in today to pick up custom made foot orthotics.  The goals were accomplished and the patient reported no dissatisfaction with said orthotics.  Patient was advised of breakin period and how to report any issues. 

## 2017-04-09 ENCOUNTER — Encounter: Payer: Self-pay | Admitting: Internal Medicine

## 2017-04-09 ENCOUNTER — Ambulatory Visit (INDEPENDENT_AMBULATORY_CARE_PROVIDER_SITE_OTHER): Payer: PPO | Admitting: Internal Medicine

## 2017-04-09 VITALS — BP 124/76 | Temp 97.0°F | Wt 219.6 lb

## 2017-04-09 DIAGNOSIS — Z794 Long term (current) use of insulin: Secondary | ICD-10-CM | POA: Diagnosis not present

## 2017-04-09 DIAGNOSIS — E1165 Type 2 diabetes mellitus with hyperglycemia: Secondary | ICD-10-CM | POA: Diagnosis not present

## 2017-04-09 DIAGNOSIS — E784 Other hyperlipidemia: Secondary | ICD-10-CM | POA: Diagnosis not present

## 2017-04-09 DIAGNOSIS — IMO0001 Reserved for inherently not codable concepts without codable children: Secondary | ICD-10-CM

## 2017-04-09 DIAGNOSIS — E7849 Other hyperlipidemia: Secondary | ICD-10-CM

## 2017-04-09 LAB — POCT GLYCOSYLATED HEMOGLOBIN (HGB A1C): Hemoglobin A1C: 7.3

## 2017-04-09 MED ORDER — INSULIN DETEMIR 100 UNIT/ML FLEXPEN: 40.0000 [IU] | PEN_INJECTOR | Freq: Every day | SUBCUTANEOUS | 5 refills | Status: DC

## 2017-04-09 MED ORDER — INSULIN ASPART 100 UNIT/ML FLEXPEN: 9.0000 [IU] | PEN_INJECTOR | Freq: Three times a day (TID) | SUBCUTANEOUS | 5 refills | Status: DC

## 2017-04-09 NOTE — Patient Instructions (Addendum)
Please continue: - Metformin XR 1000 mg 2x a day - Invokana 100 mg daily in am before b'fast  - Levemir 35-40 units at bedtime - Novolog: - 9 units before a smaller meal - 12 units before a larger meal or if you have dessert  Please return in 3-4 months with your sugar log.

## 2017-04-09 NOTE — Progress Notes (Signed)
Patient ID: Nathaniel Thomas, male   DOB: 1951/05/02, 66 y.o.   MRN: [id number]  HPI: Nathaniel Thomas is a 66 y.o.-year-old male, returning for f/u for DM2, dx 1999, insulin-dependent since 2008, uncontrolled, without long-term complications. Last visit 3 mo ago.   Sugars were higher after started eye drops with steroids this summer >> we increased Novolog and split Levemir.  He gained almost 10 lbs since last visit (less exercise, despite the fact that he stayed active). He plans to go back to the [employer]. He has one very close to his home.  Last hemoglobin A1c was: Lab Results  Component Value Date   HGBA1C 7.1 01/05/2017   HGBA1C 6.9 08/01/2016   HGBA1C 7.5 04/23/2016  He had a steroid inj and Prednisone taper in 07/2014.  Pt is on a regimen of: - Metformin XR 1000 mg 2x a day - Invokana 100 mg daily in am before b'fast  - Levemir 35-40 units at bedtime - Novolog: - 9 units before a smaller meal - 12 units before a larger meal or if you have dessert  Pt checks his sugars 3-4x day - we reviewed his log together: - am: 60, 82-152, 180, 227 >> 90-197 >> 63, 68-155, 206 >> 86-158, 172 >> 86, 106-177, 182, 195 - 2h after b'fast:133-215 >> 86-179, 191 >> 91-157, 222 >> 72-172 >> 75, 123-151 - before lunch:  67, 96-138, 202 >> 57-115 >> 79-145 >> 87-154, 177 >> 56, 69-156, 181 - 2h after lunch:  191-227 >> 161, 172 >> 70, 116-160 >> 84-190 >> 102-193 - before dinner:90-152, 214 >> 79-138, 156 >> 96-165 >> 69, 80-150, 178 >> 62-190, 283 - 2h after dinner: 173-349 >> 181-236 >> 59-152 >> 150-209 >> 178-218 >> 79-136, 210 - nighttime:n/c >> 146-216 > 44-155 >> 96-198, 220 >> 78-183, 62 x1 >> 55 x1 No lows. Lowest sugar was 44 >> 59 x1 >> 62 >> 55 at night, 56 before lunch;  he has hypoglycemia awareness at 60.  Highest sugar was 200s >> 184 >> 283.  Pt's meals are: - Breakfast: [place] bacon, 1-2 eggs, butter; protein shake 3x a week; toast - Lunch: salad - large; rotisserie chicken or cheese -  Dinner: chicken chilli, brown rice  - Snacks: fruits, fruit cake; nuts; black bean chips  - No CKD, last BUN/creatinine:  Lab Results  Component Value Date   BUN 17 09/03/2016   CREATININE 0.98 09/03/2016  On lisinopril. - last set of lipids: Lab Results  Component Value Date   CHOL 170 09/03/2016   HDL 37 (L) 09/03/2016   LDLCALC 86 09/03/2016   LDLDIRECT 113.0 02/23/2015   TRIG 233 (H) 09/03/2016   CHOLHDL 4.6 09/03/2016  On Livalo since 08/2015, switched from Lipitor, then pravastatin - last eye exam was in 12/2016 >> No DR. Dr. Prudencio Burly. + glaucoma. He had L cataract sx. In 01/2017. - He does have numbness in his feet. He is using CBD oil >> helped with the pain tremendously.  He has  PrCA >> started Finasteride to first decrease the Pr size >> will then have Rx seeds.   ROS: Constitutional: no weight gain/no weight loss, no fatigue, no subjective hyperthermia, no subjective hypothermia Eyes: no blurry vision, no xerophthalmia ENT: no sore throat, no nodules palpated in throat, no dysphagia, no odynophagia, no hoarseness Cardiovascular: no CP/no SOB/no palpitations/no leg swelling Respiratory: no cough/no SOB/no wheezing Gastrointestinal: no N/no V/no D/no C/no acid reflux Musculoskeletal: no muscle aches/no joint aches Skin: no  rashes, no hair loss Neurological: no tremors/+ numbness in feet/no tingling/no dizziness  I reviewed pt's medications, allergies, PMH, social hx, family hx, and changes were documented in the history of present illness. Otherwise, unchanged from my initial visit note.  Past Medical History:  Diagnosis Date  . CAD (coronary artery disease)   . Cataract   . CHICKENPOX, HX OF 08/30/2008   Qualifier: Diagnosis of  By: [name], [name]    . Chronic kidney disease   . Diabetes mellitus   . Glaucoma   . History of urinary calculi 08/30/2008   Qualifier: Diagnosis of  By: [name], Jacelyn Pi   . Hyperlipidemia   . Hypertension   . Renal calculus or  stone   . Skin cancer of nose    ears and hands   Past Surgical History:  Procedure Laterality Date  . ANKLE SURGERY     2013/ wears boot cast  . BASAL CELL CARCINOMA EXCISION     hands  . LAMINECTOMY  1983/1988  . Left knee replacement  2002/ 2004  . LITHOTRIPSY     was cancelled/pt passed stone  . MOHS SURGERY     nose  . TONSILLECTOMY     History   Social History  . Marital Status: Married    Spouse Name: N/A    Number of Children: 2   Occupational History  .      Retired   Social History Main Topics  . Smoking status: Never Smoker   . Smokeless tobacco: Never Used  . Alcohol Use: No  . Drug Use: No   Current Outpatient Prescriptions on File Prior to Visit  Medication Sig Dispense Refill  . aspirin EC 325 MG tablet Take 1 tablet (325 mg total) by mouth daily. 30 tablet 0  . bimatoprost (LUMIGAN) 0.03 % ophthalmic solution Place 1 drop into both eyes at bedtime.    . finasteride (PROSCAR) 5 MG tablet     . glucose blood (ONE TOUCH ULTRA TEST) test strip Use to test blood sugar 4  times daily 400 each 1  . HYDROmorphone (DILAUDID) 4 MG tablet Take 4 mg by mouth every 4 (four) hours as needed for severe pain. Reported on 01/22/2016    . insulin aspart (NOVOLOG FLEXPEN) 100 UNIT/ML FlexPen Inject 7-9 Units into the skin 3 (three) times daily with meals. 10 pen 3  . Insulin Detemir (LEVEMIR FLEXTOUCH) 100 UNIT/ML Pen Inject 35 Units into the skin daily at 10 pm. 10 pen 3  . Insulin Pen Needle (CAREFINE PEN NEEDLES) 32G X 4 MM MISC Use 3-4x a day 400 each 5  . INVOKANA 100 MG TABS tablet Take 1 tab once a day in am 90 tablet 3  . lisinopril (PRINIVIL,ZESTRIL) 20 MG tablet Take 1 tablet (20 mg total) by mouth daily. 90 tablet 0  . LUMIGAN 0.01 % SOLN     . metFORMIN (GLUCOPHAGE-XR) 500 MG 24 hr tablet Take 2 tablets (1,000 mg total) by mouth 2 (two) times daily. 360 tablet 5  . naproxen (NAPROSYN) 500 MG tablet TAKE 1 TABLET BY MOUTH TWICE DAILY WITH MEALS 180 tablet 0  .  NON FORMULARY Take 2 tablets by mouth daily. Diabetic vitamin packet 1 daily    . NON FORMULARY Alpha-lopic acid 200 mg-Take one daily    . Nutritional Supplements (PROSTATE ENZYME FORMULA PO) Take by mouth. Take 2 in pm    . Pitavastatin Calcium 4 MG TABS Take 1 tablet (4 mg total) by  mouth daily. 90 tablet 3  . tadalafil (CIALIS) 20 MG tablet Take 1 tablet by mouth  daily as needed for   erectile dysfunction. (Patient not taking: Reported on 01/05/2017) 6 tablet 5  . tamsulosin (FLOMAX) 0.4 MG CAPS capsule Take 0.4 mg by mouth daily after supper.      No current facility-administered medications on file prior to visit.    Allergies  Allergen Reactions  . Demerol Other (See Comments)    Blood pressure dropped completely out  . Meperidine Hcl Other (See Comments)    Drops blood pressure out  . Nitrospan [Nitroglycerin] Other (See Comments)    Blood pressure dropped out  . Sulfa Antibiotics Other (See Comments)    Urinary burning; blisters    Family History  Problem Relation Age of Onset  . Heart disease Brother        Atrial fibrillation  . Heart disease Father        CHF  . Heart disease Mother   . Heart disease Brother        atrial fib  . Cancer Sister        bone   PE: BP 124/76   Temp (!) 97 F (36.1 C) (Oral)   Wt 219 lb 9.6 oz (99.6 kg)   BMI 26.73 kg/m  Body mass index is 26.73 kg/m. Wt Readings from Last 3 Encounters:  04/09/17 219 lb 9.6 oz (99.6 kg)  01/05/17 216 lb (98 kg)  12/16/16 210 lb (95.3 kg)   Constitutional: overweight, in NAD Eyes: PERRLA, EOMI, no exophthalmos ENT: moist mucous membranes, no thyromegaly, no cervical lymphadenopathy Cardiovascular: RRR, No MRG Respiratory: CTA B Gastrointestinal: abdomen soft, NT, ND, BS+ Musculoskeletal: no deformities, strength intact in all 4 Skin: moist, warm, no rashes Neurological: no tremor with outstretched hands, DTR normal in all 4 (except L patellar - TKR)  ASSESSMENT: 1. DM2, insulin-dependent,  uncontrolled, without long term complications,But with hyperglycemia  2. HL  PLAN:  1. Patient with long standing fairly well controlled DM, on po meds + basal-bolus insulin regimen. His sugars were higher over the summer as he was on steroid eye drops, and they are still higher in am. He also had 2 low CBGs in the 46s' after increased activity. He changed his dose of Levemir (to 35 units) if his more active. OTW, stays with 40 units. Will continue this. - he also increased his Novolog with meals >> will keep the higher doses and I advised him that he can increase the dose to 10-11 with a smaller meal if sugars are higher later in the day. He had some 200s >> some of them he feels are due to having something sweet on his hands before checking sugars. - as he is planning to go back to the gym, will continue current regimen - I advised him to:  Patient Instructions  Please continue: - Metformin XR 1000 mg 2x a day - Invokana 100 mg daily in am before b'fast  - Levemir 35-40 units at bedtime - Novolog: - 9 units before a smaller meal - 12 units before a larger meal or if you have dessert  Please return in 3-4 months with your sugar log.   - today, HbA1c is 7.3% (higher) - continue checking sugars at different times of the day - check 3-4x a day, rotating checks  - he does a great job with this - advised for yearly eye exams >> he is UTD - Return to clinic in 3-4  mo with sugar log   2. HL - improved LDL on Livalo >> reviewed latest lipid panel - restarting exercise will also help  Philemon Kingdom, MD PhD [facility] Endocrinology

## 2017-04-16 DIAGNOSIS — C61 Malignant neoplasm of prostate: Secondary | ICD-10-CM | POA: Diagnosis not present

## 2017-04-17 ENCOUNTER — Other Ambulatory Visit: Payer: Self-pay | Admitting: Family Medicine

## 2017-04-17 ENCOUNTER — Telehealth: Payer: Self-pay | Admitting: Family Medicine

## 2017-04-21 DIAGNOSIS — N400 Enlarged prostate without lower urinary tract symptoms: Secondary | ICD-10-CM | POA: Diagnosis not present

## 2017-04-21 DIAGNOSIS — N5201 Erectile dysfunction due to arterial insufficiency: Secondary | ICD-10-CM | POA: Diagnosis not present

## 2017-04-21 DIAGNOSIS — C61 Malignant neoplasm of prostate: Secondary | ICD-10-CM | POA: Diagnosis not present

## 2017-04-21 MED ORDER — NAPROXEN 500 MG PO TABS: 500.0000 mg | ORAL_TABLET | Freq: Two times a day (BID) | ORAL | 2 refills | Status: DC

## 2017-04-21 NOTE — Telephone Encounter (Signed)
Patient aware that Naproxen has been sent to pharmacy

## 2017-05-19 ENCOUNTER — Ambulatory Visit: Payer: PPO | Admitting: Orthotics

## 2017-05-19 DIAGNOSIS — M79671 Pain in right foot: Secondary | ICD-10-CM

## 2017-05-19 DIAGNOSIS — M216X1 Other acquired deformities of right foot: Secondary | ICD-10-CM

## 2017-05-19 DIAGNOSIS — Z981 Arthrodesis status: Secondary | ICD-10-CM

## 2017-05-19 DIAGNOSIS — M216X2 Other acquired deformities of left foot: Secondary | ICD-10-CM

## 2017-05-19 DIAGNOSIS — M79672 Pain in left foot: Principal | ICD-10-CM

## 2017-05-19 NOTE — Progress Notes (Signed)
Patient needed a little more RF valgus stability.  Added 1/8" lateral post to [place].  Was happy.

## 2017-05-21 DIAGNOSIS — N522 Drug-induced erectile dysfunction: Secondary | ICD-10-CM | POA: Diagnosis not present

## 2017-05-21 DIAGNOSIS — C61 Malignant neoplasm of prostate: Secondary | ICD-10-CM | POA: Diagnosis not present

## 2017-05-22 ENCOUNTER — Telehealth: Payer: Self-pay | Admitting: Cardiology

## 2017-05-22 MED ORDER — PITAVASTATIN CALCIUM 4 MG PO TABS: 4.0000 mg | ORAL_TABLET | Freq: Every day | ORAL | 1 refills | Status: DC

## 2017-05-22 NOTE — Telephone Encounter (Signed)
New message    *STAT* If patient is at the pharmacy, call can be transferred to refill team.   1. Which medications need to be refilled? (please list name of each medication and dose if known) livalo   2. Which pharmacy/location (including street and city if local pharmacy) is medication to be sent to? [facility] on [address] in [place]  3. Do they need a 30 day or 90 day supply? 90 day

## 2017-05-22 NOTE — Telephone Encounter (Signed)
rx sent to pharmacy electronically.

## 2017-06-03 ENCOUNTER — Other Ambulatory Visit: Payer: Self-pay | Admitting: Cardiology

## 2017-06-03 DIAGNOSIS — I2510 Atherosclerotic heart disease of native coronary artery without angina pectoris: Secondary | ICD-10-CM

## 2017-06-03 NOTE — Telephone Encounter (Signed)
Rx(s) sent to pharmacy electronically.  

## 2017-06-08 ENCOUNTER — Encounter: Payer: Self-pay | Admitting: Internal Medicine

## 2017-06-11 DIAGNOSIS — H401221 Low-tension glaucoma, left eye, mild stage: Secondary | ICD-10-CM | POA: Diagnosis not present

## 2017-07-15 ENCOUNTER — Other Ambulatory Visit: Payer: Self-pay | Admitting: Cardiology

## 2017-07-15 NOTE — Telephone Encounter (Signed)
°*  STAT* If patient is at the pharmacy, call can be transferred to refill team.   1. Which medications need to be refilled? (please list name of each medication and dose if known) Livalo 4mg   2. Which pharmacy/location (including street and city if local pharmacy) is medication to be sent to?[facility] [address]   3. Do they need a 30 day or 90 day supply?[place]

## 2017-07-16 ENCOUNTER — Other Ambulatory Visit: Payer: Self-pay | Admitting: Cardiology

## 2017-08-10 ENCOUNTER — Telehealth: Payer: Self-pay | Admitting: Internal Medicine

## 2017-08-10 ENCOUNTER — Encounter: Payer: Self-pay | Admitting: Internal Medicine

## 2017-08-10 ENCOUNTER — Ambulatory Visit: Payer: PPO | Admitting: Internal Medicine

## 2017-08-10 VITALS — BP 140/80 | HR 83 | Ht 76.0 in | Wt 220.8 lb

## 2017-08-10 DIAGNOSIS — E1165 Type 2 diabetes mellitus with hyperglycemia: Secondary | ICD-10-CM | POA: Diagnosis not present

## 2017-08-10 DIAGNOSIS — E785 Hyperlipidemia, unspecified: Secondary | ICD-10-CM | POA: Diagnosis not present

## 2017-08-10 DIAGNOSIS — Z794 Long term (current) use of insulin: Secondary | ICD-10-CM

## 2017-08-10 DIAGNOSIS — E786 Lipoprotein deficiency: Secondary | ICD-10-CM

## 2017-08-10 DIAGNOSIS — IMO0001 Reserved for inherently not codable concepts without codable children: Secondary | ICD-10-CM

## 2017-08-10 LAB — POCT GLYCOSYLATED HEMOGLOBIN (HGB A1C): Hemoglobin A1C: 7.5

## 2017-08-10 MED ORDER — METFORMIN HCL ER 500 MG PO TB24: 1000.0000 mg | ORAL_TABLET | Freq: Every day | ORAL | 3 refills | Status: DC

## 2017-08-10 NOTE — Telephone Encounter (Signed)
Please advise on below  

## 2017-08-10 NOTE — Patient Instructions (Addendum)
Please continue: - Levemir 35-40 units at bedtime - Novolog: - 9 units before a smaller meal - 12 units before a larger meal or if you have dessert  Please restart: - Metformin XR 1000 mg at dinnertime  Please watch: "For[employer] over [employer]"  Please look up: [employer] - The Engine 2 diet   Rip Esselstyn - The Engine 2 7-day Rescue diet  Please return in 4 months with your sugar log.

## 2017-08-10 NOTE — Addendum Note (Signed)
Addended by: Drucilla Schmidt on: 08/10/2017 11:53 AM   Modules accepted: Orders

## 2017-08-10 NOTE — Telephone Encounter (Signed)
Sent him a message.

## 2017-08-10 NOTE — Progress Notes (Signed)
Patient ID: Nathaniel Thomas, male   DOB: [birth year], 67 y.o.   MRN: [id number]  HPI: Nathaniel Thomas is a 67 y.o.-year-old male, returning for f/u for DM2, dx 1999, insulin-dependent since 2008, uncontrolled, without long-term complications. Last visit 4 mo ago.   He is on a keto diet + does intermittent fasting 6 pm-9 am. Sugars were initially better, now higher.  Last hemoglobin A1c was: Lab Results  Component Value Date   HGBA1C 7.3 04/09/2017   HGBA1C 7.1 01/05/2017   HGBA1C 6.9 08/01/2016  He had a steroid inj and Prednisone taper in 07/2014.  Pt is on a regimen of:  Stopped since last visitStopped since last visit - Levemir 35-40 units at bedtime - Novolog: - 9 units before a smaller meal - 12 units before a larger meal or if you have dessert  Pt checks his sugars 3-4x a day -reviewed his log: - am: 86-158, 172 >> 86, 106-177, 182, 195 >> 76, 120-187, 200 - 2h after b'fast:91-157, 222 >> 72-172 >> 75, 123-151 >> 93-160, 264 - before lunch: 87-154, 177 >> 56, 69-156, 181 >> 102-200, 240 - 2h after lunch:  70, 116-160 >> 84-190 >> 102-193 >> 97-173 - before dinner:96-165 >> 69, 80-150, 178 >> 62-190, 283 >> 63, 100-160, 229 - 2h after dinner: 150-209 >> 178-218 >> 79-136, 210 >> 129-189,244, 261 - nighttime: 96-198, 220 >> 78-183, 62 x1 >> 55 x1 >> 194 Lowest sugar was 55 at night, 56 before lunch >> 55 x1;  he has hypoglycemia awareness in the 60s.  Highest sugar was 283 >> 264.  Pt's meals are: - Breakfast: [place] bacon, 1-2 eggs, butter; protein shake 3x a week; toast - Lunch: salad - large; rotisserie chicken or cheese - Dinner: chicken chilli, brown rice  - Snacks: fruits, fruit cake; nuts; black bean chips  -No CKD, last BUN/creatinine:  Lab Results  Component Value Date   BUN 17 09/03/2016   CREATININE 0.98 09/03/2016  On lisinopril. -+ HL; last set of lipids: Lab Results  Component Value Date   CHOL 170 09/03/2016   HDL 37 (L) 09/03/2016   LDLCALC 86  09/03/2016   LDLDIRECT 113.0 02/23/2015   TRIG 233 (H) 09/03/2016   CHOLHDL 4.6 09/03/2016  On Livalo since 08/2015, switched from Lipitor, then pravastatin - last eye exam was in 12/2016: No DR. Dr. Prudencio Burly. + glaucoma. He had L cataract sx. in 01/2017 -+ Numbness in his feet. He is using CBD oil which helped with the neuropathy tremendously.  He has  PrCA >> started Finasteride to first decrease the Pr size >> will then have Rx seeds.   ROS: Constitutional: no weight gain/no weight loss, no fatigue, no subjective hyperthermia, no subjective hypothermia, + nocturia Eyes: no blurry vision, no xerophthalmia ENT: no sore throat, no nodules palpated in throat, no dysphagia, no odynophagia, no hoarseness Cardiovascular: no CP/no SOB/no palpitations/no leg swelling Respiratory: + cough/no SOB/no wheezing Gastrointestinal: no N/no V/no D/no C/no acid reflux Musculoskeletal: no muscle aches/no joint aches Skin: no rashes, no hair loss Neurological: no tremors/no numbness/no tingling/no dizziness  I reviewed pt's medications, allergies, PMH, social hx, family hx, and changes were documented in the history of present illness. Otherwise, unchanged from my initial visit note.  Past Medical History:  Diagnosis Date  . CAD (coronary artery disease)   . Cataract   . CHICKENPOX, HX OF 08/30/2008   Qualifier: Diagnosis of  By: [name], [name]    . Chronic kidney disease   .  Diabetes mellitus   . Glaucoma   . History of urinary calculi 08/30/2008   Qualifier: Diagnosis of  By: [name], Jacelyn Pi   . Hyperlipidemia   . Hypertension   . Renal calculus or stone   . Skin cancer of nose    ears and hands   Past Surgical History:  Procedure Laterality Date  . ANKLE SURGERY     2013/ wears boot cast  . BASAL CELL CARCINOMA EXCISION     hands  . LAMINECTOMY  1983/1988  . Left knee replacement  2002/ 2004  . LITHOTRIPSY     was cancelled/pt passed stone  . MOHS SURGERY     nose  . TONSILLECTOMY      History   Social History  . Marital Status: Married    Spouse Name: N/A    Number of Children: 2   Occupational History  .      Retired   Social History Main Topics  . Smoking status: Never Smoker   . Smokeless tobacco: Never Used  . Alcohol Use: No  . Drug Use: No   Current Outpatient Medications on File Prior to Visit  Medication Sig Dispense Refill  . aspirin EC 325 MG tablet Take 1 tablet (325 mg total) by mouth daily. 30 tablet 0  . bimatoprost (LUMIGAN) 0.03 % ophthalmic solution Place 1 drop into both eyes at bedtime.    . finasteride (PROSCAR) 5 MG tablet     . glucose blood (ONE TOUCH ULTRA TEST) test strip Use to test blood sugar 4  times daily 400 each 1  . HYDROmorphone (DILAUDID) 4 MG tablet Take 4 mg by mouth every 4 (four) hours as needed for severe pain. Reported on 01/22/2016    . insulin aspart (NOVOLOG FLEXPEN) 100 UNIT/ML FlexPen Inject 9-12 Units into the skin 3 (three) times daily with meals. 10 pen 5  . Insulin Detemir (LEVEMIR FLEXTOUCH) 100 UNIT/ML Pen Inject 40 Units into the skin daily at 10 pm. 10 pen 5  . Insulin Pen Needle (CAREFINE PEN NEEDLES) 32G X 4 MM MISC Use 3-4x a day 400 each 5  . INVOKANA 100 MG TABS tablet Take 1 tab once a day in am 90 tablet 3  . lisinopril (PRINIVIL,ZESTRIL) 20 MG tablet Take 1 tablet (20 mg total) by mouth daily. 90 tablet 0  . LIVALO 4 MG TABS TAKE 1 TABLET BY MOUTH ONCE DAILY (PLEASE  SCHEDULE  APPOINTMENT) 30 tablet 2  . LUMIGAN 0.01 % SOLN     . metFORMIN (GLUCOPHAGE-XR) 500 MG 24 hr tablet Take 2 tablets (1,000 mg total) by mouth 2 (two) times daily. 360 tablet 5  . naproxen (NAPROSYN) 500 MG tablet Take 1 tablet (500 mg total) by mouth 2 (two) times daily with a meal. 180 tablet 2  . NON FORMULARY Take 2 tablets by mouth daily. Diabetic vitamin packet 1 daily    . NON FORMULARY Alpha-lopic acid 200 mg-Take one daily    . Nutritional Supplements (PROSTATE ENZYME FORMULA PO) Take by mouth. Take 2 in pm    .  tadalafil (CIALIS) 20 MG tablet Take 1 tablet by mouth  daily as needed for   erectile dysfunction. (Patient not taking: Reported on 01/05/2017) 6 tablet 5  . tamsulosin (FLOMAX) 0.4 MG CAPS capsule Take 0.4 mg by mouth daily after supper.      No current facility-administered medications on file prior to visit.    Allergies  Allergen Reactions  . Demerol Other (  See Comments)    Blood pressure dropped completely out  . Meperidine Hcl Other (See Comments)    Drops blood pressure out  . Nitrospan [Nitroglycerin] Other (See Comments)    Blood pressure dropped out  . Sulfa Antibiotics Other (See Comments)    Urinary burning; blisters    Family History  Problem Relation Age of Onset  . Heart disease Brother        Atrial fibrillation  . Heart disease Father        CHF  . Heart disease Mother   . Heart disease Brother        atrial fib  . Cancer Sister        bone   PE: BP 140/80   Pulse 83   Ht 6\' 4"  (1.93 m)   Wt 220 lb 12.8 oz (100.2 kg)   SpO2 97%   BMI 26.88 kg/m  Body mass index is 26.88 kg/m. Wt Readings from Last 3 Encounters:  08/10/17 220 lb 12.8 oz (100.2 kg)  04/09/17 219 lb 9.6 oz (99.6 kg)  01/05/17 216 lb (98 kg)   Constitutional: overweight, in NAD Eyes: PERRLA, EOMI, no exophthalmos ENT: moist mucous membranes, no thyromegaly, no cervical lymphadenopathy Cardiovascular: RRR, No MRG Respiratory: CTA B Gastrointestinal: abdomen soft, NT, ND, BS+ Musculoskeletal: no deformities, strength intact in all 4 Skin: moist, warm, no rashes Neurological: no tremor with outstretched hands, DTR normal in all 4 (except L patellar - TKR)  ASSESSMENT: 1. DM2, insulin-dependent, uncontrolled, without long term complications,But with hyperglycemia  2. HL  PLAN:  1. Patient with long-standing, fairly well-controlled diabetes, on oral medicines and basal-bolus insulin regimen.  His sugars are higher over last summer as he was on steroid eyedrops and at last visit they  were still slightly higher in a.m.  However, he also had 2 low CBGs in the 50s after increased activity.  We discussed about using flexible doses of NovoLog with his meals.  He was also planning to return to the gym so we did not make any other changes in his regimen at that time. - at this visit, sugars are a little higher on the keto diet. He is also fasting from 6 pm to 9 am. - he stopped both Metformin and Invokana since last visit >> will restart Metformin ER 1000 mg at dinnertime - discussed about switching to a whole food plant-based diet >> given references - will not change his regimen for now - I advised him to: Patient Instructions  Please continue: - Levemir 35-40 units at bedtime - Novolog: - 9 units before a smaller meal - 12 units before a larger meal or if you have dessert  Please restart: - Metformin XR 1000 mg at dinnertime  Please watch: "Forks over [employer]"  Please look up: [employer] - The Engine 2 diet   [employer] - The Engine 2 7-day Rescue diet  Please return in 4 months with your sugar log.   - today, HbA1c is 7.5% (higher) - continue checking sugars at different times of the day - check 3x a day, rotating checks - advised for yearly eye exams >> he is UTD - Return to clinic in 4 mo with sugar log   2. HL - Reviewed lipid panel from 08/2016: LDL at goal, improved, triglycerides high - He continues on Livalo without side effects  - a plant-based diet will help more than the keto diet  Philemon Kingdom, MD PhD [facility] Endocrinology

## 2017-08-10 NOTE — Telephone Encounter (Signed)
Patient is asking if you can tell him about the diet you were talking about, so he can look it up on line. Please advise

## 2017-08-11 DIAGNOSIS — I10 Essential (primary) hypertension: Secondary | ICD-10-CM | POA: Diagnosis not present

## 2017-08-11 DIAGNOSIS — R351 Nocturia: Secondary | ICD-10-CM | POA: Diagnosis not present

## 2017-08-11 DIAGNOSIS — E119 Type 2 diabetes mellitus without complications: Secondary | ICD-10-CM | POA: Diagnosis not present

## 2017-08-11 DIAGNOSIS — Z794 Long term (current) use of insulin: Secondary | ICD-10-CM | POA: Diagnosis not present

## 2017-08-11 DIAGNOSIS — N401 Enlarged prostate with lower urinary tract symptoms: Secondary | ICD-10-CM | POA: Diagnosis not present

## 2017-08-11 DIAGNOSIS — E7800 Pure hypercholesterolemia, unspecified: Secondary | ICD-10-CM | POA: Diagnosis not present

## 2017-08-12 DIAGNOSIS — Z794 Long term (current) use of insulin: Secondary | ICD-10-CM | POA: Diagnosis not present

## 2017-08-12 DIAGNOSIS — I10 Essential (primary) hypertension: Secondary | ICD-10-CM | POA: Diagnosis not present

## 2017-08-12 DIAGNOSIS — E7800 Pure hypercholesterolemia, unspecified: Secondary | ICD-10-CM | POA: Diagnosis not present

## 2017-08-12 DIAGNOSIS — E119 Type 2 diabetes mellitus without complications: Secondary | ICD-10-CM | POA: Diagnosis not present

## 2017-08-19 NOTE — Progress Notes (Signed)
HPI: FU CAD. Cardiac CT in July of 2012 revealed mild to moderate coronary artery disease. The patient's total coronary artery calcium score was 28.9, which is 42nd percentile for patient's matched age and gender. The calcium is all part of a mixed plaque in the proximal LAD which causes less than 50% stenosis. Nuclear study in December 2014 showed an ejection fraction of 52% and normal perfusion. Since last seen, patient denies dyspnea, exertional chest pain, palpitations or syncope.  Current Outpatient Medications  Medication Sig Dispense Refill  . aspirin EC 325 MG tablet Take 1 tablet (325 mg total) by mouth daily. 30 tablet 0  . finasteride (PROSCAR) 5 MG tablet     . glucose blood (ONE TOUCH ULTRA TEST) test strip Use to test blood sugar 4  times daily 400 each 1  . insulin aspart (NOVOLOG FLEXPEN) 100 UNIT/ML FlexPen Inject 9-12 Units into the skin 3 (three) times daily with meals. 10 pen 5  . Insulin Detemir (LEVEMIR FLEXTOUCH) 100 UNIT/ML Pen Inject 40 Units into the skin daily at 10 pm. 10 pen 5  . Insulin Pen Needle (CAREFINE PEN NEEDLES) 32G X 4 MM MISC Use 3-4x a day 400 each 5  . lisinopril (PRINIVIL,ZESTRIL) 20 MG tablet Take 1 tablet (20 mg total) by mouth daily. 90 tablet 0  . LIVALO 4 MG TABS TAKE 1 TABLET BY MOUTH ONCE DAILY (PLEASE  SCHEDULE  APPOINTMENT) 30 tablet 2  . LUMIGAN 0.01 % SOLN     . metFORMIN (GLUCOPHAGE-XR) 500 MG 24 hr tablet Take 2 tablets (1,000 mg total) by mouth daily with supper. 180 tablet 3  . naproxen (NAPROSYN) 500 MG tablet Take 1 tablet (500 mg total) by mouth 2 (two) times daily with a meal. 180 tablet 2  . NON FORMULARY Take 2 tablets by mouth daily. Diabetic vitamin packet 1 daily    . NON FORMULARY Alpha-lopic acid 200 mg-Take one daily    . Nutritional Supplements (PROSTATE ENZYME FORMULA PO) Take by mouth. Take 2 in pm    . tadalafil (CIALIS) 20 MG tablet Take 1 tablet by mouth  daily as needed for   erectile dysfunction. 6 tablet 5  .  tamsulosin (FLOMAX) 0.4 MG CAPS capsule Take 0.4 mg by mouth daily after supper.      No current facility-administered medications for this visit.      Past Medical History:  Diagnosis Date  . CAD (coronary artery disease)   . Cataract   . CHICKENPOX, HX OF 08/30/2008   Qualifier: Diagnosis of  By: [name], [name]    . Chronic kidney disease   . Diabetes mellitus   . Glaucoma   . History of urinary calculi 08/30/2008   Qualifier: Diagnosis of  By: [name], Jacelyn Pi   . Hyperlipidemia   . Hypertension   . Renal calculus or stone   . Skin cancer of nose    ears and hands    Past Surgical History:  Procedure Laterality Date  . ANKLE SURGERY     2013/ wears boot cast  . BASAL CELL CARCINOMA EXCISION     hands  . LAMINECTOMY  1983/1988  . Left knee replacement  2002/ 2004  . LITHOTRIPSY     was cancelled/pt passed stone  . MOHS SURGERY     nose  . TONSILLECTOMY      Social History   Socioeconomic History  . Marital status: Married    Spouse name: Not on file  .  Number of children: 2  . Years of education: Not on file  . Highest education level: Not on file  Social Needs  . Financial resource strain: Not on file  . Food insecurity - worry: Not on file  . Food insecurity - inability: Not on file  . Transportation needs - medical: Not on file  . Transportation needs - non-medical: Not on file  Occupational History    Comment: Retired  Tobacco Use  . Smoking status: Never Smoker  . Smokeless tobacco: Former Network engineer and Sexual Activity  . Alcohol use: No  . Drug use: No  . Sexual activity: Yes    Comment: 40 year of marriage  Other Topics Concern  . Not on file  Social History Narrative  . Not on file    Family History  Problem Relation Age of Onset  . Heart disease Brother        Atrial fibrillation  . Heart disease Father        CHF  . Heart disease Mother   . Heart disease Brother        atrial fib  . Cancer Sister        bone    ROS:  no fevers or chills, productive cough, hemoptysis, dysphasia, odynophagia, melena, hematochezia, dysuria, hematuria, rash, seizure activity, orthopnea, PND, pedal edema, claudication. Remaining systems are negative.  Physical Exam: Well-developed well-nourished in no acute distress.  Skin is warm and dry.  HEENT is normal.  Neck is supple.  Chest is clear to auscultation with normal expansion.  Cardiovascular exam is regular rate and rhythm.  Abdominal exam nontender or distended. No masses palpated. Extremities show no edema. neuro grossly intact  ECG- sinus rhythm at a rate of 77.  Left ventricular hypertrophy.  Occasional PVC. personally reviewed  A/P  1 coronary artery disease-patient denies chest pain or dyspnea.  Plan to continue medical therapy with aspirin and statin.    2 hypertension-Blood pressure is elevated today.  He just recently increased his lisinopril to 40 mg daily.  I have asked him to track this and we will advance regimen as needed.  Goal systolic blood pressure less than 681 and diastolic blood pressure less than 85.  3 hyperlipidemia-most recent lipids not at goal.  He did not tolerate Crestor or Lipitor in the past.  He is only taking Livalo twice weekly.  Once he completes his present supply we will change to pravastatin 80 mg daily.  Check lipids and liver 4 weeks later.    Kirk Ruths, MD

## 2017-08-20 NOTE — Telephone Encounter (Signed)
requesting for test scripts, send to [facility] in [place] on [address]

## 2017-08-24 NOTE — Telephone Encounter (Signed)
Pharmacy hasnt received script for test strips And needs [place] to sent the script back over to them     [place], [place] - [place]

## 2017-08-25 ENCOUNTER — Other Ambulatory Visit: Payer: Self-pay | Admitting: Internal Medicine

## 2017-08-25 NOTE — Telephone Encounter (Signed)
Pt says pharmacy has not received script for test strips.  Can we resend to [place], [place]

## 2017-08-26 ENCOUNTER — Ambulatory Visit (INDEPENDENT_AMBULATORY_CARE_PROVIDER_SITE_OTHER): Payer: PPO | Admitting: Cardiology

## 2017-08-26 ENCOUNTER — Encounter: Payer: Self-pay | Admitting: Cardiology

## 2017-08-26 VITALS — BP 155/78 | HR 70 | Ht 76.0 in | Wt 221.0 lb

## 2017-08-26 DIAGNOSIS — E7800 Pure hypercholesterolemia, unspecified: Secondary | ICD-10-CM

## 2017-08-26 DIAGNOSIS — I2510 Atherosclerotic heart disease of native coronary artery without angina pectoris: Secondary | ICD-10-CM

## 2017-08-26 DIAGNOSIS — I10 Essential (primary) hypertension: Secondary | ICD-10-CM

## 2017-08-26 MED ORDER — PRAVASTATIN SODIUM 80 MG PO TABS: 80.0000 mg | ORAL_TABLET | Freq: Every evening | ORAL | 3 refills | Status: DC

## 2017-08-26 NOTE — Patient Instructions (Signed)
Medication Instructions:   STOP LIVALO WHEN CURRENT SUPPLY COMPLETE  START PRAVASTATIN 80 MG ONCE DAILY TO REPLACE LIVALO  Labwork:  Your physician recommends that you return for lab work in:4 [place]:  Your physician wants you to follow-up in: [place] will receive a reminder letter in the mail two months in advance. If you don't receive a letter, please call our office to schedule the follow-up appointment.   If you need a refill on your cardiac medications before your next appointment, please call your pharmacy.

## 2017-08-31 ENCOUNTER — Other Ambulatory Visit: Payer: Self-pay | Admitting: Cardiology

## 2017-08-31 DIAGNOSIS — I2510 Atherosclerotic heart disease of native coronary artery without angina pectoris: Secondary | ICD-10-CM

## 2017-08-31 NOTE — Telephone Encounter (Signed)
Rx request sent to pharmacy.  

## 2017-09-02 DIAGNOSIS — D2271 Melanocytic nevi of right lower limb, including hip: Secondary | ICD-10-CM | POA: Diagnosis not present

## 2017-09-02 DIAGNOSIS — L249 Irritant contact dermatitis, unspecified cause: Secondary | ICD-10-CM | POA: Diagnosis not present

## 2017-09-02 DIAGNOSIS — D224 Melanocytic nevi of scalp and neck: Secondary | ICD-10-CM | POA: Diagnosis not present

## 2017-09-02 DIAGNOSIS — L821 Other seborrheic keratosis: Secondary | ICD-10-CM | POA: Diagnosis not present

## 2017-09-02 DIAGNOSIS — Z23 Encounter for immunization: Secondary | ICD-10-CM | POA: Diagnosis not present

## 2017-09-02 DIAGNOSIS — L570 Actinic keratosis: Secondary | ICD-10-CM | POA: Diagnosis not present

## 2017-09-02 DIAGNOSIS — Z85828 Personal history of other malignant neoplasm of skin: Secondary | ICD-10-CM | POA: Diagnosis not present

## 2017-10-02 DIAGNOSIS — M19079 Primary osteoarthritis, unspecified ankle and foot: Secondary | ICD-10-CM | POA: Diagnosis not present

## 2017-10-02 DIAGNOSIS — M216X9 Other acquired deformities of unspecified foot: Secondary | ICD-10-CM | POA: Diagnosis not present

## 2017-10-02 DIAGNOSIS — M25572 Pain in left ankle and joints of left foot: Secondary | ICD-10-CM | POA: Diagnosis not present

## 2017-10-02 DIAGNOSIS — M19072 Primary osteoarthritis, left ankle and foot: Secondary | ICD-10-CM | POA: Diagnosis not present

## 2017-10-02 DIAGNOSIS — M216X2 Other acquired deformities of left foot: Secondary | ICD-10-CM | POA: Diagnosis not present

## 2017-10-06 ENCOUNTER — Encounter: Payer: Self-pay | Admitting: *Deleted

## 2017-10-06 ENCOUNTER — Encounter: Payer: Self-pay | Admitting: Cardiology

## 2017-10-07 ENCOUNTER — Other Ambulatory Visit: Payer: Self-pay

## 2017-10-07 MED ORDER — INSULIN PEN NEEDLE 32G X 5 MM MISC: 11 refills | Status: DC

## 2017-10-08 ENCOUNTER — Other Ambulatory Visit: Payer: Self-pay

## 2017-10-08 NOTE — Telephone Encounter (Signed)
Spoke to patient. Advised receiving fax Rx from [facility] for pen needles. Showing refilled pen needles through [facility] on 10/07/17. Patient says he has pen needles and to disregard the [facility] Rx fax request.

## 2017-10-09 DIAGNOSIS — I10 Essential (primary) hypertension: Secondary | ICD-10-CM | POA: Diagnosis not present

## 2017-10-09 DIAGNOSIS — E785 Hyperlipidemia, unspecified: Secondary | ICD-10-CM | POA: Diagnosis not present

## 2017-10-09 DIAGNOSIS — Z794 Long term (current) use of insulin: Secondary | ICD-10-CM | POA: Diagnosis not present

## 2017-10-09 DIAGNOSIS — E1159 Type 2 diabetes mellitus with other circulatory complications: Secondary | ICD-10-CM | POA: Diagnosis not present

## 2017-10-09 DIAGNOSIS — E119 Type 2 diabetes mellitus without complications: Secondary | ICD-10-CM | POA: Diagnosis not present

## 2017-10-09 DIAGNOSIS — E1169 Type 2 diabetes mellitus with other specified complication: Secondary | ICD-10-CM | POA: Diagnosis not present

## 2017-10-12 ENCOUNTER — Telehealth: Payer: Self-pay | Admitting: Internal Medicine

## 2017-10-12 NOTE — Telephone Encounter (Signed)
[facility] Called about patients Pen needles. They are faxing over a paper for this. And would like a call back they have some questions.      [id number] Press 0, option 3 then option 1 Ref Key- [id number]

## 2017-10-14 NOTE — Telephone Encounter (Signed)
Received fax and responded.

## 2017-10-20 ENCOUNTER — Other Ambulatory Visit: Payer: Self-pay

## 2017-10-20 MED ORDER — INSULIN PEN NEEDLE 32G X 4 MM MISC: 2 refills | Status: DC

## 2017-10-27 DIAGNOSIS — R300 Dysuria: Secondary | ICD-10-CM | POA: Diagnosis not present

## 2017-10-27 DIAGNOSIS — C61 Malignant neoplasm of prostate: Secondary | ICD-10-CM | POA: Diagnosis not present

## 2017-11-26 ENCOUNTER — Other Ambulatory Visit: Payer: Self-pay | Admitting: Cardiology

## 2017-11-26 DIAGNOSIS — I2510 Atherosclerotic heart disease of native coronary artery without angina pectoris: Secondary | ICD-10-CM

## 2017-12-04 DIAGNOSIS — L259 Unspecified contact dermatitis, unspecified cause: Secondary | ICD-10-CM | POA: Diagnosis not present

## 2017-12-04 DIAGNOSIS — H401221 Low-tension glaucoma, left eye, mild stage: Secondary | ICD-10-CM | POA: Diagnosis not present

## 2017-12-04 DIAGNOSIS — E119 Type 2 diabetes mellitus without complications: Secondary | ICD-10-CM | POA: Diagnosis not present

## 2017-12-04 DIAGNOSIS — Z961 Presence of intraocular lens: Secondary | ICD-10-CM | POA: Diagnosis not present

## 2017-12-08 ENCOUNTER — Ambulatory Visit: Payer: PPO | Admitting: Internal Medicine

## 2017-12-15 DIAGNOSIS — E785 Hyperlipidemia, unspecified: Secondary | ICD-10-CM | POA: Diagnosis not present

## 2017-12-15 DIAGNOSIS — E1169 Type 2 diabetes mellitus with other specified complication: Secondary | ICD-10-CM | POA: Diagnosis not present

## 2017-12-15 DIAGNOSIS — E119 Type 2 diabetes mellitus without complications: Secondary | ICD-10-CM | POA: Diagnosis not present

## 2017-12-15 DIAGNOSIS — E1159 Type 2 diabetes mellitus with other circulatory complications: Secondary | ICD-10-CM | POA: Diagnosis not present

## 2017-12-15 DIAGNOSIS — Z794 Long term (current) use of insulin: Secondary | ICD-10-CM | POA: Diagnosis not present

## 2017-12-15 DIAGNOSIS — I10 Essential (primary) hypertension: Secondary | ICD-10-CM | POA: Diagnosis not present

## 2017-12-15 DIAGNOSIS — I2510 Atherosclerotic heart disease of native coronary artery without angina pectoris: Secondary | ICD-10-CM | POA: Diagnosis not present

## 2017-12-16 DIAGNOSIS — I10 Essential (primary) hypertension: Secondary | ICD-10-CM | POA: Diagnosis not present

## 2017-12-16 DIAGNOSIS — Z794 Long term (current) use of insulin: Secondary | ICD-10-CM | POA: Diagnosis not present

## 2017-12-16 DIAGNOSIS — E1169 Type 2 diabetes mellitus with other specified complication: Secondary | ICD-10-CM | POA: Diagnosis not present

## 2017-12-16 DIAGNOSIS — E785 Hyperlipidemia, unspecified: Secondary | ICD-10-CM | POA: Diagnosis not present

## 2017-12-16 DIAGNOSIS — E119 Type 2 diabetes mellitus without complications: Secondary | ICD-10-CM | POA: Diagnosis not present

## 2017-12-16 DIAGNOSIS — E1159 Type 2 diabetes mellitus with other circulatory complications: Secondary | ICD-10-CM | POA: Diagnosis not present

## 2018-01-05 DIAGNOSIS — L821 Other seborrheic keratosis: Secondary | ICD-10-CM | POA: Diagnosis not present

## 2018-01-05 DIAGNOSIS — D485 Neoplasm of uncertain behavior of skin: Secondary | ICD-10-CM | POA: Diagnosis not present

## 2018-01-05 DIAGNOSIS — L988 Other specified disorders of the skin and subcutaneous tissue: Secondary | ICD-10-CM | POA: Diagnosis not present

## 2018-01-05 DIAGNOSIS — L570 Actinic keratosis: Secondary | ICD-10-CM | POA: Diagnosis not present

## 2018-01-06 DIAGNOSIS — I2510 Atherosclerotic heart disease of native coronary artery without angina pectoris: Secondary | ICD-10-CM | POA: Diagnosis not present

## 2018-01-06 DIAGNOSIS — E1169 Type 2 diabetes mellitus with other specified complication: Secondary | ICD-10-CM | POA: Diagnosis not present

## 2018-01-06 DIAGNOSIS — Z794 Long term (current) use of insulin: Secondary | ICD-10-CM | POA: Diagnosis not present

## 2018-01-06 DIAGNOSIS — I10 Essential (primary) hypertension: Secondary | ICD-10-CM | POA: Diagnosis not present

## 2018-01-06 DIAGNOSIS — E785 Hyperlipidemia, unspecified: Secondary | ICD-10-CM | POA: Diagnosis not present

## 2018-01-06 DIAGNOSIS — E1159 Type 2 diabetes mellitus with other circulatory complications: Secondary | ICD-10-CM | POA: Diagnosis not present

## 2018-01-06 DIAGNOSIS — I208 Other forms of angina pectoris: Secondary | ICD-10-CM | POA: Diagnosis not present

## 2018-01-06 DIAGNOSIS — C61 Malignant neoplasm of prostate: Secondary | ICD-10-CM | POA: Diagnosis not present

## 2018-01-06 DIAGNOSIS — E119 Type 2 diabetes mellitus without complications: Secondary | ICD-10-CM | POA: Diagnosis not present

## 2018-01-20 DIAGNOSIS — I2510 Atherosclerotic heart disease of native coronary artery without angina pectoris: Secondary | ICD-10-CM | POA: Diagnosis not present

## 2018-01-20 DIAGNOSIS — I208 Other forms of angina pectoris: Secondary | ICD-10-CM | POA: Diagnosis not present

## 2018-01-22 DIAGNOSIS — E119 Type 2 diabetes mellitus without complications: Secondary | ICD-10-CM | POA: Diagnosis not present

## 2018-01-22 DIAGNOSIS — E1169 Type 2 diabetes mellitus with other specified complication: Secondary | ICD-10-CM | POA: Diagnosis not present

## 2018-01-22 DIAGNOSIS — I10 Essential (primary) hypertension: Secondary | ICD-10-CM | POA: Diagnosis not present

## 2018-01-22 DIAGNOSIS — E1159 Type 2 diabetes mellitus with other circulatory complications: Secondary | ICD-10-CM | POA: Diagnosis not present

## 2018-01-22 DIAGNOSIS — Z794 Long term (current) use of insulin: Secondary | ICD-10-CM | POA: Diagnosis not present

## 2018-01-22 DIAGNOSIS — E785 Hyperlipidemia, unspecified: Secondary | ICD-10-CM | POA: Diagnosis not present

## 2018-01-27 DIAGNOSIS — Z794 Long term (current) use of insulin: Secondary | ICD-10-CM | POA: Diagnosis not present

## 2018-01-27 DIAGNOSIS — E119 Type 2 diabetes mellitus without complications: Secondary | ICD-10-CM | POA: Diagnosis not present

## 2018-01-27 DIAGNOSIS — I208 Other forms of angina pectoris: Secondary | ICD-10-CM | POA: Diagnosis not present

## 2018-01-27 DIAGNOSIS — E785 Hyperlipidemia, unspecified: Secondary | ICD-10-CM | POA: Diagnosis not present

## 2018-01-27 DIAGNOSIS — E1169 Type 2 diabetes mellitus with other specified complication: Secondary | ICD-10-CM | POA: Diagnosis not present

## 2018-01-27 DIAGNOSIS — E1159 Type 2 diabetes mellitus with other circulatory complications: Secondary | ICD-10-CM | POA: Diagnosis not present

## 2018-01-27 DIAGNOSIS — I10 Essential (primary) hypertension: Secondary | ICD-10-CM | POA: Diagnosis not present

## 2018-01-27 DIAGNOSIS — I2510 Atherosclerotic heart disease of native coronary artery without angina pectoris: Secondary | ICD-10-CM | POA: Diagnosis not present

## 2018-02-18 DIAGNOSIS — N200 Calculus of kidney: Secondary | ICD-10-CM | POA: Diagnosis not present

## 2018-02-18 DIAGNOSIS — R1032 Left lower quadrant pain: Secondary | ICD-10-CM | POA: Diagnosis not present

## 2018-02-25 ENCOUNTER — Other Ambulatory Visit: Payer: Self-pay | Admitting: Internal Medicine

## 2018-02-25 NOTE — Telephone Encounter (Signed)
OK 

## 2018-02-25 NOTE — Telephone Encounter (Signed)
Is this okay to refill? 

## 2018-04-09 DIAGNOSIS — C61 Malignant neoplasm of prostate: Secondary | ICD-10-CM | POA: Diagnosis not present

## 2018-04-15 DIAGNOSIS — N400 Enlarged prostate without lower urinary tract symptoms: Secondary | ICD-10-CM | POA: Diagnosis not present

## 2018-04-15 DIAGNOSIS — C61 Malignant neoplasm of prostate: Secondary | ICD-10-CM | POA: Diagnosis not present

## 2018-05-04 DIAGNOSIS — E785 Hyperlipidemia, unspecified: Secondary | ICD-10-CM | POA: Diagnosis not present

## 2018-05-04 DIAGNOSIS — E1169 Type 2 diabetes mellitus with other specified complication: Secondary | ICD-10-CM | POA: Diagnosis not present

## 2018-05-04 DIAGNOSIS — E119 Type 2 diabetes mellitus without complications: Secondary | ICD-10-CM | POA: Diagnosis not present

## 2018-05-04 DIAGNOSIS — E1159 Type 2 diabetes mellitus with other circulatory complications: Secondary | ICD-10-CM | POA: Diagnosis not present

## 2018-05-04 DIAGNOSIS — I10 Essential (primary) hypertension: Secondary | ICD-10-CM | POA: Diagnosis not present

## 2018-05-04 DIAGNOSIS — Z794 Long term (current) use of insulin: Secondary | ICD-10-CM | POA: Diagnosis not present

## 2018-06-04 DIAGNOSIS — H401221 Low-tension glaucoma, left eye, mild stage: Secondary | ICD-10-CM | POA: Diagnosis not present

## 2018-06-08 ENCOUNTER — Other Ambulatory Visit: Payer: Self-pay | Admitting: Family Medicine

## 2018-06-08 DIAGNOSIS — R1032 Left lower quadrant pain: Secondary | ICD-10-CM | POA: Diagnosis not present

## 2018-06-15 ENCOUNTER — Ambulatory Visit
Admission: RE | Admit: 2018-06-15 | Discharge: 2018-06-15 | Disposition: A | Discharge status: Home / Self Care | Payer: PPO | Source: Ambulatory Visit | Attending: Family Medicine | Admitting: Family Medicine

## 2018-06-15 DIAGNOSIS — E1169 Type 2 diabetes mellitus with other specified complication: Secondary | ICD-10-CM | POA: Diagnosis not present

## 2018-06-15 DIAGNOSIS — N200 Calculus of kidney: Secondary | ICD-10-CM | POA: Diagnosis not present

## 2018-06-15 DIAGNOSIS — Z0000 Encounter for general adult medical examination without abnormal findings: Secondary | ICD-10-CM | POA: Diagnosis not present

## 2018-06-15 DIAGNOSIS — E1159 Type 2 diabetes mellitus with other circulatory complications: Secondary | ICD-10-CM | POA: Diagnosis not present

## 2018-06-15 DIAGNOSIS — R1032 Left lower quadrant pain: Secondary | ICD-10-CM | POA: Insufficient documentation

## 2018-06-15 DIAGNOSIS — I10 Essential (primary) hypertension: Secondary | ICD-10-CM | POA: Diagnosis not present

## 2018-06-15 DIAGNOSIS — E785 Hyperlipidemia, unspecified: Secondary | ICD-10-CM | POA: Diagnosis not present

## 2018-06-15 IMAGING — CT CT ABD-PELV W/ CM
2 of 5 series · 13 of 46 positions shown, 15 images · IV contrast (iopamidol)
Comparison: CT abdomen and pelvis-[DATE]

CLINICAL DATA: Mid upper abdominal pain for the past 4 months.
Concern for pancreatitis.

EXAM:
CT ABDOMEN AND PELVIS WITH CONTRAST
TECHNIQUE: Multidetector CT imaging of the abdomen and pelvis was performed
using the standard protocol following bolus administration of
intravenous contrast.
CONTRAST:  100mL [WC] IOPAMIDOL ([WC]) INJECTION 61%

[Series 2: routine abd/pel with · axial · 0.80mm/px · z∈[-770,-280]mm · 10 of 112 slices shown, 12 images]
[im 7/112  soft-tissue]
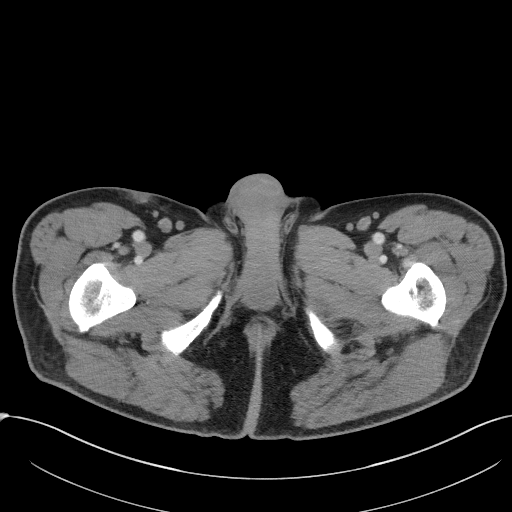
[im 7/112  bone]
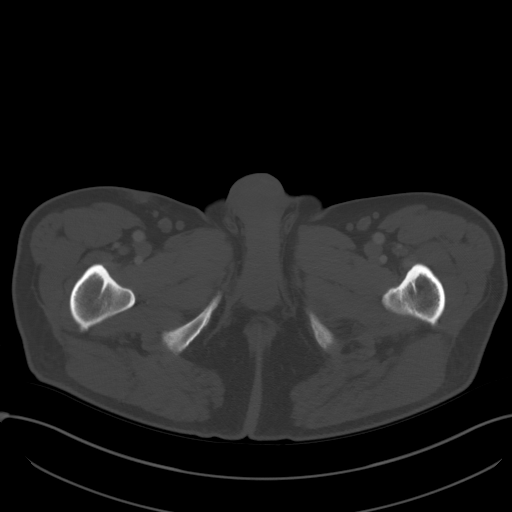
[im 19/112  soft-tissue]
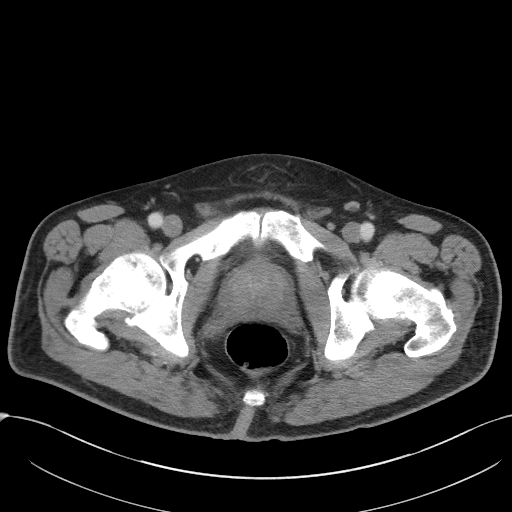
[im 31/112  soft-tissue]
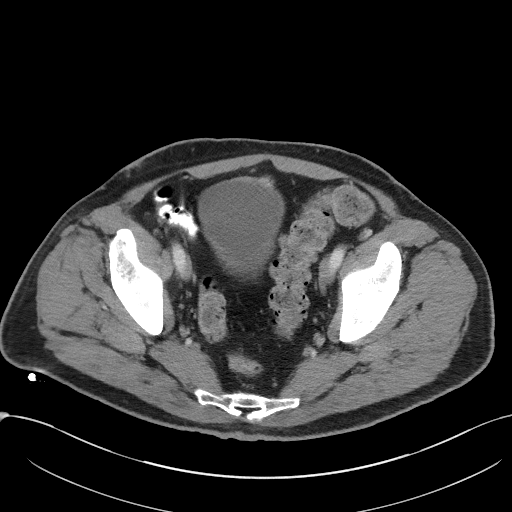
[im 38/112  soft-tissue]
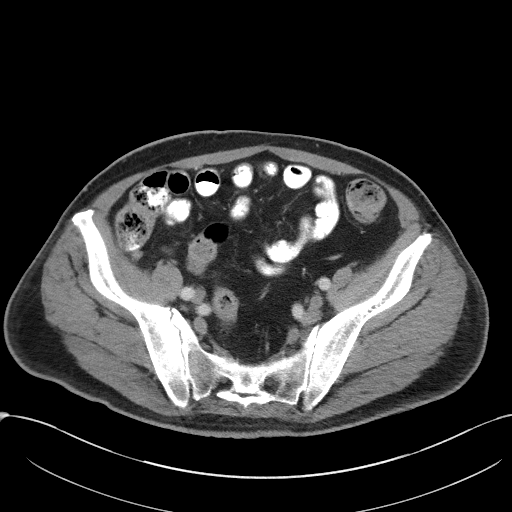
[im 50/112  soft-tissue]
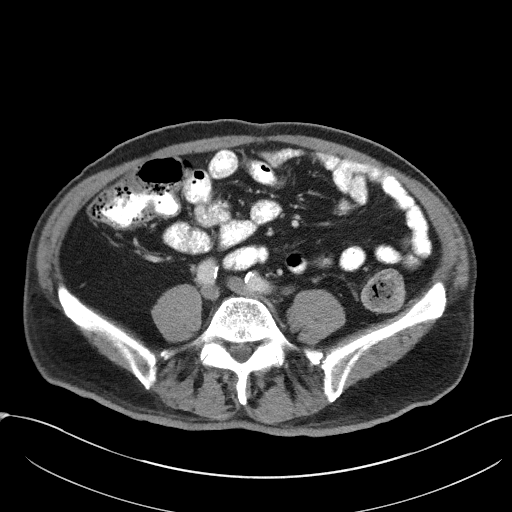
[im 62/112  soft-tissue]
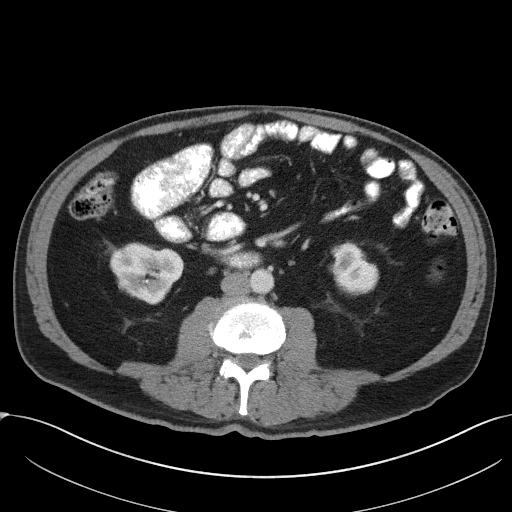
[im 75/112  soft-tissue]
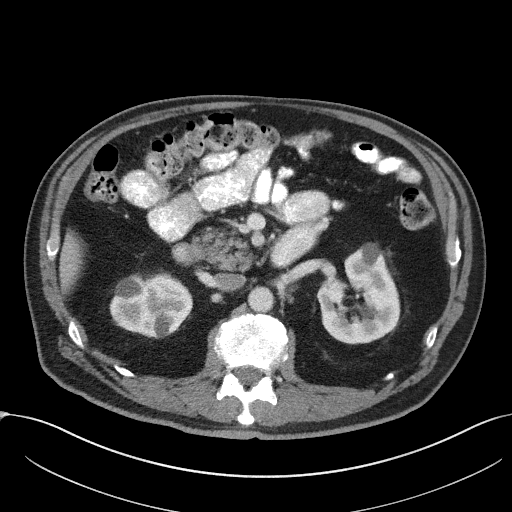
[im 81/112  soft-tissue]
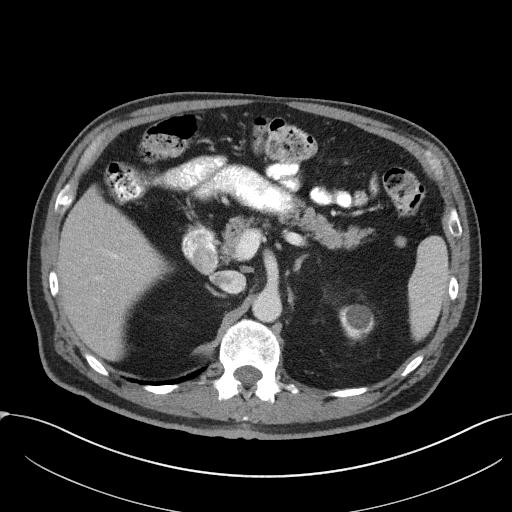
[im 93/112  soft-tissue]
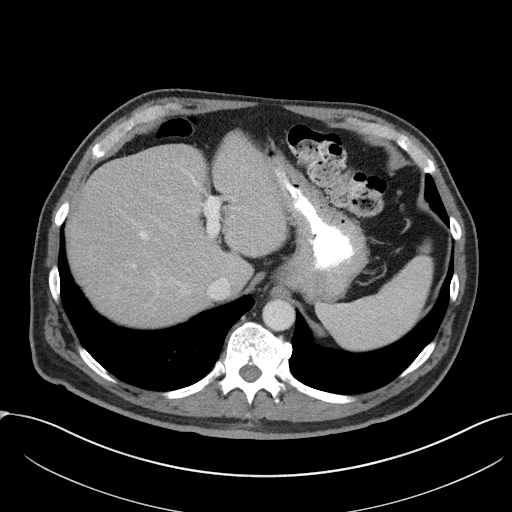
[im 93/112  bone]
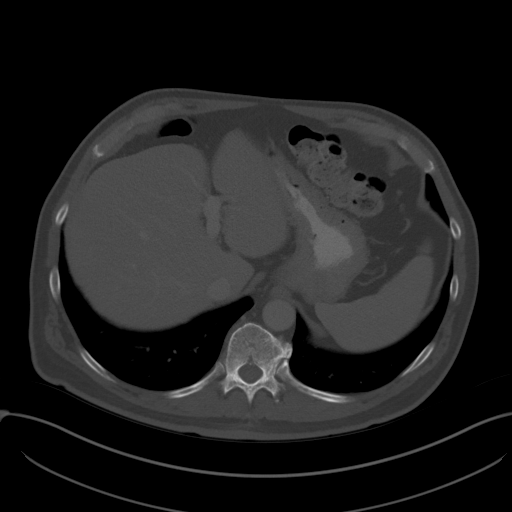
[im 105/112  soft-tissue]
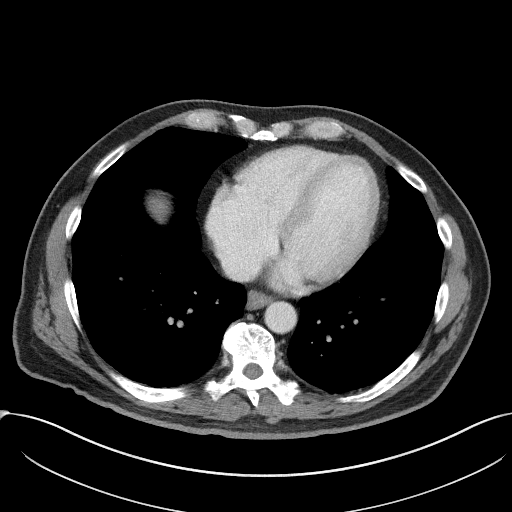

[Series 5: coronal st · coronal · 0.89mm/px · 3 of 94 slices shown]
[im 32/94  soft-tissue]
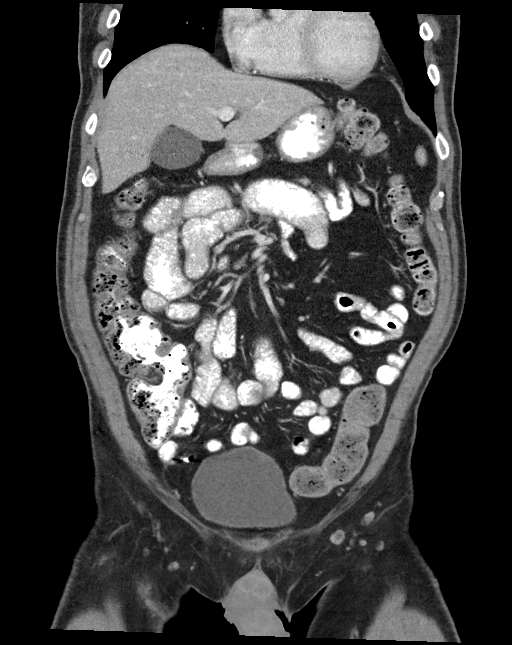
[im 42/94  soft-tissue]
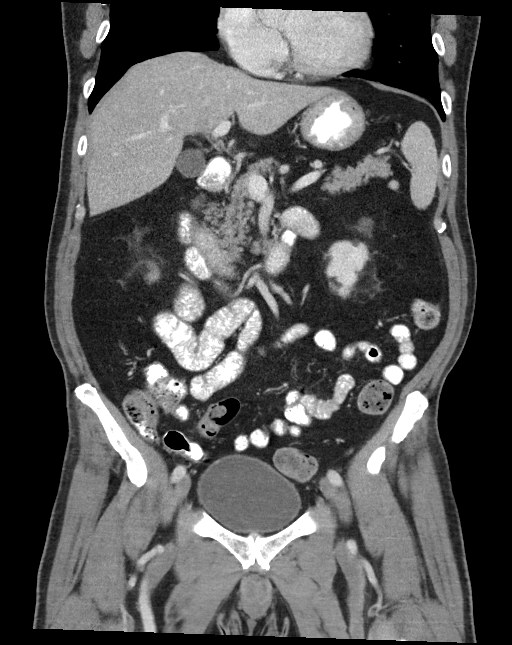
[im 52/94  soft-tissue]
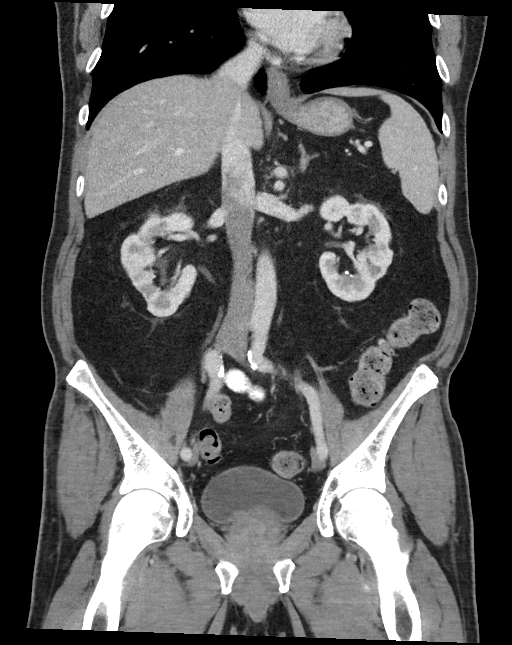

[13 of 46 positions shown; findings below may reference images not displayed]

FINDINGS: Lower chest: Limited visualization of the lower thorax is negative
for focal airspace opacity or pleural effusion.

Normal heart size.  No pericardial effusion.

Hepatobiliary: Normal hepatic contour. Note is made of a punctate
subcentimeter hypoattenuating lesion within the anterior segment of
the right lobe of the liver, too small to accurately characterize
though favored to represent a hepatic cyst. No discrete worrisome
hepatic lesions. Normal appearance of the gallbladder given degree
distention. No radiopaque gallstones. No intra extrahepatic biliary
ductal dilatation. No ascites.

Pancreas: Normal appearance of the pancreas. No definitive
peripancreatic stranding. There is homogeneous enhancement of the
pancreatic parenchyma without evidence of necrosis. No definitive
pancreatic mass or pancreatic ductal dilatation on this non
pancreatic protocol CT.

Spleen: Normal appearance of the spleen. Note is made of a small
splenule.

Adrenals/Urinary Tract: There is symmetric enhancement and excretion
of the bilateral kidneys. Bilateral hypoattenuating nonenhancing
renal cysts with dominant partially exophytic cyst arising from the
anterior superior aspect the right kidney measuring approximately 2
cm in diameter (image 38, series 2) and dominant cyst arising from
the superior pole of the left kidney measuring approximately 2.2 cm
(image 32, series 2).

There is a additional approximately 1.4 cm hyperattenuating and
potentially enhancing exophytic lesion arising from the inferior
pole of the left kidney (axial image 52, series 2; image 32, series
6; coronal image 50, series 5), which cannot be characterized as a
simple renal cyst, and has likely increased in size compared to the
[DATE] examination, previously, 0.7 cm.

Note is made of 3 nonobstructing right-sided renal stones with
dominant stone within the inferior pole the right kidney measuring
0.4 cm (coronal image 54, series 5) and 6 nonobstructing left-sided
renal stones with dominant nonobstructing stone with interpolar
aspect the left kidney measuring 0.7 cm (coronal image 50, series
5).

No renal stones are seen along the expected course of either ureter
or within urinary bladder. Note is made of a punctate phleboliths
adjacent to the distal aspect the right ureter (coronal image 66,
series 5), similar to abdominal CT performed [DATE]. There is a
minimal amount of likely age and body habitus related perinephric
stranding. No urinary obstruction.

Normal appearance the bilateral adrenal glands.

Normal appearance of the urinary bladder given degree of distention
however there is mass effect from the prostate upon the undersurface
of the urinary bladder (sagittal image 64, series 7).

Stomach/Bowel: Large colonic stool burden without evidence of
enteric obstruction. Enteric contrast extends to the level of the
splenic flexure of the colon. Normal appearance of the terminal
ileum and retrocecal appendix. Potential mild circumferential wall
thickening involving the distal aspect of the esophagus, potentially
accentuated due to underdistention (image 13, series 2). No
definitive hiatal hernia. No pneumoperitoneum, pneumatosis or portal
venous gas.

Vascular/Lymphatic: Scattered atherosclerotic plaque within a
tortuous but normal caliber abdominal aorta. The major branch
vessels of the abdominal aorta appear patent on this non CTA
examination.

No bulky retroperitoneal, mesenteric, pelvic or inguinal
lymphadenopathy.

Reproductive: Prostate is enlarged with mass effect on the
undersurface of the urinary bladder (sagittal image 64, series 7).

Other: There is ill-defined stranding about the ventral aspect the
right lower abdomen/pelvis (image 81, series 2) as well as about the
anterior aspect the right upper thigh (image 105, series 2),
potentially the sequela of recent injury or subcutaneous medication
administration.

Musculoskeletal: No acute or aggressive osseous abnormalities.
Moderate severe multilevel lumbar spine DDD, worse at L5-S1 with
disc space height loss, endplate irregularity and sclerosis.
IMPRESSION: 1. Potential mild circumferential wall thickening involving the
distal aspect the esophagus, potentially accentuated due to
underdistention, though could be seen in the setting of esophagitis.
Further evaluation with endoscopy could be performed as indicated.
2. Otherwise, no explanation for patient's chronic upper abdominal
pain. Specifically, no CT evidence of pancreatitis.
3. **An incidental finding of potential clinical significance has
been found. Interval increase in size of now approximately 1.4 cm
hyperattenuating potentially enhancing lesion arising from the
inferior pole of the left kidney. Further evaluation with
nonemergent abdominal MRI is recommended for further evaluation.**
4. Bilateral nonobstructing nephrolithiasis.
5.  Aortic Atherosclerosis ([WC]-[WC]).

These results will be called to the ordering clinician or
representative by the Radiologist Assistant, and communication
documented in the PACS or zVision Dashboard.

## 2018-06-15 MED ORDER — IOPAMIDOL (ISOVUE-300) INJECTION 61%
100.0000 mL | Freq: Once | INTRAVENOUS | Status: AC | PRN
  Administered 2018-06-15: 100 mL via INTRAVENOUS

## 2018-06-22 DIAGNOSIS — I10 Essential (primary) hypertension: Secondary | ICD-10-CM | POA: Diagnosis not present

## 2018-06-22 DIAGNOSIS — Z0000 Encounter for general adult medical examination without abnormal findings: Secondary | ICD-10-CM | POA: Diagnosis not present

## 2018-06-22 DIAGNOSIS — E1169 Type 2 diabetes mellitus with other specified complication: Secondary | ICD-10-CM | POA: Diagnosis not present

## 2018-06-22 DIAGNOSIS — E785 Hyperlipidemia, unspecified: Secondary | ICD-10-CM | POA: Diagnosis not present

## 2018-06-22 DIAGNOSIS — E1159 Type 2 diabetes mellitus with other circulatory complications: Secondary | ICD-10-CM | POA: Diagnosis not present

## 2018-07-06 DIAGNOSIS — N281 Cyst of kidney, acquired: Secondary | ICD-10-CM | POA: Diagnosis not present

## 2018-07-06 DIAGNOSIS — N200 Calculus of kidney: Secondary | ICD-10-CM | POA: Diagnosis not present

## 2018-07-06 DIAGNOSIS — D49512 Neoplasm of unspecified behavior of left kidney: Secondary | ICD-10-CM | POA: Diagnosis not present

## 2018-07-15 DIAGNOSIS — R1032 Left lower quadrant pain: Secondary | ICD-10-CM | POA: Diagnosis not present

## 2018-07-15 DIAGNOSIS — R109 Unspecified abdominal pain: Secondary | ICD-10-CM | POA: Diagnosis not present

## 2018-08-12 DIAGNOSIS — E1159 Type 2 diabetes mellitus with other circulatory complications: Secondary | ICD-10-CM | POA: Diagnosis not present

## 2018-08-12 DIAGNOSIS — E1169 Type 2 diabetes mellitus with other specified complication: Secondary | ICD-10-CM | POA: Diagnosis not present

## 2018-08-12 DIAGNOSIS — E785 Hyperlipidemia, unspecified: Secondary | ICD-10-CM | POA: Diagnosis not present

## 2018-08-12 DIAGNOSIS — I10 Essential (primary) hypertension: Secondary | ICD-10-CM | POA: Diagnosis not present

## 2018-09-23 DIAGNOSIS — D045 Carcinoma in situ of skin of trunk: Secondary | ICD-10-CM | POA: Diagnosis not present

## 2018-09-23 DIAGNOSIS — D2230 Melanocytic nevi of unspecified part of face: Secondary | ICD-10-CM | POA: Diagnosis not present

## 2018-09-23 DIAGNOSIS — Z85828 Personal history of other malignant neoplasm of skin: Secondary | ICD-10-CM | POA: Diagnosis not present

## 2018-09-23 DIAGNOSIS — L570 Actinic keratosis: Secondary | ICD-10-CM | POA: Diagnosis not present

## 2018-09-23 DIAGNOSIS — D485 Neoplasm of uncertain behavior of skin: Secondary | ICD-10-CM | POA: Diagnosis not present

## 2018-09-23 DIAGNOSIS — D2271 Melanocytic nevi of right lower limb, including hip: Secondary | ICD-10-CM | POA: Diagnosis not present

## 2018-09-23 DIAGNOSIS — Z23 Encounter for immunization: Secondary | ICD-10-CM | POA: Diagnosis not present

## 2018-09-28 DIAGNOSIS — C61 Malignant neoplasm of prostate: Secondary | ICD-10-CM | POA: Diagnosis not present

## 2018-10-05 DIAGNOSIS — N400 Enlarged prostate without lower urinary tract symptoms: Secondary | ICD-10-CM | POA: Diagnosis not present

## 2018-10-05 DIAGNOSIS — C61 Malignant neoplasm of prostate: Secondary | ICD-10-CM | POA: Diagnosis not present

## 2018-10-14 DIAGNOSIS — D045 Carcinoma in situ of skin of trunk: Secondary | ICD-10-CM | POA: Diagnosis not present

## 2018-11-03 ENCOUNTER — Other Ambulatory Visit: Payer: Self-pay | Admitting: Internal Medicine

## 2018-12-14 DIAGNOSIS — E1169 Type 2 diabetes mellitus with other specified complication: Secondary | ICD-10-CM | POA: Diagnosis not present

## 2018-12-14 DIAGNOSIS — I10 Essential (primary) hypertension: Secondary | ICD-10-CM | POA: Diagnosis not present

## 2018-12-14 DIAGNOSIS — E1159 Type 2 diabetes mellitus with other circulatory complications: Secondary | ICD-10-CM | POA: Diagnosis not present

## 2018-12-14 DIAGNOSIS — E785 Hyperlipidemia, unspecified: Secondary | ICD-10-CM | POA: Diagnosis not present

## 2018-12-16 DIAGNOSIS — E785 Hyperlipidemia, unspecified: Secondary | ICD-10-CM | POA: Diagnosis not present

## 2018-12-16 DIAGNOSIS — E1169 Type 2 diabetes mellitus with other specified complication: Secondary | ICD-10-CM | POA: Diagnosis not present

## 2018-12-16 DIAGNOSIS — I10 Essential (primary) hypertension: Secondary | ICD-10-CM | POA: Diagnosis not present

## 2018-12-16 DIAGNOSIS — E119 Type 2 diabetes mellitus without complications: Secondary | ICD-10-CM | POA: Diagnosis not present

## 2018-12-16 DIAGNOSIS — E1159 Type 2 diabetes mellitus with other circulatory complications: Secondary | ICD-10-CM | POA: Diagnosis not present

## 2018-12-16 DIAGNOSIS — Z794 Long term (current) use of insulin: Secondary | ICD-10-CM | POA: Diagnosis not present

## 2018-12-21 ENCOUNTER — Other Ambulatory Visit: Payer: Self-pay | Admitting: Urology

## 2018-12-21 DIAGNOSIS — I2510 Atherosclerotic heart disease of native coronary artery without angina pectoris: Secondary | ICD-10-CM | POA: Diagnosis not present

## 2018-12-21 DIAGNOSIS — C61 Malignant neoplasm of prostate: Secondary | ICD-10-CM

## 2018-12-21 DIAGNOSIS — Z0000 Encounter for general adult medical examination without abnormal findings: Secondary | ICD-10-CM | POA: Diagnosis not present

## 2019-01-02 ENCOUNTER — Other Ambulatory Visit: Payer: Self-pay | Admitting: Internal Medicine

## 2019-01-25 DIAGNOSIS — I10 Essential (primary) hypertension: Secondary | ICD-10-CM | POA: Diagnosis not present

## 2019-01-25 DIAGNOSIS — E119 Type 2 diabetes mellitus without complications: Secondary | ICD-10-CM | POA: Diagnosis not present

## 2019-01-25 DIAGNOSIS — E785 Hyperlipidemia, unspecified: Secondary | ICD-10-CM | POA: Diagnosis not present

## 2019-01-25 DIAGNOSIS — Z794 Long term (current) use of insulin: Secondary | ICD-10-CM | POA: Diagnosis not present

## 2019-01-25 DIAGNOSIS — C61 Malignant neoplasm of prostate: Secondary | ICD-10-CM | POA: Diagnosis not present

## 2019-01-25 DIAGNOSIS — E1169 Type 2 diabetes mellitus with other specified complication: Secondary | ICD-10-CM | POA: Diagnosis not present

## 2019-01-25 DIAGNOSIS — I208 Other forms of angina pectoris: Secondary | ICD-10-CM | POA: Diagnosis not present

## 2019-01-25 DIAGNOSIS — R300 Dysuria: Secondary | ICD-10-CM | POA: Diagnosis not present

## 2019-01-25 DIAGNOSIS — E1159 Type 2 diabetes mellitus with other circulatory complications: Secondary | ICD-10-CM | POA: Diagnosis not present

## 2019-01-25 DIAGNOSIS — I2510 Atherosclerotic heart disease of native coronary artery without angina pectoris: Secondary | ICD-10-CM | POA: Diagnosis not present

## 2019-01-26 DIAGNOSIS — H401221 Low-tension glaucoma, left eye, mild stage: Secondary | ICD-10-CM | POA: Diagnosis not present

## 2019-01-26 DIAGNOSIS — H2511 Age-related nuclear cataract, right eye: Secondary | ICD-10-CM | POA: Diagnosis not present

## 2019-01-26 DIAGNOSIS — E119 Type 2 diabetes mellitus without complications: Secondary | ICD-10-CM | POA: Diagnosis not present

## 2019-01-26 DIAGNOSIS — H524 Presbyopia: Secondary | ICD-10-CM | POA: Diagnosis not present

## 2019-01-30 ENCOUNTER — Other Ambulatory Visit: Payer: Self-pay | Admitting: Internal Medicine

## 2019-01-31 DIAGNOSIS — L237 Allergic contact dermatitis due to plants, except food: Secondary | ICD-10-CM | POA: Diagnosis not present

## 2019-01-31 DIAGNOSIS — W57XXXA Bitten or stung by nonvenomous insect and other nonvenomous arthropods, initial encounter: Secondary | ICD-10-CM | POA: Diagnosis not present

## 2019-01-31 DIAGNOSIS — S80862A Insect bite (nonvenomous), left lower leg, initial encounter: Secondary | ICD-10-CM | POA: Diagnosis not present

## 2019-04-07 DIAGNOSIS — W57XXXA Bitten or stung by nonvenomous insect and other nonvenomous arthropods, initial encounter: Secondary | ICD-10-CM | POA: Diagnosis not present

## 2019-04-07 DIAGNOSIS — L570 Actinic keratosis: Secondary | ICD-10-CM | POA: Diagnosis not present

## 2019-04-07 DIAGNOSIS — D2230 Melanocytic nevi of unspecified part of face: Secondary | ICD-10-CM | POA: Diagnosis not present

## 2019-04-07 DIAGNOSIS — S80862A Insect bite (nonvenomous), left lower leg, initial encounter: Secondary | ICD-10-CM | POA: Diagnosis not present

## 2019-04-07 DIAGNOSIS — C61 Malignant neoplasm of prostate: Secondary | ICD-10-CM | POA: Diagnosis not present

## 2019-04-07 DIAGNOSIS — Z85828 Personal history of other malignant neoplasm of skin: Secondary | ICD-10-CM | POA: Diagnosis not present

## 2019-04-07 DIAGNOSIS — D2271 Melanocytic nevi of right lower limb, including hip: Secondary | ICD-10-CM | POA: Diagnosis not present

## 2019-04-08 DIAGNOSIS — R079 Chest pain, unspecified: Secondary | ICD-10-CM | POA: Diagnosis not present

## 2019-04-08 DIAGNOSIS — I10 Essential (primary) hypertension: Secondary | ICD-10-CM | POA: Diagnosis not present

## 2019-04-08 DIAGNOSIS — I2510 Atherosclerotic heart disease of native coronary artery without angina pectoris: Secondary | ICD-10-CM | POA: Diagnosis not present

## 2019-04-08 DIAGNOSIS — E119 Type 2 diabetes mellitus without complications: Secondary | ICD-10-CM | POA: Diagnosis not present

## 2019-04-08 DIAGNOSIS — Z794 Long term (current) use of insulin: Secondary | ICD-10-CM | POA: Diagnosis not present

## 2019-04-08 DIAGNOSIS — E1159 Type 2 diabetes mellitus with other circulatory complications: Secondary | ICD-10-CM | POA: Diagnosis not present

## 2019-04-08 DIAGNOSIS — C61 Malignant neoplasm of prostate: Secondary | ICD-10-CM | POA: Diagnosis not present

## 2019-04-08 DIAGNOSIS — I208 Other forms of angina pectoris: Secondary | ICD-10-CM | POA: Diagnosis not present

## 2019-04-08 DIAGNOSIS — E785 Hyperlipidemia, unspecified: Secondary | ICD-10-CM | POA: Diagnosis not present

## 2019-04-08 DIAGNOSIS — E1169 Type 2 diabetes mellitus with other specified complication: Secondary | ICD-10-CM | POA: Diagnosis not present

## 2019-04-12 ENCOUNTER — Other Ambulatory Visit: Payer: Self-pay

## 2019-04-12 ENCOUNTER — Ambulatory Visit
Admission: RE | Admit: 2019-04-12 | Discharge: 2019-04-12 | Disposition: A | Discharge status: Home / Self Care | Payer: PPO | Source: Ambulatory Visit | Attending: Urology | Admitting: Urology

## 2019-04-12 DIAGNOSIS — C61 Malignant neoplasm of prostate: Secondary | ICD-10-CM

## 2019-04-12 DIAGNOSIS — N4289 Other specified disorders of prostate: Secondary | ICD-10-CM | POA: Diagnosis not present

## 2019-04-12 IMAGING — MR MR PROSTATE WO/W CM
56 series · 56 of 56 positions shown · IV contrast (19ml Multihance)
Comparison: None.

CLINICAL DATA: Prostate cancer, Gleason 3+3=6 disease involving
small portions of cores at the right apex, right lateral base, and
left mid lateral gland on biopsy of [DATE].

EXAM:
MR PROSTATE WITHOUT AND WITH CONTRAST
TECHNIQUE: Multiplanar multisequence MRI images were obtained of the pelvis
centered about the prostate. Pre and post contrast images were
obtained.
CONTRAST:  19mL MULTIHANCE GADOBENATE DIMEGLUMINE 529 MG/ML IV SOLN

[Series 4: bSSFP fat-sat · axial · 8.0mm · 0.74mm/px · 1 of 28 slices shown]
[im 1/28]
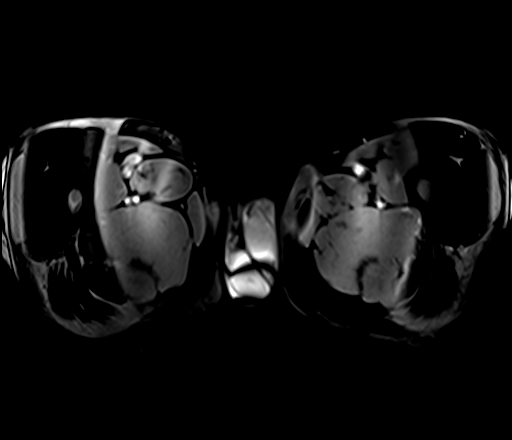

[Series 5: T1 · axial · 8.0mm · 1.06mm/px · 1 of 28 slices shown (1 of 2)]
[im 1/28]
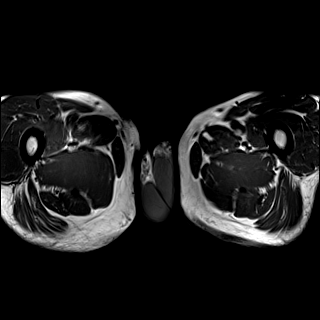

[Series 6: T2 · sagittal · 3.5mm · 0.56mm/px · 1 of 39 slices shown (1 of 4)]
[im 1/39]
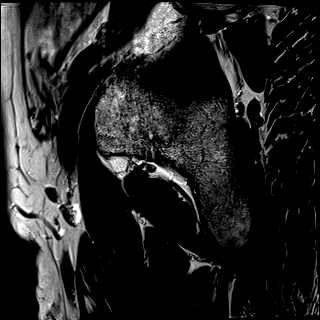

[Series 7: T1 · axial · 3.0mm · 0.31mm/px · 1 of 24 slices shown (2 of 2)]
[im 1/24]
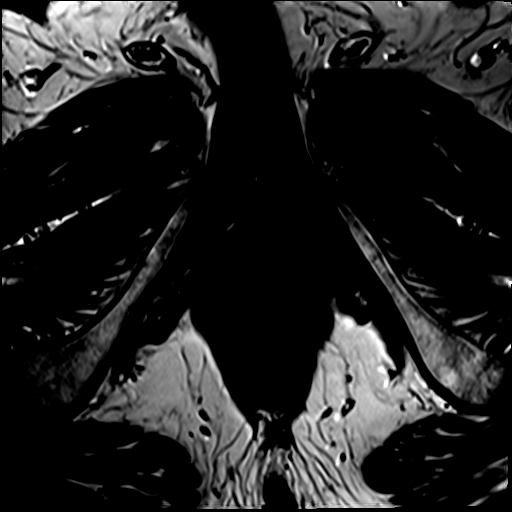

[Series 8: T2 · axial · 3.5mm · 0.56mm/px · 1 of 23 slices shown (2 of 4)]
[im 1/23]
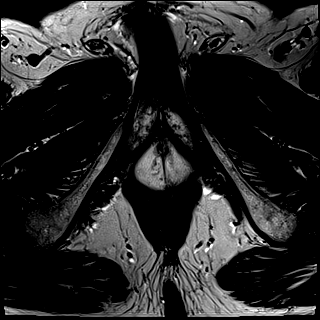

[Series 9: T2 · axial · 1.0mm · 1.04mm/px · 1 of 80 slices shown (3 of 4)]
[im 1/80]
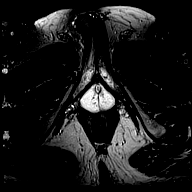

[Series 10: T2 · coronal · 3.5mm · 0.56mm/px · 1 of 23 slices shown (4 of 4)]
[im 1/23]
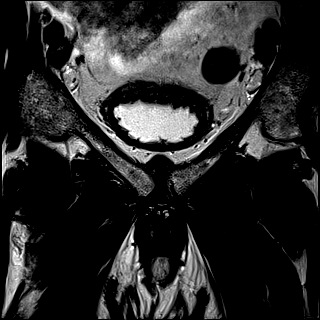

[Series 11: DWI · axial · 3.5mm · 1.56mm/px · 1 of 59 slices shown (1 of 2)]
[im 1/59]
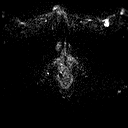

[Series 12: DWI · axial · 3.5mm · 1.56mm/px · 1 of 20 slices shown (2 of 2)]
[im 1/20]
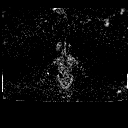

[Series 13: pre t1_twist_tra_dyn_ttc=5.8s · axial · non-contrast · 3.5mm · 0.83mm/px · 1 of 22 slices shown]
[im 1/22]
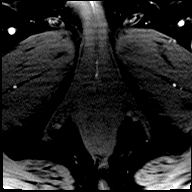

[Series 14: post t1_twist_tra_dyn-copy center · axial · 3.5mm · 0.83mm/px · 1 of 22 slices shown (1 of 24)]
[im 1/22]
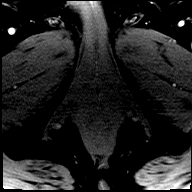

[Series 15: post t1_twist_tra_dyn-copy center · axial · 3.5mm · 0.83mm/px · 1 of 22 slices shown (2 of 24)]
[im 1/22]
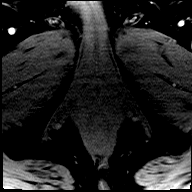

[Series 16: post t1_twist_tra_dyn-copy cent_sub_ttc=(id) · axial · 3.5mm · 0.83mm/px · 1 of 11 slices shown (1 of 22)]
[im 1/11]
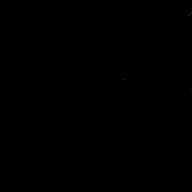

[Series 17: post t1_twist_tra_dyn-copy center · axial · 3.5mm · 0.83mm/px · 1 of 22 slices shown (3 of 24)]
[im 1/22]
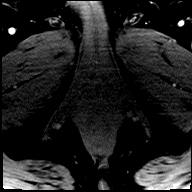

[Series 18: post t1_twist_tra_dyn-copy cent_sub_ttc=(id) · axial · 3.5mm · 0.83mm/px · 1 of 18 slices shown (2 of 22)]
[im 1/18]
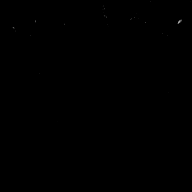

[Series 19: post t1_twist_tra_dyn-copy center · axial · 3.5mm · 0.83mm/px · 1 of 22 slices shown (4 of 24)]
[im 1/22]
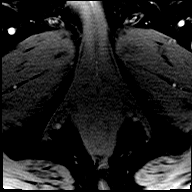

[Series 20: post t1_twist_tra_dyn-copy cent_sub_ttc=(id) · axial · 3.5mm · 0.83mm/px · 1 of 13 slices shown (3 of 22)]
[im 1/13]
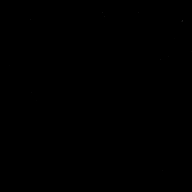

[Series 21: post t1_twist_tra_dyn-copy center · axial · 3.5mm · 0.83mm/px · 1 of 22 slices shown (5 of 24)]
[im 1/22]
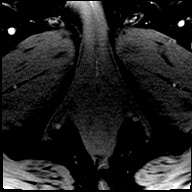

[Series 22: post t1_twist_tra_dyn-copy cent_sub_ttc=(id) · axial · 3.5mm · 0.83mm/px · 1 of 18 slices shown (4 of 22)]
[im 1/18]
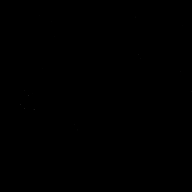

[Series 23: post t1_twist_tra_dyn-copy center · axial · 3.5mm · 0.83mm/px · 1 of 22 slices shown (6 of 24)]
[im 1/22]
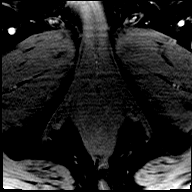

[Series 24: post t1_twist_tra_dyn-copy cent_sub_ttc=(id) · axial · 3.5mm · 0.83mm/px · 1 of 21 slices shown (5 of 22)]
[im 1/21]
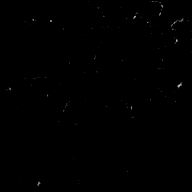

[Series 25: post t1_twist_tra_dyn-copy center · axial · 3.5mm · 0.83mm/px · 1 of 22 slices shown (7 of 24)]
[im 1/22]
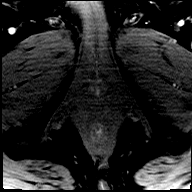

[Series 26: post t1_twist_tra_dyn-copy cent_sub_ttc=(id) · axial · 3.5mm · 0.83mm/px · 1 of 22 slices shown (6 of 22)]
[im 1/22]
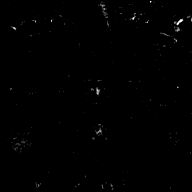

[Series 27: post t1_twist_tra_dyn-copy center · axial · 3.5mm · 0.83mm/px · 1 of 22 slices shown (8 of 24)]
[im 1/22]
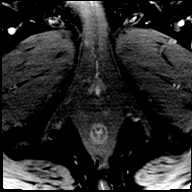

[Series 28: post t1_twist_tra_dyn-copy cent_sub_ttc=(id) · axial · 3.5mm · 0.83mm/px · 1 of 22 slices shown (7 of 22)]
[im 1/22]
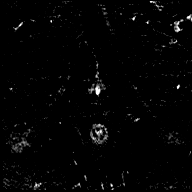

[Series 29: post t1_twist_tra_dyn-copy center · axial · 3.5mm · 0.83mm/px · 1 of 22 slices shown (9 of 24)]
[im 1/22]
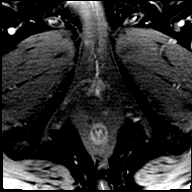

[Series 30: post t1_twist_tra_dyn-copy cent_sub_ttc=(id) · axial · 3.5mm · 0.83mm/px · 1 of 22 slices shown (8 of 22)]
[im 1/22]
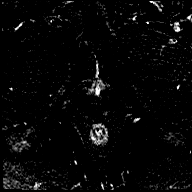

[Series 31: post t1_twist_tra_dyn-copy center · axial · 3.5mm · 0.83mm/px · 1 of 22 slices shown (10 of 24)]
[im 1/22]
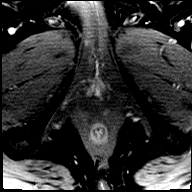

[Series 32: post t1_twist_tra_dyn-copy cent_sub_ttc=(id) · axial · 3.5mm · 0.83mm/px · 1 of 22 slices shown (9 of 22)]
[im 1/22]
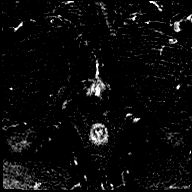

[Series 33: post t1_twist_tra_dyn-copy center · axial · 3.5mm · 0.83mm/px · 1 of 22 slices shown (11 of 24)]
[im 1/22]
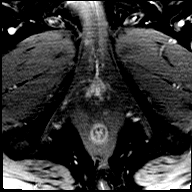

[Series 34: post t1_twist_tra_dyn-copy cent_sub_ttc=(id) · axial · 3.5mm · 0.83mm/px · 1 of 22 slices shown (10 of 22)]
[im 1/22]
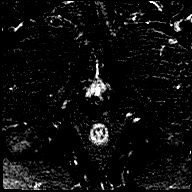

[Series 35: post t1_twist_tra_dyn-copy center · axial · 3.5mm · 0.83mm/px · 1 of 22 slices shown (12 of 24)]
[im 1/22]
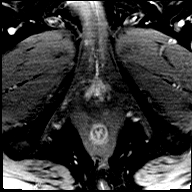

[Series 36: post t1_twist_tra_dyn-copy cent_sub_ttc=(id) · axial · 3.5mm · 0.83mm/px · 1 of 22 slices shown (11 of 22)]
[im 1/22]
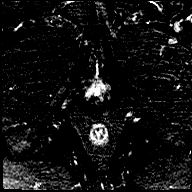

[Series 37: post t1_twist_tra_dyn-copy center · axial · 3.5mm · 0.83mm/px · 1 of 22 slices shown (13 of 24)]
[im 1/22]
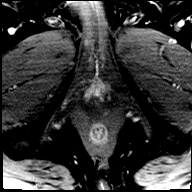

[Series 38: post t1_twist_tra_dyn-copy cent_sub_ttc=(id) · axial · 3.5mm · 0.83mm/px · 1 of 22 slices shown (12 of 22)]
[im 1/22]
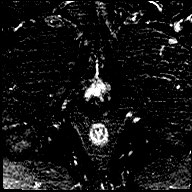

[Series 39: post t1_twist_tra_dyn-copy center · axial · 3.5mm · 0.83mm/px · 1 of 22 slices shown (14 of 24)]
[im 1/22]
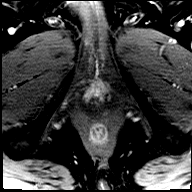

[Series 40: post t1_twist_tra_dyn-copy cent_sub_ttc=(id) · axial · 3.5mm · 0.83mm/px · 1 of 22 slices shown (13 of 22)]
[im 1/22]
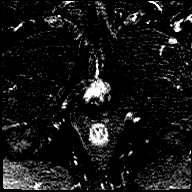

[Series 41: post t1_twist_tra_dyn-copy center · axial · 3.5mm · 0.83mm/px · 1 of 22 slices shown (15 of 24)]
[im 1/22]
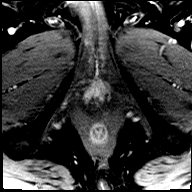

[Series 42: post t1_twist_tra_dyn-copy cent_sub_ttc=(id) · axial · 3.5mm · 0.83mm/px · 1 of 22 slices shown (14 of 22)]
[im 1/22]
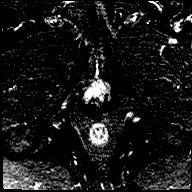

[Series 43: post t1_twist_tra_dyn-copy center · axial · 3.5mm · 0.83mm/px · 1 of 22 slices shown (16 of 24)]
[im 1/22]
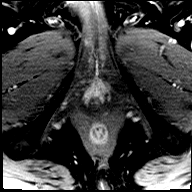

[Series 44: post t1_twist_tra_dyn-copy cent_sub_ttc=(id) · axial · 3.5mm · 0.83mm/px · 1 of 22 slices shown (15 of 22)]
[im 1/22]
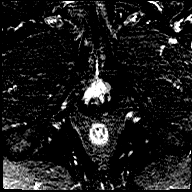

[Series 45: post t1_twist_tra_dyn-copy center · axial · 3.5mm · 0.83mm/px · 1 of 22 slices shown (17 of 24)]
[im 1/22]
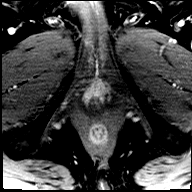

[Series 46: post t1_twist_tra_dyn-copy cent_sub_ttc=(id) · axial · 3.5mm · 0.83mm/px · 1 of 22 slices shown (16 of 22)]
[im 1/22]
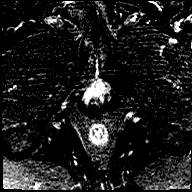

[Series 47: post t1_twist_tra_dyn-copy center · axial · 3.5mm · 0.83mm/px · 1 of 22 slices shown (18 of 24)]
[im 1/22]
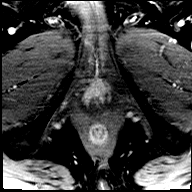

[Series 48: post t1_twist_tra_dyn-copy cent_sub_ttc=(id) · axial · 3.5mm · 0.83mm/px · 1 of 22 slices shown (17 of 22)]
[im 1/22]
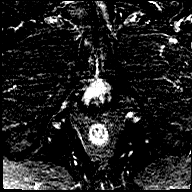

[Series 49: post t1_twist_tra_dyn-copy center · axial · 3.5mm · 0.83mm/px · 1 of 22 slices shown (19 of 24)]
[im 1/22]
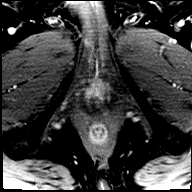

[Series 50: post t1_twist_tra_dyn-copy cent_sub_ttc=(id) · axial · 3.5mm · 0.83mm/px · 1 of 22 slices shown (18 of 22)]
[im 1/22]
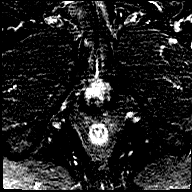

[Series 51: post t1_twist_tra_dyn-copy center · axial · 3.5mm · 0.83mm/px · 1 of 22 slices shown (20 of 24)]
[im 1/22]
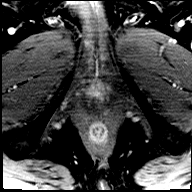

[Series 52: post t1_twist_tra_dyn-copy cent_sub_ttc=(id) · axial · 3.5mm · 0.83mm/px · 1 of 22 slices shown (19 of 22)]
[im 1/22]
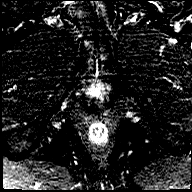

[Series 53: post t1_twist_tra_dyn-copy center · axial · 3.5mm · 0.83mm/px · 1 of 22 slices shown (21 of 24)]
[im 1/22]
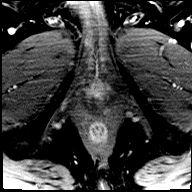

[Series 54: post t1_twist_tra_dyn-copy cent_sub_ttc=(id) · axial · 3.5mm · 0.83mm/px · 1 of 22 slices shown (20 of 22)]
[im 1/22]
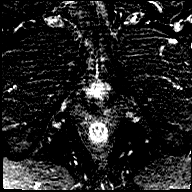

[Series 55: post t1_twist_tra_dyn-copy center · axial · 3.5mm · 0.83mm/px · 1 of 22 slices shown (22 of 24)]
[im 1/22]
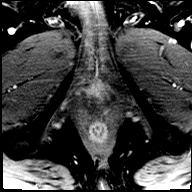

[Series 56: post t1_twist_tra_dyn-copy cent_sub_ttc=(id) · axial · 3.5mm · 0.83mm/px · 1 of 22 slices shown (21 of 22)]
[im 1/22]
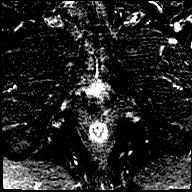

[Series 57: post t1_twist_tra_dyn-copy center · axial · 3.5mm · 0.83mm/px · 1 of 22 slices shown (23 of 24)]
[im 1/22]
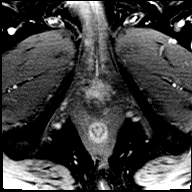

[Series 58: post t1_twist_tra_dyn-copy cent_sub_ttc=(id) · axial · 3.5mm · 0.83mm/px · 1 of 22 slices shown (22 of 22)]
[im 1/22]
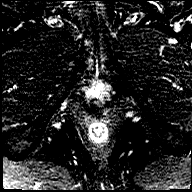

[Series 59: post t1_twist_tra_dyn-copy center · axial · 3.5mm · 0.83mm/px · 1 of 22 slices shown (24 of 24)]
[im 1/22]
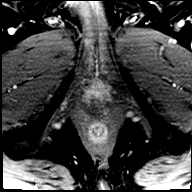

[56 of 56 positions shown; findings below may reference images not displayed]

FINDINGS: Prostate:

Region of interest # 1: PI-RADS category 3 lesion in the right
posterolateral peripheral zone of the mid gland with reduced ADC map
T2 signal characteristics but no appreciable focal early enhancement
beyond the background enhancement in the peripheral zone. This
lesion measures 1.2 by 0.2 by 0.5 cm (0.21 cubic cm).

Region of interest # 2: PI-RADS category 3 lesion in the left
posterolateral peripheral zone at the apex with low T2 activity but
no early enhancement. This lesion measures 0.9 by 0.6 by 0.6 cm
(0.25 cubic cm).

Volume: 3D volumetric analysis: Prostate volume 60.84 cubic cm (5.3
by 4.4 by 5.0 cm).

Transcapsular spread:  Absent

Seminal vesicle involvement: Absent

Neurovascular bundle involvement: Absent

Pelvic adenopathy: Absent

Bone metastasis: Absent

Other findings: No supplemental non-categorized findings.
IMPRESSION: 1. Two small PI-RADS category 3 lesions are observed as noted above.
Targeting data sent to [facility].
2. Prostate volume 60.84 cubic cm.

## 2019-04-12 MED ORDER — GADOBENATE DIMEGLUMINE 529 MG/ML IV SOLN
19.0000 mL | Freq: Once | INTRAVENOUS | Status: AC | PRN
  Administered 2019-04-12: 19 mL via INTRAVENOUS

## 2019-04-13 DIAGNOSIS — N5201 Erectile dysfunction due to arterial insufficiency: Secondary | ICD-10-CM | POA: Diagnosis not present

## 2019-04-13 DIAGNOSIS — C61 Malignant neoplasm of prostate: Secondary | ICD-10-CM | POA: Diagnosis not present

## 2019-04-13 DIAGNOSIS — N281 Cyst of kidney, acquired: Secondary | ICD-10-CM | POA: Diagnosis not present

## 2019-04-25 DIAGNOSIS — R079 Chest pain, unspecified: Secondary | ICD-10-CM | POA: Diagnosis not present

## 2019-05-25 ENCOUNTER — Other Ambulatory Visit: Payer: Self-pay | Admitting: Internal Medicine

## 2019-06-06 ENCOUNTER — Other Ambulatory Visit: Payer: Self-pay | Admitting: Internal Medicine

## 2019-06-14 DIAGNOSIS — E1159 Type 2 diabetes mellitus with other circulatory complications: Secondary | ICD-10-CM | POA: Diagnosis not present

## 2019-06-14 DIAGNOSIS — E1169 Type 2 diabetes mellitus with other specified complication: Secondary | ICD-10-CM | POA: Diagnosis not present

## 2019-06-14 DIAGNOSIS — I10 Essential (primary) hypertension: Secondary | ICD-10-CM | POA: Diagnosis not present

## 2019-06-14 DIAGNOSIS — E785 Hyperlipidemia, unspecified: Secondary | ICD-10-CM | POA: Diagnosis not present

## 2019-06-14 DIAGNOSIS — E119 Type 2 diabetes mellitus without complications: Secondary | ICD-10-CM | POA: Diagnosis not present

## 2019-06-14 DIAGNOSIS — Z794 Long term (current) use of insulin: Secondary | ICD-10-CM | POA: Diagnosis not present

## 2019-06-21 DIAGNOSIS — Z794 Long term (current) use of insulin: Secondary | ICD-10-CM | POA: Diagnosis not present

## 2019-06-21 DIAGNOSIS — E785 Hyperlipidemia, unspecified: Secondary | ICD-10-CM | POA: Diagnosis not present

## 2019-06-21 DIAGNOSIS — I10 Essential (primary) hypertension: Secondary | ICD-10-CM | POA: Diagnosis not present

## 2019-06-21 DIAGNOSIS — E119 Type 2 diabetes mellitus without complications: Secondary | ICD-10-CM | POA: Diagnosis not present

## 2019-06-21 DIAGNOSIS — E1159 Type 2 diabetes mellitus with other circulatory complications: Secondary | ICD-10-CM | POA: Diagnosis not present

## 2019-06-21 DIAGNOSIS — E1169 Type 2 diabetes mellitus with other specified complication: Secondary | ICD-10-CM | POA: Diagnosis not present

## 2019-06-27 DIAGNOSIS — E785 Hyperlipidemia, unspecified: Secondary | ICD-10-CM | POA: Diagnosis not present

## 2019-06-27 DIAGNOSIS — Z1159 Encounter for screening for other viral diseases: Secondary | ICD-10-CM | POA: Diagnosis not present

## 2019-06-27 DIAGNOSIS — Z0000 Encounter for general adult medical examination without abnormal findings: Secondary | ICD-10-CM | POA: Diagnosis not present

## 2019-06-27 DIAGNOSIS — E1169 Type 2 diabetes mellitus with other specified complication: Secondary | ICD-10-CM | POA: Diagnosis not present

## 2019-06-27 DIAGNOSIS — E1159 Type 2 diabetes mellitus with other circulatory complications: Secondary | ICD-10-CM | POA: Diagnosis not present

## 2019-06-27 DIAGNOSIS — I10 Essential (primary) hypertension: Secondary | ICD-10-CM | POA: Diagnosis not present

## 2019-07-07 DIAGNOSIS — L570 Actinic keratosis: Secondary | ICD-10-CM | POA: Diagnosis not present

## 2019-07-07 DIAGNOSIS — Z23 Encounter for immunization: Secondary | ICD-10-CM | POA: Diagnosis not present

## 2019-07-25 DIAGNOSIS — H401221 Low-tension glaucoma, left eye, mild stage: Secondary | ICD-10-CM | POA: Diagnosis not present

## 2019-08-24 DIAGNOSIS — H401221 Low-tension glaucoma, left eye, mild stage: Secondary | ICD-10-CM | POA: Diagnosis not present

## 2019-10-04 DIAGNOSIS — C61 Malignant neoplasm of prostate: Secondary | ICD-10-CM | POA: Diagnosis not present

## 2019-10-10 DIAGNOSIS — I208 Other forms of angina pectoris: Secondary | ICD-10-CM | POA: Diagnosis not present

## 2019-10-10 DIAGNOSIS — E785 Hyperlipidemia, unspecified: Secondary | ICD-10-CM | POA: Diagnosis not present

## 2019-10-10 DIAGNOSIS — I10 Essential (primary) hypertension: Secondary | ICD-10-CM | POA: Diagnosis not present

## 2019-10-10 DIAGNOSIS — I2510 Atherosclerotic heart disease of native coronary artery without angina pectoris: Secondary | ICD-10-CM | POA: Diagnosis not present

## 2019-10-10 DIAGNOSIS — R079 Chest pain, unspecified: Secondary | ICD-10-CM | POA: Diagnosis not present

## 2019-10-10 DIAGNOSIS — E1169 Type 2 diabetes mellitus with other specified complication: Secondary | ICD-10-CM | POA: Diagnosis not present

## 2019-10-10 DIAGNOSIS — E1159 Type 2 diabetes mellitus with other circulatory complications: Secondary | ICD-10-CM | POA: Diagnosis not present

## 2019-10-10 DIAGNOSIS — E119 Type 2 diabetes mellitus without complications: Secondary | ICD-10-CM | POA: Diagnosis not present

## 2019-10-10 DIAGNOSIS — Z794 Long term (current) use of insulin: Secondary | ICD-10-CM | POA: Diagnosis not present

## 2019-10-11 DIAGNOSIS — N200 Calculus of kidney: Secondary | ICD-10-CM | POA: Diagnosis not present

## 2019-10-11 DIAGNOSIS — D2230 Melanocytic nevi of unspecified part of face: Secondary | ICD-10-CM | POA: Diagnosis not present

## 2019-10-11 DIAGNOSIS — Z23 Encounter for immunization: Secondary | ICD-10-CM | POA: Diagnosis not present

## 2019-10-11 DIAGNOSIS — L570 Actinic keratosis: Secondary | ICD-10-CM | POA: Diagnosis not present

## 2019-10-11 DIAGNOSIS — L578 Other skin changes due to chronic exposure to nonionizing radiation: Secondary | ICD-10-CM | POA: Diagnosis not present

## 2019-10-11 DIAGNOSIS — D225 Melanocytic nevi of trunk: Secondary | ICD-10-CM | POA: Diagnosis not present

## 2019-10-11 DIAGNOSIS — Z85828 Personal history of other malignant neoplasm of skin: Secondary | ICD-10-CM | POA: Diagnosis not present

## 2019-10-11 DIAGNOSIS — D2271 Melanocytic nevi of right lower limb, including hip: Secondary | ICD-10-CM | POA: Diagnosis not present

## 2019-10-11 DIAGNOSIS — C61 Malignant neoplasm of prostate: Secondary | ICD-10-CM | POA: Diagnosis not present

## 2019-12-15 DIAGNOSIS — E1169 Type 2 diabetes mellitus with other specified complication: Secondary | ICD-10-CM | POA: Diagnosis not present

## 2019-12-15 DIAGNOSIS — Z1159 Encounter for screening for other viral diseases: Secondary | ICD-10-CM | POA: Diagnosis not present

## 2019-12-15 DIAGNOSIS — E785 Hyperlipidemia, unspecified: Secondary | ICD-10-CM | POA: Diagnosis not present

## 2019-12-15 DIAGNOSIS — Z794 Long term (current) use of insulin: Secondary | ICD-10-CM | POA: Diagnosis not present

## 2019-12-15 DIAGNOSIS — E119 Type 2 diabetes mellitus without complications: Secondary | ICD-10-CM | POA: Diagnosis not present

## 2019-12-22 DIAGNOSIS — E1159 Type 2 diabetes mellitus with other circulatory complications: Secondary | ICD-10-CM | POA: Diagnosis not present

## 2019-12-22 DIAGNOSIS — E785 Hyperlipidemia, unspecified: Secondary | ICD-10-CM | POA: Diagnosis not present

## 2019-12-22 DIAGNOSIS — I152 Hypertension secondary to endocrine disorders: Secondary | ICD-10-CM | POA: Diagnosis not present

## 2019-12-22 DIAGNOSIS — I2510 Atherosclerotic heart disease of native coronary artery without angina pectoris: Secondary | ICD-10-CM | POA: Diagnosis not present

## 2019-12-22 DIAGNOSIS — E119 Type 2 diabetes mellitus without complications: Secondary | ICD-10-CM | POA: Diagnosis not present

## 2019-12-22 DIAGNOSIS — C61 Malignant neoplasm of prostate: Secondary | ICD-10-CM | POA: Diagnosis not present

## 2019-12-22 DIAGNOSIS — Z0000 Encounter for general adult medical examination without abnormal findings: Secondary | ICD-10-CM | POA: Diagnosis not present

## 2019-12-22 DIAGNOSIS — E1169 Type 2 diabetes mellitus with other specified complication: Secondary | ICD-10-CM | POA: Diagnosis not present

## 2019-12-22 DIAGNOSIS — Z794 Long term (current) use of insulin: Secondary | ICD-10-CM | POA: Diagnosis not present

## 2020-01-06 DIAGNOSIS — H401221 Low-tension glaucoma, left eye, mild stage: Secondary | ICD-10-CM | POA: Diagnosis not present

## 2020-01-06 DIAGNOSIS — E119 Type 2 diabetes mellitus without complications: Secondary | ICD-10-CM | POA: Diagnosis not present

## 2020-01-06 DIAGNOSIS — H2511 Age-related nuclear cataract, right eye: Secondary | ICD-10-CM | POA: Diagnosis not present

## 2020-01-06 DIAGNOSIS — H26492 Other secondary cataract, left eye: Secondary | ICD-10-CM | POA: Diagnosis not present

## 2020-01-10 DIAGNOSIS — H26492 Other secondary cataract, left eye: Secondary | ICD-10-CM | POA: Diagnosis not present

## 2020-04-17 DIAGNOSIS — E785 Hyperlipidemia, unspecified: Secondary | ICD-10-CM | POA: Diagnosis not present

## 2020-04-17 DIAGNOSIS — E782 Mixed hyperlipidemia: Secondary | ICD-10-CM | POA: Diagnosis not present

## 2020-04-17 DIAGNOSIS — I10 Essential (primary) hypertension: Secondary | ICD-10-CM | POA: Diagnosis not present

## 2020-04-17 DIAGNOSIS — I2510 Atherosclerotic heart disease of native coronary artery without angina pectoris: Secondary | ICD-10-CM | POA: Diagnosis not present

## 2020-04-17 DIAGNOSIS — E1169 Type 2 diabetes mellitus with other specified complication: Secondary | ICD-10-CM | POA: Diagnosis not present

## 2020-04-17 DIAGNOSIS — C61 Malignant neoplasm of prostate: Secondary | ICD-10-CM | POA: Diagnosis not present

## 2020-04-17 DIAGNOSIS — R42 Dizziness and giddiness: Secondary | ICD-10-CM | POA: Diagnosis not present

## 2020-04-26 DIAGNOSIS — C61 Malignant neoplasm of prostate: Secondary | ICD-10-CM | POA: Diagnosis not present

## 2020-04-30 DIAGNOSIS — C61 Malignant neoplasm of prostate: Secondary | ICD-10-CM | POA: Diagnosis not present

## 2020-04-30 DIAGNOSIS — R300 Dysuria: Secondary | ICD-10-CM | POA: Diagnosis not present

## 2020-04-30 DIAGNOSIS — N200 Calculus of kidney: Secondary | ICD-10-CM | POA: Diagnosis not present

## 2020-04-30 DIAGNOSIS — D49512 Neoplasm of unspecified behavior of left kidney: Secondary | ICD-10-CM | POA: Diagnosis not present

## 2020-05-14 ENCOUNTER — Other Ambulatory Visit: Payer: Self-pay | Admitting: Urology

## 2020-05-14 DIAGNOSIS — C61 Malignant neoplasm of prostate: Secondary | ICD-10-CM

## 2020-05-18 DIAGNOSIS — L570 Actinic keratosis: Secondary | ICD-10-CM | POA: Diagnosis not present

## 2020-06-04 ENCOUNTER — Ambulatory Visit
Admission: RE | Admit: 2020-06-04 | Discharge: 2020-06-04 | Disposition: A | Discharge status: Home / Self Care | Payer: PPO | Source: Ambulatory Visit | Attending: Urology | Admitting: Urology

## 2020-06-04 ENCOUNTER — Other Ambulatory Visit: Payer: Self-pay

## 2020-06-04 DIAGNOSIS — R590 Localized enlarged lymph nodes: Secondary | ICD-10-CM | POA: Diagnosis not present

## 2020-06-04 DIAGNOSIS — C61 Malignant neoplasm of prostate: Secondary | ICD-10-CM

## 2020-06-04 IMAGING — MR MR PROSTATE WO/W CM
12 series · 48 of 48 positions shown · IV contrast (multihance)
Comparison: [DATE]

CLINICAL DATA: Follow-up of prostate carcinoma, diagnosed 2 years
ago.

EXAM:
MR PROSTATE WITHOUT AND WITH CONTRAST
TECHNIQUE: Multiplanar multisequence MRI images were obtained of the pelvis
centered about the prostate. Pre and post contrast images were
obtained.
CONTRAST:  19mL MULTIHANCE GADOBENATE DIMEGLUMINE 529 MG/ML IV SOLN

[Series 3: T2 · coronal · 3.0mm · 0.56mm/px · 1 of 23 slices shown (1 of 3)]
[im 1/23]
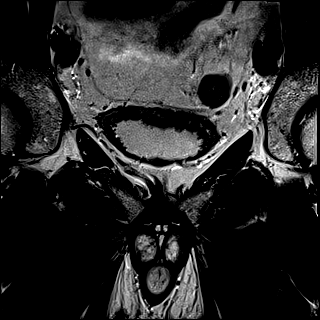

[Series 4: T1 · axial · 5.0mm · 1.25mm/px · 1 of 80 slices shown]
[im 1/80]
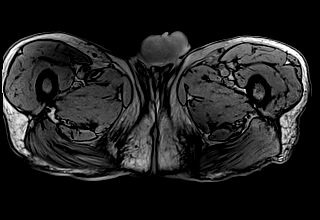

[Series 5: DWI · axial · 3.0mm · 1.75mm/px · 1 of 75 slices shown (1 of 3)]
[im 1/75]
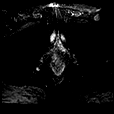

[Series 6: DWI · axial · 3.0mm · 1.75mm/px · 1 of 25 slices shown (2 of 3)]
[im 1/25]
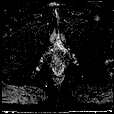

[Series 7: DWI · axial · 3.0mm · 1.75mm/px · 1 of 25 slices shown (3 of 3)]
[im 1/25]
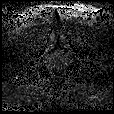

[Series 8: T2 · axial · 3.0mm · 0.56mm/px · 1 of 30 slices shown (2 of 3)]
[im 1/30]
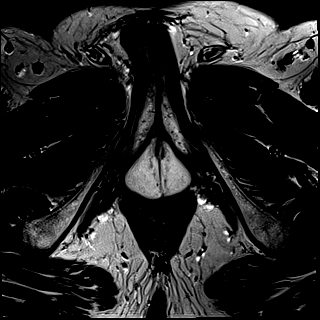

[Series 9: T2 · axial · 1.0mm · 1.04mm/px · z∈[-103,-24]mm · 2 of 80 slices shown (3 of 3)]
[im 1/80]
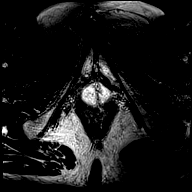
[im 80/80]
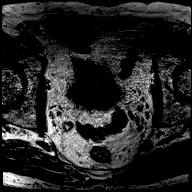

[Series 10: pre t1_twist_tra_dyn · axial · non-contrast · 3.5mm · 0.83mm/px · 1 of 26 slices shown]
[im 1/26]
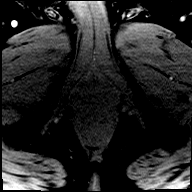

[Series 11: post t1_twist_tra_dyn-copy center · axial · non-contrast · 3.5mm · 0.83mm/px · z∈[-107,-20]mm · 18 of 780 slices shown]
[im 1/780]
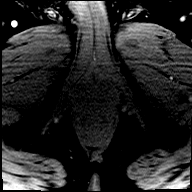
[im 46/780]
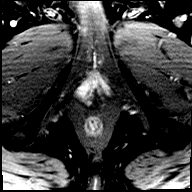
[im 92/780]
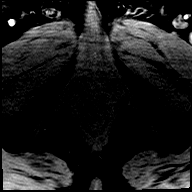
[im 138/780]
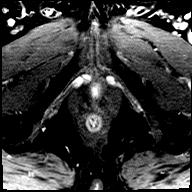
[im 184/780]
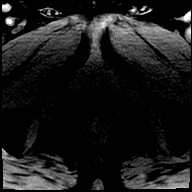
[im 230/780]
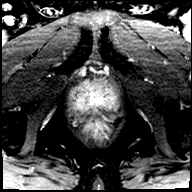
[im 275/780]
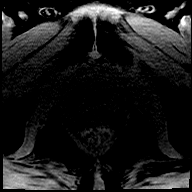
[im 321/780]
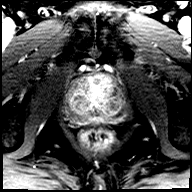
[im 367/780]
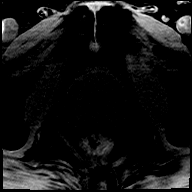
[im 413/780]
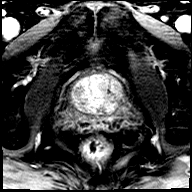
[im 459/780]
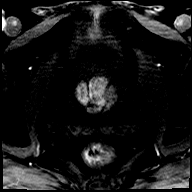
[im 505/780]
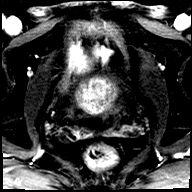
[im 550/780]
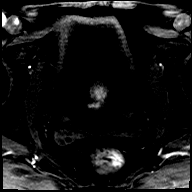
[im 596/780]
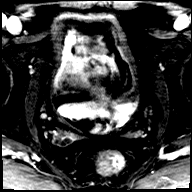
[im 642/780]
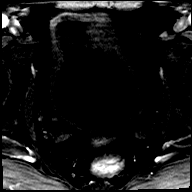
[im 688/780]
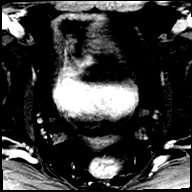
[im 734/780]
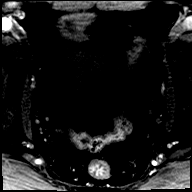
[im 780/780]
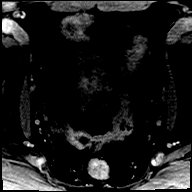

[Series 12: post t1_twist_tra_dyn-copy cent_sub · axial · 3.5mm · 0.83mm/px · z∈[-107,-20]mm · 17 of 735 slices shown]
[im 1/735]
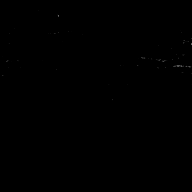
[im 46/735]
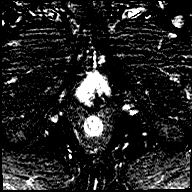
[im 92/735]
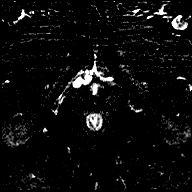
[im 138/735]
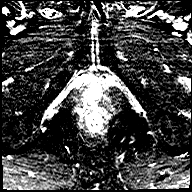
[im 184/735]
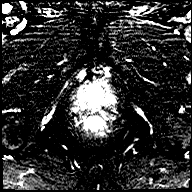
[im 230/735]
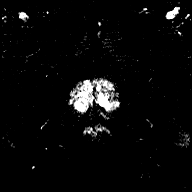
[im 276/735]
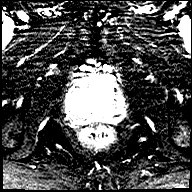
[im 322/735]
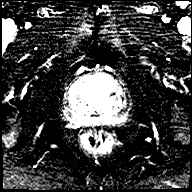
[im 368/735]
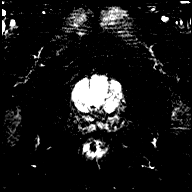
[im 413/735]
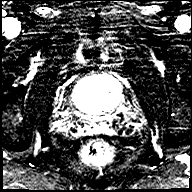
[im 459/735]
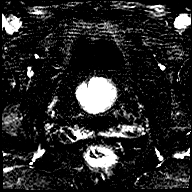
[im 505/735]
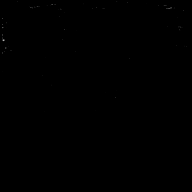
[im 551/735]
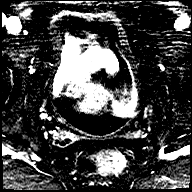
[im 597/735]
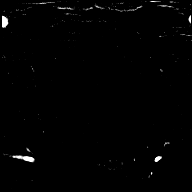
[im 643/735]
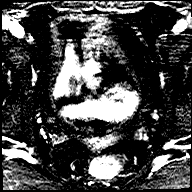
[im 689/735]
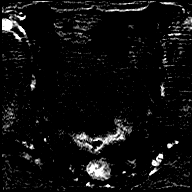
[im 735/735]
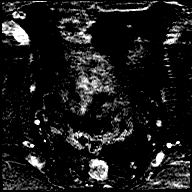

[Series 13: t1_vibe_dixon_tra_f · axial · 2.5mm · 0.91mm/px · z∈[-152,+46]mm · 2 of 80 slices shown]
[im 1/80]
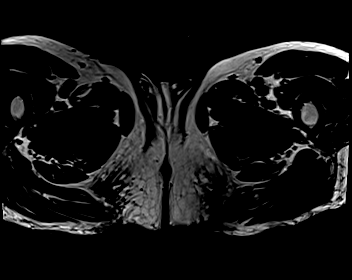
[im 80/80]
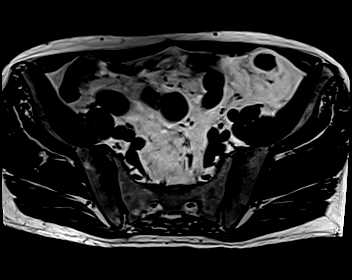

[Series 14: t1_vibe_dixon_tra_w · axial · 2.5mm · 0.91mm/px · z∈[-152,+46]mm · 2 of 80 slices shown]
[im 1/80]
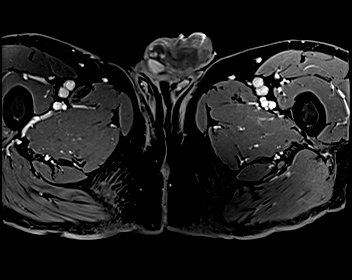
[im 80/80]
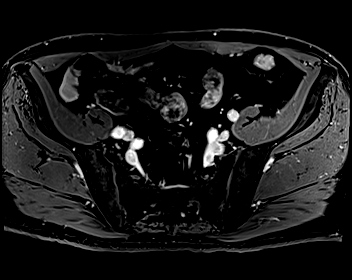

[48 of 48 positions shown; findings below may reference images not displayed]

FINDINGS: Prostate: Moderate central gland enlargement and heterogeneity,
consistent with benign prostatic hyperplasia. No suspicious central
gland nodule.

The area of vague T2 hyperintensity involving the lateral right mid
gland peripheral zone including on [DATE] is similar. This is without
correlate restricted diffusion or early post-contrast
hyperenhancement.

Similarly, the left lateral apical peripheral zone area of T2
hypointensity is similar at 11 mm on [DATE]. No correlate restricted
diffusion, decreased signal on ADC map, or early post-contrast
enhancement.

Volume: 4.8 x 4.7 x 5.1 cm (volume = 60 cm^3)

Transcapsular spread:  Absent

Seminal vesicle involvement: Absent

Neurovascular bundle involvement: Absent

Pelvic adenopathy: Absent

Bone metastasis: Absent

Other findings: No significant free fluid.  Normal urinary bladder.
IMPRESSION: 1. The areas of bilateral peripheral zone T2 hypointensity described
on the prior MRI are similar on T2 weighted imaging. Given absence
of correlate restricted diffusion or early post-contrast
enhancement, these are considered PI-RADS(v2.1)-2. No findings of
macroscopic or high-grade prostate carcinoma.
2. No pelvic adenopathy.

## 2020-06-04 MED ORDER — GADOBENATE DIMEGLUMINE 529 MG/ML IV SOLN
19.0000 mL | Freq: Once | INTRAVENOUS | Status: AC | PRN
  Administered 2020-06-04: 19 mL via INTRAVENOUS

## 2020-06-20 DIAGNOSIS — C61 Malignant neoplasm of prostate: Secondary | ICD-10-CM | POA: Diagnosis not present

## 2020-06-25 ENCOUNTER — Other Ambulatory Visit (HOSPITAL_COMMUNITY): Payer: Self-pay | Admitting: Urology

## 2020-06-25 DIAGNOSIS — D49512 Neoplasm of unspecified behavior of left kidney: Secondary | ICD-10-CM

## 2020-06-26 DIAGNOSIS — E785 Hyperlipidemia, unspecified: Secondary | ICD-10-CM | POA: Diagnosis not present

## 2020-06-26 DIAGNOSIS — E1159 Type 2 diabetes mellitus with other circulatory complications: Secondary | ICD-10-CM | POA: Diagnosis not present

## 2020-06-26 DIAGNOSIS — I152 Hypertension secondary to endocrine disorders: Secondary | ICD-10-CM | POA: Diagnosis not present

## 2020-06-26 DIAGNOSIS — E1169 Type 2 diabetes mellitus with other specified complication: Secondary | ICD-10-CM | POA: Diagnosis not present

## 2020-06-26 DIAGNOSIS — Z794 Long term (current) use of insulin: Secondary | ICD-10-CM | POA: Diagnosis not present

## 2020-07-05 ENCOUNTER — Ambulatory Visit (HOSPITAL_COMMUNITY): Payer: PPO

## 2020-07-05 DIAGNOSIS — E1169 Type 2 diabetes mellitus with other specified complication: Secondary | ICD-10-CM | POA: Diagnosis not present

## 2020-07-05 DIAGNOSIS — I152 Hypertension secondary to endocrine disorders: Secondary | ICD-10-CM | POA: Diagnosis not present

## 2020-07-05 DIAGNOSIS — Z0000 Encounter for general adult medical examination without abnormal findings: Secondary | ICD-10-CM | POA: Diagnosis not present

## 2020-07-05 DIAGNOSIS — E1159 Type 2 diabetes mellitus with other circulatory complications: Secondary | ICD-10-CM | POA: Diagnosis not present

## 2020-07-05 DIAGNOSIS — E119 Type 2 diabetes mellitus without complications: Secondary | ICD-10-CM | POA: Diagnosis not present

## 2020-07-05 DIAGNOSIS — E785 Hyperlipidemia, unspecified: Secondary | ICD-10-CM | POA: Diagnosis not present

## 2020-07-05 DIAGNOSIS — Z794 Long term (current) use of insulin: Secondary | ICD-10-CM | POA: Diagnosis not present

## 2020-07-06 ENCOUNTER — Other Ambulatory Visit: Payer: Self-pay

## 2020-07-06 ENCOUNTER — Ambulatory Visit (HOSPITAL_COMMUNITY)
Admission: RE | Admit: 2020-07-06 | Discharge: 2020-07-06 | Disposition: A | Discharge status: Home / Self Care | Payer: PPO | Source: Ambulatory Visit | Attending: Urology | Admitting: Urology

## 2020-07-06 DIAGNOSIS — N281 Cyst of kidney, acquired: Secondary | ICD-10-CM | POA: Diagnosis not present

## 2020-07-06 DIAGNOSIS — K862 Cyst of pancreas: Secondary | ICD-10-CM | POA: Diagnosis not present

## 2020-07-06 DIAGNOSIS — D49512 Neoplasm of unspecified behavior of left kidney: Secondary | ICD-10-CM | POA: Diagnosis not present

## 2020-07-06 IMAGING — MR MR ABDOMEN WO/W CM
18 series · 48 of 48 positions shown · IV contrast (gadavist)
Comparison: CT evaluation from [DATE]

CLINICAL DATA: Abnormal CT with slight interval enlargement of
lesion arising from the inferior LEFT kidney, follow-up evaluation.

EXAM:
MRI ABDOMEN WITHOUT AND WITH CONTRAST
TECHNIQUE: Multiplanar multisequence MR imaging of the abdomen was performed
both before and after the administration of intravenous contrast.
CONTRAST:  10mL GADAVIST GADOBUTROL 1 MMOL/ML IV SOLN

[Series 3: T2 · coronal · 6.0mm · 1.56mm/px · 2 of 38 slices shown (1 of 2)]
[im 1/38]
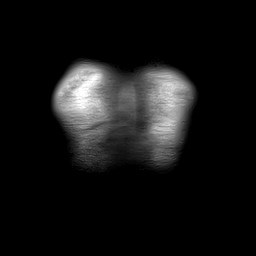
[im 38/38]
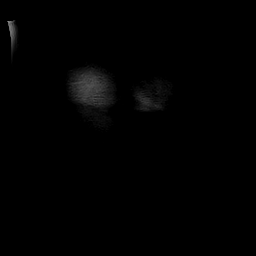

[Series 5: T2 fat-sat · axial · 6.0mm · 1.25mm/px · z∈[-162,+148]mm · 2 of 44 slices shown]
[im 1/44]
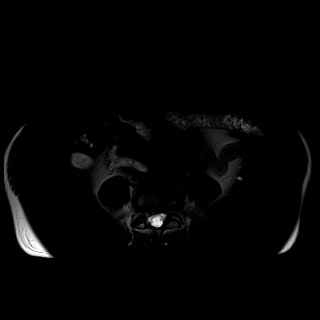
[im 44/44]
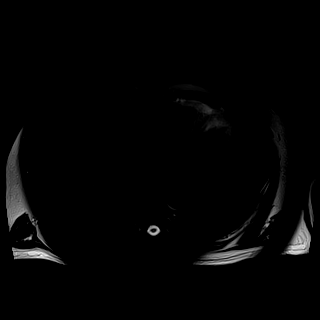

[Series 6: T1 · axial · 3.4mm · 1.25mm/px · z∈[-163,+133]mm · 3 of 88 slices shown (1 of 2)]
[im 1/88]
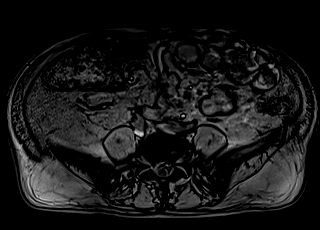
[im 44/88]
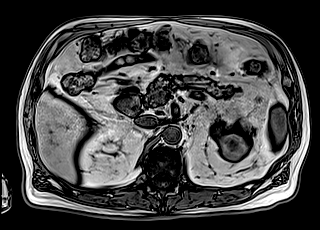
[im 88/88]
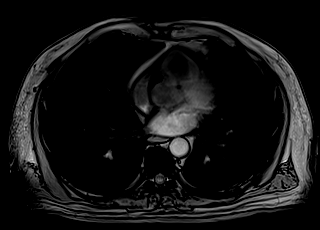

[Series 7: T1 · axial · 3.4mm · 1.25mm/px · z∈[-163,+133]mm · 3 of 88 slices shown (2 of 2)]
[im 1/88]
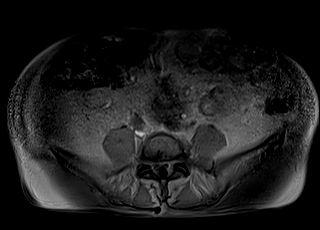
[im 44/88]
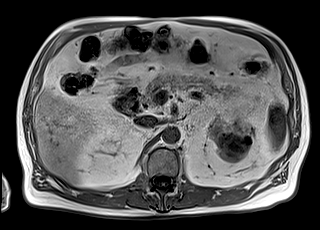
[im 88/88]
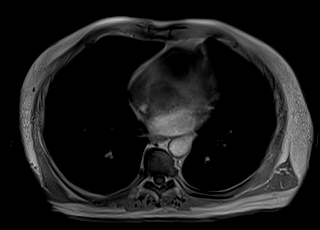

[Series 8: DWI · axial · 6.0mm · 1.49mm/px · z∈[-179,+152]mm · 3 of 94 slices shown (1 of 2)]
[im 1/94]
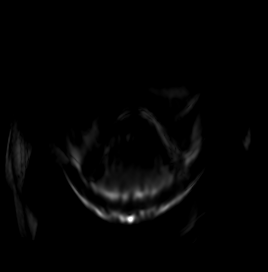
[im 47/94]
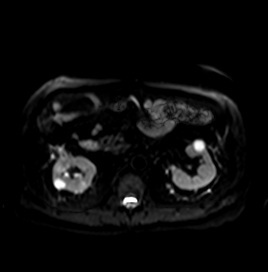
[im 94/94]
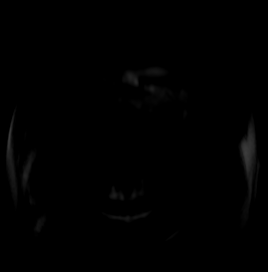

[Series 9: DWI · axial · 6.0mm · 1.49mm/px · z∈[-179,+152]mm · 2 of 47 slices shown (2 of 2)]
[im 1/47]
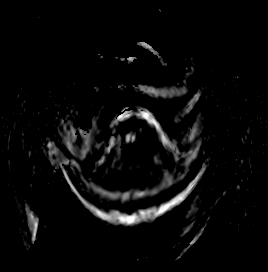
[im 47/47]
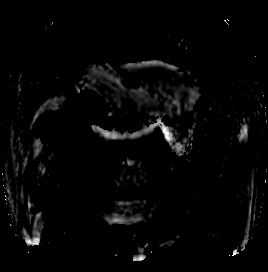

[Series 10: bSSFP · axial · 5.0mm · 0.84mm/px · z∈[-168,+146]mm · 2 of 58 slices shown]
[im 1/58]
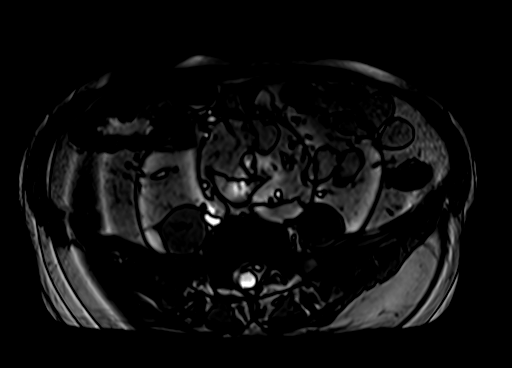
[im 58/58]
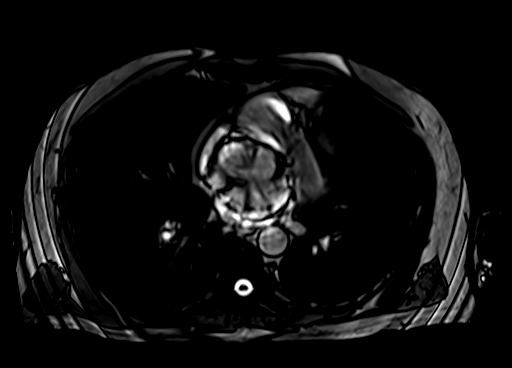

[Series 12: T1 dynamic · axial · 3.0mm · 1.33mm/px · z∈[-165,+144]mm · 3 of 104 slices shown (1 of 6)]
[im 1/104]
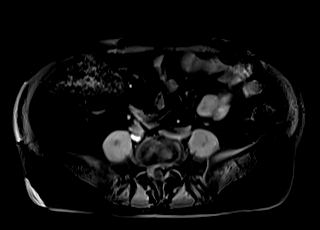
[im 52/104]
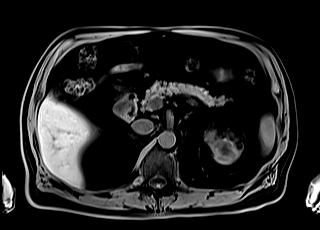
[im 104/104]
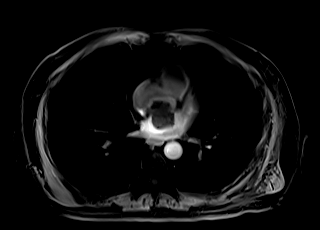

[Series 15: T1 dynamic · axial · 3.0mm · 1.33mm/px · z∈[-165,+144]mm · 3 of 104 slices shown (2 of 6)]
[im 1/104]
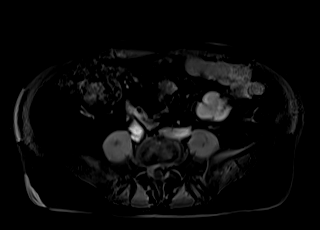
[im 52/104]
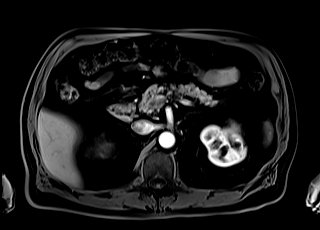
[im 104/104]
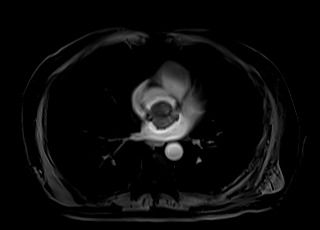

[Series 17: T1 dynamic · axial · 3.0mm · 1.33mm/px · z∈[-165,+144]mm · 3 of 104 slices shown (3 of 6)]
[im 1/104]
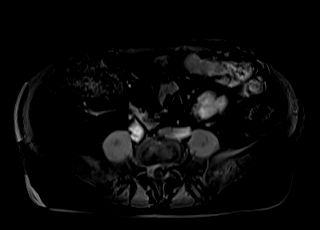
[im 52/104]
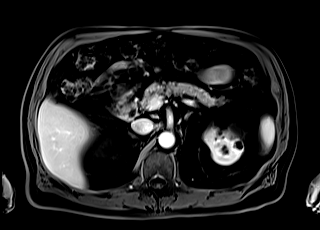
[im 104/104]
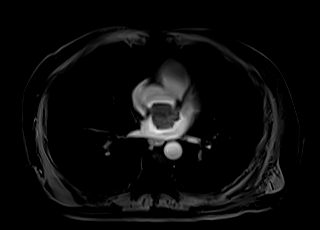

[Series 19: T1 dynamic · axial · 3.0mm · 1.33mm/px · z∈[-165,+144]mm · 3 of 104 slices shown (4 of 6)]
[im 1/104]
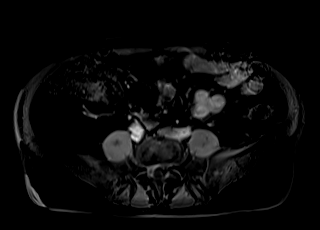
[im 52/104]
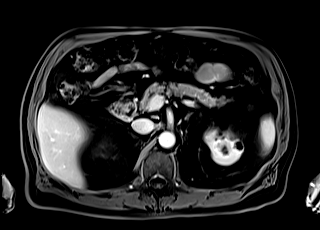
[im 104/104]
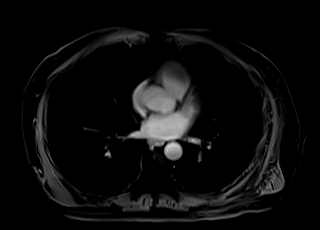

[Series 21: T1 dynamic · coronal · 5.0mm · 1.41mm/px · 2 of 56 slices shown (5 of 6)]
[im 1/56]
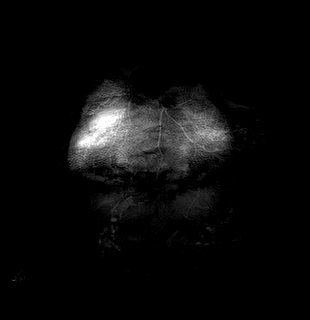
[im 56/56]
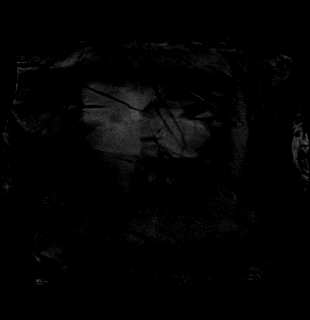

[Series 22: T2 · axial · 6.0mm · 1.56mm/px · z∈[-207,+124]mm · 2 of 47 slices shown (2 of 2)]
[im 1/47]
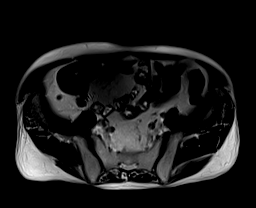
[im 47/47]
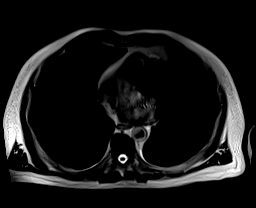

[Series 24: T1 dynamic · axial · 3.0mm · 1.33mm/px · z∈[-165,+144]mm · 3 of 104 slices shown (6 of 6)]
[im 1/104]
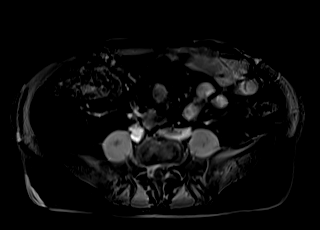
[im 52/104]
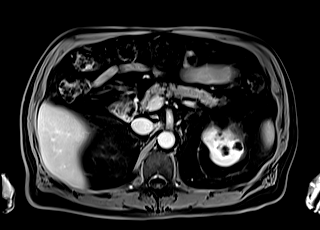
[im 104/104]
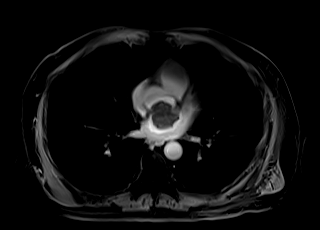

[Series 100: sub_20 sec · axial · 3.0mm · 1.33mm/px · z∈[-165,+144]mm · 3 of 104 slices shown]
[im 1/104]
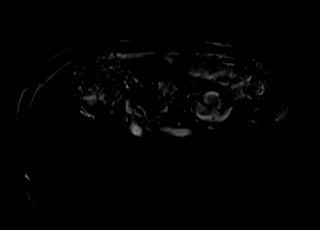
[im 52/104]
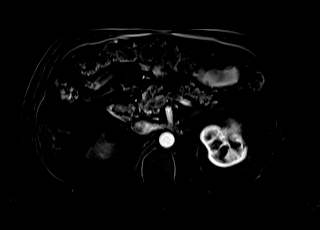
[im 104/104]
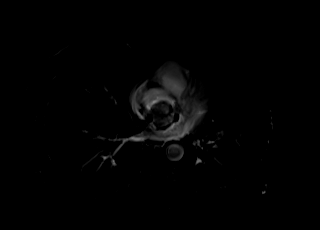

[Series 101: sub_45 sec · axial · 3.0mm · 1.33mm/px · z∈[-165,+144]mm · 3 of 104 slices shown]
[im 1/104]
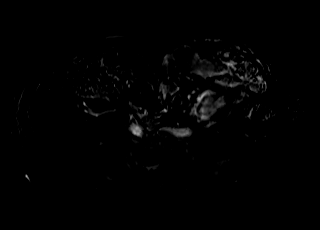
[im 52/104]
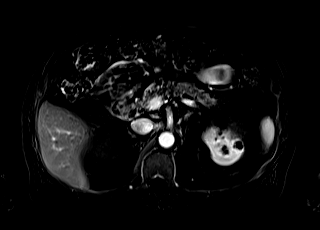
[im 104/104]
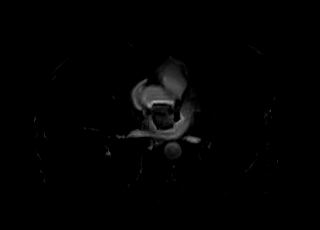

[Series 102: sub_90 sec · axial · 3.0mm · 1.33mm/px · z∈[-165,+144]mm · 3 of 104 slices shown]
[im 1/104]
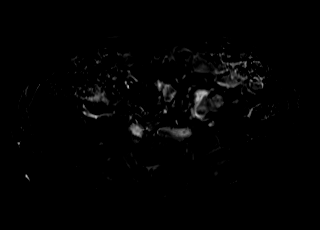
[im 52/104]
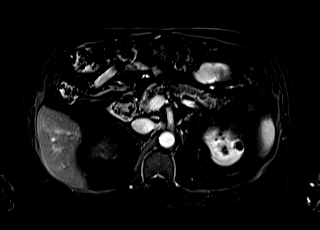
[im 104/104]
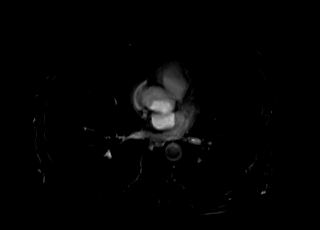

[Series 103: sub_delay · axial · 3.0mm · 1.33mm/px · z∈[-99,+144]mm · 3 of 82 slices shown]
[im 1/82]
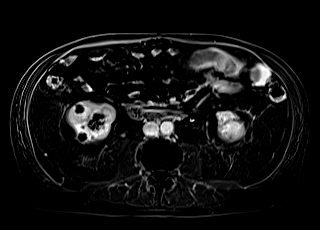
[im 41/82]
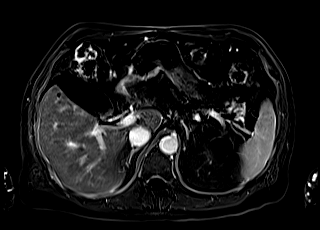
[im 82/82]
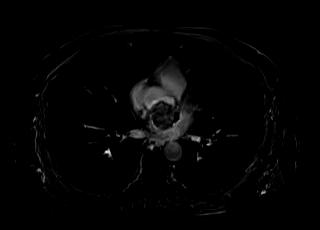

[48 of 48 positions shown; findings below may reference images not displayed]

FINDINGS: Lower chest: Incidental imaging of the lung bases without
consolidation or sign of pleural effusion.

Hepatobiliary: No focal, suspicious hepatic lesion. Portal vein is
patent. Hepatic veins are patent. No pericholecystic stranding. No
biliary duct dilation.

Pancreas: Normal intrinsic T1 signal in the pancreas. No sign of
peripancreatic stranding or ductal dilation. 6 mm cystic focus in
the head of the pancreas without main duct dilation. (Image 26 of
series 5) suggestion of pancreatic divisum versus dominant dorsal
drainage of the main pancreatic duct into the minor papilla. No
other tiny cystic focus in the tail of the pancreas measuring 3 mm.

Spleen:  Normal spleen.  No focal lesion or splenic enlargement.

Adrenals/Urinary Tract: Adrenal glands are normal. Bilateral renal
cysts, Bosniak category I, no hydronephrosis. Largest cyst on the
LEFT in the upper pole measures 2 cm. Largest cyst on the RIGHT in
the upper pole measures 1.8 cm. Area of concern in the lower pole
the LEFT kidney may have had mildly hemorrhagic or proteinaceous
features previously showing low signal at the periphery of the lower
pole cyst that is often indicative of prior hemorrhage but shows no
suspicious features.

Stomach/Bowel: Limited assessment of the gastrointestinal tract
without acute process.

Vascular/Lymphatic: Abdominal vasculature is patent. No abdominal
lymphadenopathy

Other:  No ascites

Musculoskeletal: No suspicious bone lesions identified.
IMPRESSION: 1. Simple cysts of the bilateral kidneys. No suspicious renal
lesion. Area of concern may have had prior hemorrhage but is
currently in keeping with a Bosniak category I cyst of the LEFT
kidney.
2. Tiny cystic lesion in the head/uncinate process of the pancreas
and another in the tail of the pancreas. Two year follow-up with
MRI/MRCP is suggested.
3. Question of pancreatic divisum or variant pancreatic ductal
anatomy. No signs of pancreatic atrophy or other stigmata of
pancreatitis. This could be evaluated further on follow-up as
warranted the could be assessed at the time of pancreatic follow-up.

These results will be called to the ordering clinician or
representative by the Radiologist Assistant, and communication
documented in the PACS or [REDACTED].

## 2020-07-06 MED ORDER — GADOBUTROL 1 MMOL/ML IV SOLN
10.0000 mL | Freq: Once | INTRAVENOUS | Status: AC | PRN
  Administered 2020-07-06: 10 mL via INTRAVENOUS

## 2020-07-13 DIAGNOSIS — N281 Cyst of kidney, acquired: Secondary | ICD-10-CM | POA: Diagnosis not present

## 2020-07-13 DIAGNOSIS — C61 Malignant neoplasm of prostate: Secondary | ICD-10-CM | POA: Diagnosis not present

## 2020-07-13 DIAGNOSIS — H401221 Low-tension glaucoma, left eye, mild stage: Secondary | ICD-10-CM | POA: Diagnosis not present

## 2020-08-02 DIAGNOSIS — R300 Dysuria: Secondary | ICD-10-CM | POA: Diagnosis not present

## 2020-08-02 DIAGNOSIS — J069 Acute upper respiratory infection, unspecified: Secondary | ICD-10-CM | POA: Diagnosis not present

## 2020-08-02 DIAGNOSIS — Z20828 Contact with and (suspected) exposure to other viral communicable diseases: Secondary | ICD-10-CM | POA: Diagnosis not present

## 2020-08-13 ENCOUNTER — Emergency Department: Payer: HMO

## 2020-08-13 ENCOUNTER — Encounter: Payer: Self-pay | Admitting: Emergency Medicine

## 2020-08-13 ENCOUNTER — Other Ambulatory Visit: Payer: Self-pay

## 2020-08-13 ENCOUNTER — Inpatient Hospital Stay: Payer: HMO

## 2020-08-13 ENCOUNTER — Inpatient Hospital Stay
Admission: EM | Admit: 2020-08-13 | Discharge: 2020-09-04 | DRG: 870 | Disposition: E | Payer: HMO | Attending: Internal Medicine | Admitting: Internal Medicine

## 2020-08-13 DIAGNOSIS — E872 Acidosis, unspecified: Secondary | ICD-10-CM

## 2020-08-13 DIAGNOSIS — E1111 Type 2 diabetes mellitus with ketoacidosis with coma: Secondary | ICD-10-CM | POA: Diagnosis present

## 2020-08-13 DIAGNOSIS — G931 Anoxic brain damage, not elsewhere classified: Secondary | ICD-10-CM | POA: Diagnosis present

## 2020-08-13 DIAGNOSIS — R6521 Severe sepsis with septic shock: Secondary | ICD-10-CM | POA: Diagnosis present

## 2020-08-13 DIAGNOSIS — Z96652 Presence of left artificial knee joint: Secondary | ICD-10-CM | POA: Diagnosis present

## 2020-08-13 DIAGNOSIS — J156 Pneumonia due to other aerobic Gram-negative bacteria: Secondary | ICD-10-CM | POA: Diagnosis present

## 2020-08-13 DIAGNOSIS — Z978 Presence of other specified devices: Secondary | ICD-10-CM

## 2020-08-13 DIAGNOSIS — J1282 Pneumonia due to coronavirus disease 2019: Secondary | ICD-10-CM | POA: Diagnosis present

## 2020-08-13 DIAGNOSIS — Z0189 Encounter for other specified special examinations: Secondary | ICD-10-CM

## 2020-08-13 DIAGNOSIS — I2510 Atherosclerotic heart disease of native coronary artery without angina pectoris: Secondary | ICD-10-CM | POA: Diagnosis present

## 2020-08-13 DIAGNOSIS — I4891 Unspecified atrial fibrillation: Secondary | ICD-10-CM | POA: Diagnosis present

## 2020-08-13 DIAGNOSIS — H409 Unspecified glaucoma: Secondary | ICD-10-CM | POA: Diagnosis present

## 2020-08-13 DIAGNOSIS — E854 Organ-limited amyloidosis: Secondary | ICD-10-CM | POA: Diagnosis present

## 2020-08-13 DIAGNOSIS — G9340 Encephalopathy, unspecified: Secondary | ICD-10-CM | POA: Diagnosis not present

## 2020-08-13 DIAGNOSIS — Z87891 Personal history of nicotine dependence: Secondary | ICD-10-CM

## 2020-08-13 DIAGNOSIS — N2581 Secondary hyperparathyroidism of renal origin: Secondary | ICD-10-CM | POA: Diagnosis present

## 2020-08-13 DIAGNOSIS — Z885 Allergy status to narcotic agent status: Secondary | ICD-10-CM

## 2020-08-13 DIAGNOSIS — Z7984 Long term (current) use of oral hypoglycemic drugs: Secondary | ICD-10-CM

## 2020-08-13 DIAGNOSIS — E0811 Diabetes mellitus due to underlying condition with ketoacidosis with coma: Secondary | ICD-10-CM | POA: Diagnosis not present

## 2020-08-13 DIAGNOSIS — G928 Other toxic encephalopathy: Secondary | ICD-10-CM | POA: Diagnosis present

## 2020-08-13 DIAGNOSIS — R710 Precipitous drop in hematocrit: Secondary | ICD-10-CM | POA: Diagnosis present

## 2020-08-13 DIAGNOSIS — Z452 Encounter for adjustment and management of vascular access device: Secondary | ICD-10-CM

## 2020-08-13 DIAGNOSIS — I5021 Acute systolic (congestive) heart failure: Secondary | ICD-10-CM | POA: Diagnosis present

## 2020-08-13 DIAGNOSIS — Z794 Long term (current) use of insulin: Secondary | ICD-10-CM

## 2020-08-13 DIAGNOSIS — R4182 Altered mental status, unspecified: Secondary | ICD-10-CM | POA: Diagnosis not present

## 2020-08-13 DIAGNOSIS — Z515 Encounter for palliative care: Secondary | ICD-10-CM

## 2020-08-13 DIAGNOSIS — Z66 Do not resuscitate: Secondary | ICD-10-CM | POA: Diagnosis not present

## 2020-08-13 DIAGNOSIS — K529 Noninfective gastroenteritis and colitis, unspecified: Secondary | ICD-10-CM

## 2020-08-13 DIAGNOSIS — I110 Hypertensive heart disease with heart failure: Secondary | ICD-10-CM | POA: Diagnosis present

## 2020-08-13 DIAGNOSIS — N5089 Other specified disorders of the male genital organs: Secondary | ICD-10-CM | POA: Diagnosis present

## 2020-08-13 DIAGNOSIS — L89156 Pressure-induced deep tissue damage of sacral region: Secondary | ICD-10-CM | POA: Diagnosis not present

## 2020-08-13 DIAGNOSIS — I255 Ischemic cardiomyopathy: Secondary | ICD-10-CM | POA: Diagnosis present

## 2020-08-13 DIAGNOSIS — Z8249 Family history of ischemic heart disease and other diseases of the circulatory system: Secondary | ICD-10-CM

## 2020-08-13 DIAGNOSIS — Z888 Allergy status to other drugs, medicaments and biological substances status: Secondary | ICD-10-CM

## 2020-08-13 DIAGNOSIS — E785 Hyperlipidemia, unspecified: Secondary | ICD-10-CM | POA: Diagnosis present

## 2020-08-13 DIAGNOSIS — I214 Non-ST elevation (NSTEMI) myocardial infarction: Secondary | ICD-10-CM | POA: Diagnosis present

## 2020-08-13 DIAGNOSIS — Z79899 Other long term (current) drug therapy: Secondary | ICD-10-CM

## 2020-08-13 DIAGNOSIS — R0602 Shortness of breath: Secondary | ICD-10-CM

## 2020-08-13 DIAGNOSIS — J9601 Acute respiratory failure with hypoxia: Secondary | ICD-10-CM | POA: Diagnosis present

## 2020-08-13 DIAGNOSIS — R7989 Other specified abnormal findings of blood chemistry: Secondary | ICD-10-CM | POA: Diagnosis present

## 2020-08-13 DIAGNOSIS — I680 Cerebral amyloid angiopathy: Secondary | ICD-10-CM | POA: Diagnosis present

## 2020-08-13 DIAGNOSIS — N390 Urinary tract infection, site not specified: Secondary | ICD-10-CM | POA: Diagnosis present

## 2020-08-13 DIAGNOSIS — E440 Moderate protein-calorie malnutrition: Secondary | ICD-10-CM | POA: Diagnosis not present

## 2020-08-13 DIAGNOSIS — N170 Acute kidney failure with tubular necrosis: Secondary | ICD-10-CM | POA: Diagnosis not present

## 2020-08-13 DIAGNOSIS — I82409 Acute embolism and thrombosis of unspecified deep veins of unspecified lower extremity: Secondary | ICD-10-CM

## 2020-08-13 DIAGNOSIS — R9389 Abnormal findings on diagnostic imaging of other specified body structures: Secondary | ICD-10-CM

## 2020-08-13 DIAGNOSIS — R680 Hypothermia, not associated with low environmental temperature: Secondary | ICD-10-CM | POA: Diagnosis present

## 2020-08-13 DIAGNOSIS — E875 Hyperkalemia: Secondary | ICD-10-CM | POA: Diagnosis present

## 2020-08-13 DIAGNOSIS — Z7189 Other specified counseling: Secondary | ICD-10-CM | POA: Diagnosis not present

## 2020-08-13 DIAGNOSIS — Z6824 Body mass index (BMI) 24.0-24.9, adult: Secondary | ICD-10-CM

## 2020-08-13 DIAGNOSIS — E876 Hypokalemia: Secondary | ICD-10-CM | POA: Diagnosis not present

## 2020-08-13 DIAGNOSIS — N19 Unspecified kidney failure: Secondary | ICD-10-CM | POA: Diagnosis not present

## 2020-08-13 DIAGNOSIS — N179 Acute kidney failure, unspecified: Secondary | ICD-10-CM | POA: Diagnosis present

## 2020-08-13 DIAGNOSIS — Z85828 Personal history of other malignant neoplasm of skin: Secondary | ICD-10-CM

## 2020-08-13 DIAGNOSIS — A0839 Other viral enteritis: Secondary | ICD-10-CM | POA: Diagnosis present

## 2020-08-13 DIAGNOSIS — J9602 Acute respiratory failure with hypercapnia: Secondary | ICD-10-CM | POA: Diagnosis present

## 2020-08-13 DIAGNOSIS — U071 COVID-19: Secondary | ICD-10-CM | POA: Diagnosis present

## 2020-08-13 DIAGNOSIS — A419 Sepsis, unspecified organism: Principal | ICD-10-CM | POA: Diagnosis present

## 2020-08-13 DIAGNOSIS — E11649 Type 2 diabetes mellitus with hypoglycemia without coma: Secondary | ICD-10-CM | POA: Diagnosis not present

## 2020-08-13 DIAGNOSIS — Z882 Allergy status to sulfonamides status: Secondary | ICD-10-CM

## 2020-08-13 DIAGNOSIS — R600 Localized edema: Secondary | ICD-10-CM

## 2020-08-13 DIAGNOSIS — R34 Anuria and oliguria: Secondary | ICD-10-CM | POA: Diagnosis not present

## 2020-08-13 DIAGNOSIS — Z7982 Long term (current) use of aspirin: Secondary | ICD-10-CM

## 2020-08-13 DIAGNOSIS — J9600 Acute respiratory failure, unspecified whether with hypoxia or hypercapnia: Secondary | ICD-10-CM

## 2020-08-13 DIAGNOSIS — E861 Hypovolemia: Secondary | ICD-10-CM | POA: Diagnosis present

## 2020-08-13 LAB — URINALYSIS, COMPLETE (UACMP) WITH MICROSCOPIC
Bilirubin Urine: NEGATIVE
Glucose, UA: >=500 mg/dL — AB
Ketones, ur: 80 mg/dL — AB
Leukocytes,Ua: NEGATIVE
Nitrite: NEGATIVE
Protein, ur: 100 mg/dL — AB
Specific Gravity, Urine: 1.018 (ref 1.005–1.030)
pH: 5 (ref 5.0–8.0)

## 2020-08-13 LAB — CBG MONITORING, ED
Glucose-Capillary: 499 mg/dL — ABNORMAL HIGH (ref 70–99)
Glucose-Capillary: 516 mg/dL (ref 70–99)
Glucose-Capillary: 530 mg/dL (ref 70–99)
Glucose-Capillary: 507 mg/dL (ref 70–99)
Glucose-Capillary: 530 mg/dL (ref 70–99)
Glucose-Capillary: 527 mg/dL (ref 70–99)
Glucose-Capillary: 483 mg/dL — ABNORMAL HIGH (ref 70–99)
Glucose-Capillary: 453 mg/dL — ABNORMAL HIGH (ref 70–99)

## 2020-08-13 LAB — BASIC METABOLIC PANEL
Anion gap: 26 — ABNORMAL HIGH (ref 5–15)
BUN: 55 mg/dL — ABNORMAL HIGH (ref 8–23)
CO2: 7 mmol/L — ABNORMAL LOW (ref 22–32)
Calcium: 8.1 mg/dL — ABNORMAL LOW (ref 8.9–10.3)
Chloride: 107 mmol/L (ref 98–111)
Creatinine, Ser: 2.15 mg/dL — ABNORMAL HIGH (ref 0.61–1.24)
GFR, Estimated: 32 mL/min — ABNORMAL LOW (ref >60)
Glucose, Bld: 596 mg/dL (ref 70–99)
Potassium: 5.7 mmol/L — ABNORMAL HIGH (ref 3.5–5.1)
Sodium: 140 mmol/L (ref 135–145)

## 2020-08-13 LAB — CBC
HCT: 45.6 % (ref 39.0–52.0)
Hemoglobin: 14.3 g/dL (ref 13.0–17.0)
MCH: 30.3 pg (ref 26.0–34.0)
MCHC: 31.4 g/dL (ref 30.0–36.0)
MCV: 96.6 fL (ref 80.0–100.0)
Platelets: 440 10*3/uL — ABNORMAL HIGH (ref 150–400)
RBC: 4.72 MIL/uL (ref 4.22–5.81)
RDW: 15 % (ref 11.5–15.5)
WBC: 28.2 10*3/uL — ABNORMAL HIGH (ref 4.0–10.5)
nRBC: 0 % (ref 0.0–0.2)

## 2020-08-13 LAB — LACTIC ACID, PLASMA
Lactic Acid, Venous: 4.4 mmol/L (ref 0.5–1.9)
Lactic Acid, Venous: 3.8 mmol/L (ref 0.5–1.9)

## 2020-08-13 LAB — TROPONIN I (HIGH SENSITIVITY)
Troponin I (High Sensitivity): 19 ng/L — ABNORMAL HIGH (ref <18)
Troponin I (High Sensitivity): 19 ng/L — ABNORMAL HIGH (ref <18)

## 2020-08-13 LAB — PROTIME-INR
INR: 1.3 — ABNORMAL HIGH (ref 0.8–1.2)
Prothrombin Time: 15.4 seconds — ABNORMAL HIGH (ref 11.4–15.2)

## 2020-08-13 LAB — CBC WITH DIFFERENTIAL/PLATELET
Abs Immature Granulocytes: 2.58 10*3/uL — ABNORMAL HIGH (ref 0.00–0.07)
Basophils Absolute: 0 10*3/uL (ref 0.0–0.1)
Basophils Relative: 0 %
Eosinophils Absolute: 0 10*3/uL (ref 0.0–0.5)
Eosinophils Relative: 0 %
HCT: 53.9 % — ABNORMAL HIGH (ref 39.0–52.0)
Hemoglobin: 16.4 g/dL (ref 13.0–17.0)
Immature Granulocytes: 11 %
Lymphocytes Relative: 11 %
Lymphs Abs: 2.6 10*3/uL (ref 0.7–4.0)
MCH: 29.6 pg (ref 26.0–34.0)
MCHC: 30.4 g/dL (ref 30.0–36.0)
MCV: 97.3 fL (ref 80.0–100.0)
Monocytes Absolute: 1.9 10*3/uL — ABNORMAL HIGH (ref 0.1–1.0)
Monocytes Relative: 8 %
Neutro Abs: 16.1 10*3/uL — ABNORMAL HIGH (ref 1.7–7.7)
Neutrophils Relative %: 70 %
Platelets: 514 10*3/uL — ABNORMAL HIGH (ref 150–400)
RBC: 5.54 MIL/uL (ref 4.22–5.81)
RDW: 14.8 % (ref 11.5–15.5)
Smear Review: NORMAL
WBC: 23.2 10*3/uL — ABNORMAL HIGH (ref 4.0–10.5)
nRBC: 0 % (ref 0.0–0.2)

## 2020-08-13 LAB — BLOOD GAS, ARTERIAL
Acid-base deficit: 29.3 mmol/L — ABNORMAL HIGH (ref 0.0–2.0)
Bicarbonate: 4.2 mmol/L — ABNORMAL LOW (ref 20.0–28.0)
FIO2: 0.5
MECHVT: 450 mL
O2 Saturation: 99.2 %
PEEP: 5 cmH2O
Patient temperature: 37
RATE: 18 resp/min
pCO2 arterial: 25 mmHg — ABNORMAL LOW (ref 32.0–48.0)
pH, Arterial: <6.9 — CL (ref 7.350–7.450)
pO2, Arterial: 225 mmHg — ABNORMAL HIGH (ref 83.0–108.0)

## 2020-08-13 LAB — COMPREHENSIVE METABOLIC PANEL
ALT: 22 U/L (ref 0–44)
AST: 32 U/L (ref 15–41)
Albumin: 3.6 g/dL (ref 3.5–5.0)
Alkaline Phosphatase: 72 U/L (ref 38–126)
BUN: 51 mg/dL — ABNORMAL HIGH (ref 8–23)
CO2: <7 mmol/L — ABNORMAL LOW (ref 22–32)
Calcium: 9 mg/dL (ref 8.9–10.3)
Chloride: 109 mmol/L (ref 98–111)
Creatinine, Ser: 2.01 mg/dL — ABNORMAL HIGH (ref 0.61–1.24)
GFR, Estimated: 35 mL/min — ABNORMAL LOW (ref >60)
Glucose, Bld: 487 mg/dL — ABNORMAL HIGH (ref 70–99)
Potassium: 4.5 mmol/L (ref 3.5–5.1)
Sodium: 142 mmol/L (ref 135–145)
Total Bilirubin: 1.9 mg/dL — ABNORMAL HIGH (ref 0.3–1.2)
Total Protein: 8.1 g/dL (ref 6.5–8.1)

## 2020-08-13 LAB — POC SARS CORONAVIRUS 2 AG -  ED: SARS Coronavirus 2 Ag: POSITIVE — AB

## 2020-08-13 IMAGING — DX DG CHEST 1V PORT
1 series · 1 of 1 positions shown · non-contrast
Comparison: [DATE]

CLINICAL DATA: Chest pain shortness of breath. Recent [DB]
diagnosis.

EXAM:
PORTABLE CHEST 1 VIEW

[chest ap]
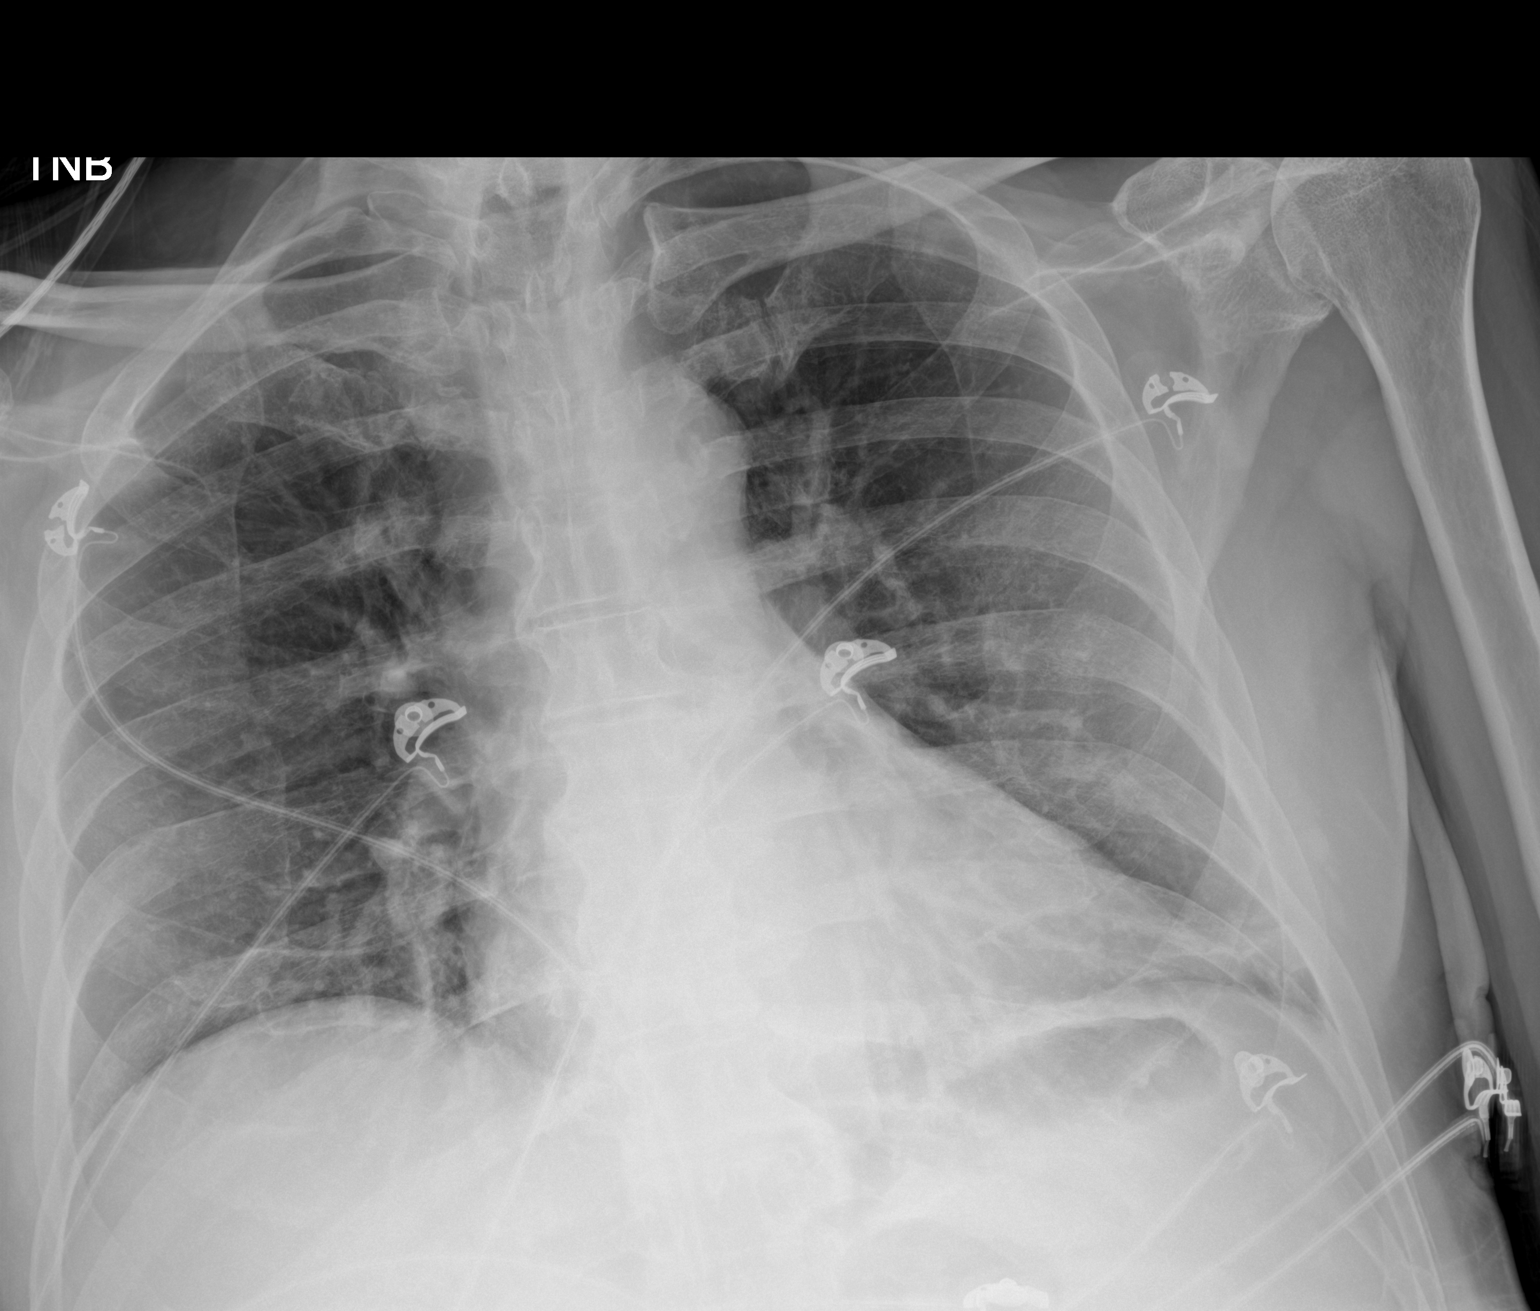

[1 of 1 positions shown; findings below may reference images not displayed]

FINDINGS: Midline trachea. Borderline cardiomegaly. No pleural effusion or
pneumothorax. Suspect mild left base scarring. Clear right lung.
IMPRESSION: Borderline cardiomegaly, without acute disease.

## 2020-08-13 IMAGING — DX DG ABDOMEN 1V
1 series · 1 of 1 positions shown · non-contrast
Comparison: Abdominal radiographs [DATE]. Abdominal CT
[DATE].

CLINICAL DATA: Nasogastric tube placement.

EXAM:
ABDOMEN - 1 VIEW

[abdomen supine]
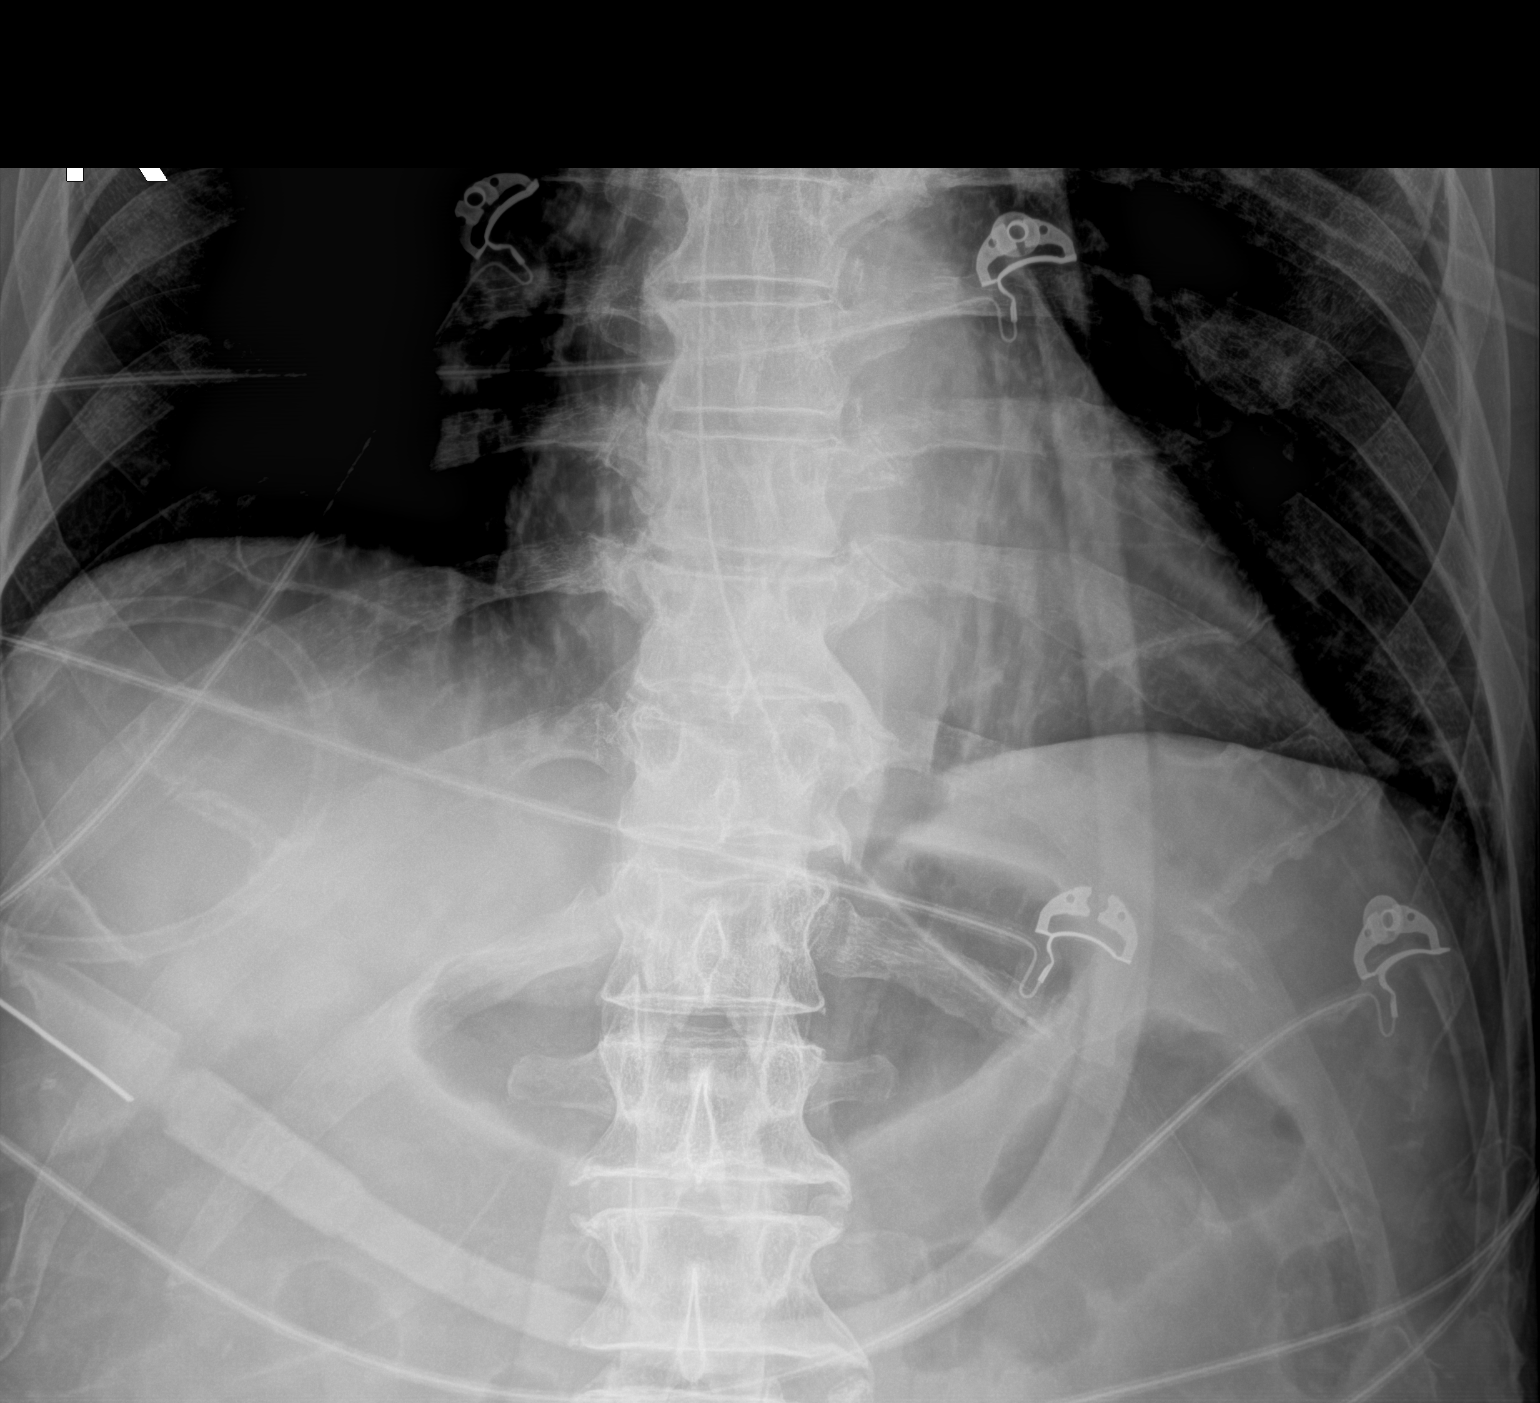

[1 of 1 positions shown; findings below may reference images not displayed]

FINDINGS: [2D] hours. Enteric tube projects below the diaphragm, with tip
overlying the gastric fundal region. Side hole is near the
gastroesophageal junction. The visualized bowel gas pattern is
normal. There are degenerative changes within the thoracolumbar
spine.
IMPRESSION: Enteric tube projects to the level of the proximal stomach.

## 2020-08-13 IMAGING — DX DG CHEST 1V PORT
1 series · 1 of 1 positions shown · non-contrast
Comparison: [DATE]

CLINICAL DATA: Attempted central line placement. Assess for
pneumothorax

EXAM:
PORTABLE CHEST 1 VIEW

[chest ap]
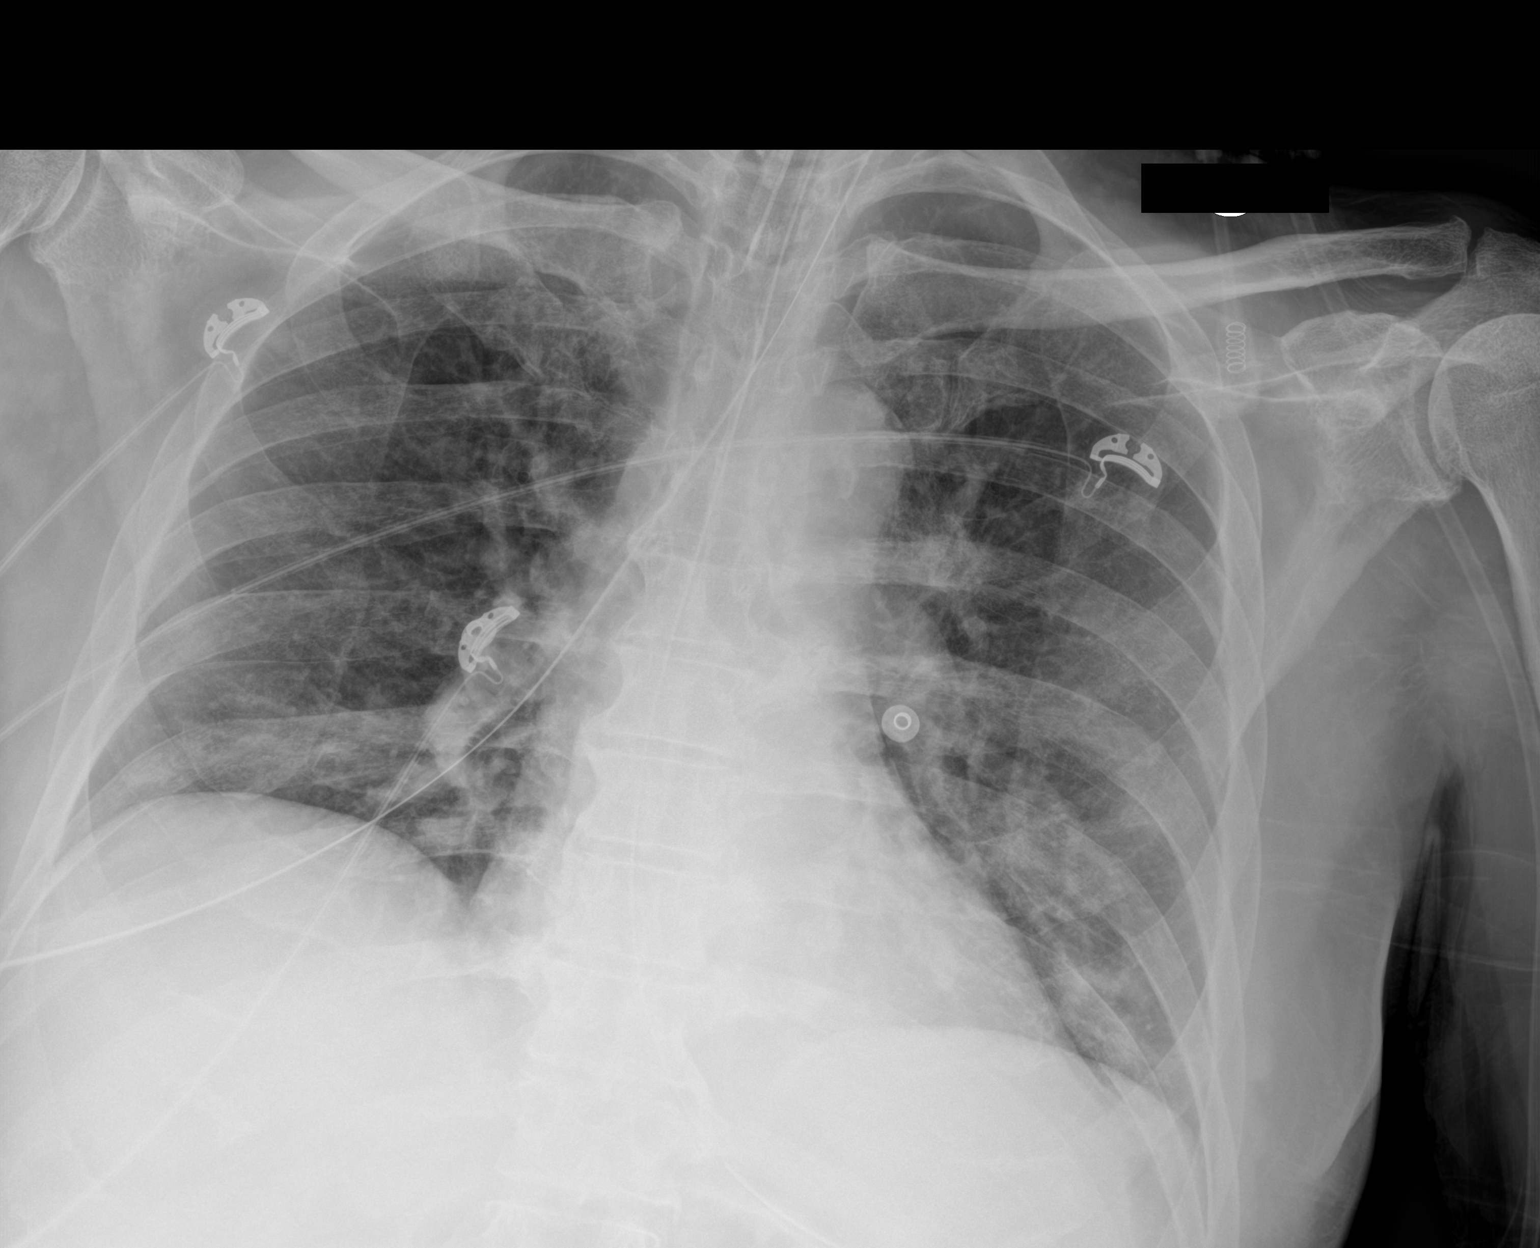

[1 of 1 positions shown; findings below may reference images not displayed]

FINDINGS: Endotracheal tube is 7.6 cm above the carina. NG tube is in the
stomach. No visible pneumothorax. Heart is normal size. Bibasilar
airspace opacities. No effusions.
IMPRESSION: No visible pneumothorax.

Bibasilar atelectasis or infiltrates.

## 2020-08-13 IMAGING — CT CT HEAD W/O CM
5 of 8 series · 17 of 47 positions shown, 18 images · non-contrast
Comparison: None.

CLINICAL DATA: Change in mental status

EXAM:
CT HEAD WITHOUT CONTRAST
TECHNIQUE: Contiguous axial images were obtained from the base of the skull
through the vertex without intravenous contrast.

[Series 2: head wo · axial · 0.41mm/px · z∈[-126,-76]mm · 2 of 31 slices shown, 3 images (1 of 2)]
[im 11/31  brain]
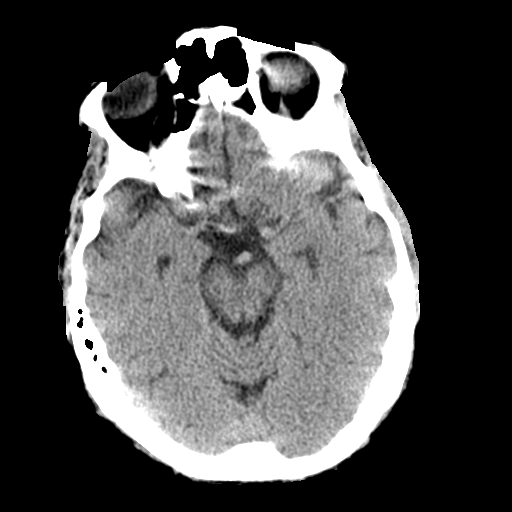
[im 11/31  bone]
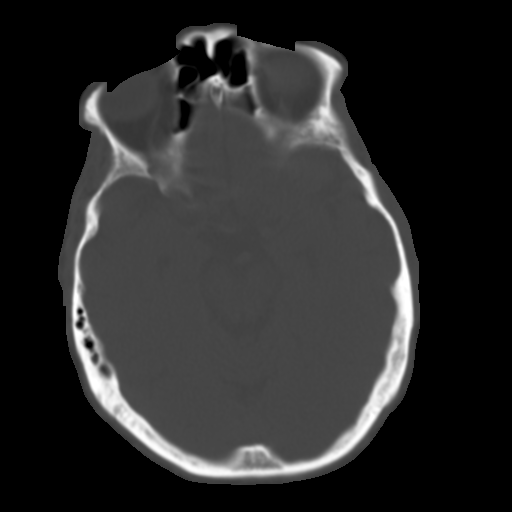
[im 21/31  brain]
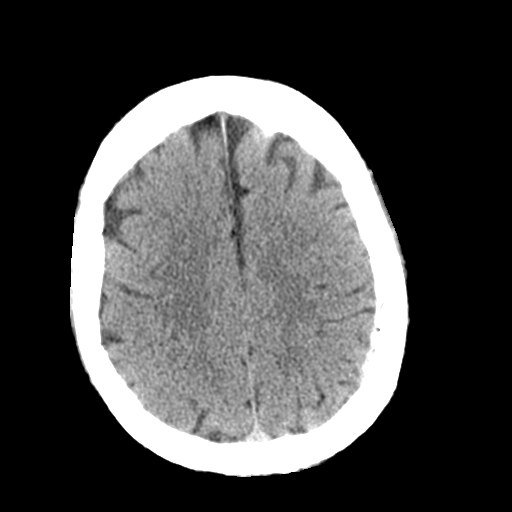

[Series 3: head bone · axial · 0.41mm/px · z∈[-162,-40]mm · 8 of 77 slices shown]
[im 8/77  bone]
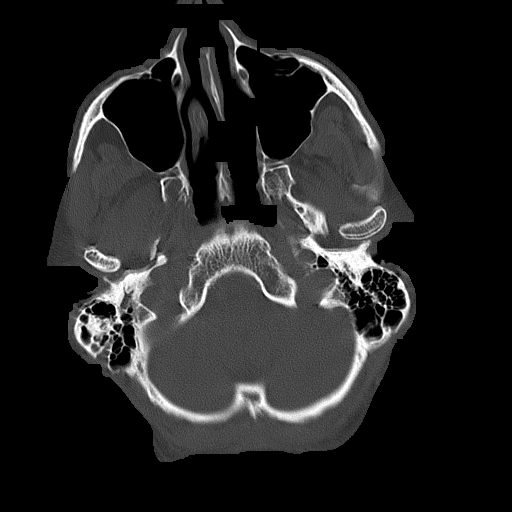
[im 16/77  bone]
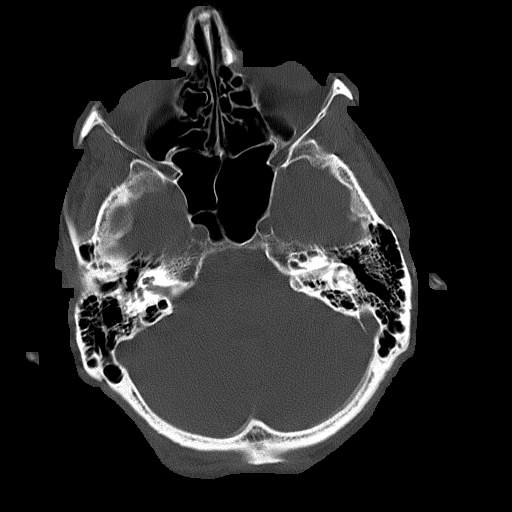
[im 23/77  bone]
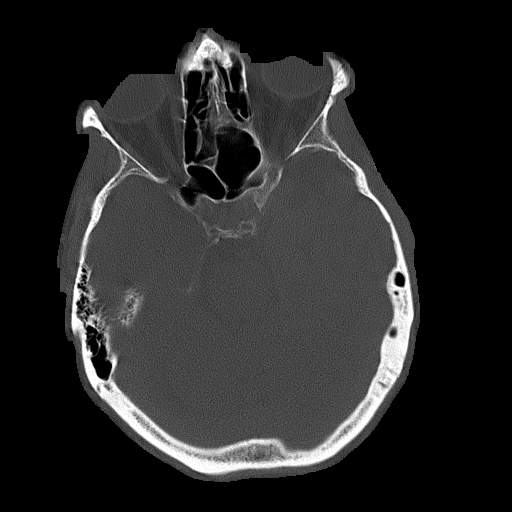
[im 31/77  bone]
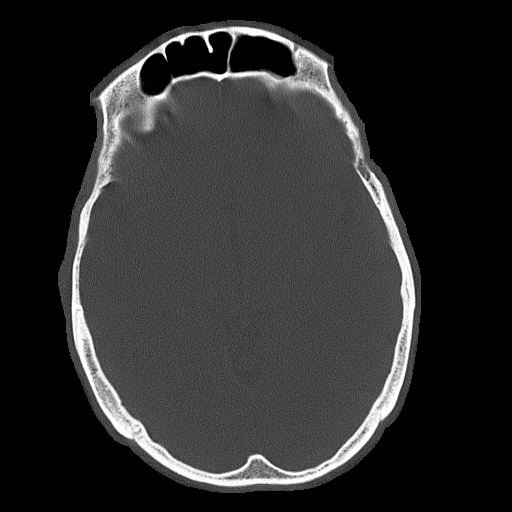
[im 46/77  bone]
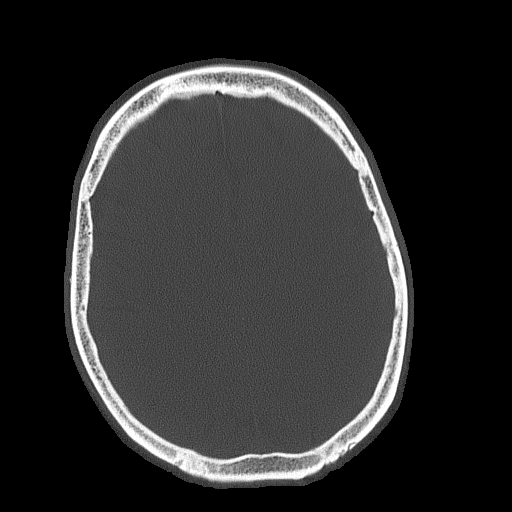
[im 54/77  bone]
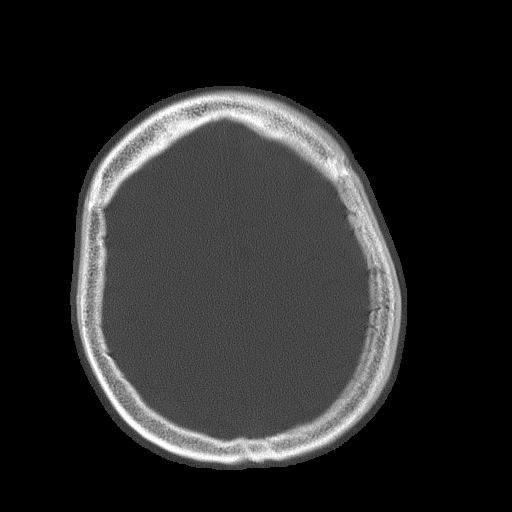
[im 61/77  bone]
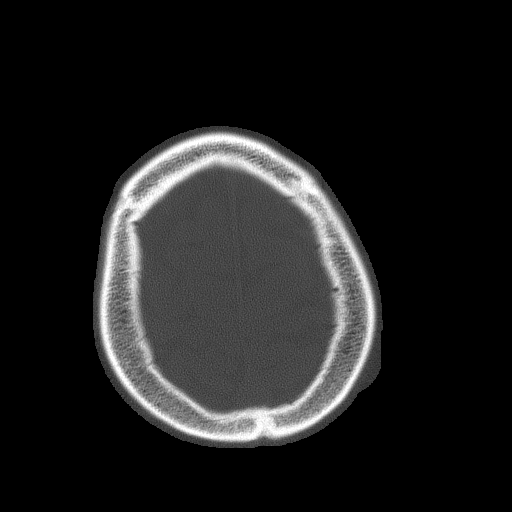
[im 69/77  bone]
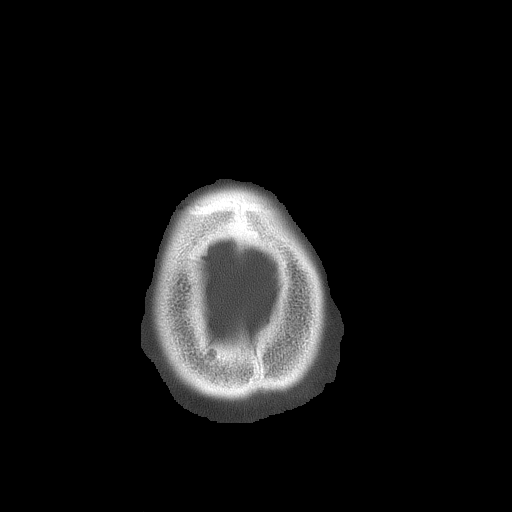

[Series 4: head wo · axial · 0.41mm/px · z∈[-125,-75]mm · 2 of 31 slices shown (2 of 2)]
[im 11/31  brain]
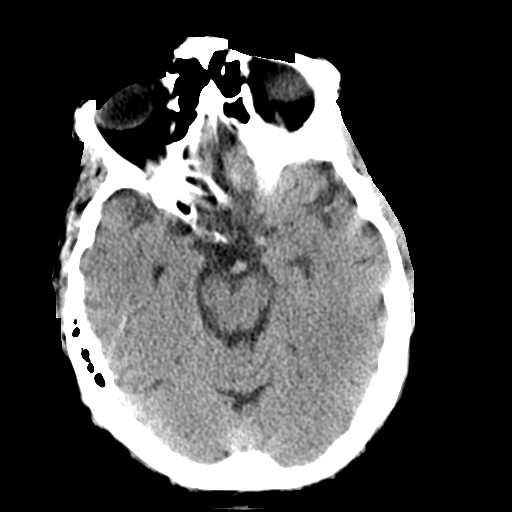
[im 21/31  brain]
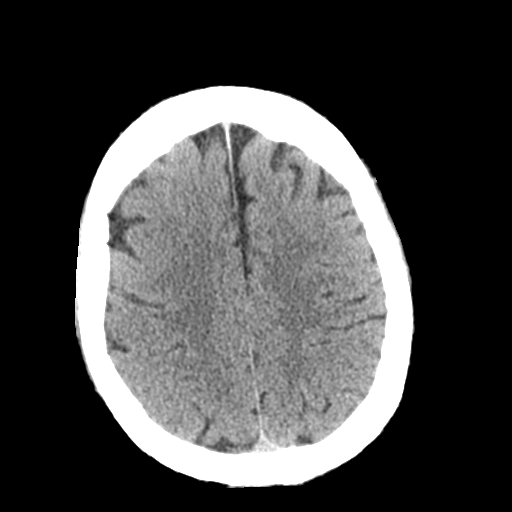

[Series 6: coronal soft tissue · coronal · 0.29mm/px · 3 of 70 slices shown]
[im 18/70  brain]
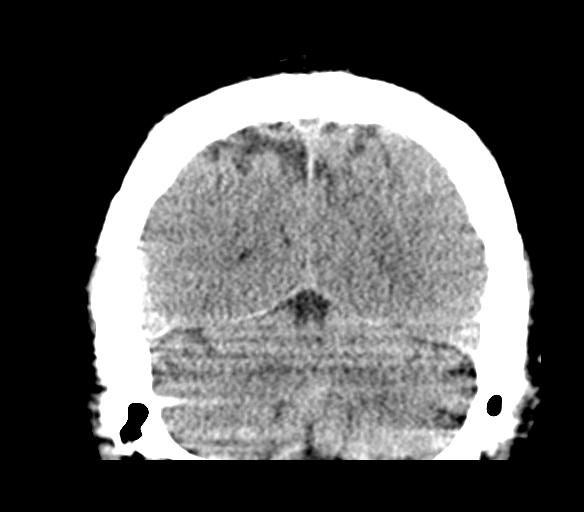
[im 35/70  brain]
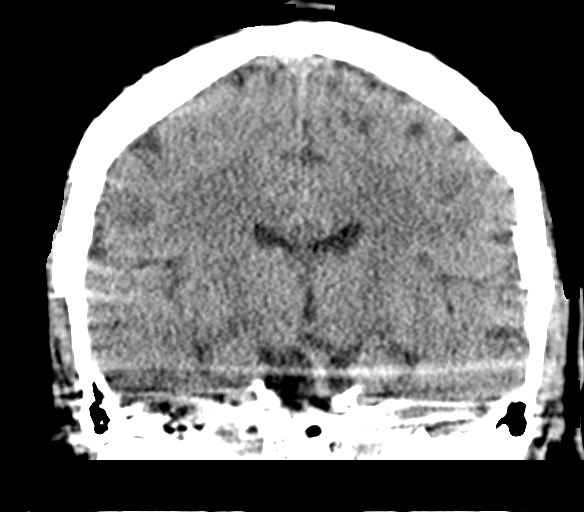
[im 52/70  brain]
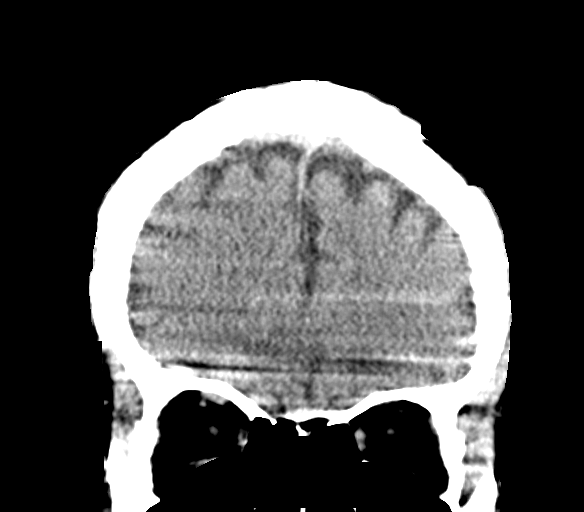

[Series 7: sagittal soft tissue · sagittal · 0.28mm/px · 2 of 63 slices shown]
[im 21/63  brain]
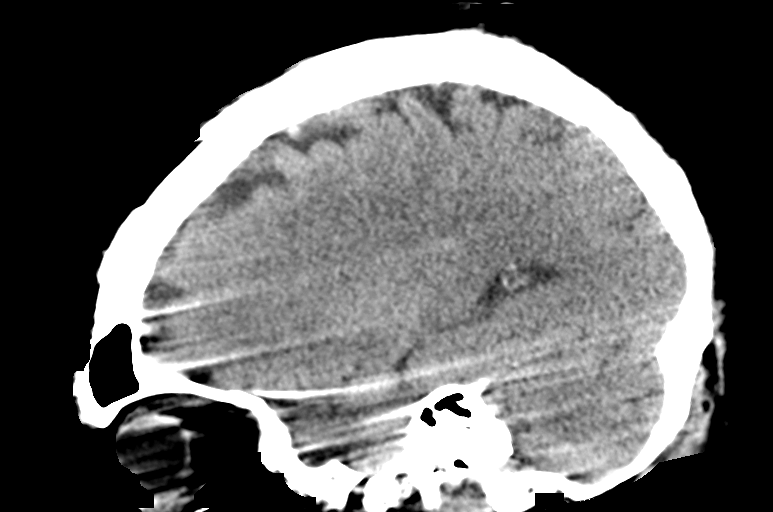
[im 42/63  brain]
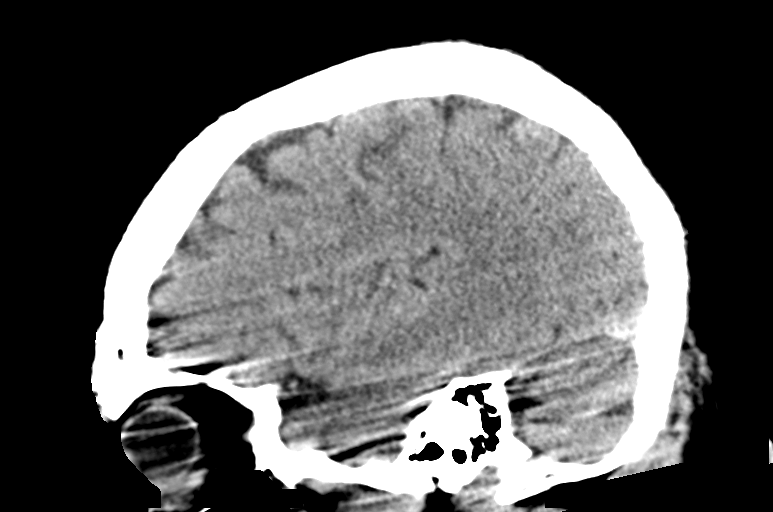

[17 of 47 positions shown; findings below may reference images not displayed]

FINDINGS: Brain: No evidence of acute infarction, hemorrhage, hydrocephalus,
extra-axial collection or mass lesion/mass effect.

Vascular: No hyperdense vessel or unexpected calcification.

Skull: Normal. Negative for fracture or focal lesion.

Sinuses/Orbits: No acute finding.  Left cataract resection.

Other: Pervasive motion artifact to a moderate degree.
IMPRESSION: Negative motion degraded head CT.

## 2020-08-13 IMAGING — DX DG CHEST 1V PORT
1 series · 1 of 1 positions shown · non-contrast
Comparison: [DATE] study obtained earlier in the day

CLINICAL DATA: Hypoxia.  Reported nasogastric tube placement

EXAM:
PORTABLE CHEST 1 VIEW

[chest ap]
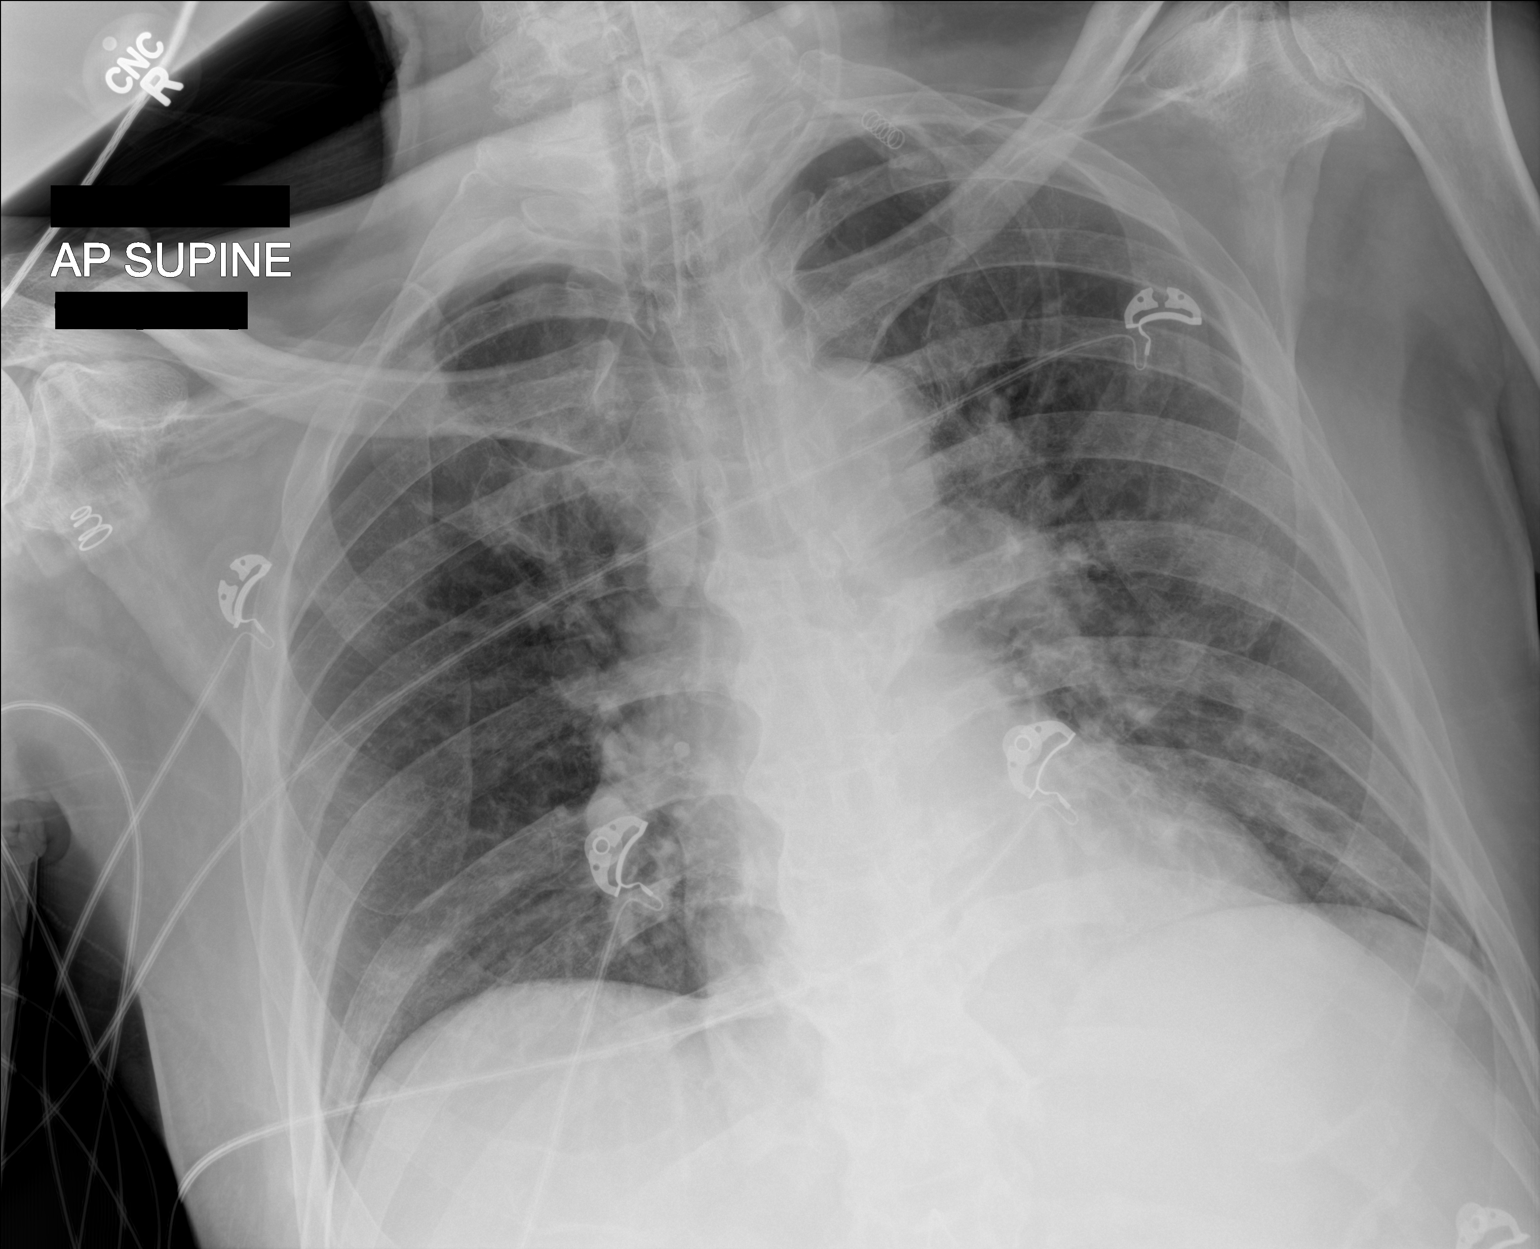

[1 of 1 positions shown; findings below may reference images not displayed]

FINDINGS: A nasogastric tube is not appreciable on current examination.
Endotracheal tube tip is 6.8 cm above the carina. No pneumothorax.
Lungs are clear. Heart size and pulmonary vascularity are normal. No
adenopathy. There is degenerative change in the thoracic spine.
IMPRESSION: Endotracheal tube as described without pneumothorax. No nasogastric
tube apparent. Lungs clear. Cardiac silhouette normal.

## 2020-08-13 MED ORDER — FENTANYL 2500MCG IN NS 250ML (10MCG/ML) PREMIX INFUSION
0.0000 ug/h | INTRAVENOUS | Status: DC
  Administered 2020-08-13: 25 ug/h via INTRAVENOUS
  Filled 2020-08-13: qty 250

## 2020-08-13 MED ORDER — PHYTONADIONE 5 MG PO TABS
2.5000 mg | ORAL_TABLET | Freq: Once | ORAL | Status: DC
  Filled 2020-08-13: qty 1

## 2020-08-13 MED ORDER — SODIUM CHLORIDE 0.9 % IV BOLUS
1000.0000 mL | Freq: Once | INTRAVENOUS | Status: AC
  Administered 2020-08-13: 1000 mL via INTRAVENOUS

## 2020-08-13 MED ORDER — FENTANYL BOLUS VIA INFUSION
25.0000 ug | INTRAVENOUS | Status: DC | PRN
  Administered 2020-08-15: 25 ug via INTRAVENOUS
  Administered 2020-08-16: 50 ug via INTRAVENOUS
  Administered 2020-08-18 – 2020-08-20 (×2): 25 ug via INTRAVENOUS
  Filled 2020-08-13: qty 25

## 2020-08-13 MED ORDER — SODIUM CHLORIDE 0.9 % IV BOLUS
1000.0000 mL | Freq: Once | INTRAVENOUS | Status: AC
  Administered 2020-08-14: 1000 mL via INTRAVENOUS

## 2020-08-13 MED ORDER — PROPOFOL 1000 MG/100ML IV EMUL
0.0000 ug/kg/min | INTRAVENOUS | Status: DC
  Administered 2020-08-13: 40 ug/kg/min via INTRAVENOUS
  Administered 2020-08-13: 50 ug/kg/min via INTRAVENOUS
  Administered 2020-08-14: 40 ug/kg/min via INTRAVENOUS
  Filled 2020-08-13 (×5): qty 100

## 2020-08-13 MED ORDER — DEXTROSE 50 % IV SOLN
0.0000 mL | INTRAVENOUS | Status: DC | PRN
Start: 2020-08-13 — End: 2020-08-15

## 2020-08-13 MED ORDER — LACTATED RINGERS IV BOLUS (SEPSIS): 500.0000 mL | Freq: Once | INTRAVENOUS | Status: DC

## 2020-08-13 MED ORDER — ONDANSETRON HCL 4 MG/2ML IJ SOLN: 4.0000 mg | Freq: Four times a day (QID) | INTRAMUSCULAR | Status: DC | PRN

## 2020-08-13 MED ORDER — ACETAMINOPHEN 325 MG PO TABS: 650.0000 mg | ORAL_TABLET | ORAL | Status: DC | PRN

## 2020-08-13 MED ORDER — FENTANYL 2500MCG IN NS 250ML (10MCG/ML) PREMIX INFUSION
0.0000 ug/h | INTRAVENOUS | Status: DC
  Administered 2020-08-14: 18:00:00 200 ug/h via INTRAVENOUS
  Administered 2020-08-15: 350 ug/h via INTRAVENOUS
  Administered 2020-08-15: 22:00:00 275 ug/h via INTRAVENOUS
  Administered 2020-08-15: 01:00:00 360 ug/h via INTRAVENOUS
  Administered 2020-08-16 – 2020-08-17 (×3): 275 ug/h via INTRAVENOUS
  Administered 2020-08-17: 200 ug/h via INTRAVENOUS
  Administered 2020-08-18: 02:00:00 150 ug/h via INTRAVENOUS
  Administered 2020-08-18: 23:00:00 100 ug/h via INTRAVENOUS
  Administered 2020-08-19: 13:00:00 300 ug/h via INTRAVENOUS
  Administered 2020-08-19: 21:00:00 350 ug/h via INTRAVENOUS
  Administered 2020-08-20: 04:00:00 400 ug/h via INTRAVENOUS
  Administered 2020-08-20 – 2020-08-21 (×3): 200 ug/h via INTRAVENOUS
  Administered 2020-08-21: 20:00:00 175 ug/h via INTRAVENOUS
  Administered 2020-08-22: 225 ug/h via INTRAVENOUS
  Administered 2020-08-22: 150 ug/h via INTRAVENOUS
  Administered 2020-08-23 (×2): 225 ug/h via INTRAVENOUS
  Administered 2020-08-24: 50 ug/h via INTRAVENOUS
  Administered 2020-08-24: 225 ug/h via INTRAVENOUS
  Filled 2020-08-13 (×24): qty 250

## 2020-08-13 MED ORDER — POLYETHYLENE GLYCOL 3350 17 G PO PACK: 17.0000 g | PACK | Freq: Every day | ORAL | Status: DC | PRN

## 2020-08-13 MED ORDER — HEPARIN SODIUM (PORCINE) 5000 UNIT/ML IJ SOLN
5000.0000 [IU] | Freq: Three times a day (TID) | INTRAMUSCULAR | Status: DC
  Administered 2020-08-13 – 2020-08-16 (×8): 5000 [IU] via SUBCUTANEOUS
  Filled 2020-08-13 (×8): qty 1

## 2020-08-13 MED ORDER — LACTATED RINGERS IV BOLUS (SEPSIS)
1000.0000 mL | Freq: Once | INTRAVENOUS | Status: AC
  Administered 2020-08-13: 1000 mL via INTRAVENOUS

## 2020-08-13 MED ORDER — FENTANYL CITRATE (PF) 100 MCG/2ML IJ SOLN: 25.0000 ug | Freq: Once | INTRAMUSCULAR | Status: DC

## 2020-08-13 MED ORDER — SODIUM CHLORIDE 0.9 % IV SOLN
250.0000 mL | INTRAVENOUS | Status: DC | PRN
  Administered 2020-08-30: 250 mL via INTRAVENOUS

## 2020-08-13 MED ORDER — SODIUM CHLORIDE 0.9 % IV SOLN
250.0000 mL | INTRAVENOUS | Status: DC
  Administered 2020-08-13: 250 mL via INTRAVENOUS

## 2020-08-13 MED ORDER — SODIUM BICARBONATE 8.4 % IV SOLN
INTRAVENOUS | Status: DC
  Filled 2020-08-13 (×3): qty 850

## 2020-08-13 MED ORDER — MIDAZOLAM HCL 2 MG/2ML IJ SOLN: 1.0000 mg | INTRAMUSCULAR | Status: DC | PRN

## 2020-08-13 MED ORDER — SODIUM CHLORIDE 0.9 % IV SOLN
2.0000 g | Freq: Once | INTRAVENOUS | Status: AC
  Administered 2020-08-13: 2 g via INTRAVENOUS
  Filled 2020-08-13: qty 2

## 2020-08-13 MED ORDER — SODIUM CHLORIDE 0.9% FLUSH
3.0000 mL | Freq: Two times a day (BID) | INTRAVENOUS | Status: DC
  Administered 2020-08-13 – 2020-08-29 (×29): 3 mL via INTRAVENOUS

## 2020-08-13 MED ORDER — DOCUSATE SODIUM 50 MG/5ML PO LIQD
100.0000 mg | Freq: Two times a day (BID) | ORAL | Status: DC
  Administered 2020-08-14 – 2020-08-28 (×22): 100 mg
  Filled 2020-08-13 (×27): qty 10

## 2020-08-13 MED ORDER — NOREPINEPHRINE 4 MG/250ML-% IV SOLN
INTRAVENOUS | Status: AC
  Administered 2020-08-13: 5 ug/min via INTRAVENOUS
  Filled 2020-08-13: qty 250

## 2020-08-13 MED ORDER — ETOMIDATE 2 MG/ML IV SOLN
30.0000 mg | Freq: Once | INTRAVENOUS | Status: AC
  Administered 2020-08-13: 30 mg via INTRAVENOUS

## 2020-08-13 MED ORDER — SODIUM BICARBONATE 8.4 % IV SOLN
50.0000 meq | Freq: Once | INTRAVENOUS | Status: AC
  Administered 2020-08-13: 50 meq via INTRAVENOUS
  Filled 2020-08-13: qty 50

## 2020-08-13 MED ORDER — NOREPINEPHRINE 16 MG/250ML-% IV SOLN
0.0000 ug/min | INTRAVENOUS | Status: DC
  Administered 2020-08-14: 9 ug/min via INTRAVENOUS
  Administered 2020-08-14: 13 ug/min via INTRAVENOUS
  Filled 2020-08-13 (×3): qty 250

## 2020-08-13 MED ORDER — DOCUSATE SODIUM 100 MG PO CAPS: 100.0000 mg | ORAL_CAPSULE | Freq: Two times a day (BID) | ORAL | Status: DC | PRN

## 2020-08-13 MED ORDER — NOREPINEPHRINE 4 MG/250ML-% IV SOLN: 2.0000 ug/min | INTRAVENOUS | Status: DC

## 2020-08-13 MED ORDER — FAMOTIDINE IN NACL 20-0.9 MG/50ML-% IV SOLN
20.0000 mg | Freq: Two times a day (BID) | INTRAVENOUS | Status: DC
  Administered 2020-08-13 – 2020-08-14 (×2): 20 mg via INTRAVENOUS
  Filled 2020-08-13 (×3): qty 50

## 2020-08-13 MED ORDER — INSULIN REGULAR(HUMAN) IN NACL 100-0.9 UT/100ML-% IV SOLN
INTRAVENOUS | Status: DC
  Administered 2020-08-13 – 2020-08-15 (×2): 8 [IU]/h via INTRAVENOUS
  Filled 2020-08-13 (×5): qty 100

## 2020-08-13 MED ORDER — SODIUM CHLORIDE 0.9% FLUSH
3.0000 mL | INTRAVENOUS | Status: DC | PRN
  Administered 2020-08-27: 3 mL via INTRAVENOUS

## 2020-08-13 MED ORDER — MIDAZOLAM HCL 2 MG/2ML IJ SOLN
1.0000 mg | INTRAMUSCULAR | Status: AC | PRN
  Administered 2020-08-16 (×3): 1 mg via INTRAVENOUS
  Filled 2020-08-13 (×2): qty 2

## 2020-08-13 MED ORDER — SUCCINYLCHOLINE CHLORIDE 20 MG/ML IJ SOLN
120.0000 mg | Freq: Once | INTRAMUSCULAR | Status: AC
  Administered 2020-08-13: 120 mg via INTRAVENOUS

## 2020-08-13 MED ORDER — VANCOMYCIN HCL IN DEXTROSE 1-5 GM/200ML-% IV SOLN
1000.0000 mg | Freq: Once | INTRAVENOUS | Status: AC
  Administered 2020-08-13: 1000 mg via INTRAVENOUS
  Filled 2020-08-13: qty 200

## 2020-08-13 MED ORDER — POLYETHYLENE GLYCOL 3350 17 G PO PACK
17.0000 g | PACK | Freq: Every day | ORAL | Status: DC
  Administered 2020-08-15 – 2020-08-17 (×3): 17 g
  Filled 2020-08-13 (×4): qty 1

## 2020-08-13 MED ORDER — FAMOTIDINE IN NACL 20-0.9 MG/50ML-% IV SOLN: 20.0000 mg | Freq: Two times a day (BID) | INTRAVENOUS | Status: DC

## 2020-08-13 MED ORDER — PROPOFOL 1000 MG/100ML IV EMUL
5.0000 ug/kg/min | INTRAVENOUS | Status: DC
  Administered 2020-08-13: 5 ug/kg/min via INTRAVENOUS
  Filled 2020-08-13: qty 100

## 2020-08-13 MED ORDER — LACTATED RINGERS IV BOLUS
1000.0000 mL | Freq: Once | INTRAVENOUS | Status: AC
  Administered 2020-08-13: 1000 mL via INTRAVENOUS

## 2020-08-13 MED ORDER — STERILE WATER FOR INJECTION IV SOLN
INTRAVENOUS | Status: DC
  Filled 2020-08-13: qty 850
  Filled 2020-08-13: qty 150
  Filled 2020-08-13 (×2): qty 850

## 2020-08-13 NOTE — ED Notes (Signed)
Date and time results received: 08/18/2020 5:00 PM   Test: glucose Critical Value: 596  Name of Provider Notified: Mortimer Fries, MD   Orders Received? Or Actions Taken?: Attending notified. Awaiting orders.

## 2020-08-13 NOTE — Progress Notes (Signed)
GOALS OF CARE DISCUSSION  The Clinical status was relayed to family in detail. Daughter At [employer] as OR nurse at [facility]  Updated and notified of patients medical condition.  Patient remains unresponsive and will not open eyes to command.    patient with increased WOB and using accessory muscles to breathe Explained to family course of therapy and the modalities     Patient with Progressive multiorgan failure  Family understands the situation.  Family are satisfied with Plan of action and management. All questions answered  Additional CC time 32 mins   Kollyns Mickelson Patricia Pesa, M.D.  [facility]  Medical Director [place] Director [facility] Cardio-Pulmonary Department

## 2020-08-13 NOTE — Progress Notes (Signed)
Attempt made to place Right IJ CVC due to vasopressors and lack of IV access.  Pt's daughter is at bedside, gives consent for placement.  Able to successfully cannulate the right IJ, however pt is severely hypovolemic as pt IJ is collapsing with respirations.  Able to intermittently draw back blood when vessel not collapsed.  Attempted 2 more times with same result.  Therefore attempt aborted. Pressure held at site, no signs of complications or change in vitals.   Will give additional IVF, and will place femoral CVC.  Will obtain CXR to assess for PTX.     Darel Hong, AGACNP-BC [facility] Pager: [phone]

## 2020-08-13 NOTE — H&P (Addendum)
Name: Nathaniel Thomas MRN: [id number] DOB: 01-Jul-1951     CONSULTATION DATE: 08/29/2020  REFERRING MD :  Raliegh Ip  CHIEF COMPLAINT: Resp failure/COVID/DKA  HISTORY OF PRESENT ILLNESS:    70 y.o. male with PMH as noted below who presents with altered mental status after he was found unresponsive this morning.    Per EMS he was initially hypoxic although this appears to have resolved.  The patient himself is unable to give any history.  Patient Dx with COVID 1 week ago +NVD for several days ER shows severe acidosis with elevated sugars Patient with severe hypothermia and intubated for severe resp failure   PAST MEDICAL HISTORY :   has a past medical history of CAD (coronary artery disease), Cataract, CHICKENPOX, HX OF (08/30/2008), Chronic kidney disease, Diabetes mellitus, Glaucoma, History of urinary calculi (08/30/2008), Hyperlipidemia, Hypertension, Renal calculus or stone, and Skin cancer of nose.  has a past surgical history that includes Left knee replacement (2002/ 2004); Tonsillectomy; Lithotripsy; Laminectomy (1983/1988); Excision basal cell carcinoma; Ankle surgery; and Mohs surgery. Prior to Admission medications   Medication Sig Start Date End Date Taking? Authorizing Provider  aspirin 81 MG chewable tablet Chew 81 mg by mouth daily. 07/14/20  Yes [provider]  Cholecalciferol (VITAMIN D3) 250 MCG (10000 UT) capsule Take 10,000 Units by mouth daily.   Yes [provider]  doxycycline (VIBRAMYCIN) 100 MG capsule Take 100 mg by mouth 2 (two) times daily. 08/07/20 08/14/20 Yes [provider]  empagliflozin (JARDIANCE) 25 MG TABS tablet Take 25 mg by mouth daily. 06/30/20  Yes [provider]  finasteride (PROSCAR) 5 MG tablet Take 5 mg by mouth daily.   Yes [provider]  fluticasone (FLONASE) 50 MCG/ACT nasal spray Place 1 spray into both nostrils 2 (two) times daily. 08/02/20  Yes [provider]  glimepiride (AMARYL) 4  MG tablet Take 4 mg by mouth daily. 05/25/20  Yes [provider]  insulin detemir (LEVEMIR) 100 UNIT/ML injection Inject 25 Units into the skin 2 (two) times daily.   Yes [provider]  lisinopril (ZESTRIL) 5 MG tablet Take 5 mg by mouth daily. 07/13/20  Yes [provider]  LUMIGAN 0.01 % SOLN Place 1 drop into both eyes at bedtime.   Yes [provider]  metFORMIN (GLUCOPHAGE) 500 MG tablet Take 1,000 mg by mouth 2 (two) times daily. 06/25/20  Yes [provider]  Pitavastatin Calcium 4 MG TABS Take 4 mg by mouth daily. 05/22/20  Yes [provider]  tamsulosin (FLOMAX) 0.4 MG CAPS capsule Take 0.4 mg by mouth daily after supper.  04/16/14  Yes [provider]  zinc gluconate 50 MG tablet Take 50 mg by mouth daily.   Yes [provider]   Allergies  Allergen Reactions  . Demerol Other (See Comments)    Blood pressure dropped completely out  . Meperidine Other (See Comments)    Drops blood pressure out  Blood pressure dropped completely out  . Meperidine Hcl Other (See Comments)    Drops blood pressure out  . Nitrospan [Nitroglycerin] Other (See Comments)    Blood pressure dropped out  . Sulfa Antibiotics Other (See Comments)    Urinary burning; blisters   . Sulfasalazine Other (See Comments)    Urinary burning; blisters     FAMILY HISTORY:  family history includes Cancer in his sister; Heart disease in his brother, brother, father, and mother. SOCIAL HISTORY:  reports that he has never smoked. He  quit smokeless tobacco use about 16 years ago. He reports that he does not drink alcohol and does not use drugs.  REVIEW OF SYSTEMS:   Unable to obtain due to critical illness      Estimated body mass index is 24.34 kg/m as calculated from the following:   Height as of this encounter: 6\' 4"  (1.93 m).   Weight as of this encounter: 90.7 kg.    VITAL SIGNS: Temp:  [89.2 F (31.8 C)-96.2 F (35.7 C)] 96.2  F (35.7 C) (01/10 1500) Pulse Rate:  [102-141] 117 (01/10 1500) Resp:  [20-34] 23 (01/10 1500) BP: (106-173)/(55-96) 116/55 (01/10 1500) SpO2:  [98 %-100 %] 100 % (01/10 1500) Weight:  [90.7 kg] 90.7 kg (01/10 0947)   No intake/output data recorded. Total I/O In: 3050 [IV Piggyback:3050] Out: 30 [Emesis/NG output:30]   SpO2: 100 %   Physical Examination:  GENERAL:critically ill appearing, +resp distress HEAD: Normocephalic, atraumatic.  EYES: Pupils equal, round, reactive to light.  No scleral icterus.  MOUTH: Moist mucosal membrane. NECK: Supple. No JVD.  PULMONARY: +rhonchi, +wheezing CARDIOVASCULAR: S1 and S2. Regular rate and rhythm. No murmurs, rubs, or gallops.  GASTROINTESTINAL: Soft, nontender, -distended.  Positive bowel sounds.  MUSCULOSKELETAL: No swelling, clubbing, or edema.  NEUROLOGIC: obtunded SKIN:intact,warm,dry  I personally reviewed lab work that was obtained in last 24 hrs. CXR Independently reviewed-No acute infiltrates  MEDICATIONS: I have reviewed all medications and confirmed regimen as documented   CULTURE RESULTS   No results found for this or any previous visit (from the past 240 hour(s)).        IMAGING    DG Abdomen 1 View  Result Date: 08/26/20 CLINICAL DATA:  Nasogastric tube placement. EXAM: ABDOMEN - 1 VIEW COMPARISON:  Abdominal radiographs 10/09/2016. Abdominal CT 06/15/2018. FINDINGS: 1353 hours. Enteric tube projects below the diaphragm, with tip overlying the gastric fundal region. Side hole is near the gastroesophageal junction. The visualized bowel gas pattern is normal. There are degenerative changes within the thoracolumbar spine. IMPRESSION: Enteric tube projects to the level of the proximal stomach. Electronically Signed   By: Richardean Sale M.D.   On: 08/26/20 14:07   CT Head Wo Contrast  Result Date: 08-26-20 CLINICAL DATA:  Change in mental status EXAM: CT HEAD WITHOUT CONTRAST TECHNIQUE: Contiguous axial  images were obtained from the base of the skull through the vertex without intravenous contrast. COMPARISON:  None. FINDINGS: Brain: No evidence of acute infarction, hemorrhage, hydrocephalus, extra-axial collection or mass lesion/mass effect. Vascular: No hyperdense vessel or unexpected calcification. Skull: Normal. Negative for fracture or focal lesion. Sinuses/Orbits: No acute finding.  Left cataract resection. Other: Pervasive motion artifact to a moderate degree. IMPRESSION: Negative motion degraded head CT. Electronically Signed   By: Monte Fantasia M.D.   On: Aug 26, 2020 11:11   DG Chest Portable 1 View  Result Date: 26-Aug-2020 CLINICAL DATA:  Hypoxia.  Reported nasogastric tube placement EXAM: PORTABLE CHEST 1 VIEW COMPARISON:  26-Aug-2020 study obtained earlier in the day FINDINGS: A nasogastric tube is not appreciable on current examination. Endotracheal tube tip is 6.8 cm above the carina. No pneumothorax. Lungs are clear. Heart size and pulmonary vascularity are normal. No adenopathy. There is degenerative change in the thoracic spine. IMPRESSION: Endotracheal tube as described without pneumothorax. No nasogastric tube apparent. Lungs clear. Cardiac silhouette normal. Electronically Signed   By: Lowella Grip III M.D.   On: 08/26/20 12:59   DG Chest Portable 1 View  Result Date: 08/26/2020 CLINICAL  DATA:  Chest pain shortness of breath. Recent COVID-19 diagnosis. EXAM: PORTABLE CHEST 1 VIEW COMPARISON:  03/03/2011 FINDINGS: Midline trachea. Borderline cardiomegaly. No pleural effusion or pneumothorax. Suspect mild left base scarring. Clear right lung. IMPRESSION: Borderline cardiomegaly, without acute disease. Electronically Signed   By: Abigail Miyamoto M.D.   On: Aug 25, 2020 10:24     Nutrition Status:           Indwelling Urinary Catheter continued, requirement due to   Reason to continue Indwelling Urinary Catheter strict Intake/Output monitoring for hemodynamic  instability   Central Line/ continued, requirement due to  Reason to continue Roberta of central venous pressure or other hemodynamic parameters and poor IV access   Ventilator continued, requirement due to severe respiratory failure   Ventilator Sedation RASS 0 to -2      ASSESSMENT AND PLAN SYNOPSIS   Severe ACUTE Hypoxic and Hypercapnic Respiratory Failure due to severe metabolic acidosis due to severe DKA due to severe viral gastroenteritis due to COVID 19 infection complicated by acute renal failure Dx of DKA with diabetic come  -continue Full MV support -continue Bronchodilator Therapy -Wean Fio2 and PEEP as tolerated -VAP/VENT bundle implementation   ACUTE KIDNEY INJURY/Renal Failure -continue Foley Catheter-assess need -Avoid nephrotoxic agents -Follow urine output, BMP -Ensure adequate renal perfusion, optimize oxygenation -Renal dose medications    NEUROLOGY Acute toxic metabolic encephalopathy, need for sedation Goal RASS -2 to -3   CARDIAC ICU monitoring  ID-SEPSIS protocol -continue IV abx as prescibed -follow up cultures  GI GI PROPHYLAXIS as indicated Consider CT abd pelvis  NUTRITIONAL STATUS DIET-->NPO  Constipation protocol as indicated   ENDO-SEVERE DKA with COMA - will use ICU hypoglycemic\Hyperglycemia protocol if needed    ELECTROLYTES -follow labs as needed -replace as needed -pharmacy consultation and following    DVT/GI PRX ordered and assessed TRANSFUSIONS AS NEEDED MONITOR FSBS I Assessed the need for Labs I Assessed the need for Foley I Assessed the need for Central Venous Line Family Discussion when available I Assessed the need for Mobilization I made an Assessment of medications to be adjusted accordingly Safety Risk assessment Completed  CASE DISCUSSED IN MULTIDISCIPLINARY ROUNDS WITH ICU TEAM   Critical Care Time devoted to patient care services described in this note is 65 minutes.    Overall, patient is critically ill, prognosis is guarded.  Patient with Multiorgan failure and at high risk for cardiac arrest and death.    Corrin Parker, M.D.  [facility]  Medical Director [place] Director [facility] [address] Cardio-Pulmonary Department

## 2020-08-13 NOTE — ED Notes (Signed)
Bear hugger put on standby at this time.

## 2020-08-13 NOTE — ED Provider Notes (Signed)
[facility] Provider Note ____________________________________________   Event Date/Time   First MD Initiated Contact with Patient 08/10/2020 [phone]     (approximate)  I have reviewed the triage vital signs and the nursing notes.   HISTORY  Chief Complaint Altered Mental Status  Level 5 caveat: History of present illness limited due to altered mental status  HPI [facility] is a 70 y.o. male with PMH as noted below who presents with altered mental status after he was found unresponsive this morning.  Per EMS he was initially hypoxic although this appears to have resolved.  The patient himself is unable to give any history.   Past Medical History:  Diagnosis Date  . CAD (coronary artery disease)   . Cataract   . CHICKENPOX, HX OF 08/30/2008   Qualifier: Diagnosis of  By: [name], [name]    . Chronic kidney disease   . Diabetes mellitus   . Glaucoma   . History of urinary calculi 08/30/2008   Qualifier: Diagnosis of  By: [name], Jacelyn Pi   . Hyperlipidemia   . Hypertension   . Renal calculus or stone   . Skin cancer of nose    ears and hands    Patient Active Problem List   Diagnosis Date Noted  . COVID-19 08/27/2020  . Uncontrolled type 2 diabetes mellitus without complication, with long-term current use of insulin 10/11/2015  . BPH (benign prostatic hyperplasia) 07/26/2014  . Chronic ankle pain 04/19/2014  . CAD (coronary artery disease) 03/12/2011  . ERECTILE DYSFUNCTION, ORGANIC 08/26/2010  . OSTEOARTHRITIS, CARPOMETACARPAL JOINT, RIGHT THUMB 09/04/2009  . Hyperlipidemia with low HDL 05/02/2009  . Essential hypertension 03/27/2009  . GERD 08/30/2008  . ARTHRITIS 08/30/2008    Past Surgical History:  Procedure Laterality Date  . ANKLE SURGERY     2013/ wears boot cast  . BASAL CELL CARCINOMA EXCISION     hands  . LAMINECTOMY  1983/1988  . Left knee replacement  2002/ 2004  . LITHOTRIPSY     was cancelled/pt  passed stone  . MOHS SURGERY     nose  . TONSILLECTOMY      Prior to Admission medications   Medication Sig Start Date End Date Taking? Authorizing Provider  aspirin 81 MG chewable tablet Chew 81 mg by mouth daily. 07/14/20  Yes [provider]  Cholecalciferol (VITAMIN D3) 250 MCG (10000 UT) capsule Take 10,000 Units by mouth daily.   Yes [provider]  doxycycline (VIBRAMYCIN) 100 MG capsule Take 100 mg by mouth 2 (two) times daily. 08/07/20 08/14/20 Yes [provider]  empagliflozin (JARDIANCE) 25 MG TABS tablet Take 25 mg by mouth daily. 06/30/20  Yes [provider]  finasteride (PROSCAR) 5 MG tablet Take 5 mg by mouth daily.   Yes [provider]  fluticasone (FLONASE) 50 MCG/ACT nasal spray Place 1 spray into both nostrils 2 (two) times daily. 08/02/20  Yes [provider]  glimepiride (AMARYL) 4 MG tablet Take 4 mg by mouth daily. 05/25/20  Yes [provider]  insulin detemir (LEVEMIR) 100 UNIT/ML injection Inject 25 Units into the skin 2 (two) times daily.   Yes [provider]  lisinopril (ZESTRIL) 5 MG tablet Take 5 mg by mouth daily. 07/13/20  Yes [provider]  LUMIGAN 0.01 % SOLN Place 1 drop into both eyes at bedtime.   Yes [provider]  metFORMIN (GLUCOPHAGE) 500 MG tablet Take 1,000 mg by mouth 2 (two)  times daily. 06/25/20  Yes [provider]  Pitavastatin Calcium 4 MG TABS Take 4 mg by mouth daily. 05/22/20  Yes [provider]  tamsulosin (FLOMAX) 0.4 MG CAPS capsule Take 0.4 mg by mouth daily after supper.  04/16/14  Yes [provider]  zinc gluconate 50 MG tablet Take 50 mg by mouth daily.   Yes [provider]    Allergies Demerol, Meperidine, Meperidine hcl, Nitrospan [nitroglycerin], Sulfa antibiotics, and Sulfasalazine  Family History  Problem Relation Age of Onset  . Heart disease Brother        Atrial fibrillation  . Heart  disease Father        CHF  . Heart disease Mother   . Heart disease Brother        atrial fib  . Cancer Sister        bone    Social History Social History   Tobacco Use  . Smoking status: Never Smoker  . Smokeless tobacco: Former Network engineer Use Topics  . Alcohol use: No  . Drug use: No    Review of Systems Level 5 caveat: Unable to obtain review of systems due to altered mental status    ____________________________________________   PHYSICAL EXAM:  VITAL SIGNS: ED Triage Vitals  Enc Vitals Group     BP 08/25/2020 0946 121/64     Pulse Rate 08/18/2020 0946 (!) 141     Resp 08/09/2020 0946 (!) 30     Temp 08/29/2020 1013 (!) 91 F (32.8 C)     Temp Source 08/14/2020 1013 Rectal     SpO2 08/18/2020 0946 98 %     Weight 08/19/2020 0947 200 lb (90.7 kg)     Height 09/02/2020 0947 6\' 4"  (1.93 m)     Head Circumference --      Peak Flow --      Pain Score --      Pain Loc --      Pain Edu? --      Excl. in [place]? --     Constitutional: Somnolent, opening eyes, moving purposefully but otherwise unresponsive. Eyes: Conjunctivae are normal.  EOMI.  PERRLA. Head: Atraumatic. Nose: No congestion/rhinnorhea. Mouth/Throat: Mucous membranes are dry.   Neck: Normal range of motion.  Cardiovascular: Tachycardic, irregular rhythm. Grossly normal heart sounds.  Good peripheral circulation. Respiratory: Increased respiratory effort.  No retractions. Lungs CTAB. Gastrointestinal: Soft and nontender. No distention.  Genitourinary: No flank tenderness. Musculoskeletal: No lower extremity edema.  Extremities warm and well perfused.  Neurologic: Motor intact in all extremities, spontaneous purposeful movements. Skin:  Skin is cool and dry. No rash noted. Psychiatric: Unable to assess.  ____________________________________________   LABS (all labs ordered are listed, but only abnormal results are displayed)  Labs Reviewed  BLOOD GAS, VENOUS - Abnormal; Notable for the following  components:      Result Value   pH, Ven <6.900 (*)    pCO2, Ven 22 (*)    pO2, Ven 48.0 (*)    All other components within normal limits  LACTIC ACID, PLASMA - Abnormal; Notable for the following components:   Lactic Acid, Venous 4.4 (*)    All other components within normal limits  LACTIC ACID, PLASMA - Abnormal; Notable for the following components:   Lactic Acid, Venous 3.8 (*)    All other components within normal limits  COMPREHENSIVE METABOLIC PANEL - Abnormal; Notable for the following components:   CO2 <7 (*)    Glucose, Bld 487 (*)  BUN 51 (*)    Creatinine, Ser 2.01 (*)    Total Bilirubin 1.9 (*)    GFR, Estimated 35 (*)    All other components within normal limits  CBC WITH DIFFERENTIAL/PLATELET - Abnormal; Notable for the following components:   WBC 23.2 (*)    HCT 53.9 (*)    Platelets 514 (*)    Neutro Abs 16.1 (*)    Monocytes Absolute 1.9 (*)    Abs Immature Granulocytes 2.58 (*)    All other components within normal limits  PROTIME-INR - Abnormal; Notable for the following components:   Prothrombin Time 15.4 (*)    INR 1.3 (*)    All other components within normal limits  URINALYSIS, COMPLETE (UACMP) WITH MICROSCOPIC - Abnormal; Notable for the following components:   Color, Urine YELLOW (*)    APPearance HAZY (*)    Glucose, UA >=500 (*)    Hgb urine dipstick LARGE (*)    Ketones, ur 80 (*)    Protein, ur 100 (*)    Bacteria, UA RARE (*)    All other components within normal limits  BLOOD GAS, ARTERIAL - Abnormal; Notable for the following components:   pH, Arterial <6.900 (*)    pCO2 arterial 25 (*)    pO2, Arterial 225 (*)    Bicarbonate 4.2 (*)    Acid-base deficit 29.3 (*)    All other components within normal limits  POC SARS CORONAVIRUS 2 AG -  ED - Abnormal; Notable for the following components:   SARS Coronavirus 2 Ag POSITIVE (*)    All other components within normal limits  TROPONIN I (HIGH SENSITIVITY) - Abnormal; Notable for the  following components:   Troponin I (High Sensitivity) 19 (*)    All other components within normal limits  TROPONIN I (HIGH SENSITIVITY) - Abnormal; Notable for the following components:   Troponin I (High Sensitivity) 19 (*)    All other components within normal limits  CULTURE, BLOOD (ROUTINE X 2)  CULTURE, BLOOD (ROUTINE X 2)  URINE CULTURE  HIV ANTIBODY (ROUTINE TESTING W REFLEX)  CBC  CREATININE, SERUM   ____________________________________________  EKG  ED ECG REPORT I, Arta Silence, the attending physician, personally viewed and interpreted this ECG.  Date: 2020/09/04 EKG Time: 0949 Rate: 148 Rhythm: Atrial fibrillation QRS Axis: Right axis Intervals: normal ST/T Wave abnormalities: Nonspecific abnormalities Narrative Interpretation: Nonspecific abnormalities with no evidence of acute ischemia  ____________________________________________  RADIOLOGY  Chest x-ray interpreted by me shows no focal infiltrate or edema CT head: No ICH or other acute abnormality ____________________________________________   PROCEDURES  Procedure(s) performed: Yes  Procedure Name: Intubation Date/Time: Sep 04, 2020 12:53 PM Performed by: Arta Silence, MD Pre-anesthesia Checklist: Patient identified, Patient being monitored, Emergency Drugs available, Timeout performed and Suction available Oxygen Delivery Method: Ambu bag Preoxygenation: Pre-oxygenation with 100% oxygen Induction Type: Rapid sequence Laryngoscope Size: Glidescope Tube size: 7.5 mm Number of attempts: 1 Placement Confirmation: ETT inserted through vocal cords under direct vision,  CO2 detector and Breath sounds checked- equal and bilateral Secured at: 24 cm Tube secured with: ETT holder       Critical Care performed: Yes  CRITICAL CARE Performed by: Arta Silence   Total critical care time: 60 minutes  Critical care time was exclusive of separately billable procedures and treating  other patients.  Critical care was necessary to treat or prevent imminent or life-threatening deterioration.  Critical care was time spent personally by me on the following activities: development of treatment  plan with patient and/or surrogate as well as nursing, discussions with consultants, evaluation of patient's response to treatment, examination of patient, obtaining history from patient or surrogate, ordering and performing treatments and interventions, ordering and review of laboratory studies, ordering and review of radiographic studies, pulse oximetry and re-evaluation of patient's condition. ____________________________________________   INITIAL IMPRESSION / ASSESSMENT AND PLAN / ED COURSE  Pertinent labs & imaging results that were available during my care of the patient were reviewed by me and considered in my medical decision making (see chart for details).  70 year old male with PMH as noted above including hypertension, CAD, diabetes who presents after he was found unresponsive this morning.  The patient himself is unable to give any history.  Per EMS, the patient was initially hypoxic although this resolved after he was briefly put on a nonrebreather.  I reviewed the past medical records in epic and [place].  The patient has had no ED visits or admissions here.  The patient had an outpatient visit on 12/30 at which time he presented with dysuria and frequency as well as URI symptoms, cough, and fever.  He tested negative for COVID at that time and was started on a Z-Pak.  Subsequently the patient's daughter arrived and is able to give additional history.  She is an Haematologist.  She states that he initially developed the symptoms a few days after Christmas.  In addition to the azithromycin, he was taking ivermectin.  He had decreased appetite and weakness and received an outpatient IV fluid infusion a few days ago.  On arrival to the ED, the patient was weak and somnolent  appearing although mostly with eyes open.  He had increased work of breathing but no acute respiratory distress.  He is tachycardic, hypothermic, but within normal blood pressure.  He has increased respiratory effort but O2 saturation on room air is in the high 90s.  Lungs are clear bilaterally.  Mucous membranes are dry.  The patient is moving all of his extremities.  Differential is extremely broad but includes acute infection/sepsis, respiratory failure causing hypercapnia and altered mental status, intracranial hemorrhage or other acute CNS cause, or less likely cardiac etiology.  I have ordered fluids and antibiotics for presumed sepsis.  Initially I considered whether the patient may need advanced airway management given his increased work of breathing and altered mental status.  However, he is currently maintaining his airway and is oxygenating well on room air.  There is no indication for intubation at this time.  ----------------------------------------- 11:21 AM on 08/11/2020 -----------------------------------------  Lab work-up reveals severe acidosis.  Lactate is elevated, as is the WBC count.  The patient is COVID-positive.  However overall presentation is still consistent with sepsis.  I have ordered fluids per the sepsis protocol as well as a dose of IV sodium bicarb given the severe acidosis.  The patient has been placed under a warming blanket.  He is still maintaining his airway and O2 saturation is 100% on room air.  ----------------------------------------- 12:51 PM on 08/31/2020 -----------------------------------------  Although the patient's O2 saturation has remained 100%, his breathing has become more shallow and labored appearing.  His mental status has not improved at all.  He is still significantly hypothermic.    I discussed the case with Dr. Mortimer Fries from the ICU for admission.  He recommends intubation given the severe metabolic disturbance and persistent altered  mental status.  I discussed this with the daughter at the bedside and she agrees.  The  patient was successfully intubated on the first attempt without any complications.  We will put him on a propofol drip for sedation.  He is continue to receive fluids.  Plan will be ICU admission.  ____________________________  [facility] was evaluated in Emergency Department on 08/24/2020 for the symptoms described in the history of present illness. He was evaluated in the context of the global COVID-19 pandemic, which necessitated consideration that the patient might be at risk for infection with the SARS-CoV-2 virus that causes COVID-19. Institutional protocols and algorithms that pertain to the evaluation of patients at risk for COVID-19 are in a state of rapid change based on information released by regulatory bodies including the CDC and federal and state organizations. These policies and algorithms were followed during the patient's care in the ED.  ____________________________________________   FINAL CLINICAL IMPRESSION(S) / ED DIAGNOSES  Final diagnoses:  Sepsis, due to unspecified organism, unspecified whether acute organ dysfunction present ([place])  Metabolic acidosis      NEW MEDICATIONS STARTED DURING THIS VISIT:  New Prescriptions   No medications on file     Note:  This document was prepared using Dragon voice recognition software and may include unintentional dictation errors.    Arta Silence, MD 08/19/2020 [phone]

## 2020-08-13 NOTE — ED Notes (Signed)
FAMILY REFUSES REMDESIVIR

## 2020-08-13 NOTE — ED Notes (Addendum)
Care assumed of pt at this time. Pt is responsive to pain only. Pt noted to have agonal guppy respirations. Pt noted to be tachy Afib. Pt has bear hugger on at this time on highest setting.

## 2020-08-13 NOTE — Procedures (Signed)
Central Venous Catheter Insertion Procedure Note  [facility]  [id number]  30-Oct-1950  Date:08/22/2020  Time:9:29 PM   Provider Performing:Farrie Sann D Dewaine Conger   Procedure: Insertion of Non-tunneled Central Venous [phone]) with US guidance (71062)   Indication(s) Medication administration and Difficult access  Consent Risks of the procedure as well as the alternatives and risks of each were explained to the patient and/or caregiver.  Consent for the procedure was obtained and is signed in the bedside chart  Anesthesia Topical only with 1% lidocaine   Timeout Verified patient identification, verified procedure, site/side was marked, verified correct patient position, special equipment/implants available, medications/allergies/relevant history reviewed, required imaging and test results available.  Sterile Technique Maximal sterile technique including full sterile barrier drape, hand hygiene, sterile gown, sterile gloves, mask, hair covering, sterile ultrasound probe cover (if used).  Procedure Description Area of catheter insertion was cleaned with chlorhexidine and draped in sterile fashion.  With real-time ultrasound guidance a central venous catheter was placed into the right femoral vein. Nonpulsatile blood flow and easy flushing noted in all ports.  The catheter was sutured in place and sterile dressing applied.  Complications/Tolerance None; patient tolerated the procedure well. Chest X-ray is ordered to verify placement for internal jugular or subclavian cannulation.   Chest x-ray is not ordered for femoral cannulation.  EBL Minimal  Specimen(s) None    Line secured at the 20 cm mark.  BIOPATCH applied to the insertion site.     Darel Hong, AGACNP-BC [facility] Pager: [phone]

## 2020-08-13 NOTE — ED Notes (Signed)
Darlyn Chamber NP at bedside to insert central line catheter to right groin.

## 2020-08-13 NOTE — Progress Notes (Signed)
CODE SEPSIS - PHARMACY COMMUNICATION  **Broad Spectrum Antibiotics should be administered within 1 hour of Sepsis diagnosis**  Time Code Sepsis Called/Page Received: 1610  Antibiotics Ordered: Cefepime + vancomycin  Time of 1st antibiotic administration: 1001  Additional action taken by pharmacy: N/A  Benita Gutter 08/29/2020  9:47 AM

## 2020-08-13 NOTE — ED Notes (Signed)
Pt prepped for RSI at this time. Daughter at bedside and gives consent.

## 2020-08-13 NOTE — ED Notes (Signed)
FSBS-453 Per Endotool Insulin drip increased to 30units.

## 2020-08-13 NOTE — ED Notes (Signed)
Time out performed for central line. Daughter at bedside signed consent. Correct procedure, pt and site. Verified with all in room.

## 2020-08-13 NOTE — ED Notes (Signed)
Date and time results received: 08/25/2020 10:32 AM   Test: Lactic  Critical Value: 4.4  Name of Provider Notified: Dr. Cherylann Banas  Orders Received? Or Actions Taken?: receiving abx and fluids

## 2020-08-13 NOTE — Sepsis Progress Note (Signed)
Sepsis protocol is being monitored by eLink. 

## 2020-08-13 NOTE — ED Notes (Signed)
FSBS-405. Per Endo tool insulin drip was decreased to 28 units.

## 2020-08-13 NOTE — ED Triage Notes (Signed)
Pt to ER via EMS from home with cc unresponsive.  Pt was recently treated for COVID and is "outsie the quarantine window".  Pt is being treated for UTI, but is not taking abx as prescribed due to lethargy.  Pt was responsive to family last night and was found this AM unresponsive.  PT arrives responsive to painful stimuli with some spontaneous purposeful movement.  Dr. Cherylann Banas at bedside.

## 2020-08-13 NOTE — ED Notes (Signed)
FSBS-483 Endo tool used and dose will remain at 28 units.

## 2020-08-14 ENCOUNTER — Inpatient Hospital Stay: Payer: HMO

## 2020-08-14 DIAGNOSIS — J9601 Acute respiratory failure with hypoxia: Secondary | ICD-10-CM | POA: Diagnosis not present

## 2020-08-14 DIAGNOSIS — U071 COVID-19: Secondary | ICD-10-CM | POA: Diagnosis not present

## 2020-08-14 DIAGNOSIS — E0811 Diabetes mellitus due to underlying condition with ketoacidosis with coma: Secondary | ICD-10-CM | POA: Diagnosis not present

## 2020-08-14 LAB — BASIC METABOLIC PANEL
Anion gap: 13 (ref 5–15)
Anion gap: 12 (ref 5–15)
Anion gap: 10 (ref 5–15)
BUN: 62 mg/dL — ABNORMAL HIGH (ref 8–23)
BUN: 63 mg/dL — ABNORMAL HIGH (ref 8–23)
BUN: 67 mg/dL — ABNORMAL HIGH (ref 8–23)
CO2: 17 mmol/L — ABNORMAL LOW (ref 22–32)
CO2: 21 mmol/L — ABNORMAL LOW (ref 22–32)
CO2: 23 mmol/L (ref 22–32)
Calcium: 7.8 mg/dL — ABNORMAL LOW (ref 8.9–10.3)
Calcium: 7.7 mg/dL — ABNORMAL LOW (ref 8.9–10.3)
Calcium: 6.7 mg/dL — ABNORMAL LOW (ref 8.9–10.3)
Chloride: 113 mmol/L — ABNORMAL HIGH (ref 98–111)
Chloride: 111 mmol/L (ref 98–111)
Chloride: 107 mmol/L (ref 98–111)
Creatinine, Ser: 2.76 mg/dL — ABNORMAL HIGH (ref 0.61–1.24)
Creatinine, Ser: 3.14 mg/dL — ABNORMAL HIGH (ref 0.61–1.24)
Creatinine, Ser: 3.63 mg/dL — ABNORMAL HIGH (ref 0.61–1.24)
GFR, Estimated: 24 mL/min — ABNORMAL LOW (ref >60)
GFR, Estimated: 21 mL/min — ABNORMAL LOW (ref >60)
GFR, Estimated: 17 mL/min — ABNORMAL LOW (ref >60)
Glucose, Bld: 316 mg/dL — ABNORMAL HIGH (ref 70–99)
Glucose, Bld: 180 mg/dL — ABNORMAL HIGH (ref 70–99)
Glucose, Bld: 204 mg/dL — ABNORMAL HIGH (ref 70–99)
Potassium: 3.1 mmol/L — ABNORMAL LOW (ref 3.5–5.1)
Potassium: 3.4 mmol/L — ABNORMAL LOW (ref 3.5–5.1)
Potassium: 4.4 mmol/L (ref 3.5–5.1)
Sodium: 143 mmol/L (ref 135–145)
Sodium: 144 mmol/L (ref 135–145)
Sodium: 140 mmol/L (ref 135–145)

## 2020-08-14 LAB — CBC
HCT: 40.9 % (ref 39.0–52.0)
Hemoglobin: 14 g/dL (ref 13.0–17.0)
MCH: 30.2 pg (ref 26.0–34.0)
MCHC: 34.2 g/dL (ref 30.0–36.0)
MCV: 88.1 fL (ref 80.0–100.0)
Platelets: 257 10*3/uL (ref 150–400)
RBC: 4.64 MIL/uL (ref 4.22–5.81)
RDW: 14.7 % (ref 11.5–15.5)
WBC: 34.7 10*3/uL — ABNORMAL HIGH (ref 4.0–10.5)
nRBC: 0 % (ref 0.0–0.2)

## 2020-08-14 LAB — PROCALCITONIN
Procalcitonin: 3.36 ng/mL
Procalcitonin: 3 ng/mL

## 2020-08-14 LAB — TRIGLYCERIDES: Triglycerides: 413 mg/dL — ABNORMAL HIGH (ref <150)

## 2020-08-14 LAB — BLOOD GAS, ARTERIAL
Acid-base deficit: 9.2 mmol/L — ABNORMAL HIGH (ref 0.0–2.0)
Acid-base deficit: 2.7 mmol/L — ABNORMAL HIGH (ref 0.0–2.0)
Bicarbonate: 19.4 mmol/L — ABNORMAL LOW (ref 20.0–28.0)
Bicarbonate: 22.6 mmol/L (ref 20.0–28.0)
FIO2: 0.35
FIO2: 0.35
MECHVT: 450 mL
MECHVT: 450 mL
Mechanical Rate: 22
O2 Saturation: 95.5 %
O2 Saturation: 98.2 %
PEEP: 5 cmH2O
PEEP: 5 cmH2O
Patient temperature: 37
Patient temperature: 37
RATE: 18 resp/min
RATE: 22 resp/min
pCO2 arterial: 52 mmHg — ABNORMAL HIGH (ref 32.0–48.0)
pCO2 arterial: 40 mmHg (ref 32.0–48.0)
pH, Arterial: 7.18 — CL (ref 7.350–7.450)
pH, Arterial: 7.36 (ref 7.350–7.450)
pO2, Arterial: 97 mmHg (ref 83.0–108.0)
pO2, Arterial: 112 mmHg — ABNORMAL HIGH (ref 83.0–108.0)

## 2020-08-14 LAB — CBG MONITORING, ED
Glucose-Capillary: 405 mg/dL — ABNORMAL HIGH (ref 70–99)
Glucose-Capillary: 322 mg/dL — ABNORMAL HIGH (ref 70–99)
Glucose-Capillary: 251 mg/dL — ABNORMAL HIGH (ref 70–99)
Glucose-Capillary: 320 mg/dL — ABNORMAL HIGH (ref 70–99)
Glucose-Capillary: 220 mg/dL — ABNORMAL HIGH (ref 70–99)
Glucose-Capillary: 191 mg/dL — ABNORMAL HIGH (ref 70–99)
Glucose-Capillary: 161 mg/dL — ABNORMAL HIGH (ref 70–99)

## 2020-08-14 LAB — URINE CULTURE: Culture: NO GROWTH

## 2020-08-14 LAB — FERRITIN: Ferritin: 523 ng/mL — ABNORMAL HIGH (ref 24–336)

## 2020-08-14 LAB — MAGNESIUM: Magnesium: 2.5 mg/dL — ABNORMAL HIGH (ref 1.7–2.4)

## 2020-08-14 LAB — GLUCOSE, CAPILLARY
Glucose-Capillary: 151 mg/dL — ABNORMAL HIGH (ref 70–99)
Glucose-Capillary: 151 mg/dL — ABNORMAL HIGH (ref 70–99)
Glucose-Capillary: 145 mg/dL — ABNORMAL HIGH (ref 70–99)
Glucose-Capillary: 145 mg/dL — ABNORMAL HIGH (ref 70–99)
Glucose-Capillary: 240 mg/dL — ABNORMAL HIGH (ref 70–99)
Glucose-Capillary: 243 mg/dL — ABNORMAL HIGH (ref 70–99)

## 2020-08-14 LAB — D-DIMER, QUANTITATIVE: D-Dimer, Quant: 8.32 ug/mL-FEU — ABNORMAL HIGH (ref 0.00–0.50)

## 2020-08-14 LAB — C-REACTIVE PROTEIN: CRP: 20.3 mg/dL — ABNORMAL HIGH (ref <1.0)

## 2020-08-14 LAB — PHOSPHORUS
Phosphorus: 1.6 mg/dL — ABNORMAL LOW (ref 2.5–4.6)
Phosphorus: 5.3 mg/dL — ABNORMAL HIGH (ref 2.5–4.6)

## 2020-08-14 LAB — BETA-HYDROXYBUTYRIC ACID: Beta-Hydroxybutyric Acid: 3.21 mmol/L — ABNORMAL HIGH (ref 0.05–0.27)

## 2020-08-14 LAB — HIV ANTIBODY (ROUTINE TESTING W REFLEX): HIV Screen 4th Generation wRfx: NONREACTIVE

## 2020-08-14 IMAGING — DX DG CHEST 1V PORT
1 series · 1 of 1 positions shown · non-contrast
Comparison: [DATE]

CLINICAL DATA: Endotracheal tube placement

EXAM:
PORTABLE CHEST 1 VIEW

[chest ap]
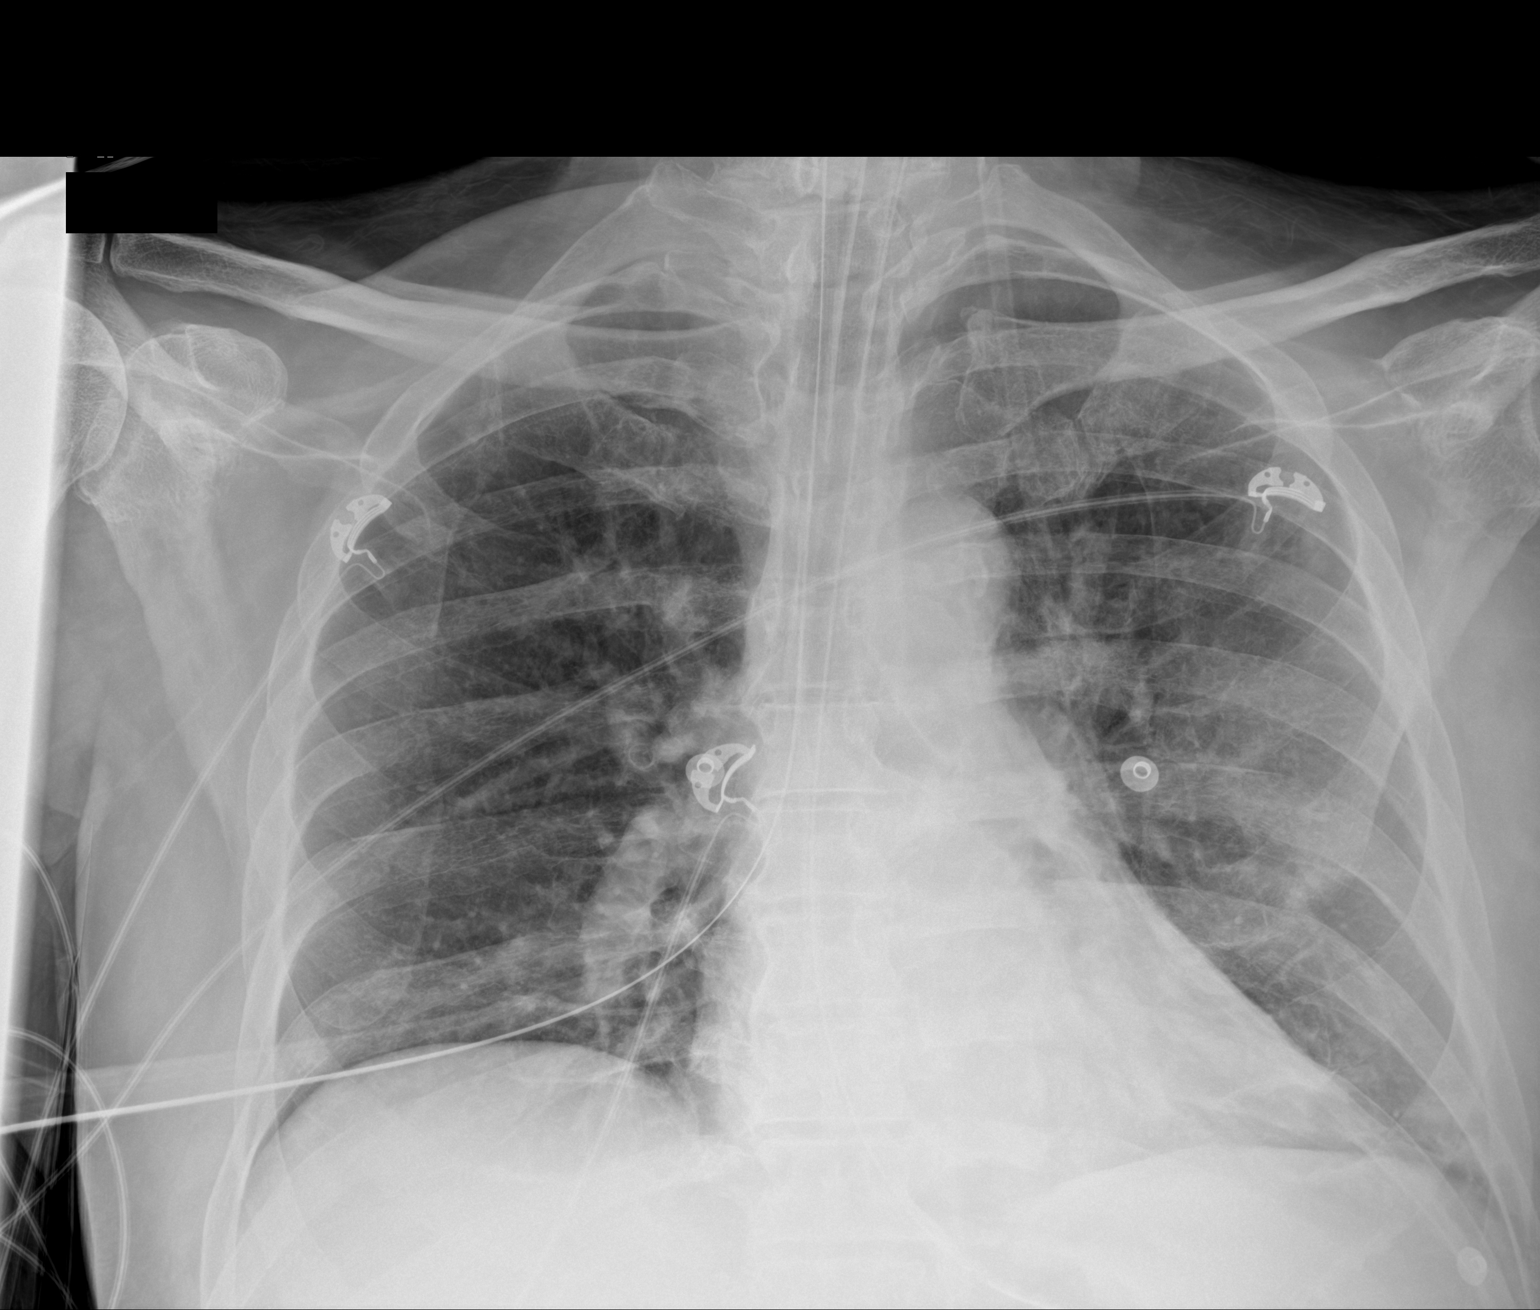

[1 of 1 positions shown; findings below may reference images not displayed]

FINDINGS: The endotracheal tube terminates approximately 6 cm above the
carina. The enteric tube extends below the left hemidiaphragm. There
is no definite pneumothorax or large pleural effusion. There are
increasing airspace opacities at the left lung base and in the
retrocardiac region. The heart size is stable. Aortic calcifications
are noted.
IMPRESSION: 1. Lines and tubes as above.
2. Increasing airspace opacities at the left lung base and within
the retrocardiac region suspicious for atelectasis or developing
pneumonia.

## 2020-08-14 IMAGING — US US RENAL
1 series · 14 of 25 positions shown · non-contrast
Comparison: Ultrasound kidneys [DATE]

MRI abdomen [DATE]

CLINICAL DATA: Acute renal failure

EXAM:
RENAL / URINARY TRACT ULTRASOUND COMPLETE

[Series 1: us renal · 0.27mm/px · 14 of 36 slices shown]
[im 1/36]
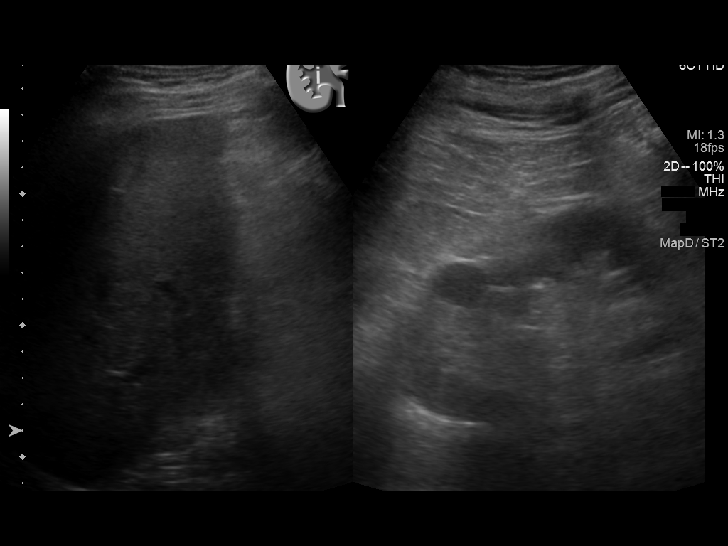
[im 3/36]
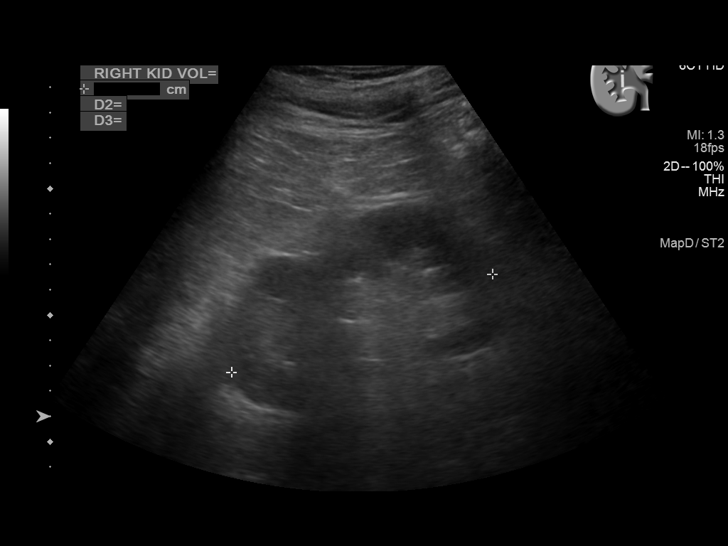
[im 6/36]
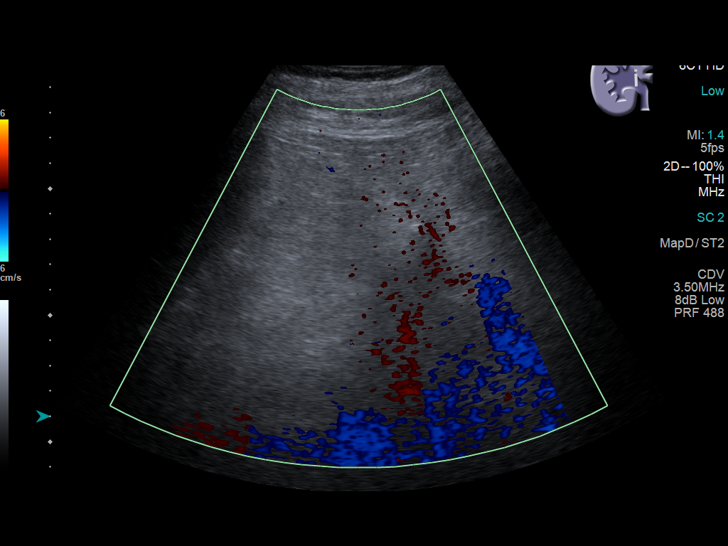
[im 9/36]
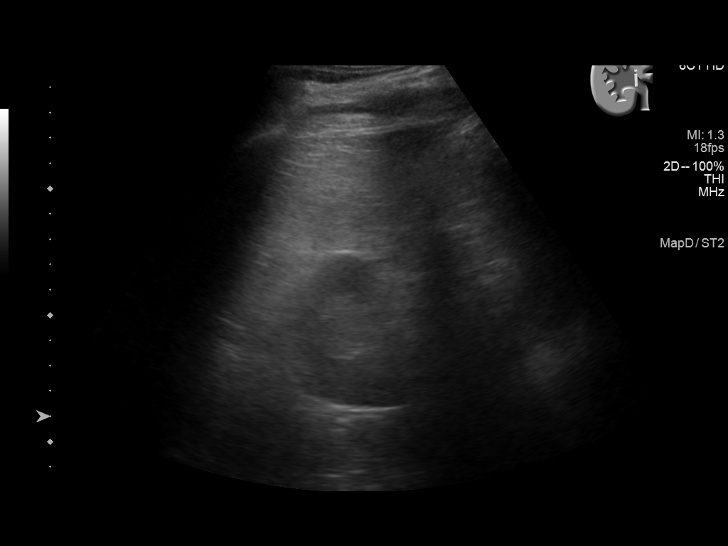
[im 12/36]
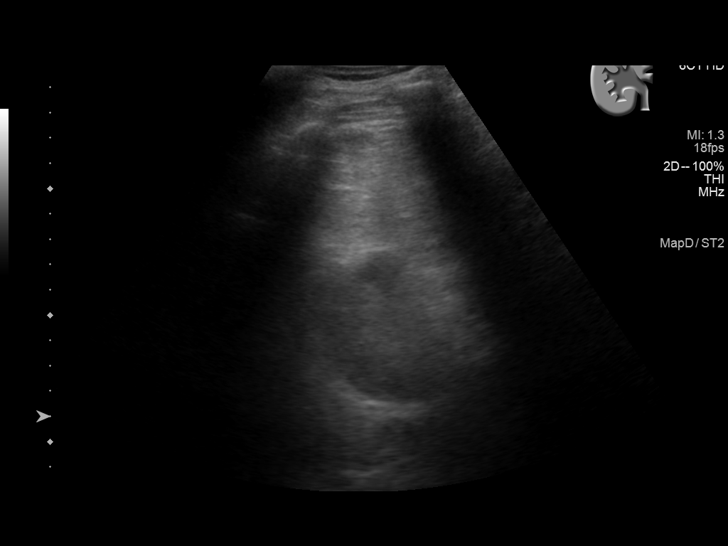
[im 14/36]
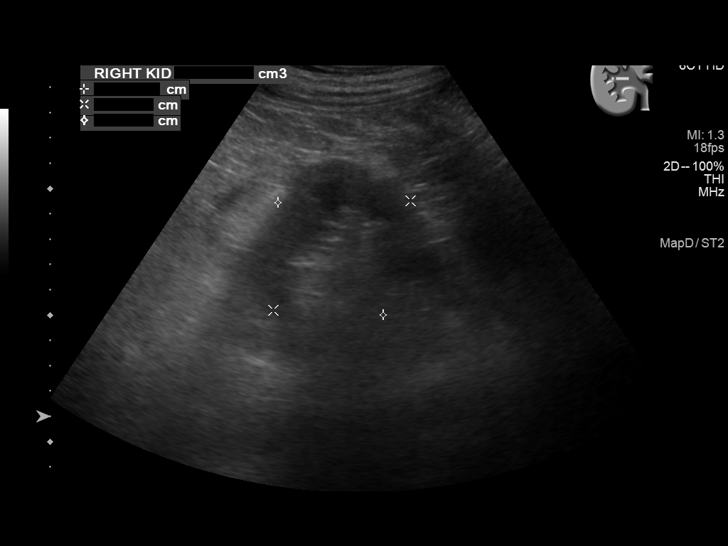
[im 17/36]
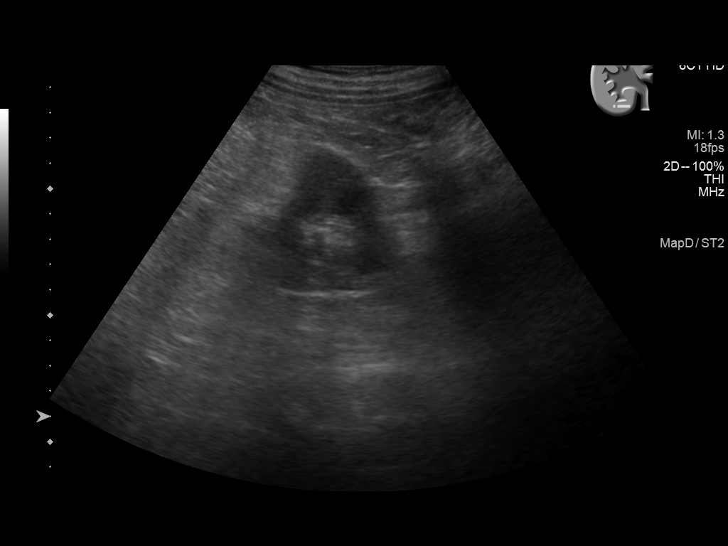
[im 19/36]
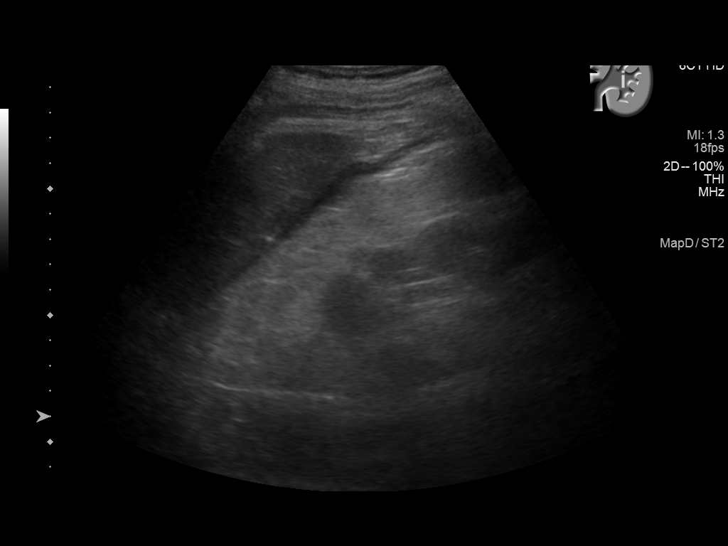
[im 22/36]
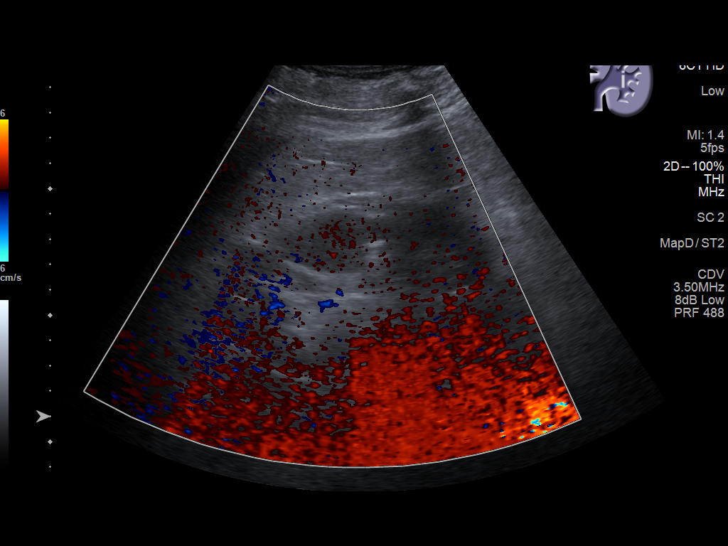
[im 24/36]
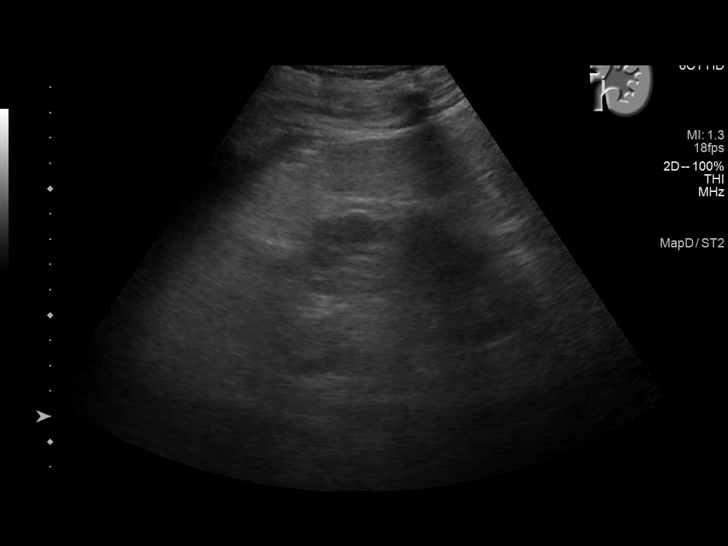
[im 27/36]
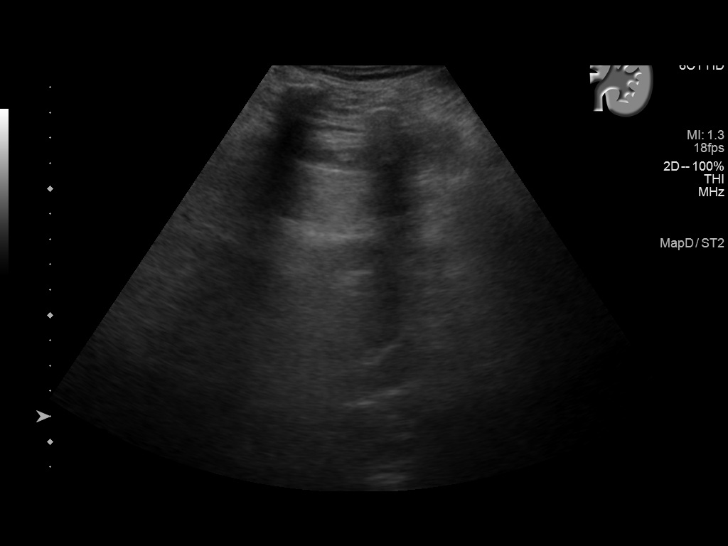
[im 30/36]
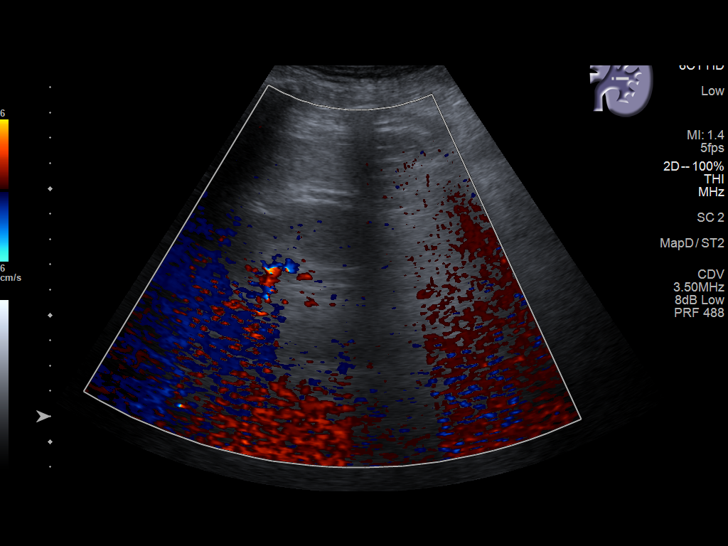
[im 33/36]
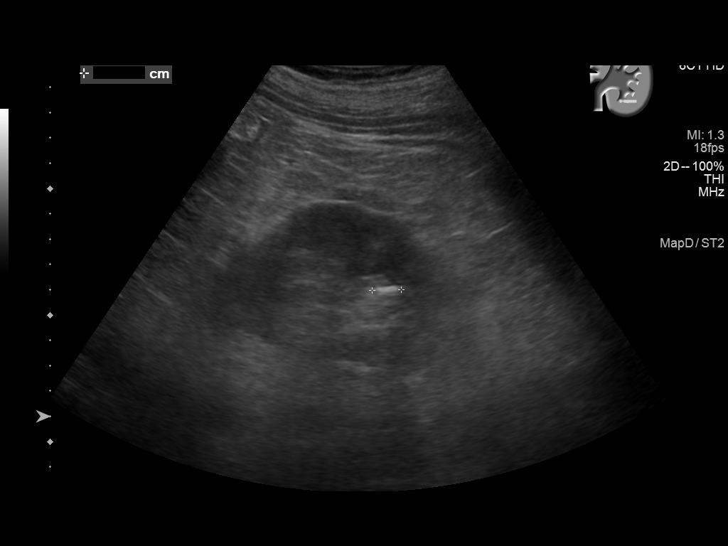
[im 36/36]
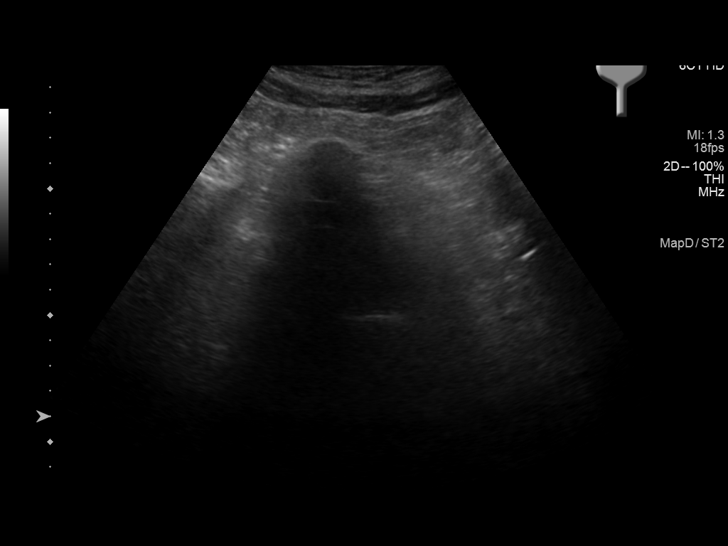

[14 of 25 positions shown; findings below may reference images not displayed]

FINDINGS: Right Kidney:

Renal measurements: 11.0 x 6.9 x 6.1 = volume: 243 mL. 2.3 cm
hypoechoic structure in the anterior right kidney likely a simple
cyst given appearance on recent MRI. Echogenicity within normal
limits. No suspicious mass or hydronephrosis visualized.

Left Kidney:

Renal measurements: 11.5 x 6.7 x 1.5 = volume: 302 mL. Echogenicity
within normal limits. No mass or hydronephrosis visualized. 12 mm
linear echogenicity in the lower pole suspicious for nonobstructing
calculus.

Bladder:

Unable to evaluate as it is collapsed around a Foley catheter
balloon.

Other:

None.
IMPRESSION: 1. No hydronephrosis.
2. Nonobstructing left renal calculus.

## 2020-08-14 MED ORDER — ORAL CARE MOUTH RINSE
15.0000 mL | OROMUCOSAL | Status: DC
  Administered 2020-08-14 – 2020-08-30 (×153): 15 mL via OROMUCOSAL

## 2020-08-14 MED ORDER — SODIUM CHLORIDE 0.9 % IV SOLN
2.0000 g | INTRAVENOUS | Status: DC
  Administered 2020-08-15 – 2020-08-16 (×2): 2 g via INTRAVENOUS
  Filled 2020-08-14 (×3): qty 2

## 2020-08-14 MED ORDER — MIDAZOLAM 50MG/50ML (1MG/ML) PREMIX INFUSION: 0.5000 mg/h | INTRAVENOUS | Status: DC

## 2020-08-14 MED ORDER — FAMOTIDINE IN NACL 20-0.9 MG/50ML-% IV SOLN
20.0000 mg | INTRAVENOUS | Status: DC
  Administered 2020-08-15 – 2020-08-30 (×16): 20 mg via INTRAVENOUS
  Filled 2020-08-14 (×16): qty 50

## 2020-08-14 MED ORDER — POTASSIUM CHLORIDE 10 MEQ/50ML IV SOLN
10.0000 meq | INTRAVENOUS | Status: AC
  Administered 2020-08-14 (×2): 10 meq via INTRAVENOUS
  Filled 2020-08-14 (×8): qty 50

## 2020-08-14 MED ORDER — SODIUM CHLORIDE 0.9 % IV SOLN
2.0000 g | Freq: Two times a day (BID) | INTRAVENOUS | Status: DC
  Administered 2020-08-14: 2 g via INTRAVENOUS
  Filled 2020-08-14: qty 2

## 2020-08-14 MED ORDER — CHLORHEXIDINE GLUCONATE 0.12% ORAL RINSE (MEDLINE KIT)
15.0000 mL | Freq: Two times a day (BID) | OROMUCOSAL | Status: DC
  Administered 2020-08-14 – 2020-08-30 (×32): 15 mL via OROMUCOSAL

## 2020-08-14 MED ORDER — CHLORHEXIDINE GLUCONATE CLOTH 2 % EX PADS
6.0000 | MEDICATED_PAD | Freq: Every day | CUTANEOUS | Status: DC
  Administered 2020-08-14 – 2020-08-30 (×17): 6 via TOPICAL

## 2020-08-14 MED ORDER — METHYLPREDNISOLONE SODIUM SUCC 125 MG IJ SOLR
80.0000 mg | Freq: Two times a day (BID) | INTRAMUSCULAR | Status: DC
  Administered 2020-08-14 – 2020-08-17 (×7): 80 mg via INTRAVENOUS
  Filled 2020-08-14 (×7): qty 2

## 2020-08-14 MED ORDER — POTASSIUM PHOSPHATES 15 MMOLE/5ML IV SOLN
45.0000 mmol | Freq: Once | INTRAVENOUS | Status: AC
  Administered 2020-08-14: 45 mmol via INTRAVENOUS
  Filled 2020-08-14: qty 15

## 2020-08-14 MED ORDER — SODIUM BICARBONATE 8.4 % IV SOLN
INTRAVENOUS | Status: DC
  Filled 2020-08-14: qty 150
  Filled 2020-08-14 (×5): qty 850

## 2020-08-14 MED ORDER — LACTATED RINGERS IV SOLN: INTRAVENOUS | Status: DC

## 2020-08-14 MED ORDER — INSULIN DETEMIR 100 UNIT/ML ~~LOC~~ SOLN
38.0000 [IU] | Freq: Two times a day (BID) | SUBCUTANEOUS | Status: DC
  Administered 2020-08-14 (×2): 38 [IU] via SUBCUTANEOUS
  Filled 2020-08-14 (×3): qty 0.38

## 2020-08-14 MED ORDER — INSULIN ASPART 100 UNIT/ML ~~LOC~~ SOLN
2.0000 [IU] | SUBCUTANEOUS | Status: DC
  Administered 2020-08-14 (×2): 6 [IU] via SUBCUTANEOUS
  Administered 2020-08-14: 2 [IU] via SUBCUTANEOUS
  Filled 2020-08-14 (×3): qty 1

## 2020-08-14 NOTE — Progress Notes (Signed)
Pharmacy Antibiotic Note  Nathaniel Thomas is a 70 y.o. male admitted on 09/03/2020 with sepsis in setting of COVID-19 infection. There is concern for superimposed bacterial infection. Patient further with severe DKA. Patient was intubated 1/10 and remains sedated on mechanical ventilation. He is hypotensive requiring blood pressure support with Levophed. Pharmacy has been consulted for cefepime dosing.  Plan:  Modify cefepime to 2 g IV q24h (CrCl < 30 mL/min)  Continue to monitor renal function and adjust antibiotics as indicated. Continue to follow-up culture data.  Height: 6\' 4"  (193 cm) Weight: 90.7 kg (200 lb) IBW/kg (Calculated) : 86.8  Temp (24hrs), Avg:96.1 F (35.6 C), Min:89.2 F (31.8 C), Max:99.9 F (37.7 C)  Recent Labs  Lab 08/22/2020 0950 08/07/2020 1136 08/04/2020 1615 08/14/20 0300 08/14/20 0957  WBC 23.2*  --  28.2*  --  34.7*  CREATININE 2.01*  --  2.15* 2.76* 3.14*  LATICACIDVEN 4.4* 3.8*  --   --   --     Estimated Creatinine Clearance: 26.9 mL/min (A) (by C-G formula based on SCr of 3.14 mg/dL (H)).    Allergies  Allergen Reactions  . Demerol Other (See Comments)    Blood pressure dropped completely out  . Meperidine Other (See Comments)    Drops blood pressure out  Blood pressure dropped completely out  . Meperidine Hcl Other (See Comments)    Drops blood pressure out  . Nitrospan [Nitroglycerin] Other (See Comments)    Blood pressure dropped out  . Sulfa Antibiotics Other (See Comments)    Urinary burning; blisters   . Sulfasalazine Other (See Comments)    Urinary burning; blisters     Antimicrobials this admission: Vancomycin 1/10 x 1  Cefepime 1/10 >>   Dose adjustments this admission: 1/11: Cefepime modified from 2 g IV q12h to 2 g IV q24h based on worsening renal function  Microbiology results: 1/10 BCx: NGTD 1/10 UCx: pending 1/11 Tracheal aspirate: pending   1/11 MRSA PCR: pending  Thank you for allowing pharmacy to be a part of this  patient's care.  Benita Gutter 08/14/2020 10:32 AM

## 2020-08-14 NOTE — Progress Notes (Signed)
Assisted tele visit to patient with family member.  Galaxy Borden R, RN  

## 2020-08-14 NOTE — Progress Notes (Signed)
Attempted video chat for pts wife. Unable to connect. Apparently problem with system, Ticket submitted for IT. Thank you,

## 2020-08-14 NOTE — Consult Note (Signed)
[address] [place], [place] [address] Phone [phone]. Fax [phone]  Date: 08/14/2020                  Patient Name:  Nathaniel Thomas  MRN: [id number]  DOB: 06-16-1951  Age / Sex: 70 y.o., male         PCP: Dion Body, MD                 Service Requesting Consult: IM/ Flora Lipps, MD                 Reason for Consult: ARF            History of Present Illness: Patient is a 69 y.o. male  admitted to [facility] on 08/19/2020 for evaluation of Metabolic acidosis 99991111 Encounter for central line placement [Z45.2] Acute renal failure (ARF) ([place]) [N17.9] Acute respiratory failure with hypoxia ([place]) [J96.01] Sepsis, due to unspecified organism, unspecified whether acute organ dysfunction present ([place]) [A41.9] COVID-19 [U07.1]   Patient presented to the ER via EMS from home for altered mental status.  Recently treated for COVID.  Per triage notes, patient was prescribed antibiotics for UTI but is unable to take due to lethargy.  Upon arrival patient was responsive to painful stimuli. Also reported to have nausea vomiting and diarrhea for several days prior to admission. Initial evaluation showed severe acidosis with elevated blood sugars.  Also noted to have hypothermia.  He was intubated for severe acute respiratory failure.  Initially on presentation, pH of 6.99  Patient's daughter is in the room with him.  Reports that patient was treated with ivermectin as outpatient for COVID.  He is unvaccinated.  Medications: Outpatient medications: (Not in a hospital admission)   Current medications: Current Facility-Administered Medications  Medication Dose Route Frequency Provider Last Rate Last Admin  . 0.9 %  sodium chloride infusion  250 mL Intravenous PRN Flora Lipps, MD      . 0.9 %  sodium chloride infusion  250 mL Intravenous Continuous Darel Hong D, NP 20 mL/hr at 08/23/2020 2222 250 mL at 08/25/2020 2222  . acetaminophen (TYLENOL) tablet 650 mg  650 mg  Oral Q4H PRN Flora Lipps, MD      . ceFEPIme (MAXIPIME) 2 g in sodium chloride 0.9 % 100 mL IVPB  2 g Intravenous Q12H Flora Lipps, MD   Stopped at 08/14/20 0732  . dextrose 50 % solution 0-50 mL  0-50 mL Intravenous PRN Flora Lipps, MD      . docusate (COLACE) 50 MG/5ML liquid 100 mg  100 mg Per Tube BID Flora Lipps, MD      . docusate sodium (COLACE) capsule 100 mg  100 mg Oral BID PRN Flora Lipps, MD      . famotidine (PEPCID) IVPB 20 mg premix  20 mg Intravenous Q12H Flora Lipps, MD   Stopped at 08/23/2020 1747  . fentaNYL (SUBLIMAZE) bolus via infusion 25 mcg  25 mcg Intravenous Q15 min PRN Flora Lipps, MD      . fentaNYL (SUBLIMAZE) injection 25 mcg  25 mcg Intravenous Once Flora Lipps, MD      . fentaNYL 256mcg in NS 245mL (44mcg/ml) infusion-PREMIX  25-200 mcg/hr Intravenous Continuous Flora Lipps, MD 2.5 mL/hr at 08/24/2020 1436 25 mcg/hr at 08/11/2020 1436  . heparin injection 5,000 Units  5,000 Units Subcutaneous Q8H Flora Lipps, MD   5,000 Units at 08/14/20 0606  . insulin regular, human (MYXREDLIN) 100 units/ 100 mL infusion   Intravenous  Continuous Flora Lipps, MD 14 mL/hr at 08/14/20 0736 14 Units/hr at 08/14/20 0736  . methylPREDNISolone sodium succinate (SOLU-MEDROL) 125 mg/2 mL injection 80 mg  80 mg Intravenous Q12H Darel Hong D, NP   80 mg at 08/14/20 0813  . midazolam (VERSED) injection 1 mg  1 mg Intravenous Q15 min PRN Flora Lipps, MD      . midazolam (VERSED) injection 1 mg  1 mg Intravenous Q2H PRN Flora Lipps, MD      . norepinephrine (LEVOPHED) 16 mg in 285mL premix infusion  0-40 mcg/min Intravenous Titrated Darel Hong D, NP 12.19 mL/hr at 08/14/20 0347 13 mcg/min at 08/14/20 0347  . norepinephrine (LEVOPHED) 4mg  in 267mL premix infusion  2-10 mcg/min Intravenous Titrated Darel Hong D, NP 37.5 mL/hr at 08/14/20 0102 10 mcg/min at 08/14/20 0102  . ondansetron (ZOFRAN) injection 4 mg  4 mg Intravenous Q6H PRN Flora Lipps, MD      . polyethylene  glycol (MIRALAX / GLYCOLAX) packet 17 g  17 g Oral Daily PRN Flora Lipps, MD      . polyethylene glycol (MIRALAX / GLYCOLAX) packet 17 g  17 g Per Tube Daily Kasa, Kurian, MD      . potassium chloride 10 mEq in 50 mL *CENTRAL LINE* IVPB  10 mEq Intravenous Q1 Hr x 6 Darel Hong D, NP 50 mL/hr at 08/14/20 0812 10 mEq at 08/14/20 0812  . propofol (DIPRIVAN) 1000 MG/100ML infusion  0-50 mcg/kg/min Intravenous Continuous Flora Lipps, MD 21.8 mL/hr at 08/14/20 0319 40 mcg/kg/min at 08/14/20 0319  . sodium bicarbonate 150 mEq in dextrose 5 % 1,000 mL infusion   Intravenous Continuous Bradly Bienenstock, NP 125 mL/hr at 08/14/20 0628 New Bag at 08/14/20 [id number]  . sodium bicarbonate 150 mEq in sterile water 1,000 mL infusion   Intravenous Continuous Darel Hong D, NP 100 mL/hr at 08/31/2020 2240 New Bag at 09/01/2020 2240  . sodium chloride flush (NS) 0.9 % injection 3 mL  3 mL Intravenous Q12H Flora Lipps, MD   3 mL at 08/14/20 0325  . sodium chloride flush (NS) 0.9 % injection 3 mL  3 mL Intravenous PRN Flora Lipps, MD       Current Outpatient Medications  Medication Sig Dispense Refill  . aspirin 81 MG chewable tablet Chew 81 mg by mouth daily.    . Cholecalciferol (VITAMIN D3) 250 MCG (10000 UT) capsule Take 10,000 Units by mouth daily.    [name] doxycycline (VIBRAMYCIN) 100 MG capsule Take 100 mg by mouth 2 (two) times daily.    . empagliflozin (JARDIANCE) 25 MG TABS tablet Take 25 mg by mouth daily.    . finasteride (PROSCAR) 5 MG tablet Take 5 mg by mouth daily.    . fluticasone (FLONASE) 50 MCG/ACT nasal spray Place 1 spray into both nostrils 2 (two) times daily.    [name] glimepiride (AMARYL) 4 MG tablet Take 4 mg by mouth daily.    . insulin detemir (LEVEMIR) 100 UNIT/ML injection Inject 25 Units into the skin 2 (two) times daily.    [name] lisinopril (ZESTRIL) 5 MG tablet Take 5 mg by mouth daily.    [name] LUMIGAN 0.01 % SOLN Place 1 drop into both eyes at bedtime.    . metFORMIN (GLUCOPHAGE) 500 MG tablet  Take 1,000 mg by mouth 2 (two) times daily.    . Pitavastatin Calcium 4 MG TABS Take 4 mg by mouth daily.    . tamsulosin (FLOMAX) 0.4 MG CAPS capsule Take 0.4 mg by  mouth daily after supper.     . zinc gluconate 50 MG tablet Take 50 mg by mouth daily.        Allergies: Allergies  Allergen Reactions  . Demerol Other (See Comments)    Blood pressure dropped completely out  . Meperidine Other (See Comments)    Drops blood pressure out  Blood pressure dropped completely out  . Meperidine Hcl Other (See Comments)    Drops blood pressure out  . Nitrospan [Nitroglycerin] Other (See Comments)    Blood pressure dropped out  . Sulfa Antibiotics Other (See Comments)    Urinary burning; blisters   . Sulfasalazine Other (See Comments)    Urinary burning; blisters       Past Medical History: Past Medical History:  Diagnosis Date  . CAD (coronary artery disease)   . Cataract   . CHICKENPOX, HX OF 08/30/2008   Qualifier: Diagnosis of  By: [name], [name]    . Chronic kidney disease   . Diabetes mellitus   . Glaucoma   . History of urinary calculi 08/30/2008   Qualifier: Diagnosis of  By: [name], Jacelyn Pi   . Hyperlipidemia   . Hypertension   . Renal calculus or stone   . Skin cancer of nose    ears and hands     Past Surgical History: Past Surgical History:  Procedure Laterality Date  . ANKLE SURGERY     2013/ wears boot cast  . BASAL CELL CARCINOMA EXCISION     hands  . LAMINECTOMY  1983/1988  . Left knee replacement  2002/ 2004  . LITHOTRIPSY     was cancelled/pt passed stone  . MOHS SURGERY     nose  . TONSILLECTOMY       Family History: Family History  Problem Relation Age of Onset  . Heart disease Brother        Atrial fibrillation  . Heart disease Father        CHF  . Heart disease Mother   . Heart disease Brother        atrial fib  . Cancer Sister        bone     Social History: Social History   Socioeconomic History  . Marital status:  Married    Spouse name: Not on file  . Number of children: 2  . Years of education: Not on file  . Highest education level: Not on file  Occupational History    Comment: Retired  Tobacco Use  . Smoking status: Never Smoker  . Smokeless tobacco: Former Network engineer and Sexual Activity  . Alcohol use: No  . Drug use: No  . Sexual activity: Yes    Comment: 38 year of marriage  Other Topics Concern  . Not on file  Social History Narrative  . Not on file   Social Determinants of Health   Financial Resource Strain: Not on file  Food Insecurity: Not on file  Transportation Needs: Not on file  Physical Activity: Not on file  Stress: Not on file  Social Connections: Not on file  Intimate Partner Violence: Not on file     Review of Systems: Unavailable Gen:  HEENT:  CV:  Resp:  GI: GU :  MS:  Derm:    Psych: Heme:  Neuro:  Endocrine  Vital Signs: Blood pressure (!) 96/41, pulse (!) 117, temperature 99.2 F (37.3 C), temperature source Temporal, resp. rate (!) 27, height 6\' 4"  (1.93 m), weight 90.7  kg, SpO2 100 %.   Intake/Output Summary (Last 24 hours) at 08/14/2020 0820 Last data filed at 08/14/2020 0732 Gross per 24 hour  Intake 5150 ml  Output 30 ml  Net 5120 ml    Weight trends: [employer]   09/03/2020 0947  Weight: 90.7 kg    Physical Exam: General:  Critically ill-appearing,  HEENT  ET tube in place  Lungs:  Ventilator assisted, FiO2 100%  Heart::  Tachycardic, irregular  Abdomen:  Soft, nontender  Extremities:  Trace edema  Neurologic:  Sedated  Skin:  Warm, dry  Access:   Foley:  In place       Lab results: Basic Metabolic Panel: Recent Labs  Lab 08/15/2020 0950 08/14/2020 1615 08/14/20 0300  NA 142 140 143  K 4.5 5.7* 3.1*  CL 109 107 113*  CO2 <7* 7* 17*  GLUCOSE 487* 596* 316*  BUN 51* 55* 62*  CREATININE 2.01* 2.15* 2.76*  CALCIUM 9.0 8.1* 7.8*    Liver Function Tests: Recent Labs  Lab 08/25/2020 0950  AST 32  ALT 22   ALKPHOS 72  BILITOT 1.9*  PROT 8.1  ALBUMIN 3.6   No results for input(s): LIPASE, AMYLASE in the last 168 hours. No results for input(s): AMMONIA in the last 168 hours.  CBC: Recent Labs  Lab 08/08/2020 0950 08/17/2020 1615  WBC 23.2* 28.2*  NEUTROABS 16.1*  --   HGB 16.4 14.3  HCT 53.9* 45.6  MCV 97.3 96.6  PLT 514* 440*    Cardiac Enzymes: No results for input(s): CKTOTAL, TROPONINI in the last 168 hours.  BNP: Invalid input(s): POCBNP  CBG: Recent Labs  Lab 08/14/20 0058 08/14/20 0153 08/14/20 0353 08/14/20 0538 08/14/20 0733  GLUCAP 322* 320* 251* 220* 191*    Microbiology: Recent Results (from the past 720 hour(s))  Blood Culture (routine x 2)     Status: None (Preliminary result)   Collection Time: 08/04/2020  9:50 AM   Specimen: BLOOD  Result Value Ref Range Status   Specimen Description BLOOD LEFT AC  Final   Special Requests   Final    BOTTLES DRAWN AEROBIC AND ANAEROBIC Blood Culture adequate volume   Culture   Final    NO GROWTH < 24 HOURS Performed at [facility], [address], [place], [place] [address]    Report Status PENDING  Incomplete  Blood Culture (routine x 2)     Status: None (Preliminary result)   Collection Time: 08/17/2020  9:50 AM   Specimen: BLOOD  Result Value Ref Range Status   Specimen Description BLOOD RIGHT [facility]  Final   Special Requests   Final    BOTTLES DRAWN AEROBIC AND ANAEROBIC Blood Culture results may not be optimal due to an excessive volume of blood received in culture bottles   Culture   Final    NO GROWTH < 24 HOURS Performed at [facility], [address], [place], [place] [address]    Report Status PENDING  Incomplete     Coagulation Studies: Recent Labs    08/25/2020 0950  LABPROT 15.4*  INR 1.3*    Urinalysis: Recent Labs    08/07/2020 1136  COLORURINE YELLOW*  LABSPEC 1.018  PHURINE 5.0  GLUCOSEU >=500*  HGBUR LARGE*  BILIRUBINUR NEGATIVE  KETONESUR 80*  PROTEINUR 100*   NITRITE NEGATIVE  LEUKOCYTESUR NEGATIVE        Imaging: DG Abdomen 1 View  Result Date: 08/12/2020 CLINICAL DATA:  Nasogastric tube placement. EXAM: ABDOMEN -  1 VIEW COMPARISON:  Abdominal radiographs 10/09/2016. Abdominal CT 06/15/2018. FINDINGS: 1353 hours. Enteric tube projects below the diaphragm, with tip overlying the gastric fundal region. Side hole is near the gastroesophageal junction. The visualized bowel gas pattern is normal. There are degenerative changes within the thoracolumbar spine. IMPRESSION: Enteric tube projects to the level of the proximal stomach. Electronically Signed   By: Richardean Sale M.D.   On: 08/29/2020 14:07   CT Head Wo Contrast  Result Date: 08/21/2020 CLINICAL DATA:  Change in mental status EXAM: CT HEAD WITHOUT CONTRAST TECHNIQUE: Contiguous axial images were obtained from the base of the skull through the vertex without intravenous contrast. COMPARISON:  None. FINDINGS: Brain: No evidence of acute infarction, hemorrhage, hydrocephalus, extra-axial collection or mass lesion/mass effect. Vascular: No hyperdense vessel or unexpected calcification. Skull: Normal. Negative for fracture or focal lesion. Sinuses/Orbits: No acute finding.  Left cataract resection. Other: Pervasive motion artifact to a moderate degree. IMPRESSION: Negative motion degraded head CT. Electronically Signed   By: Monte Fantasia M.D.   On: 08/21/2020 11:11   US RENAL  Result Date: 08/14/2020 CLINICAL DATA:  Acute renal failure EXAM: RENAL / URINARY TRACT ULTRASOUND COMPLETE COMPARISON:  Ultrasound kidneys 04/13/2019 MRI abdomen 07/06/2020 FINDINGS: Right Kidney: Renal measurements: 11.0 x 6.9 x 6.1 = volume: 243 mL. 2.3 cm hypoechoic structure in the anterior right kidney likely a simple cyst given appearance on recent MRI. Echogenicity within normal limits. No suspicious mass or hydronephrosis visualized. Left Kidney: Renal measurements: 11.5 x 6.7 x 1.5 = volume: 302 mL. Echogenicity  within normal limits. No mass or hydronephrosis visualized. 12 mm linear echogenicity in the lower pole suspicious for nonobstructing calculus. Bladder: Unable to evaluate as it is collapsed around a Foley catheter balloon. Other: None. IMPRESSION: 1. No hydronephrosis. 2. Nonobstructing left renal calculus. Electronically Signed   By: Miachel Roux M.D.   On: 08/14/2020 08:16   DG Chest Port 1 View  Result Date: 08/14/2020 CLINICAL DATA:  Endotracheal tube placement EXAM: PORTABLE CHEST 1 VIEW COMPARISON:  August 13, 2020 FINDINGS: The endotracheal tube terminates approximately 6 cm above the carina. The enteric tube extends below the left hemidiaphragm. There is no definite pneumothorax or large pleural effusion. There are increasing airspace opacities at the left lung base and in the retrocardiac region. The heart size is stable. Aortic calcifications are noted. IMPRESSION: 1. Lines and tubes as above. 2. Increasing airspace opacities at the left lung base and within the retrocardiac region suspicious for atelectasis or developing pneumonia. Electronically Signed   By: Constance Holster M.D.   On: 08/14/2020 00:24   DG Chest Port 1 View  Result Date: 08/26/2020 CLINICAL DATA:  Attempted central line placement. Assess for pneumothorax EXAM: PORTABLE CHEST 1 VIEW COMPARISON:  08/05/2020 FINDINGS: Endotracheal tube is 7.6 cm above the carina. NG tube is in the stomach. No visible pneumothorax. Heart is normal size. Bibasilar airspace opacities. No effusions. IMPRESSION: No visible pneumothorax. Bibasilar atelectasis or infiltrates. Electronically Signed   By: Rolm Baptise M.D.   On: 08/12/2020 21:51   DG Chest Portable 1 View  Result Date: 08/17/2020 CLINICAL DATA:  Hypoxia.  Reported nasogastric tube placement EXAM: PORTABLE CHEST 1 VIEW COMPARISON:  August 13, 2020 study obtained earlier in the day FINDINGS: A nasogastric tube is not appreciable on current examination. Endotracheal tube tip is 6.8 cm  above the carina. No pneumothorax. Lungs are clear. Heart size and pulmonary vascularity are normal. No adenopathy. There is degenerative change in the  thoracic spine. IMPRESSION: Endotracheal tube as described without pneumothorax. No nasogastric tube apparent. Lungs clear. Cardiac silhouette normal. Electronically Signed   By: Lowella Grip III M.D.   On: 08/08/2020 12:59   DG Chest Portable 1 View  Result Date: 08/04/2020 CLINICAL DATA:  Chest pain shortness of breath. Recent COVID-19 diagnosis. EXAM: PORTABLE CHEST 1 VIEW COMPARISON:  03/03/2011 FINDINGS: Midline trachea. Borderline cardiomegaly. No pleural effusion or pneumothorax. Suspect mild left base scarring. Clear right lung. IMPRESSION: Borderline cardiomegaly, without acute disease. Electronically Signed   By: Abigail Miyamoto M.D.   On: 08/10/2020 10:24      Assessment & Plan: Pt is a 70 y.o.   male with coronary artery disease, chronic kidney disease, diabetes, glaucoma, history of kidney stones, hyperlipidemia, hypertension, left knee replacement, lithotripsy, basal cell carcinoma, was admitted on 08/14/2020 with COVID-19 [U07.1]   #Acute kidney injury Baseline creatinine 1.0 from August 02, 2020 Presenting creatinine of 2.15  #Acute respiratory failure Requiring ventilator support.  Intubated 08/14/2020  #COVID-19 pneumonia Treated with ivermectin as outpatient  #Diabetes type 2 Hemoglobin A1c 7.9% from June 26, 2020  #Severe acidosis Agree with IV bicarbonate infusion  #Hypokalemia, previously hyperkalemia  Plan: Aggressive care as per ICU protocols Replace electrolytes per ICU protocol Electrolytes and volume status are acceptable.  No acute indication for dialysis at present. Will follow closely    LOS: [place] 1/11/20228:20 AM    Note: This note was prepared with Dragon dictation. Any transcription errors are unintentional

## 2020-08-14 NOTE — Progress Notes (Addendum)
Per Cleone Slim, unit charge nurse, and ICU Assistant Director [place], patient unable to have visitor's at this time. She states that patient is sedated on vent and does not meet visitation guidelines at this time. Per charge RN when patient undergoes WUA, family will be able to visit with patient. Advised patients family. They state they will contact administration and attorney. Video conference offered and E-link called. Advised ICU director as well. Continue to monitor.

## 2020-08-14 NOTE — Progress Notes (Signed)
Inpatient Diabetes Program Recommendations  AACE/ADA: New Consensus Statement on Inpatient Glycemic Control   Target Ranges:  Prepandial:   less than 140 mg/dL      Peak postprandial:   less than 180 mg/dL (1-2 hours)      Critically ill patients:  140 - 180 mg/dL   Results for Nathaniel Thomas, Nathaniel Thomas (MRN [id number]) as of 08/14/2020 09:14  Ref. Range 08/14/2020 00:58 08/14/2020 01:53 08/14/2020 03:53 08/14/2020 05:38 08/14/2020 07:33  Glucose-Capillary Latest Ref Range: 70 - 99 mg/dL 322 (H) 320 (H) 251 (H) 220 (H) 191 (H)  Results for Nathaniel Thomas, Nathaniel Thomas (MRN [id number]) as of 08/14/2020 09:14  Ref. Range 08/20/2020 16:15  Glucose Latest Ref Range: 70 - 99 mg/dL 596 (HH)   Review of Glycemic Control  Diabetes history: DM2 Outpatient Diabetes medications: Levemir 25 units BID, Amaryl 4 mg daily, Jardiance 25 mg daily, Metformin 1000 mg BID Current orders for Inpatient glycemic control: IV insulin  Inpatient Diabetes Program Recommendations:    Insulin: Once acidosis is completely cleared and provider is ready to transition to SQ insulin, please consider using ICU Glycemic Control Phase 3 as patient is critically ill (on vent, on steroids, high insulin needs).   NOTE: Patient admitted with COVID, DKA, respiratory failure, AKI, sepsis, and metabolic encephalopathy. Per chart, patient is intubated, on steroids, and requiring high insulin needs. Once acidosis is cleared and provider is ready to transition to SQ insulin, recommend using ICU Glycemic Control Phase 3 order set. Per chart, noted patient sees Dr. Honor Junes and was last seen 07/05/20 and noted to be prescribed DM medications as noted above. Will continue to follow.  Thanks, Barnie Alderman, RN, MSN, CDE Diabetes Coordinator Inpatient Diabetes Program [phone] (Team Pager from 8am to 5pm)

## 2020-08-14 NOTE — Progress Notes (Signed)
Name: Nathaniel Thomas MRN: [id number] DOB: 01-23-1951     CONSULTATION DATE: 08/06/2020  REFERRING MD :  Raliegh Ip  CHIEF COMPLAINT: Resp failure/COVID/DKA  HISTORY OF PRESENT ILLNESS:    70 y.o. male with PMH as noted below who presents with altered mental status after he was found unresponsive this morning.    Per EMS he was initially hypoxic although this appears to have resolved.  The patient himself is unable to give any history.  Patient Dx with COVID 1 week ago +NVD for several days ER shows severe acidosis with elevated sugars Patient with severe hypothermia and intubated for severe resp failure  PAST MEDICAL HISTORY :   has a past medical history of CAD (coronary artery disease), Cataract, CH[place], HX OF (08/30/2008), Chronic kidney disease, Diabetes mellitus, Glaucoma, History of urinary calculi (08/30/2008), Hyperlipidemia, Hypertension, Renal calculus or stone, and Skin cancer of nose.  has a past surgical history that includes Left knee replacement (2002/ 2004); Tonsillectomy; Lithotripsy; Laminectomy (1983/1988); Excision basal cell carcinoma; Ankle surgery; and Mohs surgery. Prior to Admission medications   Medication Sig Start Date End Date Taking? Authorizing Provider  aspirin 81 MG chewable tablet Chew 81 mg by mouth daily. 07/14/20  Yes [provider]  Cholecalciferol (VITAMIN D3) 250 MCG (10000 UT) capsule Take 10,000 Units by mouth daily.   Yes [provider]  doxycycline (VIBRAMYCIN) 100 MG capsule Take 100 mg by mouth 2 (two) times daily. 08/07/20 08/14/20 Yes [provider]  empagliflozin (JARDIANCE) 25 MG TABS tablet Take 25 mg by mouth daily. 06/30/20  Yes [provider]  finasteride (PROSCAR) 5 MG tablet Take 5 mg by mouth daily.   Yes [provider]  fluticasone (FLONASE) 50 MCG/ACT nasal spray Place 1 spray into both nostrils 2 (two) times daily. 08/02/20  Yes [provider]  glimepiride (AMARYL) 4  MG tablet Take 4 mg by mouth daily. 05/25/20  Yes [provider]  insulin detemir (LEVEMIR) 100 UNIT/ML injection Inject 25 Units into the skin 2 (two) times daily.   Yes [provider]  lisinopril (ZESTRIL) 5 MG tablet Take 5 mg by mouth daily. 07/13/20  Yes [provider]  LUMIGAN 0.01 % SOLN Place 1 drop into both eyes at bedtime.   Yes [provider]  metFORMIN (GLUCOPHAGE) 500 MG tablet Take 1,000 mg by mouth 2 (two) times daily. 06/25/20  Yes [provider]  Pitavastatin Calcium 4 MG TABS Take 4 mg by mouth daily. 05/22/20  Yes [provider]  tamsulosin (FLOMAX) 0.4 MG CAPS capsule Take 0.4 mg by mouth daily after supper.  04/16/14  Yes [provider]  zinc gluconate 50 MG tablet Take 50 mg by mouth daily.   Yes [provider]   Allergies  Allergen Reactions  . Demerol Other (See Comments)    Blood pressure dropped completely out  . Meperidine Other (See Comments)    Drops blood pressure out  Blood pressure dropped completely out  . Meperidine Hcl Other (See Comments)    Drops blood pressure out  . Nitrospan [Nitroglycerin] Other (See Comments)    Blood pressure dropped out  . Sulfa Antibiotics Other (See Comments)    Urinary burning; blisters   . Sulfasalazine Other (See Comments)    Urinary burning; blisters     FAMILY HISTORY:  family history includes Cancer in his sister; Heart disease in his brother, brother, father, and mother. SOCIAL HISTORY:  reports that he has never smoked. He quit  smokeless tobacco use about 16 years ago. He reports that he does not drink alcohol and does not use drugs.  REVIEW OF SYSTEMS:   Unable to obtain due to critical illness    Estimated body mass index is 24.34 kg/m as calculated from the following:   Height as of this encounter: 6\' 4"  (1.93 m).   Weight as of this encounter: 90.7 kg.    VITAL SIGNS: Temp:  [89.2 F (31.8 C)-99.2 F (37.3 C)] 99.2 F  (37.3 C) (01/11 0155) Pulse Rate:  [102-141] 117 (01/11 0155) Resp:  [19-34] 27 (01/11 0155) BP: (80-173)/(41-96) 96/41 (01/11 0155) SpO2:  [98 %-100 %] 100 % (01/11 0536) FiO2 (%):  [35 %-50 %] 35 % (01/11 0455) Weight:  [90.7 kg] 90.7 kg (01/10 0947)  I/O last 3 completed shifts: In: 5050 [IV Piggyback:5050] Out: 30 [Emesis/NG output:30] Total I/O In: 100 [IV Piggyback:100] Out: -   SpO2: 100 % FiO2 (%): 35 %  Physical Examination:  GENERAL:critically ill appearing, NAD mechanically intubated  HEAD: Normocephalic, atraumatic.  EYES: Pupils 2 mm sluggish.  No scleral icterus.  MOUTH: Moist mucosal membrane. NECK: Supple. No JVD.  PULMONARY: Faint rhonchi throughout, even, non labored CARDIOVASCULAR: S1 and S2. Sinus tachycardia. No rubs, or gallops. 2+ radial and 1+ distal pulses  GASTROINTESTINAL: Soft and non distended.  Positive bowel sounds.  MUSCULOSKELETAL: No swelling, clubbing, or edema.  NEUROLOGIC: Sedated, not following commands SKIN: Intact,warm,dry  I personally reviewed lab work that was obtained in last 24 hrs. CXR Independently reviewed-No acute infiltrates  MEDICATIONS: I have reviewed all medications and confirmed regimen as documented  CULTURE RESULTS   Recent Results (from the past 240 hour(s))  Blood Culture (routine x 2)     Status: None (Preliminary result)   Collection Time: 08/12/2020  9:50 AM   Specimen: BLOOD  Result Value Ref Range Status   Specimen Description BLOOD LEFT AC  Final   Special Requests   Final    BOTTLES DRAWN AEROBIC AND ANAEROBIC Blood Culture adequate volume   Culture   Final    NO GROWTH < 24 HOURS Performed at [facility], [address] [place], [place] [address]    Report Status PENDING  Incomplete  Blood Culture (routine x 2)     Status: None (Preliminary result)   Collection Time: 08/27/2020  9:50 AM   Specimen: BLOOD  Result Value Ref Range Status   Specimen Description BLOOD RIGHT AC  Final    Special Requests   Final    BOTTLES DRAWN AEROBIC AND ANAEROBIC Blood Culture results may not be optimal due to an excessive volume of blood received in culture bottles   Culture   Final    NO GROWTH < 24 HOURS Performed at [facility], [address] [place], [place] [address]    Report Status PENDING  Incomplete    IMAGING   DG Abdomen 1 View  Result Date: 08/21/2020 CLINICAL DATA:  Nasogastric tube placement. EXAM: ABDOMEN - 1 VIEW COMPARISON:  Abdominal radiographs 10/09/2016. Abdominal CT 06/15/2018. FINDINGS: 1353 hours. Enteric tube projects below the diaphragm, with tip overlying the gastric fundal region. Side hole is near the gastroesophageal junction. The visualized bowel gas pattern is normal. There are degenerative changes within the thoracolumbar spine. IMPRESSION: Enteric tube projects to the level of the proximal stomach. Electronically Signed   By: Richardean Sale M.D.   On: 08/09/2020 14:07   CT Head Wo Contrast  Result Date: 08/08/2020 CLINICAL DATA:  Change in mental status EXAM: CT HEAD WITHOUT CONTRAST TECHNIQUE: Contiguous axial images were obtained from the base of the skull through the vertex without intravenous contrast. COMPARISON:  None. FINDINGS: Brain: No evidence of acute infarction, hemorrhage, hydrocephalus, extra-axial collection or mass lesion/mass effect. Vascular: No hyperdense vessel or unexpected calcification. Skull: Normal. Negative for fracture or focal lesion. Sinuses/Orbits: No acute finding.  Left cataract resection. Other: Pervasive motion artifact to a moderate degree. IMPRESSION: Negative motion degraded head CT. Electronically Signed   By: Monte Fantasia M.D.   On: 08/15/2020 11:11   US RENAL  Result Date: 08/14/2020 CLINICAL DATA:  Acute renal failure EXAM: RENAL / URINARY TRACT ULTRASOUND COMPLETE COMPARISON:  Ultrasound kidneys 04/13/2019 MRI abdomen 07/06/2020 FINDINGS: Right Kidney: Renal measurements: 11.0 x 6.9 x 6.1 = volume:  243 mL. 2.3 cm hypoechoic structure in the anterior right kidney likely a simple cyst given appearance on recent MRI. Echogenicity within normal limits. No suspicious mass or hydronephrosis visualized. Left Kidney: Renal measurements: 11.5 x 6.7 x 1.5 = volume: 302 mL. Echogenicity within normal limits. No mass or hydronephrosis visualized. 12 mm linear echogenicity in the lower pole suspicious for nonobstructing calculus. Bladder: Unable to evaluate as it is collapsed around a Foley catheter balloon. Other: None. IMPRESSION: 1. No hydronephrosis. 2. Nonobstructing left renal calculus. Electronically Signed   By: Miachel Roux M.D.   On: 08/14/2020 08:16   DG Chest Port 1 View  Result Date: 08/14/2020 CLINICAL DATA:  Endotracheal tube placement EXAM: PORTABLE CHEST 1 VIEW COMPARISON:  August 13, 2020 FINDINGS: The endotracheal tube terminates approximately 6 cm above the carina. The enteric tube extends below the left hemidiaphragm. There is no definite pneumothorax or large pleural effusion. There are increasing airspace opacities at the left lung base and in the retrocardiac region. The heart size is stable. Aortic calcifications are noted. IMPRESSION: 1. Lines and tubes as above. 2. Increasing airspace opacities at the left lung base and within the retrocardiac region suspicious for atelectasis or developing pneumonia. Electronically Signed   By: Constance Holster M.D.   On: 08/14/2020 00:24   DG Chest Port 1 View  Result Date: 08/26/2020 CLINICAL DATA:  Attempted central line placement. Assess for pneumothorax EXAM: PORTABLE CHEST 1 VIEW COMPARISON:  08/27/2020 FINDINGS: Endotracheal tube is 7.6 cm above the carina. NG tube is in the stomach. No visible pneumothorax. Heart is normal size. Bibasilar airspace opacities. No effusions. IMPRESSION: No visible pneumothorax. Bibasilar atelectasis or infiltrates. Electronically Signed   By: Rolm Baptise M.D.   On: 08/24/2020 21:51   DG Chest Portable 1  View  Result Date: 08/29/2020 CLINICAL DATA:  Hypoxia.  Reported nasogastric tube placement EXAM: PORTABLE CHEST 1 VIEW COMPARISON:  August 13, 2020 study obtained earlier in the day FINDINGS: A nasogastric tube is not appreciable on current examination. Endotracheal tube tip is 6.8 cm above the carina. No pneumothorax. Lungs are clear. Heart size and pulmonary vascularity are normal. No adenopathy. There is degenerative change in the thoracic spine. IMPRESSION: Endotracheal tube as described without pneumothorax. No nasogastric tube apparent. Lungs clear. Cardiac silhouette normal. Electronically Signed   By: Lowella Grip III M.D.   On: 08/25/2020 12:59   DG Chest Portable 1 View  Result Date: 08/25/2020 CLINICAL DATA:  Chest pain shortness of breath. Recent COVID-19 diagnosis. EXAM: PORTABLE CHEST 1 VIEW COMPARISON:  03/03/2011 FINDINGS: Midline trachea. Borderline cardiomegaly. No pleural effusion or pneumothorax. Suspect mild left base scarring. Clear right lung. IMPRESSION: Borderline cardiomegaly, without acute  disease. Electronically Signed   By: Abigail Miyamoto M.D.   On: 08/23/2020 10:24     Nutrition Status:       Indwelling Urinary Catheter continued, requirement due to   Reason to continue Indwelling Urinary Catheter strict Intake/Output monitoring for hemodynamic instability   Central Line/ continued, requirement due to  Reason to continue [place] of central venous pressure or other hemodynamic parameters and poor IV access   Ventilator continued, requirement due to severe respiratory failure   Ventilator Sedation RASS 0 to -2    ASSESSMENT AND PLAN SYNOPSIS  Severe ACUTE Hypoxic and Hypercapnic Respiratory Failure due to severe metabolic acidosis due to severe DKA due to severe viral gastroenteritis due to COVID 19 infection complicated by acute renal failure Dx of DKA with diabetic coma -continue Full MV support -continue Bronchodilator Therapy -wean  Fio2 and PEEP as tolerated -VAP/VENT bundle implementation  Mildly elevated troponin likely secondary to demand ischemia in the setting of DKA and COVID-19 Hx: HTN, CAD, and HLD -continuous telemetry monitoring -hold outpatient po cardiac medications for now   ACUTE on chronic renal failure secondary to hypovolemia in the setting of DKA and gastroenteritis  Hypokalemia  -Continue Foley Catheter-assess need -Avoid nephrotoxic agents -Follow urine output, BMP -Ensure adequate renal perfusion, optimize oxygenation -Renal dose medications -Replace electrolytes as indicated  -Nephrology consulted appreciate input   Leukocytosis secondary to viral gastroenteritis secondary to COVID-19 pneumonia  -Trend WBC and monitor fever curve  -Trend PCT  -Continue cefepime for now  -Follow cultures   ENDO-SEVERE DKA with COMA -Continue insulin gtt until anion gap closed and serum CO2 >20 -BMP q4hrs and beta-hydroxy q8hrs while on insulin gtt  -Diabetes coordinator consulted appreciate input   NEUROLOGY Acute toxic metabolic encephalopathy, need for sedation -Goal RASS -1 to -2 for now  -Propofol and fentanyl gtts to maintain RASS goal  -WUA daily    GI GI PROPHYLAXIS as indicated Consider CT abd pelvis  NUTRITIONAL STATUS DIET-->NPO  Constipation protocol as indicated    DVT PX: subq heparin  TRANSFUSIONS AS NEEDED MONITOR FSBS I Assessed the need for Labs I Assessed the need for Foley I Assessed the need for Central Venous Line Family Discussion when available I Assessed the need for Mobilization I made an Assessment of medications to be adjusted accordingly Safety Risk assessment Completed  CASE DISCUSSED IN MULTIDISCIPLINARY ROUNDS WITH ICU TEAM   Critical Care Time devoted to patient care services described in this note is 50 minutes   Updated pts daughter Londell Moh who is currently at bedside regarding pts prognosis and current plan of care.  All questions were  answered.   Marda Stalker, [place] Pager [phone] (please enter 7 digits) PCCM Consult Pager [phone] (please enter 7 digits)

## 2020-08-14 NOTE — Progress Notes (Signed)
Pharmacy Antibiotic Note  Nathaniel Thomas is a 70 y.o. male admitted on 08/21/2020 with sepsis.  Pharmacy has been consulted for Cefepime dosing.  Plan: Cefepime 2 gm IV Q12H ordered to start on 1/11 @ 0130.   Height: 6\' 4"  (193 cm) Weight: 90.7 kg (200 lb) IBW/kg (Calculated) : 86.8  Temp (24hrs), Avg:95.6 F (35.3 C), Min:89.2 F (31.8 C), Max:98 F (36.7 C)  Recent Labs  Lab 08/06/2020 0950 08/21/2020 1136 08/08/2020 1615  WBC 23.2*  --  28.2*  CREATININE 2.01*  --  2.15*  LATICACIDVEN 4.4* 3.8*  --     Estimated Creatinine Clearance: 39.3 mL/min (A) (by C-G formula based on SCr of 2.15 mg/dL (H)).    Allergies  Allergen Reactions  . Demerol Other (See Comments)    Blood pressure dropped completely out  . Meperidine Other (See Comments)    Drops blood pressure out  Blood pressure dropped completely out  . Meperidine Hcl Other (See Comments)    Drops blood pressure out  . Nitrospan [Nitroglycerin] Other (See Comments)    Blood pressure dropped out  . Sulfa Antibiotics Other (See Comments)    Urinary burning; blisters   . Sulfasalazine Other (See Comments)    Urinary burning; blisters     Antimicrobials this admission:   >>    >>   Dose adjustments this admission:   Microbiology results:  BCx:   UCx:    Sputum:    MRSA PCR:   Thank you for allowing pharmacy to be a part of this patient's care.  Myisha Pickerel D 08/14/2020 1:26 AM

## 2020-08-14 NOTE — Progress Notes (Signed)
Patient sedated on fentanyl, stopped propofol. Responds to pian. Insulin drip titrated off. Levophed to maintain map of 65. Family updated throughout the day. Continue to assess.

## 2020-08-14 NOTE — Progress Notes (Signed)
BRIEF CRITICAL CARE NOTE  Pt's daughter is at bedside.  Have updated her that DKA and metabolic acidosis is greately improved, almost resolved.    Discussed CXR concerning for possible developing pneumonia (? Superimposed bacterial pneumonia) of which Cefepime has been initiated.  Also discussed worsening AKI of which Nephrology has been consulted.  She endorses he has a history of renal stones.  Renal US is pending.  We discussed his critical status and guarded prognosis.  Will see how pt progresses over the next 24-48 hours.   Of note, previously in the shift daughter had inquired about whether [facility] had any available ICU beds.  Discussed with Dr[facility] Emmit Alexanders of [facility] who reported that no ICU beds were available as there were several pt's in the [facility] ED already waiting on ICU beds.       Darel Hong, AGACNP-BC [facility] Pager: [phone]

## 2020-08-15 ENCOUNTER — Inpatient Hospital Stay: Payer: HMO

## 2020-08-15 DIAGNOSIS — E440 Moderate protein-calorie malnutrition: Secondary | ICD-10-CM | POA: Insufficient documentation

## 2020-08-15 DIAGNOSIS — E0811 Diabetes mellitus due to underlying condition with ketoacidosis with coma: Secondary | ICD-10-CM | POA: Diagnosis not present

## 2020-08-15 DIAGNOSIS — U071 COVID-19: Secondary | ICD-10-CM | POA: Diagnosis not present

## 2020-08-15 DIAGNOSIS — J9601 Acute respiratory failure with hypoxia: Secondary | ICD-10-CM | POA: Diagnosis not present

## 2020-08-15 LAB — CBC
HCT: 36.9 % — ABNORMAL LOW (ref 39.0–52.0)
Hemoglobin: 13 g/dL (ref 13.0–17.0)
MCH: 30 pg (ref 26.0–34.0)
MCHC: 35.2 g/dL (ref 30.0–36.0)
MCV: 85.2 fL (ref 80.0–100.0)
Platelets: 208 10*3/uL (ref 150–400)
RBC: 4.33 MIL/uL (ref 4.22–5.81)
RDW: 14.8 % (ref 11.5–15.5)
WBC: 27.9 10*3/uL — ABNORMAL HIGH (ref 4.0–10.5)
nRBC: 0 % (ref 0.0–0.2)

## 2020-08-15 LAB — BLOOD GAS, ARTERIAL
Acid-base deficit: 6 mmol/L — ABNORMAL HIGH (ref 0.0–2.0)
Bicarbonate: 18.8 mmol/L — ABNORMAL LOW (ref 20.0–28.0)
FIO2: 0.35
MECHVT: 450 mL
O2 Saturation: 97 %
PEEP: 5 cmH2O
Patient temperature: 37
RATE: 22 resp/min
pCO2 arterial: 34 mmHg (ref 32.0–48.0)
pH, Arterial: 7.35 (ref 7.350–7.450)
pO2, Arterial: 95 mmHg (ref 83.0–108.0)

## 2020-08-15 LAB — FERRITIN: Ferritin: 442 ng/mL — ABNORMAL HIGH (ref 24–336)

## 2020-08-15 LAB — GLUCOSE, CAPILLARY
Glucose-Capillary: 296 mg/dL — ABNORMAL HIGH (ref 70–99)
Glucose-Capillary: 281 mg/dL — ABNORMAL HIGH (ref 70–99)
Glucose-Capillary: 270 mg/dL — ABNORMAL HIGH (ref 70–99)
Glucose-Capillary: 272 mg/dL — ABNORMAL HIGH (ref 70–99)
Glucose-Capillary: 285 mg/dL — ABNORMAL HIGH (ref 70–99)
Glucose-Capillary: 219 mg/dL — ABNORMAL HIGH (ref 70–99)
Glucose-Capillary: 158 mg/dL — ABNORMAL HIGH (ref 70–99)
Glucose-Capillary: 169 mg/dL — ABNORMAL HIGH (ref 70–99)
Glucose-Capillary: 138 mg/dL — ABNORMAL HIGH (ref 70–99)
Glucose-Capillary: 142 mg/dL — ABNORMAL HIGH (ref 70–99)
Glucose-Capillary: 171 mg/dL — ABNORMAL HIGH (ref 70–99)

## 2020-08-15 LAB — BASIC METABOLIC PANEL
Anion gap: 16 — ABNORMAL HIGH (ref 5–15)
BUN: 82 mg/dL — ABNORMAL HIGH (ref 8–23)
CO2: 18 mmol/L — ABNORMAL LOW (ref 22–32)
Calcium: 6.3 mg/dL — CL (ref 8.9–10.3)
Chloride: 108 mmol/L (ref 98–111)
Creatinine, Ser: 4.59 mg/dL — ABNORMAL HIGH (ref 0.61–1.24)
GFR, Estimated: 13 mL/min — ABNORMAL LOW (ref >60)
Glucose, Bld: 301 mg/dL — ABNORMAL HIGH (ref 70–99)
Potassium: 4.5 mmol/L (ref 3.5–5.1)
Sodium: 142 mmol/L (ref 135–145)

## 2020-08-15 LAB — TRIGLYCERIDES: Triglycerides: 310 mg/dL — ABNORMAL HIGH (ref <150)

## 2020-08-15 LAB — C-REACTIVE PROTEIN: CRP: 20.5 mg/dL — ABNORMAL HIGH (ref <1.0)

## 2020-08-15 LAB — PROCALCITONIN: Procalcitonin: 5.11 ng/mL

## 2020-08-15 LAB — D-DIMER, QUANTITATIVE: D-Dimer, Quant: >20 ug/mL-FEU — ABNORMAL HIGH (ref 0.00–0.50)

## 2020-08-15 IMAGING — DX DG CHEST 1V PORT
1 series · 1 of 1 positions shown · non-contrast
Comparison: [DATE]

CLINICAL DATA: Acute respiratory failure with hypoxia

EXAM:
PORTABLE CHEST 1 VIEW

[chest ap]
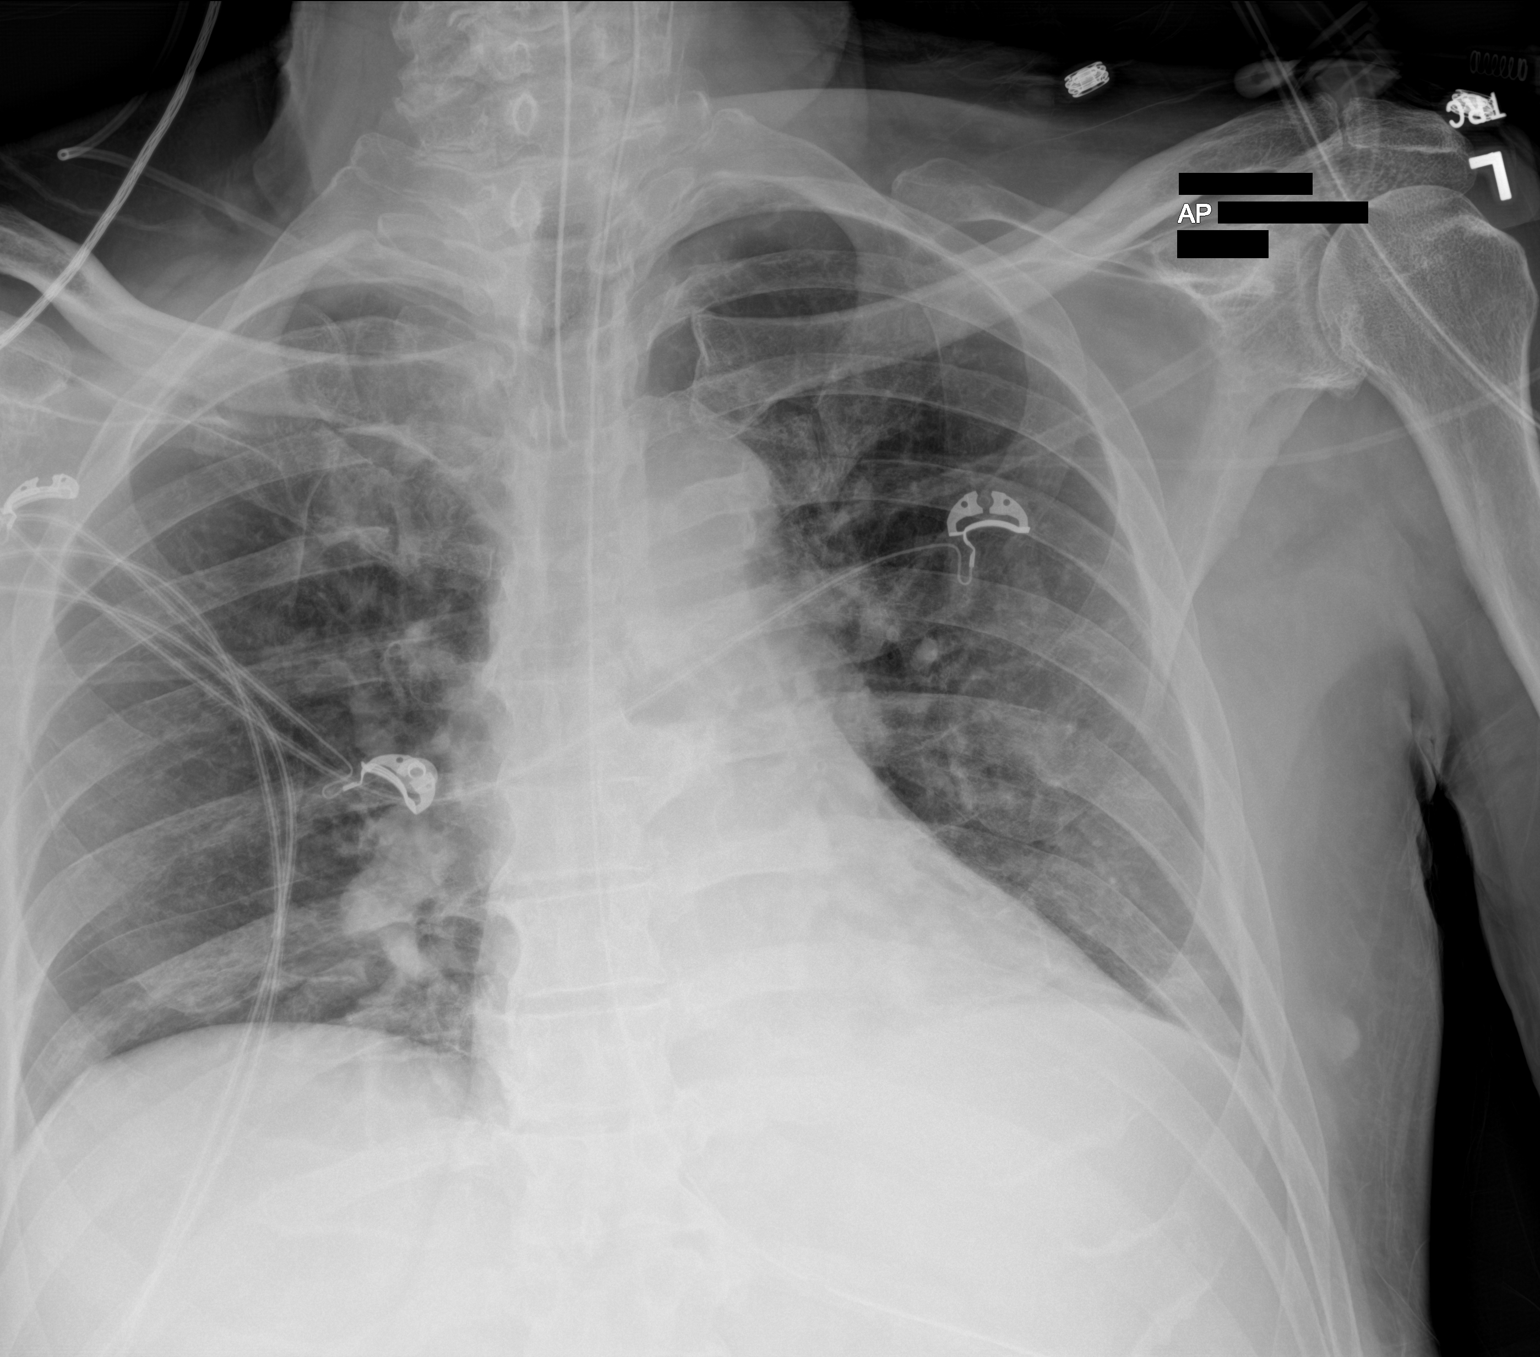

[1 of 1 positions shown; findings below may reference images not displayed]

FINDINGS: Endotracheal tube and NG tube are unchanged. Bibasilar atelectasis
or infiltrates. No effusions. Heart is normal size. No acute bony
abnormality.
IMPRESSION: Bibasilar atelectasis or infiltrates.

## 2020-08-15 MED ORDER — INSULIN ASPART 100 UNIT/ML ~~LOC~~ SOLN
4.0000 [IU] | SUBCUTANEOUS | Status: DC
  Administered 2020-08-16 (×3): 4 [IU] via SUBCUTANEOUS
  Filled 2020-08-15 (×3): qty 1

## 2020-08-15 MED ORDER — DEXTROSE 10 % IV SOLN: INTRAVENOUS | Status: DC | PRN

## 2020-08-15 MED ORDER — VITAL HIGH PROTEIN PO LIQD: 1000.0000 mL | ORAL | Status: DC

## 2020-08-15 MED ORDER — LACTATED RINGERS IV BOLUS
500.0000 mL | Freq: Once | INTRAVENOUS | Status: AC
  Administered 2020-08-15: 500 mL via INTRAVENOUS

## 2020-08-15 MED ORDER — DEXTROSE IN LACTATED RINGERS 5 % IV SOLN: INTRAVENOUS | Status: DC

## 2020-08-15 MED ORDER — INSULIN ASPART 100 UNIT/ML ~~LOC~~ SOLN
3.0000 [IU] | SUBCUTANEOUS | Status: DC
  Administered 2020-08-16 (×2): 6 [IU] via SUBCUTANEOUS
  Administered 2020-08-16: 9 [IU] via SUBCUTANEOUS
  Administered 2020-08-16: 6 [IU] via SUBCUTANEOUS
  Filled 2020-08-15 (×3): qty 1

## 2020-08-15 MED ORDER — INSULIN DETEMIR 100 UNIT/ML ~~LOC~~ SOLN
50.0000 [IU] | Freq: Two times a day (BID) | SUBCUTANEOUS | Status: DC
  Administered 2020-08-15 – 2020-08-16 (×2): 50 [IU] via SUBCUTANEOUS
  Filled 2020-08-15 (×4): qty 0.5

## 2020-08-15 MED ORDER — CALCIUM GLUCONATE-NACL 1-0.675 GM/50ML-% IV SOLN
1.0000 g | Freq: Once | INTRAVENOUS | Status: AC
  Administered 2020-08-15: 1000 mg via INTRAVENOUS
  Filled 2020-08-15: qty 50

## 2020-08-15 MED ORDER — VITAL 1.5 CAL PO LIQD
1000.0000 mL | ORAL | Status: DC
  Administered 2020-08-15 – 2020-08-21 (×5): 1000 mL

## 2020-08-15 MED ORDER — INSULIN DETEMIR 100 UNIT/ML ~~LOC~~ SOLN
45.0000 [IU] | Freq: Two times a day (BID) | SUBCUTANEOUS | Status: DC
  Filled 2020-08-15 (×4): qty 0.45

## 2020-08-15 MED ORDER — PROSOURCE TF PO LIQD
90.0000 mL | Freq: Three times a day (TID) | ORAL | Status: DC
  Administered 2020-08-15 – 2020-08-21 (×18): 90 mL
  Filled 2020-08-15: qty 90

## 2020-08-15 NOTE — Progress Notes (Signed)
Name: Nathaniel Thomas MRN: [id number] DOB: 1951/07/16     CONSULTATION DATE: 08/08/2020  REFERRING MD :  Raliegh Ip  CHIEF COMPLAINT: Resp failure/COVID/DKA  HISTORY OF PRESENT ILLNESS:    70 y.o. male with PMH as noted below who presents with altered mental status after he was found unresponsive this morning.    Per EMS he was initially hypoxic although this appears to have resolved.  The patient himself is unable to give any history.  Patient Dx with COVID 1 week ago +NVD for several days ER shows severe acidosis with elevated sugars Patient with severe hypothermia and intubated for severe resp failure  1/10 admitted to ICU, severe DKA, gastroentertitis 1/11 severe resp failure 1/12 severe DKA    Prior to Admission medications   Medication Sig Start Date End Date Taking? Authorizing Provider  aspirin 81 MG chewable tablet Chew 81 mg by mouth daily. 07/14/20  Yes [provider]  Cholecalciferol (VITAMIN D3) 250 MCG (10000 UT) capsule Take 10,000 Units by mouth daily.   Yes [provider]  doxycycline (VIBRAMYCIN) 100 MG capsule Take 100 mg by mouth 2 (two) times daily. 08/07/20 08/14/20 Yes [provider]  empagliflozin (JARDIANCE) 25 MG TABS tablet Take 25 mg by mouth daily. 06/30/20  Yes [provider]  finasteride (PROSCAR) 5 MG tablet Take 5 mg by mouth daily.   Yes [provider]  fluticasone (FLONASE) 50 MCG/ACT nasal spray Place 1 spray into both nostrils 2 (two) times daily. 08/02/20  Yes [provider]  glimepiride (AMARYL) 4 MG tablet Take 4 mg by mouth daily. 05/25/20  Yes [provider]  insulin detemir (LEVEMIR) 100 UNIT/ML injection Inject 25 Units into the skin 2 (two) times daily.   Yes [provider]  lisinopril (ZESTRIL) 5 MG tablet Take 5 mg by mouth daily. 07/13/20  Yes [provider]  LUMIGAN 0.01 % SOLN Place 1 drop into both eyes at bedtime.   Yes [provider]   metFORMIN (GLUCOPHAGE) 500 MG tablet Take 1,000 mg by mouth 2 (two) times daily. 06/25/20  Yes [provider]  Pitavastatin Calcium 4 MG TABS Take 4 mg by mouth daily. 05/22/20  Yes [provider]  tamsulosin (FLOMAX) 0.4 MG CAPS capsule Take 0.4 mg by mouth daily after supper.  04/16/14  Yes [provider]  zinc gluconate 50 MG tablet Take 50 mg by mouth daily.   Yes [provider]   Allergies  Allergen Reactions  . Demerol Other (See Comments)    Blood pressure dropped completely out  . Meperidine Other (See Comments)    Drops blood pressure out  Blood pressure dropped completely out  . Meperidine Hcl Other (See Comments)    Drops blood pressure out  . Nitrospan [Nitroglycerin] Other (See Comments)    Blood pressure dropped out  . Sulfa Antibiotics Other (See Comments)    Urinary burning; blisters   . Sulfasalazine Other (See Comments)    Urinary burning; blisters     REVIEW OF SYSTEMS  PATIENT IS UNABLE TO PROVIDE COMPLETE REVIEW OF SYSTEM S DUE TO SEVERE CRITICAL ILLNESS AND ENCEPHALOPATHY    Estimated body mass index is 24.39 kg/m as calculated from the following:   Height as of this encounter: 6\' 4"  (1.93 m).   Weight as of this encounter: 90.9 kg.    VITAL SIGNS: Temp:  [98.6 F (37 C)-100.22 F (37.9 C)] 98.6 F (37 C) (01/12 0600) Pulse Rate:  [73-124] 73 (01/12  0600) Resp:  [22-27] 22 (01/12 0600) BP: (70-157)/(45-75) 136/73 (01/12 0600) SpO2:  [100 %] 100 % (01/12 0600) FiO2 (%):  [35 %] 35 % (01/12 0600) Weight:  [90.9 kg] 90.9 kg (01/12 0500)  I/O last 3 completed shifts: In: 3638.5 [I.V.:2023.5; IV Piggyback:1615.1] Out: [id number] [Urine:1215] No intake/output data recorded.  SpO2: 100 % FiO2 (%): 35 %  PHYSICAL EXAMINATION:  GENERAL:critically ill appearing, +resp distress HEAD: Normocephalic, atraumatic.  EYES: Pupils equal, round, reactive to light.  No scleral icterus.  MOUTH: Moist mucosal  membrane. NECK: Supple. No thyromegaly. No nodules. No JVD.  PULMONARY: +rhonchi, +wheezing CARDIOVASCULAR: S1 and S2. Regular rate and rhythm. No murmurs, rubs, or gallops.  GASTROINTESTINAL: Soft, nontender, -distended. Positive bowel sounds.  MUSCULOSKELETAL: No swelling, clubbing, or edema.  NEUROLOGIC: obtunded SKIN:intact,warm,dry    I personally reviewed lab work that was obtained in last 24 hrs. CXR Independently reviewed-No acute infiltrates  MEDICATIONS: I have reviewed all medications and confirmed regimen as documented  CULTURE RESULTS   Recent Results (from the past 240 hour(s))  Blood Culture (routine x 2)     Status: None (Preliminary result)   Collection Time: 08/26/2020  9:50 AM   Specimen: BLOOD  Result Value Ref Range Status   Specimen Description BLOOD LEFT AC  Final   Special Requests   Final    BOTTLES DRAWN AEROBIC AND ANAEROBIC Blood Culture adequate volume   Culture   Final    NO GROWTH 2 DAYS Performed at [facility], [address], [place], [place] [address]    Report Status PENDING  Incomplete  Blood Culture (routine x 2)     Status: None (Preliminary result)   Collection Time: 08/28/2020  9:50 AM   Specimen: BLOOD  Result Value Ref Range Status   Specimen Description BLOOD RIGHT AC  Final   Special Requests   Final    BOTTLES DRAWN AEROBIC AND ANAEROBIC Blood Culture results may not be optimal due to an excessive volume of blood received in culture bottles   Culture   Final    NO GROWTH 2 DAYS Performed at [facility], [address], [place], [place] [address]    Report Status PENDING  Incomplete  Urine culture     Status: None   Collection Time: 08/12/2020 11:36 AM   Specimen: Urine, Random  Result Value Ref Range Status   Specimen Description   Final    URINE, RANDOM Performed at [facility], [address], [place], [place] [address]    Special Requests   Final    NONE Performed at [facility],  [address] [place], [place] [address]    Culture   Final    NO GROWTH Performed at [facility], [place] [address], [place],  [address]    Report Status 08/14/2020 FINAL  Final    IMAGING   US RENAL  Result Date: 08/14/2020 CLINICAL DATA:  Acute renal failure EXAM: RENAL / URINARY TRACT ULTRASOUND COMPLETE COMPARISON:  Ultrasound kidneys 04/13/2019 MRI abdomen 07/06/2020 FINDINGS: Right Kidney: Renal measurements: 11.0 x 6.9 x 6.1 = volume: 243 mL. 2.3 cm hypoechoic structure in the anterior right kidney likely a simple cyst given appearance on recent MRI. Echogenicity within normal limits. No suspicious mass or hydronephrosis visualized. Left Kidney: Renal measurements: 11.5 x 6.7 x 1.5 = volume: 302 mL. Echogenicity within normal limits. No mass or hydronephrosis visualized. 12 mm linear echogenicity in the lower pole suspicious for nonobstructing calculus.  Bladder: Unable to evaluate as it is collapsed around a Foley catheter balloon. Other: None. IMPRESSION: 1. No hydronephrosis. 2. Nonobstructing left renal calculus. Electronically Signed   By: Miachel Roux M.D.   On: 08/14/2020 08:16   DG Chest Port 1 View  Result Date: 08/15/2020 CLINICAL DATA:  Acute respiratory failure with hypoxia EXAM: PORTABLE CHEST 1 VIEW COMPARISON:  08/14/2020 FINDINGS: Endotracheal tube and NG tube are unchanged. Bibasilar atelectasis or infiltrates. No effusions. Heart is normal size. No acute bony abnormality. IMPRESSION: Bibasilar atelectasis or infiltrates. Electronically Signed   By: Rolm Baptise M.D.   On: 08/15/2020 03:01        Indwelling Urinary Catheter continued, requirement due to   Reason to continue Indwelling Urinary Catheter strict Intake/Output monitoring for hemodynamic instability   Central Line/ continued, requirement due to  Reason to continue [place] of central venous pressure or other hemodynamic parameters and poor IV access   Ventilator continued,  requirement due to severe respiratory failure   Ventilator Sedation RASS 0 to -2     ASSESSMENT AND PLAN SYNOPSIS  Severe ACUTE Hypoxic and Hypercapnic Respiratory Failure due to severe metabolic acidosis due to severe DKA due to severe viral gastroenteritis due to COVID 19 infection complicated by acute renal failure Dx of DKA with diabetic coma -continue Full MV support -continue Bronchodilator Therapy -wean Fio2 and PEEP as tolerated -VAP/VENT bundle implementation  Mildly elevated troponin likely secondary to demand ischemia in the setting of DKA and COVID-19 Hx: HTN, CAD, and HLD -continuous telemetry monitoring -hold outpatient po cardiac medications for now    ACUTE KIDNEY INJURY/Renal Failure -continue Foley Catheter-assess need -Avoid nephrotoxic agents -Follow urine output, BMP -Ensure adequate renal perfusion, optimize oxygenation -Renal dose medications Plan for vasc cath and HD  ENDO-SEVERE DKA with COMA -Continue insulin gtt until anion gap closed and serum CO2 >20 -BMP q4hrs and beta-hydroxy q8hrs while on insulin gtt  -Diabetes coordinator consulted appreciate input    NEUROLOGY Acute toxic metabolic encephalopathy, need for sedation Goal RASS -2 to -3 WUA pending   GI GI PROPHYLAXIS as indicated  NUTRITIONAL STATUS DIET-->TF's as tolerated Constipation protocol as indicated CT abd pending    DVT/GI PRX ordered and assessed TRANSFUSIONS AS NEEDED MONITOR FSBS I Assessed the need for Labs I Assessed the need for Foley I Assessed the need for Central Venous Line Family Discussion when available I Assessed the need for Mobilization I made an Assessment of medications to be adjusted accordingly Safety Risk assessment completed  CASE DISCUSSED IN MULTIDISCIPLINARY ROUNDS WITH ICU TEAM     Critical Care Time devoted to patient care services described in this note is 45 minutes.   Overall, patient is critically ill, prognosis is guarded.   Patient with Multiorgan failure and at high risk for cardiac arrest and death.    Corrin Parker, M.D.  [facility]  Medical Director [place] Director [facility] Cardio-Pulmonary Department

## 2020-08-15 NOTE — Progress Notes (Signed)
Referred pt to NP [facility], with total urine output=30 form 2000 H till now, with 0 output since 12 midnight, updated about bld sugar from 2000H to 0000H, latest phosphorus, BUN Crea level  and said will review the chart

## 2020-08-15 NOTE — Progress Notes (Signed)
Reported to start with insulin infusion still awaiting for insulin

## 2020-08-15 NOTE — Progress Notes (Signed)
Blood sugars >200 overnight and urine output minimal. Notified of phos level of 5.3  --Increased Levemir to 45units Q12hr --Suspect if no improvement will need to re-initiate insulin gtt --500cc LR bolus x1 given --Repeat Phos level is pending   Tonye Royalty ACNP-BC

## 2020-08-15 NOTE — Progress Notes (Signed)
Referred back pt to NP Colletta Maryland, discussed about the latest bld sugar = 293 mg/dl and reminded the previous bld sugar, informed that insulin levemir 38 units was given at 2200H, informed about the instruction on the [facility] that above 250 mg/dl bld sugar to start with ICu glycemic insulin  Infusion protocol, NP Stepahanie said will increase the insulin coverage  also informed about the total UO from 2000H to now, UO=120 ml and 175 nl from last 12 hrs shift with total UO=295 ml for the last 24 hours, NP Colletta Maryland said will review the bld work result this AM

## 2020-08-15 NOTE — Progress Notes (Signed)
Inpatient Diabetes Program Recommendations  AACE/ADA: New Consensus Statement on Inpatient Glycemic Control  Target Ranges:  Prepandial:   less than 140 mg/dL      Peak postprandial:   less than 180 mg/dL (1-2 hours)      Critically ill patients:  140 - 180 mg/dL   Results for RISHON, THILGES (MRN [id number]) as of 08/15/2020 07:40  Ref. Range 08/14/2020 07:33 08/14/2020 09:03 08/14/2020 10:56 08/14/2020 11:52 08/14/2020 13:56 08/14/2020 16:21 08/14/2020 19:56 08/14/2020 23:12 08/15/2020 03:11  Glucose-Capillary Latest Ref Range: 70 - 99 mg/dL 191 (H) 161 (H) 151 (H) 151 (H) 145 (H) 145 (H) 240 (H) 243 (H) 296 (H)   Review of Glycemic Control  Diabetes history: DM2 Outpatient Diabetes medications: Levemir 25 units BID, Amaryl 4 mg daily, Jardiance 25 mg daily, Metformin 1000 mg BID Current orders for Inpatient glycemic control: IV insulin, Levemir 45 units Q12H, Novolog 2-6 units Q4H; Solumedrol 80 mg Q12H  Inpatient Diabetes Program Recommendations:    Insulin: If provider prefers to use SQ insulin (versus going back on IV insulin drip) and steroids are continued as ordered, please consider increasing Levemir to 50 units Q12H and Novolog correction to 3-9 units Q4H. If tube feeding is started, please order Novolog tube feeding coverage Q4H.  NOTE: Per chart, patient was transitioned to Phase 3 of ICU Glycemic control order set at 11:02 am. Noted patient received Levemir 38 units BID on 08/14/20 and CBG 296 mg/dl this morning at 3:11 am and no Novolog correction was given (charted as not given; will restart insulin infusion). However, it does not appear that IV insulin was restarted and noted Levemir dose changed to 45 units BID this morning at 5:37 am.   Thanks, Barnie Alderman, RN, MSN, CDE Diabetes Coordinator Inpatient Diabetes Program [phone] (Team Pager from 8am to [place])

## 2020-08-15 NOTE — Progress Notes (Signed)
Pt unable to clearly follow commands during long WUA Sedation resumed for discomfort. Daughter York Cerise at bedside. Unstagable injury found on pts low coccyx with serous blisters. Daughter made aware. Started on insulin gtt in AM and pt ready for transition orders per endo tool. Very little urine output this shift. Possible Dialysis tomorrow.

## 2020-08-15 NOTE — Progress Notes (Signed)
[address] [place], [place] [address] Phone [phone]. Fax [phone]  Date: 08/15/2020                  Patient Name:  Nathaniel Thomas  MRN: [id number]  DOB: 1951-05-21  Age / Sex: 70 y.o., male         PCP: Dion Body, MD                  Presenting Illness: Patient is a 70 y.o. male  admitted to [facility] on 08/12/2020 for evaluation of Metabolic acidosis [N19.1] Encounter for central line placement [Z45.2] Acute renal failure (ARF) ([place]) [N17.9] Acute respiratory failure with hypoxia ([place]) [J96.01] Sepsis, due to unspecified organism, unspecified whether acute organ dysfunction present ([place]) [A41.9] COVID-19 [U07.1]   Patient presented to the ER via EMS from home for altered mental status.  Recently treated for COVID.  Per triage notes, patient was prescribed antibiotics for UTI but is unable to take due to lethargy.  Upon arrival patient was responsive to painful stimuli. Also reported to have nausea vomiting and diarrhea for several days prior to admission.  Initial evaluation showed severe acidosis with elevated blood sugars.  Also noted to have hypothermia.  He was intubated for severe acute respiratory failure.  Initially on presentation, pH of 6.99. Patient was treated with ivermectin as outpatient for COVID.  He is unvaccinated.  Hospital course: 08/15/2020:-Patient being monitored in the ICU.  Ventilator dependent.  FiO2 35%.  Urine output has decreased significantly.  Daughter at bedside.  01/11 0701 - 01/12 0700 In: 2638.5 [I.V.:2023.5; IV Piggyback:615.1] Out: 6606 [Urine:1215]    Vital Signs: Blood pressure 123/69, pulse 74, temperature 98.96 F (37.2 C), resp. rate (!) 22, height 6' 4"  (1.93 m), weight 90.9 kg, SpO2 100 %.   Intake/Output Summary (Last 24 hours) at 08/15/2020 0947 Last data filed at 08/15/2020 0600 Gross per 24 hour  Intake 2538.54 ml  Output 315 ml  Net 2223.54 ml    Weight trends: Filed Weights   08/11/2020 0947  08/15/20 0500  Weight: 90.7 kg 90.9 kg    Physical Exam: General:  Critically ill-appearing,  HEENT  ET tube in place  Lungs:  Ventilator assisted, FiO2 100%  Heart::  Tachycardic, irregular  Abdomen:  Soft, nontender  Extremities:  Trace edema  Neurologic:  Sedated  Skin:  Warm, dry  Access:   Foley:  In place       Lab results: Basic Metabolic Panel: Recent Labs  Lab 08/14/20 0300 08/14/20 0957 08/14/20 1758 08/14/20 1959 08/15/20 0831  NA 143 144 140  --  142  K 3.1* 3.4* 4.4  --  4.5  CL 113* 111 107  --  108  CO2 17* 21* 23  --  18*  GLUCOSE 316* 180* 204*  --  301*  BUN 62* 63* 67*  --  82*  CREATININE 2.76* 3.14* 3.63*  --  4.59*  CALCIUM 7.8* 7.7* 6.7*  --  6.3*  MG 2.5*  --   --   --   --   PHOS 1.6*  --   --  5.3*  --     Liver Function Tests: Recent Labs  Lab 08/09/2020 0950  AST 32  ALT 22  ALKPHOS 72  BILITOT 1.9*  PROT 8.1  ALBUMIN 3.6   No results for input(s): LIPASE, AMYLASE in the last 168 hours. No results for input(s): AMMONIA in the last 168 hours.  CBC: Recent Labs  Lab 08/05/2020  3009 08/11/2020 1615 08/14/20 0957 08/15/20 0458  WBC 23.2*   < > 34.7* 27.9*  NEUTROABS 16.1*  --   --   --   HGB 16.4   < > 14.0 13.0  HCT 53.9*   < > 40.9 36.9*  MCV 97.3   < > 88.1 85.2  PLT 514*   < > 257 208   < > = values in this interval not displayed.    Cardiac Enzymes: No results for input(s): CKTOTAL, TROPONINI in the last 168 hours.  BNP: Invalid input(s): POCBNP  CBG: Recent Labs  Lab 08/14/20 1621 08/14/20 1956 08/14/20 2312 08/15/20 0311 08/15/20 0817  GLUCAP 145* 240* 243* 296* 281*    Microbiology: Recent Results (from the past 720 hour(s))  Blood Culture (routine x 2)     Status: None (Preliminary result)   Collection Time: 08/05/2020  9:50 AM   Specimen: BLOOD  Result Value Ref Range Status   Specimen Description BLOOD LEFT [facility]  Final   Special Requests   Final    BOTTLES DRAWN AEROBIC AND ANAEROBIC Blood Culture  adequate volume   Culture   Final    NO GROWTH 2 DAYS Performed at [facility], [address] [place], [place] [address]    Report Status PENDING  Incomplete  Blood Culture (routine x 2)     Status: None (Preliminary result)   Collection Time: 08/07/2020  9:50 AM   Specimen: BLOOD  Result Value Ref Range Status   Specimen Description BLOOD RIGHT AC  Final   Special Requests   Final    BOTTLES DRAWN AEROBIC AND ANAEROBIC Blood Culture results may not be optimal due to an excessive volume of blood received in culture bottles   Culture   Final    NO GROWTH 2 DAYS Performed at [facility], [address], [place], [place] [address]    Report Status PENDING  Incomplete  Urine culture     Status: None   Collection Time: 08/05/2020 11:36 AM   Specimen: Urine, Random  Result Value Ref Range Status   Specimen Description   Final    URINE, RANDOM Performed at [facility], [address] [place], [place] [address]    Special Requests   Final    NONE Performed at [facility], [address] [place], [place] [address]    Culture   Final    NO GROWTH Performed at [facility], [place] [address] [place], [place] [address]    Report Status 08/14/2020 FINAL  Final     Coagulation Studies: Recent Labs    08/26/2020 0950  LABPROT 15.4*  INR 1.3*    Urinalysis: Recent Labs    09/01/2020 1136  COLORURINE YELLOW*  LABSPEC 1.018  PHURINE 5.0  GLUCOSEU >=500*  HGBUR LARGE*  BILIRUBINUR NEGATIVE  KETONESUR 80*  PROTEINUR 100*  NITRITE NEGATIVE  LEUKOCYTESUR NEGATIVE        Imaging: DG Abdomen 1 View  Result Date: 08/08/2020 CLINICAL DATA:  Nasogastric tube placement. EXAM: ABDOMEN - 1 VIEW COMPARISON:  Abdominal radiographs 10/09/2016. Abdominal CT 06/15/2018. FINDINGS: 1353 hours. Enteric tube projects below the diaphragm, with tip overlying the gastric fundal region. Side hole is near the gastroesophageal junction. The visualized  bowel gas pattern is normal. There are degenerative changes within the thoracolumbar spine. IMPRESSION: Enteric tube projects to the level of the proximal stomach. Electronically Signed   By: Richardean Sale M.D.   On: 08/21/2020 14:07  CT Head Wo Contrast  Result Date: 08/12/2020 CLINICAL DATA:  Change in mental status EXAM: CT HEAD WITHOUT CONTRAST TECHNIQUE: Contiguous axial images were obtained from the base of the skull through the vertex without intravenous contrast. COMPARISON:  None. FINDINGS: Brain: No evidence of acute infarction, hemorrhage, hydrocephalus, extra-axial collection or mass lesion/mass effect. Vascular: No hyperdense vessel or unexpected calcification. Skull: Normal. Negative for fracture or focal lesion. Sinuses/Orbits: No acute finding.  Left cataract resection. Other: Pervasive motion artifact to a moderate degree. IMPRESSION: Negative motion degraded head CT. Electronically Signed   By: Monte Fantasia M.D.   On: 09/01/2020 11:11   US RENAL  Result Date: 08/14/2020 CLINICAL DATA:  Acute renal failure EXAM: RENAL / URINARY TRACT ULTRASOUND COMPLETE COMPARISON:  Ultrasound kidneys 04/13/2019 MRI abdomen 07/06/2020 FINDINGS: Right Kidney: Renal measurements: 11.0 x 6.9 x 6.1 = volume: 243 mL. 2.3 cm hypoechoic structure in the anterior right kidney likely a simple cyst given appearance on recent MRI. Echogenicity within normal limits. No suspicious mass or hydronephrosis visualized. Left Kidney: Renal measurements: 11.5 x 6.7 x 1.5 = volume: 302 mL. Echogenicity within normal limits. No mass or hydronephrosis visualized. 12 mm linear echogenicity in the lower pole suspicious for nonobstructing calculus. Bladder: Unable to evaluate as it is collapsed around a Foley catheter balloon. Other: None. IMPRESSION: 1. No hydronephrosis. 2. Nonobstructing left renal calculus. Electronically Signed   By: Miachel Roux M.D.   On: 08/14/2020 08:16   DG Chest Port 1 View  Result Date:  08/15/2020 CLINICAL DATA:  Acute respiratory failure with hypoxia EXAM: PORTABLE CHEST 1 VIEW COMPARISON:  08/14/2020 FINDINGS: Endotracheal tube and NG tube are unchanged. Bibasilar atelectasis or infiltrates. No effusions. Heart is normal size. No acute bony abnormality. IMPRESSION: Bibasilar atelectasis or infiltrates. Electronically Signed   By: Rolm Baptise M.D.   On: 08/15/2020 03:01   DG Chest Port 1 View  Result Date: 08/14/2020 CLINICAL DATA:  Endotracheal tube placement EXAM: PORTABLE CHEST 1 VIEW COMPARISON:  August 13, 2020 FINDINGS: The endotracheal tube terminates approximately 6 cm above the carina. The enteric tube extends below the left hemidiaphragm. There is no definite pneumothorax or large pleural effusion. There are increasing airspace opacities at the left lung base and in the retrocardiac region. The heart size is stable. Aortic calcifications are noted. IMPRESSION: 1. Lines and tubes as above. 2. Increasing airspace opacities at the left lung base and within the retrocardiac region suspicious for atelectasis or developing pneumonia. Electronically Signed   By: Constance Holster M.D.   On: 08/14/2020 00:24   DG Chest Port 1 View  Result Date: 08/20/2020 CLINICAL DATA:  Attempted central line placement. Assess for pneumothorax EXAM: PORTABLE CHEST 1 VIEW COMPARISON:  08/14/2020 FINDINGS: Endotracheal tube is 7.6 cm above the carina. NG tube is in the stomach. No visible pneumothorax. Heart is normal size. Bibasilar airspace opacities. No effusions. IMPRESSION: No visible pneumothorax. Bibasilar atelectasis or infiltrates. Electronically Signed   By: Rolm Baptise M.D.   On: 08/23/2020 21:51   DG Chest Portable 1 View  Result Date: 08/16/2020 CLINICAL DATA:  Hypoxia.  Reported nasogastric tube placement EXAM: PORTABLE CHEST 1 VIEW COMPARISON:  August 13, 2020 study obtained earlier in the day FINDINGS: A nasogastric tube is not appreciable on current examination. Endotracheal tube  tip is 6.8 cm above the carina. No pneumothorax. Lungs are clear. Heart size and pulmonary vascularity are normal. No adenopathy. There is degenerative change in the thoracic spine. IMPRESSION: Endotracheal tube as described  without pneumothorax. No nasogastric tube apparent. Lungs clear. Cardiac silhouette normal. Electronically Signed   By: Lowella Grip III M.D.   On: 08/23/2020 12:59   DG Chest Portable 1 View  Result Date: 08/16/2020 CLINICAL DATA:  Chest pain shortness of breath. Recent COVID-19 diagnosis. EXAM: PORTABLE CHEST 1 VIEW COMPARISON:  03/03/2011 FINDINGS: Midline trachea. Borderline cardiomegaly. No pleural effusion or pneumothorax. Suspect mild left base scarring. Clear right lung. IMPRESSION: Borderline cardiomegaly, without acute disease. Electronically Signed   By: Abigail Miyamoto M.D.   On: 08/17/2020 10:24   Scheduled Meds: . chlorhexidine gluconate (MEDLINE KIT)  15 mL Mouth Rinse BID  . Chlorhexidine Gluconate Cloth  6 each Topical Daily  . docusate  100 mg Per Tube BID  . fentaNYL (SUBLIMAZE) injection  25 mcg Intravenous Once  . heparin  5,000 Units Subcutaneous Q8H  . insulin aspart  2-6 Units Subcutaneous Q4H  . insulin detemir  45 Units Subcutaneous Q12H  . mouth rinse  15 mL Mouth Rinse 10 times per day  . methylPREDNISolone (SOLU-MEDROL) injection  80 mg Intravenous Q12H  . polyethylene glycol  17 g Per Tube Daily  . sodium chloride flush  3 mL Intravenous Q12H   Continuous Infusions: . sodium chloride    . sodium chloride 250 mL (08/25/2020 2222)  . ceFEPime (MAXIPIME) IV 2 g (08/15/20 1157)  . famotidine (PEPCID) IV    . fentaNYL infusion INTRAVENOUS 350 mcg/hr (08/15/20 0839)  . insulin Stopped (08/14/20 1930)  . lactated ringers 75 mL/hr at 08/15/20 0829  . midazolam    . norepinephrine (LEVOPHED) Adult infusion 6 mcg/min (08/15/20 0400)  . sodium bicarbonate (isotonic) 150 mEq in D5W 1000 mL infusion Stopped (08/14/20 1930)   PRN Meds:.sodium  chloride, acetaminophen, dextrose, docusate sodium, fentaNYL, midazolam, midazolam, ondansetron (ZOFRAN) IV, polyethylene glycol, sodium chloride flush   Assessment & Plan: Pt is a 70 y.o.   male with coronary artery disease, chronic kidney disease, diabetes, glaucoma, history of kidney stones, hyperlipidemia, hypertension, left knee replacement, lithotripsy, basal cell carcinoma, was admitted on 2/62/0355 with Metabolic acidosis [H74.1] Encounter for central line placement [Z45.2] Acute renal failure (ARF) ([place]) [N17.9] Acute respiratory failure with hypoxia ([place]) [J96.01] Sepsis, due to unspecified organism, unspecified whether acute organ dysfunction present ([place]) [A41.9] COVID-19 [U07.1]   #Acute kidney injury Baseline creatinine 1.0 from August 02, 2020 Presenting creatinine of 2.15 Lab Results  Component Value Date   CREATININE 4.59 (H) 08/15/2020   CREATININE 3.63 (H) 08/14/2020   CREATININE 3.14 (H) 08/14/2020     #Acute respiratory failure Requiring ventilator support.  Intubated 08/14/2020  #COVID-19 pneumonia Treated with ivermectin as outpatient  #Diabetes type 2 Hemoglobin A1c 7.9% from June 26, 2020  #Severe acidosis Agree with IV bicarbonate infusion  #Hypokalemia, previously hyperkalemia  Plan: Aggressive care as per ICU protocols Replace electrolytes per ICU protocol Electrolytes and volume status are acceptable.  No acute indication for dialysis at present, but with developing oliguria and worsening BUN/creatinine, we may have to escalate care to dialysis tomorrow.  Discussed with ICU team as well as patient's daughter.    LOS: 2 Nathaniel Thomas 1/12/20229:47 AM    Note: This note was prepared with Dragon dictation. Any transcription errors are unintentional

## 2020-08-15 NOTE — Progress Notes (Signed)
Followed up with NP Colletta [place] about the insulin coverage of bld sugar 293 mg/dl, and said already reviewed the [facility] and instructed to start insulin fusion based on protocol, insulin infusion requested to pharmacy

## 2020-08-15 NOTE — Progress Notes (Signed)
Assisted tele visit to patient with daughter, Roselyn Reef.  Maryelizabeth Rowan, RN

## 2020-08-15 NOTE — Progress Notes (Signed)
Initial Nutrition Assessment  DOCUMENTATION CODES:   Non-severe (moderate) malnutrition in context of chronic illness  INTERVENTION:  Initiate Vital 1.5 Cal at 15 mL/hr and advance by 15 mL/hr every 12 hours to goal rate of 45 mL/hr (1080 mL goal daily volume). Also provide PROSource TF 90 mL TID per tube. Goal regimen provides 1860 kcal, 139 grams of protein, 821 mL H2O daily.  Monitor magnesium, potassium, and phosphorus daily for at least 3 days, MD to replete as needed, as pt is at risk for refeeding syndrome.  NUTRITION DIAGNOSIS:   Moderate Malnutrition related to chronic illness (CKD) as evidenced by mild fat depletion,mild muscle depletion.  GOAL:   Patient will meet greater than or equal to 90% of their needs  MONITOR:   Vent status,Labs,Weight trends,TF tolerance,I & O's  REASON FOR ASSESSMENT:   Ventilator,Consult Enteral/tube feeding initiation and management  ASSESSMENT:   70 year old male with PMHx of DM, HTN, HLD, glaucoma, CAD, CKD recently diagnosed with COVID-19 admitted with DKA, gastroenteritis.   1/10 intubated 1/11 transferred to ICU from ED  Patient is currently intubated on ventilator support MV: 9.85 L/min Temp (24hrs), Avg:99.3 F (37.4 C), Min:98.6 F (37 C), Max:100.22 F (37.9 C)  Medications reviewed and include: Colace 100 mg BID, Novolog 2-6 units Q4hrs, Levemir 45 units Q12hrs, Solu-medrol 80 mg Q12hrs IV, Miralax, cefepime, D5 in LR at 50 mL/hr, famotidine, fentanyl gtt, regular insulin gtt, norepinephrine gtt now stopped.  Labs reviewed: CBG 158-285, CO2 18, BUN 82, Creatinine 4.59.  I/O: 1215 mL UOP yesterday  Weight trend: per review of chart patient previously weighed around 98-100 kg; he was 100.2 kg on 08/26/2017 and no other recent wt hx to trend; currently 90.9 kg (200.4 lbs)  Enteral Access: 16 Fr. OGT placed 1/10; terminates in proximal stomach per abdominal x-ray 1/10  Discussed with RN and on rounds. Plan is to start  tube feeds today if patient does not extubate. In afternoon patient placed back on PRVC mode.  NUTRITION - FOCUSED PHYSICAL EXAM:  Flowsheet Row Most Recent Value  Orbital Region Mild depletion  Upper Arm Region Moderate depletion  Thoracic and Lumbar Region No depletion  Buccal Region Unable to assess  Temple Region Mild depletion  Clavicle Bone Region Mild depletion  Clavicle and Acromion Bone Region Mild depletion  Scapular Bone Region Unable to assess  Dorsal Hand No depletion  Patellar Region Mild depletion  Anterior Thigh Region Mild depletion  Posterior Calf Region Moderate depletion  Edema (RD Assessment) None  Hair Reviewed  Eyes Unable to assess  Mouth Unable to assess  Skin Reviewed  Nails Reviewed     Diet Order:   Diet Order            Diet NPO time specified  Diet effective now                EDUCATION NEEDS:   No education needs have been identified at this time  Skin:  Skin Assessment: Skin Integrity Issues: Skin Integrity Issues:: DTI DTI: sacrum  Last BM:  Unknown/PTA  Height:   Ht Readings from Last 1 Encounters:  08/10/2020 6\' 4"  (1.93 m)   Weight:   Wt Readings from Last 1 Encounters:  08/15/20 90.9 kg   Ideal Body Weight:  91.8 kg  BMI:  Body mass index is 24.39 kg/m.  Estimated Nutritional Needs:   Kcal:  1750-1950  Protein:  135-145 grams  Fluid:  2.2 L/day  Jacklynn Barnacle, MS, RD, LDN  Pager number available on Amion

## 2020-08-15 NOTE — Progress Notes (Signed)
With order to give IV bolus LR 500 ml and to increase insulin levemir to 45 units

## 2020-08-16 ENCOUNTER — Inpatient Hospital Stay: Payer: HMO

## 2020-08-16 DIAGNOSIS — U071 COVID-19: Secondary | ICD-10-CM | POA: Diagnosis not present

## 2020-08-16 DIAGNOSIS — E440 Moderate protein-calorie malnutrition: Secondary | ICD-10-CM

## 2020-08-16 DIAGNOSIS — N170 Acute kidney failure with tubular necrosis: Secondary | ICD-10-CM | POA: Diagnosis not present

## 2020-08-16 DIAGNOSIS — E0811 Diabetes mellitus due to underlying condition with ketoacidosis with coma: Secondary | ICD-10-CM | POA: Diagnosis not present

## 2020-08-16 LAB — HEPARIN LEVEL (UNFRACTIONATED): Heparin Unfractionated: 0.73 IU/mL — ABNORMAL HIGH (ref 0.30–0.70)

## 2020-08-16 LAB — CBC
HCT: 36.5 % — ABNORMAL LOW (ref 39.0–52.0)
Hemoglobin: 12.6 g/dL — ABNORMAL LOW (ref 13.0–17.0)
MCH: 30.1 pg (ref 26.0–34.0)
MCHC: 34.5 g/dL (ref 30.0–36.0)
MCV: 87.1 fL (ref 80.0–100.0)
Platelets: 158 10*3/uL (ref 150–400)
RBC: 4.19 MIL/uL — ABNORMAL LOW (ref 4.22–5.81)
RDW: 15.2 % (ref 11.5–15.5)
WBC: 24.9 10*3/uL — ABNORMAL HIGH (ref 4.0–10.5)
nRBC: 0 % (ref 0.0–0.2)

## 2020-08-16 LAB — GLUCOSE, CAPILLARY
Glucose-Capillary: 129 mg/dL — ABNORMAL HIGH (ref 70–99)
Glucose-Capillary: 141 mg/dL — ABNORMAL HIGH (ref 70–99)
Glucose-Capillary: 152 mg/dL — ABNORMAL HIGH (ref 70–99)
Glucose-Capillary: 154 mg/dL — ABNORMAL HIGH (ref 70–99)
Glucose-Capillary: 182 mg/dL — ABNORMAL HIGH (ref 70–99)
Glucose-Capillary: 197 mg/dL — ABNORMAL HIGH (ref 70–99)
Glucose-Capillary: 198 mg/dL — ABNORMAL HIGH (ref 70–99)
Glucose-Capillary: 258 mg/dL — ABNORMAL HIGH (ref 70–99)
Glucose-Capillary: 294 mg/dL — ABNORMAL HIGH (ref 70–99)
Glucose-Capillary: 335 mg/dL — ABNORMAL HIGH (ref 70–99)

## 2020-08-16 LAB — RENAL FUNCTION PANEL
Albumin: 2.1 g/dL — ABNORMAL LOW (ref 3.5–5.0)
Albumin: 2.1 g/dL — ABNORMAL LOW (ref 3.5–5.0)
Anion gap: 11 (ref 5–15)
Anion gap: 16 — ABNORMAL HIGH (ref 5–15)
BUN: 99 mg/dL — ABNORMAL HIGH (ref 8–23)
BUN: 124 mg/dL — ABNORMAL HIGH (ref 8–23)
CO2: 18 mmol/L — ABNORMAL LOW (ref 22–32)
CO2: 17 mmol/L — ABNORMAL LOW (ref 22–32)
Calcium: 5.8 mg/dL — CL (ref 8.9–10.3)
Calcium: 5.6 mg/dL — CL (ref 8.9–10.3)
Chloride: 110 mmol/L (ref 98–111)
Chloride: 109 mmol/L (ref 98–111)
Creatinine, Ser: 5.74 mg/dL — ABNORMAL HIGH (ref 0.61–1.24)
Creatinine, Ser: 6.12 mg/dL — ABNORMAL HIGH (ref 0.61–1.24)
GFR, Estimated: 10 mL/min — ABNORMAL LOW (ref >60)
GFR, Estimated: 9 mL/min — ABNORMAL LOW (ref >60)
Glucose, Bld: 243 mg/dL — ABNORMAL HIGH (ref 70–99)
Glucose, Bld: 329 mg/dL — ABNORMAL HIGH (ref 70–99)
Phosphorus: 6.5 mg/dL — ABNORMAL HIGH (ref 2.5–4.6)
Phosphorus: 7.6 mg/dL — ABNORMAL HIGH (ref 2.5–4.6)
Potassium: 4.8 mmol/L (ref 3.5–5.1)
Potassium: 4.9 mmol/L (ref 3.5–5.1)
Sodium: 139 mmol/L (ref 135–145)
Sodium: 142 mmol/L (ref 135–145)

## 2020-08-16 LAB — MAGNESIUM
Magnesium: 2 mg/dL (ref 1.7–2.4)
Magnesium: 2.2 mg/dL (ref 1.7–2.4)

## 2020-08-16 LAB — CKMB (ARMC ONLY): CK, MB: 28.3 ng/mL — ABNORMAL HIGH (ref 0.5–5.0)

## 2020-08-16 LAB — TROPONIN I (HIGH SENSITIVITY)
Troponin I (High Sensitivity): 1874 ng/L (ref <18)
Troponin I (High Sensitivity): 1755 ng/L (ref <18)

## 2020-08-16 LAB — C-REACTIVE PROTEIN: CRP: 13.7 mg/dL — ABNORMAL HIGH (ref <1.0)

## 2020-08-16 LAB — FIBRIN DERIVATIVES D-DIMER (ARMC ONLY): Fibrin derivatives D-dimer (ARMC): >7500 ng/mL (FEU) — ABNORMAL HIGH (ref 0.00–499.00)

## 2020-08-16 LAB — FERRITIN: Ferritin: 494 ng/mL — ABNORMAL HIGH (ref 24–336)

## 2020-08-16 IMAGING — DX DG CHEST 1V PORT
1 series · 1 of 1 positions shown · non-contrast
Comparison: [DATE]

CLINICAL DATA: Central line placement

EXAM:
PORTABLE CHEST 1 VIEW

[chest ap]
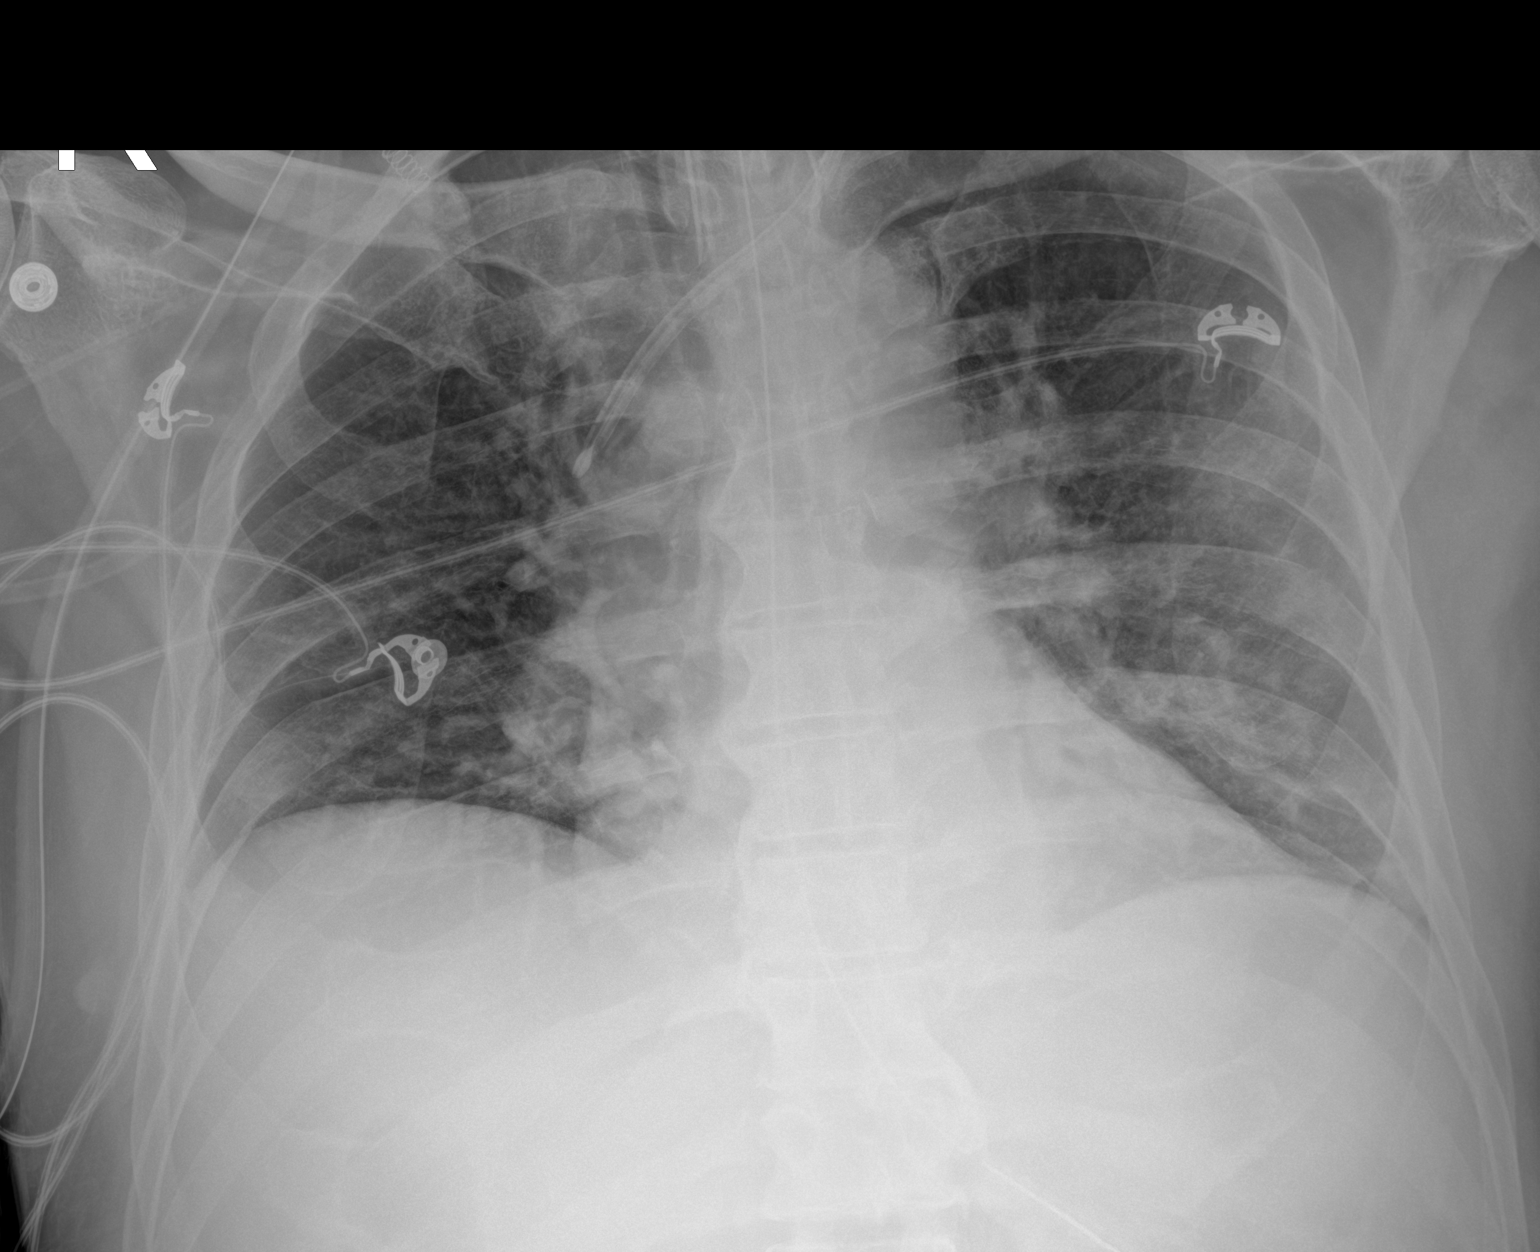

[1 of 1 positions shown; findings below may reference images not displayed]

FINDINGS: Interval placement of left neck vascular catheter, tip positioned
over the superior SVC. Otherwise unchanged AP portable chest
radiograph, support apparatus including endotracheal tube and
esophagogastric tube, with patchy bilateral heterogeneous airspace
opacity. The heart and mediastinum are unremarkable.
IMPRESSION: 1. Interval placement of left neck vascular catheter, tip positioned
over the superior SVC.
2. Otherwise unchanged AP portable chest radiograph, support
apparatus including endotracheal tube and esophagogastric tube, with
patchy bilateral heterogeneous airspace opacity.

## 2020-08-16 MED ORDER — AMIODARONE HCL IN DEXTROSE 360-4.14 MG/200ML-% IV SOLN
60.0000 mg/h | INTRAVENOUS | Status: DC
  Administered 2020-08-16: 60 mg/h via INTRAVENOUS
  Filled 2020-08-16: qty 200

## 2020-08-16 MED ORDER — INSULIN ASPART 100 UNIT/ML ~~LOC~~ SOLN
6.0000 [IU] | Freq: Once | SUBCUTANEOUS | Status: AC
  Administered 2020-08-16: 6 [IU] via SUBCUTANEOUS
  Filled 2020-08-16: qty 1

## 2020-08-16 MED ORDER — AMIODARONE HCL IN DEXTROSE 360-4.14 MG/200ML-% IV SOLN
60.0000 mg/h | INTRAVENOUS | Status: DC
  Administered 2020-08-16: 30 mg/h via INTRAVENOUS
  Administered 2020-08-17 – 2020-08-19 (×9): 60 mg/h via INTRAVENOUS
  Filled 2020-08-16 (×12): qty 200

## 2020-08-16 MED ORDER — INSULIN DETEMIR 100 UNIT/ML ~~LOC~~ SOLN: 53.0000 [IU] | Freq: Two times a day (BID) | SUBCUTANEOUS | Status: DC

## 2020-08-16 MED ORDER — AMIODARONE IV BOLUS ONLY 150 MG/100ML
150.0000 mg | Freq: Once | INTRAVENOUS | Status: AC
  Administered 2020-08-16: 150 mg via INTRAVENOUS

## 2020-08-16 MED ORDER — PHENYLEPHRINE CONCENTRATED 100MG/250ML (0.4 MG/ML) INFUSION SIMPLE
0.0000 ug/min | INTRAVENOUS | Status: DC
  Administered 2020-08-16: 20 ug/min via INTRAVENOUS
  Filled 2020-08-16 (×2): qty 250

## 2020-08-16 MED ORDER — INSULIN ASPART 100 UNIT/ML ~~LOC~~ SOLN
0.0000 [IU] | SUBCUTANEOUS | Status: DC
  Administered 2020-08-16 (×2): 11 [IU] via SUBCUTANEOUS
  Administered 2020-08-17 (×3): 7 [IU] via SUBCUTANEOUS
  Administered 2020-08-17 (×2): 11 [IU] via SUBCUTANEOUS
  Administered 2020-08-17: 4 [IU] via SUBCUTANEOUS
  Administered 2020-08-18: 7 [IU] via SUBCUTANEOUS
  Administered 2020-08-18: 3 [IU] via SUBCUTANEOUS
  Administered 2020-08-19 (×4): 4 [IU] via SUBCUTANEOUS
  Administered 2020-08-20 – 2020-08-21 (×5): 3 [IU] via SUBCUTANEOUS
  Administered 2020-08-22: 7 [IU] via SUBCUTANEOUS
  Administered 2020-08-22: 11 [IU] via SUBCUTANEOUS
  Administered 2020-08-22: 7 [IU] via SUBCUTANEOUS
  Administered 2020-08-23: 3 [IU] via SUBCUTANEOUS
  Administered 2020-08-23 (×2): 7 [IU] via SUBCUTANEOUS
  Administered 2020-08-23: 4 [IU] via SUBCUTANEOUS
  Administered 2020-08-23 (×2): 7 [IU] via SUBCUTANEOUS
  Administered 2020-08-24 (×3): 3 [IU] via SUBCUTANEOUS
  Administered 2020-08-24 – 2020-08-25 (×3): 4 [IU] via SUBCUTANEOUS
  Administered 2020-08-25: 6 [IU] via SUBCUTANEOUS
  Administered 2020-08-25 (×4): 4 [IU] via SUBCUTANEOUS
  Administered 2020-08-26: 7 [IU] via SUBCUTANEOUS
  Administered 2020-08-26: 4 [IU] via SUBCUTANEOUS
  Administered 2020-08-26: 3 [IU] via SUBCUTANEOUS
  Administered 2020-08-26: 7 [IU] via SUBCUTANEOUS
  Administered 2020-08-26: 4 [IU] via SUBCUTANEOUS
  Administered 2020-08-29 – 2020-08-30 (×3): 3 [IU] via SUBCUTANEOUS
  Filled 2020-08-16 (×48): qty 1

## 2020-08-16 MED ORDER — HEPARIN SODIUM (PORCINE) 1000 UNIT/ML DIALYSIS
1000.0000 [IU] | INTRAMUSCULAR | Status: DC | PRN
  Administered 2020-08-16: 2000 [IU] via INTRAVENOUS_CENTRAL
  Filled 2020-08-16 (×2): qty 6
  Filled 2020-08-16: qty 2

## 2020-08-16 MED ORDER — AMIODARONE IV BOLUS ONLY 150 MG/100ML
INTRAVENOUS | Status: AC
  Administered 2020-08-16: 150 mg
  Filled 2020-08-16: qty 100

## 2020-08-16 MED ORDER — PRISMASOL BGK 0/2.5 32-2.5 MEQ/L EC SOLN
Status: DC
  Filled 2020-08-16: qty 5000

## 2020-08-16 MED ORDER — HEPARIN BOLUS VIA INFUSION
4000.0000 [IU] | Freq: Once | INTRAVENOUS | Status: AC
  Administered 2020-08-16: 4000 [IU] via INTRAVENOUS
  Filled 2020-08-16: qty 4000

## 2020-08-16 MED ORDER — AMIODARONE HCL IN DEXTROSE 360-4.14 MG/200ML-% IV SOLN
INTRAVENOUS | Status: AC
  Administered 2020-08-16: 60 mg/h via INTRAVENOUS
  Filled 2020-08-16: qty 200

## 2020-08-16 MED ORDER — INSULIN DETEMIR 100 UNIT/ML ~~LOC~~ SOLN
53.0000 [IU] | Freq: Two times a day (BID) | SUBCUTANEOUS | Status: DC
  Administered 2020-08-16 – 2020-08-20 (×7): 53 [IU] via SUBCUTANEOUS
  Filled 2020-08-16 (×10): qty 0.53

## 2020-08-16 MED ORDER — SODIUM CHLORIDE 0.9 % FOR CRRT
INTRAVENOUS_CENTRAL | Status: DC | PRN
  Filled 2020-08-16: qty 1000

## 2020-08-16 MED ORDER — INSULIN ASPART 100 UNIT/ML ~~LOC~~ SOLN
6.0000 [IU] | SUBCUTANEOUS | Status: DC
  Administered 2020-08-16 – 2020-08-21 (×21): 6 [IU] via SUBCUTANEOUS
  Filled 2020-08-16 (×19): qty 1

## 2020-08-16 MED ORDER — SODIUM CHLORIDE 0.9 % IV SOLN
2.0000 g | Freq: Two times a day (BID) | INTRAVENOUS | Status: DC
  Administered 2020-08-16 – 2020-08-17 (×2): 2 g via INTRAVENOUS
  Filled 2020-08-16 (×3): qty 2

## 2020-08-16 MED ORDER — HEPARIN (PORCINE) 25000 UT/250ML-% IV SOLN
1850.0000 [IU]/h | INTRAVENOUS | Status: DC
  Administered 2020-08-16: 1300 [IU]/h via INTRAVENOUS
  Administered 2020-08-17 – 2020-08-18 (×2): 1200 [IU]/h via INTRAVENOUS
  Administered 2020-08-19 – 2020-08-21 (×3): 1300 [IU]/h via INTRAVENOUS
  Administered 2020-08-21 – 2020-08-24 (×3): 1400 [IU]/h via INTRAVENOUS
  Administered 2020-08-25 – 2020-08-26 (×2): 1550 [IU]/h via INTRAVENOUS
  Filled 2020-08-16 (×15): qty 250

## 2020-08-16 MED ORDER — AMIODARONE LOAD VIA INFUSION
150.0000 mg | Freq: Once | INTRAVENOUS | Status: DC
  Filled 2020-08-16: qty 83.34

## 2020-08-16 NOTE — Progress Notes (Signed)
[place] for heparin Indication: atrial fibrillation  Allergies  Allergen Reactions  . Demerol Other (See Comments)    Blood pressure dropped completely out  . Meperidine Other (See Comments)    Drops blood pressure out  Blood pressure dropped completely out  . Meperidine Hcl Other (See Comments)    Drops blood pressure out  . Nitrospan [Nitroglycerin] Other (See Comments)    Blood pressure dropped out  . Sulfa Antibiotics Other (See Comments)    Urinary burning; blisters   . Sulfasalazine Other (See Comments)    Urinary burning; blisters     Patient Measurements: Height: 6\' 4"  (193 cm) Weight: 90.9 kg (200 lb 6.4 oz) IBW/kg (Calculated) : 86.8 Heparin Dosing Weight: 90 kg  Vital Signs: Temp: 99.5 F (37.5 C) (01/13 2230) BP: 108/71 (01/13 2230) Pulse Rate: 63 (01/13 2200)  Labs: Recent Labs    08/14/20 0957 08/14/20 1758 08/15/20 0458 08/15/20 0831 08/16/20 0416 08/16/20 1339 08/16/20 1513 08/16/20 2144  HGB 14.0  --  13.0  --  12.6*  --   --   --   HCT 40.9  --  36.9*  --  36.5*  --   --   --   PLT 257  --  208  --  158  --   --   --   HEPARINUNFRC  --   --   --   --   --   --   --  0.73*  CREATININE 3.14*   < >  --  4.59* 5.74*  --  6.12*  --   CKMB  --   --   --   --   --  28.3*  --   --   TROPONINIHS  --   --   --   --   --  1,874* 1,755*  --    < > = values in this interval not displayed.    Estimated Creatinine Clearance: 13.8 mL/min (A) (by C-G formula based on SCr of 6.12 mg/dL (H)).   Medical History: Past Medical History:  Diagnosis Date  . CAD (coronary artery disease)   . Cataract   . CHICKENPOX, HX OF 08/30/2008   Qualifier: Diagnosis of  By: [name], [name]    . Chronic kidney disease   . Diabetes mellitus   . Glaucoma   . History of urinary calculi 08/30/2008   Qualifier: Diagnosis of  By: [name], Jacelyn Pi   . Hyperlipidemia   . Hypertension   . Renal calculus or stone   . Skin cancer of  nose    ears and hands   Assessment: 70 year old male admitted with severe DKA, which has since resolved and patient now on SQ insulin regimen. Patient with worsening renal function, plan to start HD 1/13. Patient with new afib 1/13, started on amiodarone and heparin.  Goal of Therapy:  Heparin level 0.3-0.7 units/ml Monitor platelets by anticoagulation protocol: Yes   Plan:  Heparin 4000 unit bolus followed by heparin drip at 1300 units/hr. Check HL at 2200. CBC daily while on heparin drip.   0113 2144 HL 0.73, slightly SUPRAtherapeutic.  Will decrease Heparin drip to 1200 units/hr and recheck HL in am.  Monitor CBC daily.   Ena Dawley, PharmD 08/16/2020,10:56 PM

## 2020-08-16 NOTE — Consult Note (Signed)
Cardiology Consultation Note    Patient ID: Nathaniel Thomas, MRN: [id number], DOB/AGE: 01-11-1951 70 y.o. Admit date: 08/07/2020   Date of Consult: 08/16/2020 Primary Physician: Dion Body, MD Primary Cardiologist: callwood  Chief Complaint: covid/dka/respiratory failure Reason for Consultation: afib Requesting MD: Mortimer Fries  HPI: [name] is a 70 y.o. male with history of diabetes, hypertension and hyperlipidemia who was admitted on January 10 of this year after altered mental status and was found to be unresponsive.  He had been diagnosed with COVID 1 week prior.  He had nausea vomiting and diarrhea for several days and was unable to take in medications.  He was noted to be severely acidotic with elevated sugars is consistent with DKA.  He had severe hypothermia and was intubated for severe respiratory failure.  He has a history of being evaluated with a coronary artery CT in 2012 showing noncritical disease.  He developed atrial fibrillation rapid ventricular response today.  Throughout his hospital stay he has been noted to have gradually worsening renal function.  His serum troponins were normal on admission.  He had acute renal injury with a creatinine of 2.01 up from normal creatinine as a base line. Echo in 2019 showed ef of 50%. Has develped worsening renal funciton wit creatine of 6.12. Afib rate is still rapid with rates between 130-150. Pt is intubated and sedated.   Past Medical History:  Diagnosis Date  . CAD (coronary artery disease)   . Cataract   . CHICKENPOX, HX OF 08/30/2008   Qualifier: Diagnosis of  By: [name], [name]    . Chronic kidney disease   . Diabetes mellitus   . Glaucoma   . History of urinary calculi 08/30/2008   Qualifier: Diagnosis of  By: [name], Jacelyn Pi   . Hyperlipidemia   . Hypertension   . Renal calculus or stone   . Skin cancer of nose    ears and hands      Surgical History:  Past Surgical History:  Procedure Laterality Date  .  ANKLE SURGERY     2013/ wears boot cast  . BASAL CELL CARCINOMA EXCISION     hands  . LAMINECTOMY  1983/1988  . Left knee replacement  2002/ 2004  . LITHOTRIPSY     was cancelled/pt passed stone  . MOHS SURGERY     nose  . TONSILLECTOMY       Home Meds: Prior to Admission medications   Medication Sig Start Date End Date Taking? Authorizing Provider  aspirin 81 MG chewable tablet Chew 81 mg by mouth daily. 07/14/20  Yes [provider]  Cholecalciferol (VITAMIN D3) 250 MCG (10000 UT) capsule Take 10,000 Units by mouth daily.   Yes [provider]  empagliflozin (JARDIANCE) 25 MG TABS tablet Take 25 mg by mouth daily. 06/30/20  Yes [provider]  finasteride (PROSCAR) 5 MG tablet Take 5 mg by mouth daily.   Yes [provider]  fluticasone (FLONASE) 50 MCG/ACT nasal spray Place 1 spray into both nostrils 2 (two) times daily. 08/02/20  Yes [provider]  glimepiride (AMARYL) 4 MG tablet Take 4 mg by mouth daily. 05/25/20  Yes [provider]  insulin detemir (LEVEMIR) 100 UNIT/ML injection Inject 25 Units into the skin 2 (two) times daily.   Yes [provider]  lisinopril (ZESTRIL) 5 MG tablet Take 5 mg by mouth daily. 07/13/20  Yes [provider]  LUMIGAN 0.01 % SOLN Place 1 drop into  both eyes at bedtime.   Yes [provider]  metFORMIN (GLUCOPHAGE) 500 MG tablet Take 1,000 mg by mouth 2 (two) times daily. 06/25/20  Yes [provider]  Pitavastatin Calcium 4 MG TABS Take 4 mg by mouth daily. 05/22/20  Yes [provider]  tamsulosin (FLOMAX) 0.4 MG CAPS capsule Take 0.4 mg by mouth daily after supper.  04/16/14  Yes [provider]  zinc gluconate 50 MG tablet Take 50 mg by mouth daily.   Yes [provider]    Inpatient Medications:  . amiodarone  150 mg Intravenous Once  . chlorhexidine gluconate (MEDLINE KIT)  15 mL Mouth Rinse BID  . Chlorhexidine Gluconate  Cloth  6 each Topical Daily  . docusate  100 mg Per Tube BID  . feeding supplement (PROSource TF)  90 mL Per Tube TID  . fentaNYL (SUBLIMAZE) injection  25 mcg Intravenous Once  . insulin aspart  3-9 Units Subcutaneous Q4H  . insulin aspart  4 Units Subcutaneous Q4H  . insulin detemir  50 Units Subcutaneous Q12H  . mouth rinse  15 mL Mouth Rinse 10 times per day  . methylPREDNISolone (SOLU-MEDROL) injection  80 mg Intravenous Q12H  . polyethylene glycol  17 g Per Tube Daily  . sodium chloride flush  3 mL Intravenous Q12H   . sodium chloride    . amiodarone 60 mg/hr (08/16/20 1251)   Followed by  . amiodarone    . ceFEPime (MAXIPIME) IV 2 g (08/16/20 0535)  . dextrose    . famotidine (PEPCID) IV 20 mg (08/16/20 0935)  . feeding supplement (VITAL 1.5 CAL) 30 mL/hr at 08/16/20 0600  . fentaNYL infusion INTRAVENOUS 275 mcg/hr (08/16/20 0828)  . heparin 1,300 Units/hr (08/16/20 1336)  . midazolam    . phenylephrine (NEO-SYNEPHRINE) Adult infusion    . prismasol BGK 2/2.5 dialysis solution    . prismasol BGK 2/2.5 replacement solution    . prismasol BGK 2/2.5 replacement solution      Allergies:  Allergies  Allergen Reactions  . Demerol Other (See Comments)    Blood pressure dropped completely out  . Meperidine Other (See Comments)    Drops blood pressure out  Blood pressure dropped completely out  . Meperidine Hcl Other (See Comments)    Drops blood pressure out  . Nitrospan [Nitroglycerin] Other (See Comments)    Blood pressure dropped out  . Sulfa Antibiotics Other (See Comments)    Urinary burning; blisters   . Sulfasalazine Other (See Comments)    Urinary burning; blisters     Social History   Socioeconomic History  . Marital status: Married    Spouse name: Not on file  . Number of children: 2  . Years of education: Not on file  . Highest education level: Not on file  Occupational History    Comment: Retired  Tobacco Use  . Smoking status: Never Smoker  .  Smokeless tobacco: Former Network engineer and Sexual Activity  . Alcohol use: No  . Drug use: No  . Sexual activity: Yes    Comment: 10 year of marriage  Other Topics Concern  . Not on file  Social History Narrative  . Not on file   Social Determinants of Health   Financial Resource Strain: Not on file  Food Insecurity: Not on file  Transportation Needs: Not on file  Physical Activity: Not on file  Stress: Not on file  Social Connections: Not on file  Intimate Partner Violence: Not on  file     Family History  Problem Relation Age of Onset  . Heart disease Brother        Atrial fibrillation  . Heart disease Father        CHF  . Heart disease Mother   . Heart disease Brother        atrial fib  . Cancer Sister        bone     Review of Systems: A 12-system review of systems was performed and is negative except as noted in the HPI.  Labs: No results for input(s): CKTOTAL, CKMB, TROPONINI in the last 72 hours. Lab Results  Component Value Date   WBC 24.9 (H) 08/16/2020   HGB 12.6 (L) 08/16/2020   HCT 36.5 (L) 08/16/2020   MCV 87.1 08/16/2020   PLT 158 08/16/2020    Recent Labs  Lab 08/25/2020 0950 09/01/2020 1615 08/16/20 0416  NA 142   < > 139  K 4.5   < > 4.8  CL 109   < > 110  CO2 <7*   < > 18*  BUN 51*   < > 99*  CREATININE 2.01*   < > 5.74*  CALCIUM 9.0   < > 5.8*  PROT 8.1  --   --   BILITOT 1.9*  --   --   ALKPHOS 72  --   --   ALT 22  --   --   AST 32  --   --   GLUCOSE 487*   < > 243*   < > = values in this interval not displayed.   Lab Results  Component Value Date   CHOL 170 09/03/2016   HDL 37 (L) 09/03/2016   LDLCALC 86 09/03/2016   TRIG 310 (H) 08/15/2020   Lab Results  Component Value Date   DDIMER >20.00 (H) 08/15/2020    Radiology/Studies:  DG Abdomen 1 View  Result Date: 08/15/2020 CLINICAL DATA:  Nasogastric tube placement. EXAM: ABDOMEN - 1 VIEW COMPARISON:  Abdominal radiographs 10/09/2016. Abdominal CT 06/15/2018.  FINDINGS: 1353 hours. Enteric tube projects below the diaphragm, with tip overlying the gastric fundal region. Side hole is near the gastroesophageal junction. The visualized bowel gas pattern is normal. There are degenerative changes within the thoracolumbar spine. IMPRESSION: Enteric tube projects to the level of the proximal stomach. Electronically Signed   By: Richardean Sale M.D.   On: 08/16/2020 14:07   CT Head Wo Contrast  Result Date: 08/27/2020 CLINICAL DATA:  Change in mental status EXAM: CT HEAD WITHOUT CONTRAST TECHNIQUE: Contiguous axial images were obtained from the base of the skull through the vertex without intravenous contrast. COMPARISON:  None. FINDINGS: Brain: No evidence of acute infarction, hemorrhage, hydrocephalus, extra-axial collection or mass lesion/mass effect. Vascular: No hyperdense vessel or unexpected calcification. Skull: Normal. Negative for fracture or focal lesion. Sinuses/Orbits: No acute finding.  Left cataract resection. Other: Pervasive motion artifact to a moderate degree. IMPRESSION: Negative motion degraded head CT. Electronically Signed   By: Monte Fantasia M.D.   On: 08/22/2020 11:11   US RENAL  Result Date: 08/14/2020 CLINICAL DATA:  Acute renal failure EXAM: RENAL / URINARY TRACT ULTRASOUND COMPLETE COMPARISON:  Ultrasound kidneys 04/13/2019 MRI abdomen 07/06/2020 FINDINGS: Right Kidney: Renal measurements: 11.0 x 6.9 x 6.1 = volume: 243 mL. 2.3 cm hypoechoic structure in the anterior right kidney likely a simple cyst given appearance on recent MRI. Echogenicity within normal limits. No suspicious mass or hydronephrosis visualized. Left Kidney: Renal measurements:  11.5 x 6.7 x 1.5 = volume: 302 mL. Echogenicity within normal limits. No mass or hydronephrosis visualized. 12 mm linear echogenicity in the lower pole suspicious for nonobstructing calculus. Bladder: Unable to evaluate as it is collapsed around a Foley catheter balloon. Other: None. IMPRESSION: 1.  No hydronephrosis. 2. Nonobstructing left renal calculus. Electronically Signed   By: Miachel Roux M.D.   On: 08/14/2020 08:16   DG Chest Port 1 View  Result Date: 08/16/2020 CLINICAL DATA:  Central line placement EXAM: PORTABLE CHEST 1 VIEW COMPARISON:  08/15/2020 FINDINGS: Interval placement of left neck vascular catheter, tip positioned over the superior SVC. Otherwise unchanged AP portable chest radiograph, support apparatus including endotracheal tube and esophagogastric tube, with patchy bilateral heterogeneous airspace opacity. The heart and mediastinum are unremarkable. IMPRESSION: 1. Interval placement of left neck vascular catheter, tip positioned over the superior SVC. 2. Otherwise unchanged AP portable chest radiograph, support apparatus including endotracheal tube and esophagogastric tube, with patchy bilateral heterogeneous airspace opacity. Electronically Signed   By: Eddie Candle M.D.   On: 08/16/2020 10:10   DG Chest Port 1 View  Result Date: 08/15/2020 CLINICAL DATA:  Acute respiratory failure with hypoxia EXAM: PORTABLE CHEST 1 VIEW COMPARISON:  08/14/2020 FINDINGS: Endotracheal tube and NG tube are unchanged. Bibasilar atelectasis or infiltrates. No effusions. Heart is normal size. No acute bony abnormality. IMPRESSION: Bibasilar atelectasis or infiltrates. Electronically Signed   By: Rolm Baptise M.D.   On: 08/15/2020 03:01   DG Chest Port 1 View  Result Date: 08/14/2020 CLINICAL DATA:  Endotracheal tube placement EXAM: PORTABLE CHEST 1 VIEW COMPARISON:  August 13, 2020 FINDINGS: The endotracheal tube terminates approximately 6 cm above the carina. The enteric tube extends below the left hemidiaphragm. There is no definite pneumothorax or large pleural effusion. There are increasing airspace opacities at the left lung base and in the retrocardiac region. The heart size is stable. Aortic calcifications are noted. IMPRESSION: 1. Lines and tubes as above. 2. Increasing airspace  opacities at the left lung base and within the retrocardiac region suspicious for atelectasis or developing pneumonia. Electronically Signed   By: Constance Holster M.D.   On: 08/14/2020 00:24   DG Chest Port 1 View  Result Date: 08/28/2020 CLINICAL DATA:  Attempted central line placement. Assess for pneumothorax EXAM: PORTABLE CHEST 1 VIEW COMPARISON:  08/29/2020 FINDINGS: Endotracheal tube is 7.6 cm above the carina. NG tube is in the stomach. No visible pneumothorax. Heart is normal size. Bibasilar airspace opacities. No effusions. IMPRESSION: No visible pneumothorax. Bibasilar atelectasis or infiltrates. Electronically Signed   By: Rolm Baptise M.D.   On: 08/22/2020 21:51   DG Chest Portable 1 View  Result Date: 08/09/2020 CLINICAL DATA:  Hypoxia.  Reported nasogastric tube placement EXAM: PORTABLE CHEST 1 VIEW COMPARISON:  August 13, 2020 study obtained earlier in the day FINDINGS: A nasogastric tube is not appreciable on current examination. Endotracheal tube tip is 6.8 cm above the carina. No pneumothorax. Lungs are clear. Heart size and pulmonary vascularity are normal. No adenopathy. There is degenerative change in the thoracic spine. IMPRESSION: Endotracheal tube as described without pneumothorax. No nasogastric tube apparent. Lungs clear. Cardiac silhouette normal. Electronically Signed   By: Lowella Grip III M.D.   On: 08/05/2020 12:59   DG Chest Portable 1 View  Result Date: 08/29/2020 CLINICAL DATA:  Chest pain shortness of breath. Recent COVID-19 diagnosis. EXAM: PORTABLE CHEST 1 VIEW COMPARISON:  03/03/2011 FINDINGS: Midline trachea. Borderline cardiomegaly. No pleural effusion or pneumothorax. Suspect  mild left base scarring. Clear right lung. IMPRESSION: Borderline cardiomegaly, without acute disease. Electronically Signed   By: Abigail Miyamoto M.D.   On: 08/26/2020 10:24    Wt Readings from Last 3 Encounters:  08/15/20 90.9 kg  08/26/17 100.2 kg  08/10/17 100.2 kg    EKG:  afib with rvr  Physical Exam:intubated and sedated.  Blood pressure 133/83, pulse (!) 111, temperature 99.32 F (37.4 C), resp. rate (!) 22, height 6' 4"  (1.93 m), weight 90.9 kg, SpO2 99 %. Body mass index is 24.39 kg/m. General: Well developed, well nourished, in no acute distress. Head: Normocephalic, atraumatic, sclera non-icteric, no xanthomas, nares are without discharge.  Neck: Negative for carotid bruits. JVD not elevated. Lungs: Clear bilaterally to auscultation without wheezes, rales, or rhonchi. Breathing is unlabored. Heart: irr, irr Abdomen: Soft, non-tender, non-distended with normoactive bowel sounds. No hepatomegaly. No rebound/guarding. No obvious abdominal masses. Msk:  Strength and tone appear normal for age. Extremities: No clubbing or cyanosis. No edema.  Distal pedal pulses are 2+ and equal bilaterally. Neuro: intubated and sedated.      Assessment and Plan  70 yo male admitted with covid 19 and respiratory failure. Had preserved lv function by echo several years ago. No intubated with declining renal funciton. Has developed afib with rvr with relative hypotension. Is currently on iv heparin and has received a bolus of amiodaroen and is on a drip. Will give another amiodarone bolus and continue the drip. When rate is controlled, will order echo to reevaluate lv function. Continue with iv heparin.   Elevated troponin-most likey secondary to demand due to respiratory failure and renal insuffiency. Does not appear to be an acute coronary event. Continue hepain and attemmpt to control rate.   Signed, Teodoro Spray MD 08/16/2020, 2:44 PM Pager: [phone]

## 2020-08-16 NOTE — Progress Notes (Addendum)
CRITICAL CARE NOTE  70 y.o.malewith PMH as noted below who presents with altered mental status after he was found unresponsive this morning.   Per EMS he was initially hypoxic although this appears to have resolved. The patient himself is unable to give any history.  Patient Dx with COVID 1 week ago +NVD for several days ER shows severe acidosis with elevated sugars Patient with severe hypothermia and intubated for severe resp failure  1/10 admitted to ICU, severe DKA, gastroentertitis 1/11 severe resp failure 1/12 severe DKA 1/13 severe resp failure, worsening Renal failure   CC  follow up respiratory failure  SUBJECTIVE Patient remains critically ill Prognosis is guarded   BP 115/72   Pulse 97   Temp 99.32 F (37.4 C)   Resp (!) 22   Ht 6\' 4"  (1.93 m)   Wt 90.9 kg   SpO2 100%   BMI 24.39 kg/m    I/O last 3 completed shifts: In: 6058 [I.V.:5339.4; IV Piggyback:718.5] Out: 1610 [Urine:1315] Total I/O In: 200.6 [I.V.:156.8; NG/GT:43.8] Out: 80 [Urine:80]  SpO2: 100 % FiO2 (%): 28 %  Estimated body mass index is 24.39 kg/m as calculated from the following:   Height as of this encounter: 6\' 4"  (1.93 m).   Weight as of this encounter: 90.9 kg.  SIGNIFICANT EVENTS   REVIEW OF SYSTEMS  PATIENT IS UNABLE TO PROVIDE COMPLETE REVIEW OF SYSTEMS DUE TO SEVERE CRITICAL ILLNESS   Pressure Injury 08/15/20 Sacrum Medial;Lower Deep Tissue Pressure Injury - Purple or maroon localized area of discolored intact skin or blood-filled blister due to damage of underlying soft tissue from pressure and/or shear. bruised looking non-blanchabl (Active)  08/15/20 1200  Location: Sacrum  Location Orientation: Medial;Lower  Staging: Deep Tissue Pressure Injury - Purple or maroon localized area of discolored intact skin or blood-filled blister due to damage of underlying soft tissue from pressure and/or shear.  Wound Description (Comments): bruised looking non-blanchable  purple on soft tissue, Serous blisters bilaterally  Present on Admission:     PHYSICAL EXAMINATION: GENERAL:critically ill appearing, +resp distress NECK: Supple.  PULMONARY: +rhonchi, +wheezing CARDIOVASCULAR: S1 and S2.  GASTROINTESTINAL: Soft, nontender, +Positive bowel sounds.  MUSCULOSKELETAL: No swelling, clubbing, or edema.  NEUROLOGIC: obtunded, GCS<8 SKIN:+sacral decub  Patient assessed or the symptoms described in the history of present illness.  In the context of the Global COVID-19 pandemic, which necessitated consideration that the patient might be at risk for infection with the SARS-CoV-2 virus that causes COVID-19, Institutional protocols and algorithms that pertain to the evaluation of patients at risk for COVID-19 are in a state of rapid change based on information released by regulatory bodies including the CDC and federal and state organizations. These policies and algorithms were followed during the patient's care while in hospital.    MEDICATIONS: I have reviewed all medications and confirmed regimen as documented   CULTURE RESULTS   Recent Results (from the past 240 hour(s))  Blood Culture (routine x 2)     Status: None (Preliminary result)   Collection Time: 09/09/20  9:50 AM   Specimen: BLOOD  Result Value Ref Range Status   Specimen Description BLOOD LEFT [facility]  Final   Special Requests   Final    BOTTLES DRAWN AEROBIC AND ANAEROBIC Blood Culture adequate volume   Culture   Final    NO GROWTH 2 DAYS Performed at [facility], [address] [place], [place] [address]    Report Status PENDING  Incomplete  Blood Culture (routine x 2)  Status: None (Preliminary result)   Collection Time: 08/25/2020  9:50 AM   Specimen: BLOOD  Result Value Ref Range Status   Specimen Description BLOOD RIGHT AC  Final   Special Requests   Final    BOTTLES DRAWN AEROBIC AND ANAEROBIC Blood Culture results may not be optimal due to an excessive volume of blood  received in culture bottles   Culture   Final    NO GROWTH 2 DAYS Performed at [facility], [address] [place], [place] [address]    Report Status PENDING  Incomplete  Urine culture     Status: None   Collection Time: 09/02/2020 11:36 AM   Specimen: Urine, Random  Result Value Ref Range Status   Specimen Description   Final    URINE, RANDOM Performed at [facility], [address] [place], [place] [address]    Special Requests   Final    NONE Performed at [facility], [address] [place] [address] [place], [place] [address]    Culture   Final    NO GROWTH Performed at [facility], [place] [address], [place],  [address]    Report Status 08/14/2020 FINAL  Final          IMAGING    No results found.   Nutrition Status: Nutrition Problem: Moderate Malnutrition Etiology: chronic illness (CKD) Signs/Symptoms: mild fat depletion,mild muscle depletion Interventions: Tube feeding     Indwelling Urinary Catheter continued, requirement due to   Reason to continue Indwelling Urinary Catheter strict Intake/Output monitoring for hemodynamic instability         Ventilator continued, requirement due to severe respiratory failure   Ventilator Sedation RASS 0 to -2      ASSESSMENT AND PLAN SYNOPSIS  Severe ACUTE Hypoxic and Hypercapnic Respiratory Failure due to severe metabolic acidosis due to severe DKA due to severe viral gastroenteritis due to COVID 19 infection complicated by acute renal failure Dx of DKA with diabetic coma  Acute hypoxemic respiratory failure due to COVID-19 pneumonia / ARDS Mechanical ventilation via ARDS protocol, target PRVC 6 cc/kg Wean PEEP and FiO2 as able VAP prevention order set Remdesivir  IV STEROIDS   Severe ACUTE Hypoxic and Hypercapnic Respiratory Failure -continue Full MV support -continue Bronchodilator Therapy -Wean Fio2 and PEEP as tolerated -VAP/VENT bundle implementation   ACUTE KIDNEY  INJURY/Renal Failure -continue Foley Catheter-assess need -Avoid nephrotoxic agents -Follow urine output, BMP -Ensure adequate renal perfusion, optimize oxygenation -Renal dose medications Plan for vasc cath placement and HD today     NEUROLOGY Acute toxic metabolic encephalopathy, need for sedation Goal RASS -2 to -3  SHOCK-SEPSIS/HYPOVOLUMIC -use vasopressors to keep MAP>65 -follow ABG and LA -follow up cultures -emperic ABX  CARDIAC ICU monitoring  ID -continue IV abx as prescibed -follow up cultures  GI GI PROPHYLAXIS as indicated   DIET-->TF's as tolerated Constipation protocol as indicated  ENDO - will use ICU hypoglycemic\Hyperglycemia protocol if indicated     ELECTROLYTES -follow labs as needed -replace as needed -pharmacy consultation and following   DVT/GI PRX ordered and assessed TRANSFUSIONS AS NEEDED MONITOR FSBS I Assessed the need for Labs I Assessed the need for Foley I Assessed the need for Central Venous Line Family Discussion when available I Assessed the need for Mobilization I made an Assessment of medications to be adjusted accordingly Safety Risk assessment completed   CASE DISCUSSED IN MULTIDISCIPLINARY ROUNDS WITH ICU TEAM  Critical Care Time devoted to patient care services described in this  note is 55 minutes.   Overall, patient is critically ill, prognosis is guarded.  Patient with Multiorgan failure and at high risk for cardiac arrest and death.    Corrin Parker, M.D.  [facility]  Medical Director [place] Director [facility] Cardio-Pulmonary Department

## 2020-08-16 NOTE — Progress Notes (Signed)
   WOC Nurse Consult Note: Reason for Consult: Patient noted to have deep tissue pressure injury (deeply hued discoloration with blistering) at the coccyx last evening. Patient is critically ill, on ventilator with an admission Braden Scale Score for Pressure Sore/Injury Risk of 9 (very high risk). Today that Score is an 11 (very high risk). Other comorbid conditions included DM, Acute respiratory failure with hypoxia, Covid+, ARF, sepsis. RD has been consulted. Note: Patient was found unresponsive at home on 1/11. Skin injury noted on 1/12 within 32 hours of admission.  Wound type:Pressure Pressure Injury POA: Yes Measurement:To be obtained by Bedside RN today and documented on the Nursing Flow Sheet. Wound PZW:CHENID hued area of red/purple/maroon with blistering. This description is consistent with that of a deep tissue pressure injury. The blistering over the injury is indicative of the initial phases of progression and this area may evolve to a full thickness tissue injury. Drainage (amount, consistency, odor) scant serous Periwound:intact Dressing procedure/placement/frequency: I have provided Nursing with guidance for the care of this area in the presence of the need for [facility] elevation for respiratory needs using an antimicrobial nonadherent dressing (xeroform) to the wound bed and a silicone foam cover dressing that mitigates friction and shear. This will be changed daily. Turning and repositioning is in place in an attempt to offload.  I have additionally provided bilateral pressure redistribution heel boots.  [facility] nursing team will not follow, but will remain available to this patient, the nursing and medical teams.  Please re-consult if needed and consider consultation with surgery if clinical signs of worsening present such as increased dimensions, drainage, periwound erythema, warmth, or induration.

## 2020-08-16 NOTE — Progress Notes (Addendum)
[address] [place], [place] [address] Phone [phone]. Fax [phone]  Date: 08/16/2020                  Patient Name:  Nathaniel Thomas  MRN: [id number]  DOB: 12-01-1950  Age / Sex: 70 y.o., male         PCP: Dion Body, MD                  Presenting Illness: Patient is a 70 y.o. male  admitted to [facility] on 08/20/2020 for evaluation of Metabolic acidosis [D35.7] Encounter for central line placement [Z45.2] Acute renal failure (ARF) ([place]) [N17.9] Acute respiratory failure with hypoxia ([place]) [J96.01] Sepsis, due to unspecified organism, unspecified whether acute organ dysfunction present ([place]) [A41.9] COVID-19 [U07.1]   Patient presented to the ER via EMS from home for altered mental status.  Recently treated for COVID.  Per triage notes, patient was prescribed antibiotics for UTI but is unable to take due to lethargy.  Upon arrival patient was responsive to painful stimuli. Also reported to have nausea vomiting and diarrhea for several days prior to admission.  Initial evaluation showed severe acidosis with elevated blood sugars.  Also noted to have hypothermia.  He was intubated for severe acute respiratory failure.  Initially on presentation, pH of 6.99. Patient was treated with ivermectin as outpatient for COVID.  He is unvaccinated.  Hospital course: 08/15/2020:-Patient being monitored in the ICU.  Ventilator dependent.  FiO2 35%.  Urine output has decreased significantly.  Daughter at bedside. 1/13- Vascath placed. Plan for HD today. TF _0  cc/min. Sedated. No pressors  01/12 0701 - 01/13 0700 In: 4052.1 [I.V.:3754.9; NG/GT:193.8; IV Piggyback:103.5] Out: 180 [Urine:180]    Vital Signs: Blood pressure 133/83, pulse (!) 111, temperature 99.32 F (37.4 C), resp. rate (!) 22, height 6' 4" (1.93 m), weight 90.9 kg, SpO2 99 %.   Intake/Output Summary (Last 24 hours) at 08/16/2020 0917 Last data filed at 08/16/2020 0600 Gross per 24 hour  Intake 1416.16 ml   Output 175 ml  Net 1241.16 ml    Weight trends: Filed Weights   08/07/2020 0947 08/15/20 0500  Weight: 90.7 kg 90.9 kg    Physical Exam: General:  Critically ill-appearing,  HEENT  ET tube in place, eyes rolled upward  Lungs:  Ventilator assisted, FiO2 100%  Heart::  Tachycardic, irregular  Abdomen:  Soft, nontender  Extremities:  Trace edema  Neurologic:  Sedated  Skin:  Warm, dry  Access: Rt IJ temp Cath 1/13- ICU team  Foley:  In place       Lab results: Basic Metabolic Panel: Recent Labs  Lab 08/14/20 0300 08/14/20 0957 08/14/20 1758 08/14/20 1959 08/15/20 0831 08/16/20 0416  NA 143   < > 140  --  142 139  K 3.1*   < > 4.4  --  4.5 4.8  CL 113*   < > 107  --  108 110  CO2 17*   < > 23  --  18* 18*  GLUCOSE 316*   < > 204*  --  301* 243*  BUN 62*   < > 67*  --  82* 99*  CREATININE 2.76*   < > 3.63*  --  4.59* 5.74*  CALCIUM 7.8*   < > 6.7*  --  6.3* 5.8*  MG 2.5*  --   --   --   --  2.0  PHOS 1.6*  --   --  5.3*  --  6.5*   < > =  values in this interval not displayed.    Liver Function Tests: Recent Labs  Lab 08/22/2020 0950 08/16/20 0416  AST 32  --   ALT 22  --   ALKPHOS 72  --   BILITOT 1.9*  --   PROT 8.1  --   ALBUMIN 3.6 2.1*   No results for input(s): LIPASE, AMYLASE in the last 168 hours. No results for input(s): AMMONIA in the last 168 hours.  CBC: Recent Labs  Lab 08/15/2020 0950 08/07/2020 1615 08/15/20 0458 08/16/20 0416  WBC 23.2*   < > 27.9* 24.9*  NEUTROABS 16.1*  --   --   --   HGB 16.4   < > 13.0 12.6*  HCT 53.9*   < > 36.9* 36.5*  MCV 97.3   < > 85.2 87.1  PLT 514*   < > 208 158   < > = values in this interval not displayed.    Cardiac Enzymes: No results for input(s): CKTOTAL, TROPONINI in the last 168 hours.  BNP: Invalid input(s): POCBNP  CBG: Recent Labs  Lab 08/15/20 1433 08/15/20 1541 08/15/20 1653 08/15/20 1918 08/15/20 2308  GLUCAP 158* 169* 138* 142* 171*    Microbiology: Recent Results (from the  past 720 hour(s))  Blood Culture (routine x 2)     Status: None (Preliminary result)   Collection Time: 08/15/2020  9:50 AM   Specimen: BLOOD  Result Value Ref Range Status   Specimen Description BLOOD LEFT [facility]  Final   Special Requests   Final    BOTTLES DRAWN AEROBIC AND ANAEROBIC Blood Culture adequate volume   Culture   Final    NO GROWTH 3 DAYS Performed at [facility], [address], [place], [place] [address]    Report Status PENDING  Incomplete  Blood Culture (routine x 2)     Status: None (Preliminary result)   Collection Time: 08/31/2020  9:50 AM   Specimen: BLOOD  Result Value Ref Range Status   Specimen Description BLOOD RIGHT AC  Final   Special Requests   Final    BOTTLES DRAWN AEROBIC AND ANAEROBIC Blood Culture results may not be optimal due to an excessive volume of blood received in culture bottles   Culture   Final    NO GROWTH 3 DAYS Performed at [facility], [address], [place], [place] [address]    Report Status PENDING  Incomplete  Urine culture     Status: None   Collection Time: 08/18/2020 11:36 AM   Specimen: Urine, Random  Result Value Ref Range Status   Specimen Description   Final    URINE, RANDOM Performed at [facility], [address] [place], [place] [address]    Special Requests   Final    NONE Performed at [facility], [address] [place], [place] [address]    Culture   Final    NO GROWTH Performed at [facility], [place] [address], [place], [place] [address]    Report Status 08/14/2020 FINAL  Final     Coagulation Studies: Recent Labs    08/09/2020 0950  LABPROT 15.4*  INR 1.3*    Urinalysis: Recent Labs    08/06/2020 1136  COLORURINE YELLOW*  LABSPEC 1.018  PHURINE 5.0  GLUCOSEU >=500*  HGBUR LARGE*  BILIRUBINUR NEGATIVE  KETONESUR 80*  PROTEINUR 100*  NITRITE NEGATIVE  LEUKOCYTESUR NEGATIVE        Imaging: DG Chest Port 1 View  Result Date:  08/15/2020 CLINICAL  DATA:  Acute respiratory failure with hypoxia EXAM: PORTABLE CHEST 1 VIEW COMPARISON:  08/14/2020 FINDINGS: Endotracheal tube and NG tube are unchanged. Bibasilar atelectasis or infiltrates. No effusions. Heart is normal size. No acute bony abnormality. IMPRESSION: Bibasilar atelectasis or infiltrates. Electronically Signed   By: Rolm Baptise M.D.   On: 08/15/2020 03:01   Scheduled Meds: . chlorhexidine gluconate (MEDLINE KIT)  15 mL Mouth Rinse BID  . Chlorhexidine Gluconate Cloth  6 each Topical Daily  . docusate  100 mg Per Tube BID  . feeding supplement (PROSource TF)  90 mL Per Tube TID  . fentaNYL (SUBLIMAZE) injection  25 mcg Intravenous Once  . heparin  5,000 Units Subcutaneous Q8H  . insulin aspart  3-9 Units Subcutaneous Q4H  . insulin aspart  4 Units Subcutaneous Q4H  . insulin detemir  50 Units Subcutaneous Q12H  . mouth rinse  15 mL Mouth Rinse 10 times per day  . methylPREDNISolone (SOLU-MEDROL) injection  80 mg Intravenous Q12H  . polyethylene glycol  17 g Per Tube Daily  . sodium chloride flush  3 mL Intravenous Q12H   Continuous Infusions: . sodium chloride    . ceFEPime (MAXIPIME) IV 2 g (08/16/20 0535)  . dextrose    . famotidine (PEPCID) IV Stopped (08/15/20 1044)  . feeding supplement (VITAL 1.5 CAL) 30 mL/hr at 08/16/20 0600  . fentaNYL infusion INTRAVENOUS 275 mcg/hr (08/16/20 0828)  . midazolam    . norepinephrine (LEVOPHED) Adult infusion Stopped (08/15/20 1220)   PRN Meds:.sodium chloride, acetaminophen, dextrose, docusate sodium, fentaNYL, heparin, midazolam, midazolam, ondansetron (ZOFRAN) IV, polyethylene glycol, sodium chloride flush   Assessment & Plan: Pt is a 70 y.o.   male with coronary artery disease, diabetes, glaucoma, history of kidney stones, hyperlipidemia, hypertension, left knee replacement, lithotripsy, basal cell carcinoma, was admitted on 3/78/5885 with Metabolic acidosis [O27.7] Encounter for central line placement [Z45.2] Acute renal  failure (ARF) ([place]) [N17.9] Acute respiratory failure with hypoxia ([place]) [J96.01] Sepsis, due to unspecified organism, unspecified whether acute organ dysfunction present ([place]) [A41.9] COVID-19 [U07.1]   #Acute kidney injury Baseline creatinine 1.0 from August 02, 2020 Presenting creatinine of 2.15 Lab Results  Component Value Date   CREATININE 5.74 (H) 08/16/2020   CREATININE 4.59 (H) 08/15/2020   CREATININE 3.63 (H) 08/14/2020     #Acute respiratory failure Requiring ventilator support.  Intubated 08/14/2020  #COVID-19 pneumonia Treated with ivermectin as outpatient  #Diabetes type 2 Hemoglobin A1c 7.9% from June 26, 2020  #Severe acidosis Agree with IV bicarbonate infusion  #Hypokalemia, previously hyperkalemia  Plan: Aggressive care as per ICU protocols Replace electrolytes per ICU protocol -Avoid ACE inhibitor/ARB -Avoid nonsteroidals -Avoid IV contrast exposure -Avoid hypotension BUN creatinine continue to worsen with decreased UOP. Concern about uremia. Will plan for HD today. Case discussed with daughter yesterday and she agreed to proceed with HD Late entry at 2:52 PM.:  Patient's condition suddenly worsened with tachycardia and heart rate in the 150s to 160s.  Started on IV amiodarone.  High risk of complications.    LOS: 3 Onnika Siebel 1/13/20229:17 AM    Note: This note was prepared with Dragon dictation. Any transcription errors are unintentional

## 2020-08-16 NOTE — Consult Note (Signed)
WOC Nurse Consult Note: Reason for Consult: Patient noted to have deep tissue pressure injury (deeply hued discoloration with blistering) at the coccyx last evening. Patient is critically ill, on ventilator with an admission Braden Scale Score for Pressure Sore/Injury Risk of 9 (very high risk). Today that Score is an 11 (very high risk). Other comorbid conditions included DM, Acute respiratory failure with hypoxia, Covid+, ARF, sepsis. RD has been consulted. Note: Patient was found unresponsive at home on 1/11. Skin injury noted on 1/12 within 32 hours of admission.  Wound type:Pressure Pressure Injury POA: Yes Measurement:To be obtained by Bedside RN today and documented on the Nursing Flow Sheet. Wound PYK:DXIPJA hued area of red/purple/maroon with blistering. This description is consistent with that of a deep tissue pressure injury. The blistering over the injury is indicative of the initial phases of progression and this area may evolve to a full thickness tissue injury. Drainage (amount, consistency, odor) scant serous Periwound:intact Dressing procedure/placement/frequency: I have provided Nursing with guidance for the care of this area in the presence of the need for [facility] elevation for respiratory needs using an antimicrobial nonadherent dressing (xeroform) to the wound bed and a silicone foam cover dressing that mitigates friction and shear. This will be changed daily. Turning and repositioning is in place in an attempt to offload.  I have additionally provided bilateral pressure redistribution heel boots.  [facility] nursing team will not follow, but will remain available to this patient, the nursing and medical teams.  Please re-consult if needed and consider consultation with surgery if clinical signs of worsening present such as increased dimensions, drainage, periwound erythema, warmth, or induration.  I have communicated a request to Dr. Mortimer Fries via Secure Chat to photograph the sacrococcygeal area  and upload to the EMR.   Thanks, Maudie Flakes, MSN, RN, [place], Arther Abbott  Pager# [phone]

## 2020-08-16 NOTE — Procedures (Signed)
Central Venous Catheter Insertion Procedure Note  Nathaniel Thomas  [id number]  [birth year]  Date:08/16/20  Time:9:13 AM   Provider Performing:Raphael Fitzpatrick D Dewaine Conger   Procedure: Insertion of Non-tunneled Central Venous Catheter(36556)with US guidance (68115)    Indication(s) Hemodialysis  Consent Risks of the procedure as well as the alternatives and risks of each were explained to the patient and/or caregiver.  Consent for the procedure was obtained and is signed in the bedside chart  Anesthesia Topical only with 1% lidocaine   Timeout Verified patient identification, verified procedure, site/side was marked, verified correct patient position, special equipment/implants available, medications/allergies/relevant history reviewed, required imaging and test results available.  Sterile Technique Maximal sterile technique including full sterile barrier drape, hand hygiene, sterile gown, sterile gloves, mask, hair covering, sterile ultrasound probe cover (if used).  Procedure Description Area of catheter insertion was cleaned with chlorhexidine and draped in sterile fashion.   With real-time ultrasound guidance a HD catheter was placed into the left internal jugular vein.  Nonpulsatile blood flow and easy flushing noted in all ports.  The catheter was sutured in place and sterile dressing applied.  Complications/Tolerance None; patient tolerated the procedure well. Chest X-ray is ordered to verify placement for internal jugular or subclavian cannulation.  Chest x-ray is not ordered for femoral cannulation.  EBL Minimal  Specimen(s) None   Line was secured at the 20 cm mark.  BIOPATCH applied to the insertion site.    Darel Hong, AGACNP-BC [facility] Pager: [phone]

## 2020-08-16 NOTE — Progress Notes (Addendum)
Inpatient Diabetes Program Recommendations  AACE/ADA: New Consensus Statement on Inpatient Glycemic Control   Target Ranges:  Prepandial:   less than 140 mg/dL      Peak postprandial:   less than 180 mg/dL (1-2 hours)      Critically ill patients:  140 - 180 mg/dL   Results for [name], Nathaniel Thomas (MRN [id number]) as of 08/16/2020 10:53  Ref. Range 08/15/2020 17:56 08/15/2020 19:18 08/15/2020 19:50 08/15/2020 21:08 08/15/2020 22:08 08/15/2020 23:08 08/16/2020 00:35 08/16/2020 03:38 08/16/2020 07:44  Glucose-Capillary Latest Ref Range: 70 - 99 mg/dL 141 (H) 142 (H) 129 (H) 152 (H) 154 (H) 171 (H) 182 (H) 198 (H) 197 (H)   Review of Glycemic Control  Diabetes history:DM2 Outpatient Diabetes medications:Levemir 25 units BID, Amaryl 4 mg daily, Jardiance 25 mg daily, Metformin 1000 mg BID Current orders for Inpatient glycemic control: Levemir 50 units Q12H, Novolog 3-9 units Q4H, Novolog 4 units Q4H for tube feeding; Solumedrol 80 mg Q12H, Vital @ 45 ml/hr   Inpatient Diabetes Program Recommendations:    Insulin: If steroids are continued as ordered, please consider increasing Levemir to 53 units BID and increasing tube feeding coverage to Novolog 6 units Q4H.  Thanks, Barnie Alderman, RN, MSN, CDE Diabetes Coordinator Inpatient Diabetes Program [phone] (Team Pager from 8am to 5pm)

## 2020-08-16 NOTE — Progress Notes (Signed)
Brief Pharmacy Note  Review of meds for CRRT. Change cefepime to 2 g IV q12h. No other meds needing adjustment at this time.  Dorena Bodo, PharmD

## 2020-08-16 NOTE — Progress Notes (Addendum)
[facility] for heparin Indication: atrial fibrillation  Allergies  Allergen Reactions  . Demerol Other (See Comments)    Blood pressure dropped completely out  . Meperidine Other (See Comments)    Drops blood pressure out  Blood pressure dropped completely out  . Meperidine Hcl Other (See Comments)    Drops blood pressure out  . Nitrospan [Nitroglycerin] Other (See Comments)    Blood pressure dropped out  . Sulfa Antibiotics Other (See Comments)    Urinary burning; blisters   . Sulfasalazine Other (See Comments)    Urinary burning; blisters     Patient Measurements: Height: 6\' 4"  (193 cm) Weight: 90.9 kg (200 lb 6.4 oz) IBW/kg (Calculated) : 86.8 Heparin Dosing Weight: 90 kg  Vital Signs: Temp: 99.32 F (37.4 C) (01/13 0600) BP: 133/83 (01/13 0600) Pulse Rate: 111 (01/13 0600)  Labs: Recent Labs    08/14/20 0957 08/14/20 1758 08/15/20 0458 08/15/20 0831 08/16/20 0416  HGB 14.0  --  13.0  --  12.6*  HCT 40.9  --  36.9*  --  36.5*  PLT 257  --  208  --  158  CREATININE 3.14* 3.63*  --  4.59* 5.74*    Estimated Creatinine Clearance: 14.7 mL/min (A) (by C-G formula based on SCr of 5.74 mg/dL (H)).   Medical History: Past Medical History:  Diagnosis Date  . CAD (coronary artery disease)   . Cataract   . CHICKENPOX, HX OF 08/30/2008   Qualifier: Diagnosis of  By: [name], [name]    . Chronic kidney disease   . Diabetes mellitus   . Glaucoma   . History of urinary calculi 08/30/2008   Qualifier: Diagnosis of  By: [name], Jacelyn Pi   . Hyperlipidemia   . Hypertension   . Renal calculus or stone   . Skin cancer of nose    ears and hands     Assessment: 70 year old male admitted with severe DKA, which has since resolved and patient now on SQ insulin regimen. Patient with worsening renal function, plan to start HD 1/13. Patient with new afib 1/13, started on amiodarone and heparin.  Goal of Therapy:  Heparin level  0.3-0.7 units/ml Monitor platelets by anticoagulation protocol: Yes   Plan:  Heparin 4000 unit bolus followed by heparin drip at 1300 units/hr. Check HL at 2200. CBC daily while on heparin drip.   Tawnya Crook, PharmD 08/16/2020,1:01 PM

## 2020-08-17 ENCOUNTER — Inpatient Hospital Stay: Payer: HMO

## 2020-08-17 LAB — RENAL FUNCTION PANEL
Albumin: 1.8 g/dL — ABNORMAL LOW (ref 3.5–5.0)
Anion gap: 13 (ref 5–15)
BUN: 99 mg/dL — ABNORMAL HIGH (ref 8–23)
CO2: 19 mmol/L — ABNORMAL LOW (ref 22–32)
Calcium: 5.9 mg/dL — CL (ref 8.9–10.3)
Chloride: 111 mmol/L (ref 98–111)
Creatinine, Ser: 5.23 mg/dL — ABNORMAL HIGH (ref 0.61–1.24)
GFR, Estimated: 11 mL/min — ABNORMAL LOW (ref >60)
Glucose, Bld: 229 mg/dL — ABNORMAL HIGH (ref 70–99)
Phosphorus: 5.9 mg/dL — ABNORMAL HIGH (ref 2.5–4.6)
Potassium: 4.3 mmol/L (ref 3.5–5.1)
Sodium: 143 mmol/L (ref 135–145)

## 2020-08-17 LAB — BLOOD GAS, ARTERIAL
Acid-base deficit: 11.3 mmol/L — ABNORMAL HIGH (ref 0.0–2.0)
Bicarbonate: 14.9 mmol/L — ABNORMAL LOW (ref 20.0–28.0)
FIO2: 0.55
MECHVT: 450 mL
O2 Saturation: 95.5 %
PEEP: 5 cmH2O
Patient temperature: 37
RATE: 22 resp/min
pCO2 arterial: 34 mmHg (ref 32.0–48.0)
pH, Arterial: 7.25 — ABNORMAL LOW (ref 7.350–7.450)
pO2, Arterial: 91 mmHg (ref 83.0–108.0)

## 2020-08-17 LAB — GLUCOSE, CAPILLARY
Glucose-Capillary: 253 mg/dL — ABNORMAL HIGH (ref 70–99)
Glucose-Capillary: 200 mg/dL — ABNORMAL HIGH (ref 70–99)
Glucose-Capillary: 214 mg/dL — ABNORMAL HIGH (ref 70–99)
Glucose-Capillary: 199 mg/dL — ABNORMAL HIGH (ref 70–99)
Glucose-Capillary: 211 mg/dL — ABNORMAL HIGH (ref 70–99)
Glucose-Capillary: 286 mg/dL — ABNORMAL HIGH (ref 70–99)
Glucose-Capillary: 239 mg/dL — ABNORMAL HIGH (ref 70–99)
Glucose-Capillary: 265 mg/dL — ABNORMAL HIGH (ref 70–99)

## 2020-08-17 LAB — CBC
HCT: 41 % (ref 39.0–52.0)
Hemoglobin: 13.9 g/dL (ref 13.0–17.0)
MCH: 29.9 pg (ref 26.0–34.0)
MCHC: 33.9 g/dL (ref 30.0–36.0)
MCV: 88.2 fL (ref 80.0–100.0)
Platelets: 188 10*3/uL (ref 150–400)
RBC: 4.65 MIL/uL (ref 4.22–5.81)
RDW: 15.5 % (ref 11.5–15.5)
WBC: 22.9 10*3/uL — ABNORMAL HIGH (ref 4.0–10.5)
nRBC: 0 % (ref 0.0–0.2)

## 2020-08-17 LAB — HEPARIN LEVEL (UNFRACTIONATED)
Heparin Unfractionated: 0.54 IU/mL (ref 0.30–0.70)
Heparin Unfractionated: 0.56 IU/mL (ref 0.30–0.70)

## 2020-08-17 LAB — FIBRIN DERIVATIVES D-DIMER (ARMC ONLY): Fibrin derivatives D-dimer (ARMC): >7500 ng/mL (FEU) — ABNORMAL HIGH (ref 0.00–499.00)

## 2020-08-17 LAB — TSH: TSH: 0.064 u[IU]/mL — ABNORMAL LOW (ref 0.350–4.500)

## 2020-08-17 LAB — T4, FREE: Free T4: 0.77 ng/dL (ref 0.61–1.12)

## 2020-08-17 LAB — MAGNESIUM: Magnesium: 2 mg/dL (ref 1.7–2.4)

## 2020-08-17 LAB — C-REACTIVE PROTEIN: CRP: 9.5 mg/dL — ABNORMAL HIGH (ref <1.0)

## 2020-08-17 LAB — HEPATITIS B SURFACE ANTIGEN: Hepatitis B Surface Ag: NONREACTIVE

## 2020-08-17 LAB — FERRITIN: Ferritin: 516 ng/mL — ABNORMAL HIGH (ref 24–336)

## 2020-08-17 IMAGING — US US EXTREM LOW VENOUS
1 series · 13 of 24 positions shown · non-contrast
Comparison: None.

CLINICAL DATA: Lower extremity edema



[Series 1: us venous img lower bilat (dvt) · portal-venous · 13 of 69 slices shown]
[im 1/69]
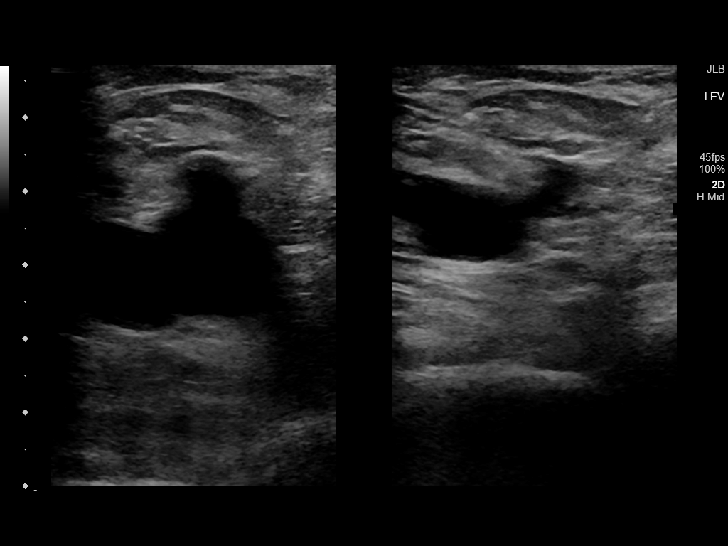
[im 6/69]
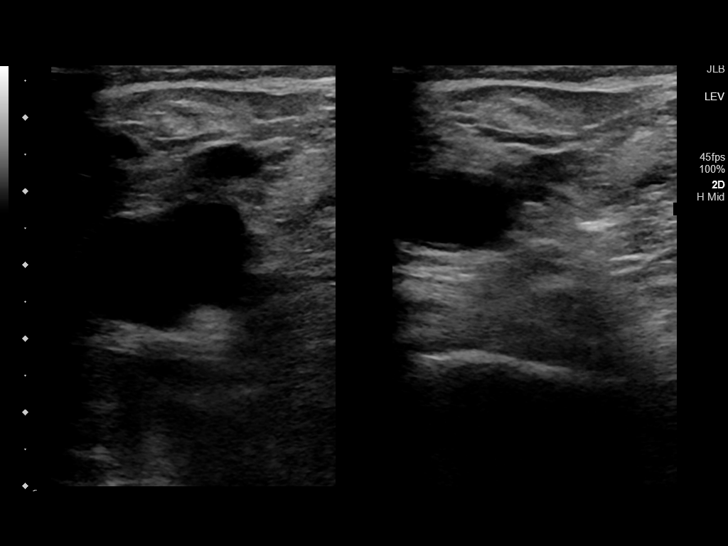
[im 12/69]
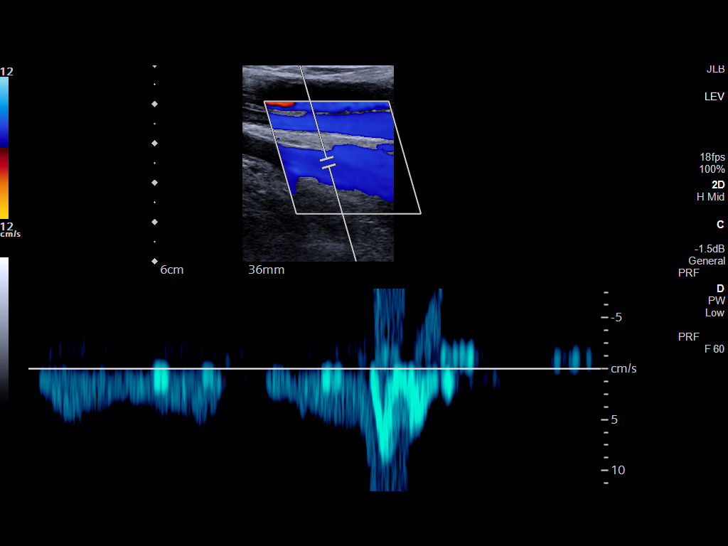
[im 18/69]
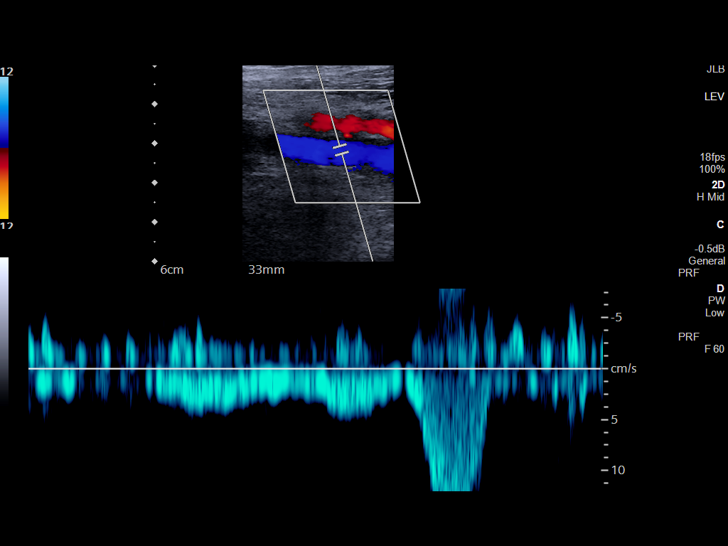
[im 24/69]
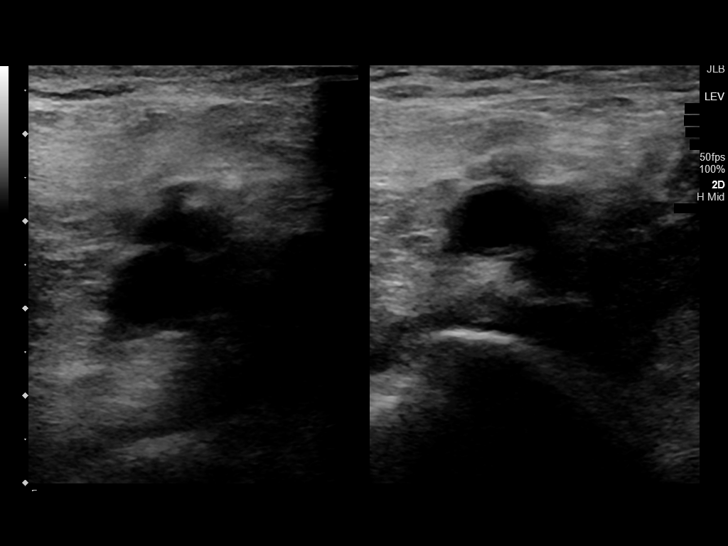
[im 30/69]
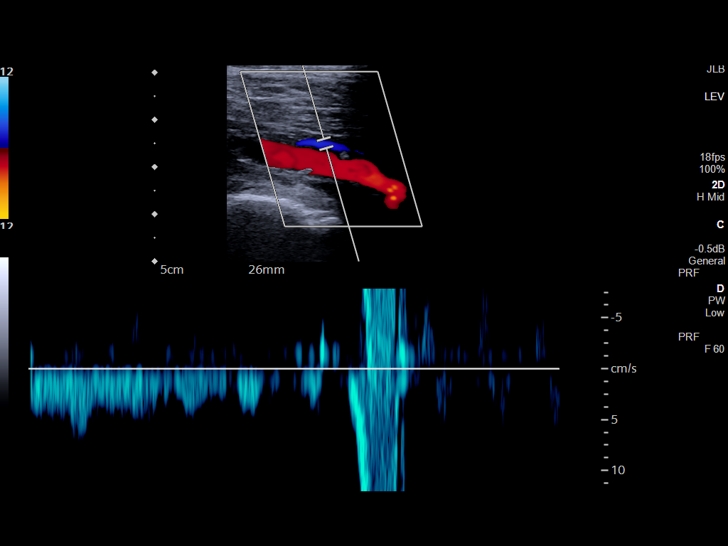
[im 36/69]
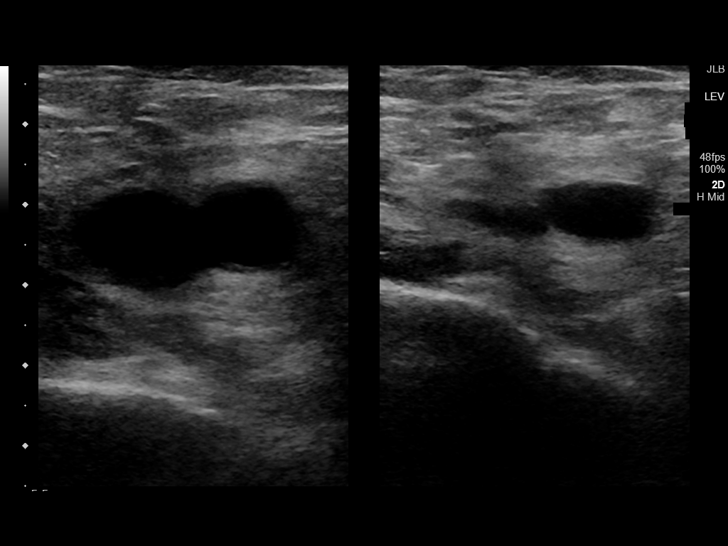
[im 39/69]
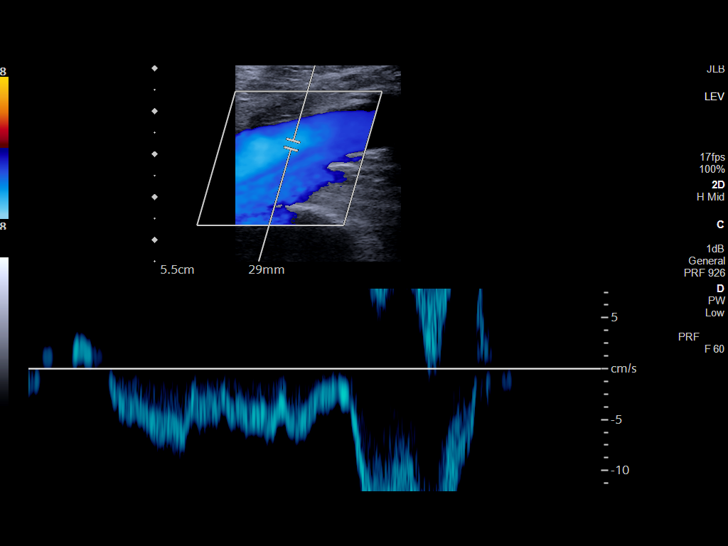
[im 45/69]
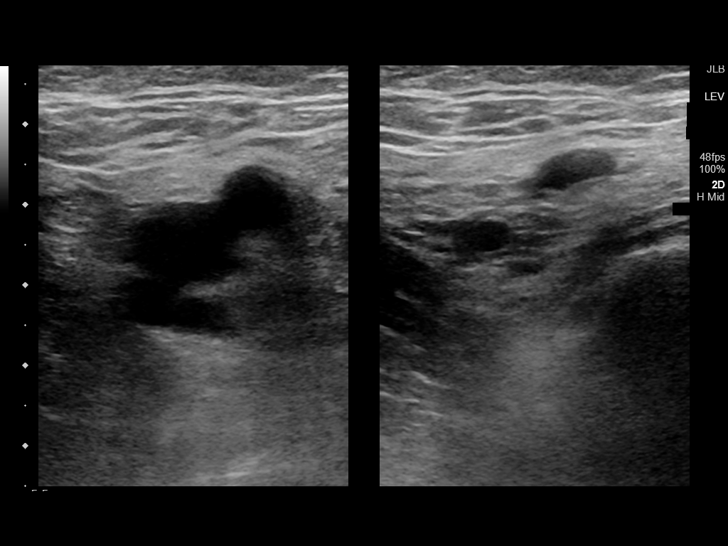
[im 51/69]
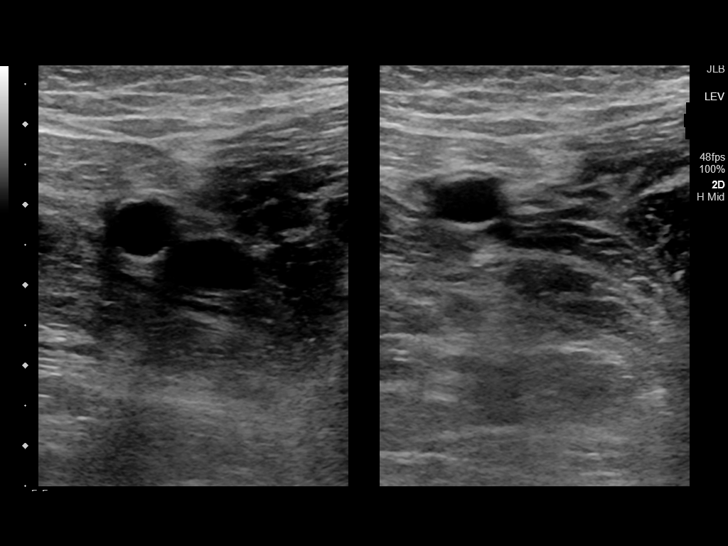
[im 57/69]
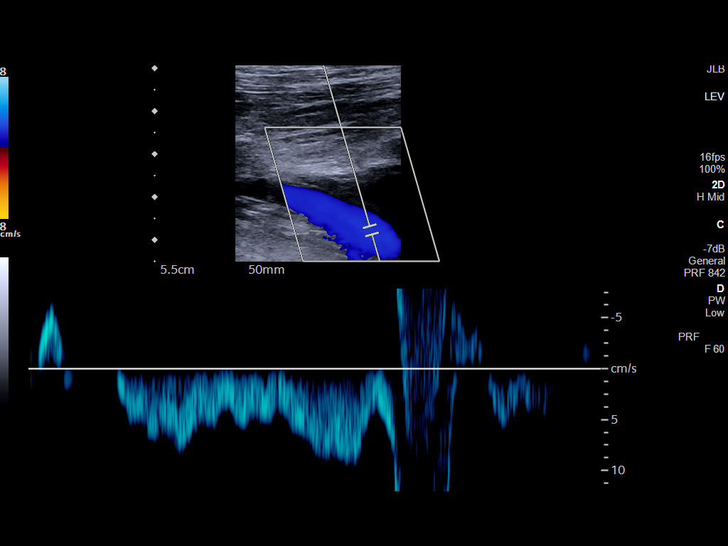
[im 63/69]
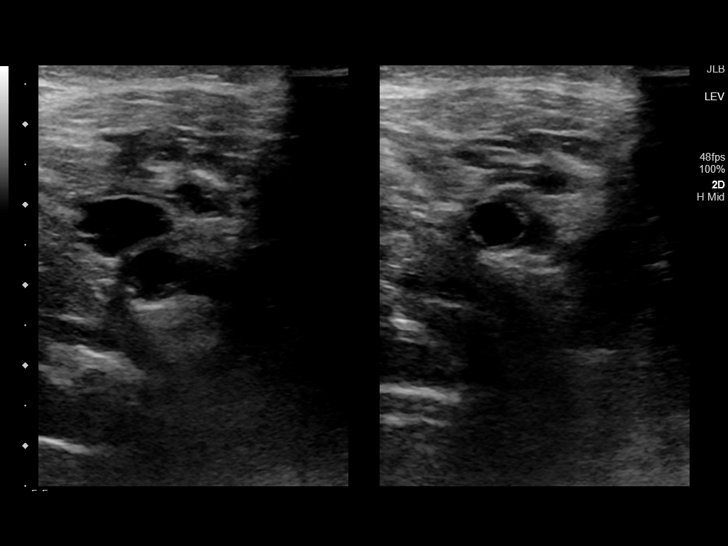
[im 69/69]
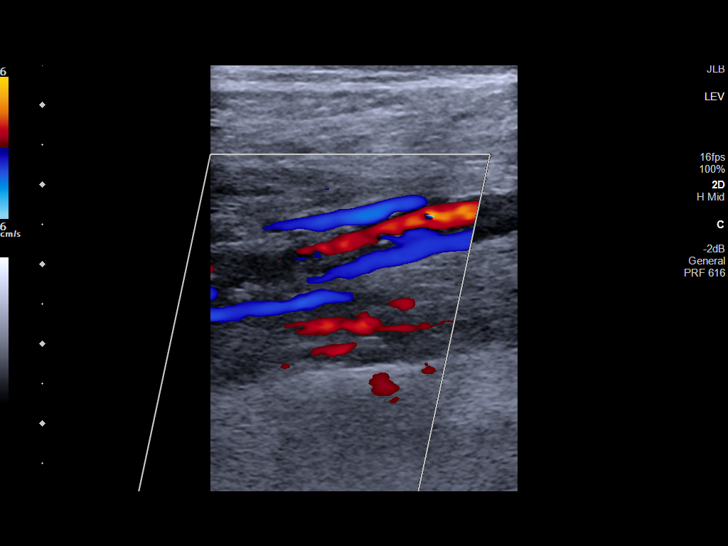

[13 of 24 positions shown; findings below may reference images not displayed]

FINDINGS: RIGHT LOWER EXTREMITY

Common Femoral Vein: No evidence of thrombus. Normal
compressibility, respiratory phasicity and response to augmentation.

Saphenofemoral Junction: No evidence of thrombus. Normal
compressibility and flow on color Doppler imaging.

Profunda Femoral Vein: No evidence of thrombus. Normal
compressibility and flow on color Doppler imaging.

Femoral Vein: No evidence of thrombus. Normal compressibility,
respiratory phasicity and response to augmentation.

Popliteal Vein: No evidence of thrombus. Normal compressibility,
respiratory phasicity and response to augmentation.

Calf Veins: No evidence of thrombus. Normal compressibility and flow
on color Doppler imaging. Limited visualization of peroneal veins.

Superficial Great Saphenous Vein: No evidence of thrombus. Normal
compressibility.

Other Findings:  None.

LEFT LOWER EXTREMITY

Common Femoral Vein: No evidence of thrombus. Normal
compressibility, respiratory phasicity and response to augmentation.

Saphenofemoral Junction: No evidence of thrombus. Normal
compressibility and flow on color Doppler imaging.

Profunda Femoral Vein: No evidence of thrombus. Normal
compressibility and flow on color Doppler imaging.

Femoral Vein: No evidence of thrombus. Normal compressibility,
respiratory phasicity and response to augmentation.

Popliteal Vein: No evidence of thrombus. Normal compressibility,
respiratory phasicity and response to augmentation.

Calf Veins: No evidence of thrombus. Normal compressibility and flow
on color Doppler imaging.Limited visualization of peroneal veins.

Superficial Great Saphenous Vein: No evidence of thrombus. Normal
compressibility.

Other Findings:  None.
IMPRESSION: No evidence of deep venous thrombosis in either lower extremity.

## 2020-08-17 MED ORDER — SODIUM CHLORIDE 0.9 % IV SOLN
3.0000 mg/kg/h | INTRAVENOUS | Status: DC
  Administered 2020-08-17 (×2): 3 mg/kg/h via INTRAVENOUS
  Filled 2020-08-17: qty 250
  Filled 2020-08-17: qty 25
  Filled 2020-08-17: qty 250
  Filled 2020-08-17: qty 25
  Filled 2020-08-17 (×2): qty 250

## 2020-08-17 MED ORDER — POLYETHYLENE GLYCOL 3350 17 G PO PACK: 17.0000 g | PACK | Freq: Every day | ORAL | Status: DC | PRN

## 2020-08-17 MED ORDER — DIGOXIN 0.25 MG/ML IJ SOLN
125.0000 ug | Freq: Once | INTRAMUSCULAR | Status: AC
  Administered 2020-08-17: 125 ug via INTRAVENOUS
  Filled 2020-08-17: qty 2

## 2020-08-17 MED ORDER — ACETAMINOPHEN 325 MG PO TABS
ORAL_TABLET | ORAL | Status: AC
  Filled 2020-08-17: qty 2

## 2020-08-17 MED ORDER — VASOPRESSIN 20 UNITS/100 ML INFUSION FOR SHOCK
0.0000 [IU]/min | INTRAVENOUS | Status: DC
  Administered 2020-08-17: 0.02 [IU]/min via INTRAVENOUS
  Administered 2020-08-17 – 2020-08-18 (×2): 0.03 [IU]/min via INTRAVENOUS
  Filled 2020-08-17 (×3): qty 100

## 2020-08-17 MED ORDER — SODIUM CHLORIDE 0.9 % IV SOLN
2.0000 g | INTRAVENOUS | Status: AC
  Administered 2020-08-18 – 2020-08-20 (×3): 2 g via INTRAVENOUS
  Filled 2020-08-17 (×3): qty 2

## 2020-08-17 MED ORDER — SODIUM BICARBONATE 8.4 % IV SOLN
100.0000 meq | Freq: Once | INTRAVENOUS | Status: AC
  Administered 2020-08-17: 100 meq via INTRAVENOUS

## 2020-08-17 MED ORDER — ACETAMINOPHEN 160 MG/5ML PO SOLN
650.0000 mg | ORAL | Status: DC | PRN
  Administered 2020-08-17: 650 mg via ORAL
  Filled 2020-08-17 (×2): qty 20.3

## 2020-08-17 MED ORDER — SODIUM BICARBONATE 8.4 % IV SOLN
INTRAVENOUS | Status: AC
  Filled 2020-08-17: qty 100

## 2020-08-17 MED ORDER — METOPROLOL TARTRATE 5 MG/5ML IV SOLN: 5.0000 mg | Freq: Once | INTRAVENOUS | Status: AC

## 2020-08-17 MED ORDER — CALCIUM GLUCONATE-NACL 1-0.675 GM/50ML-% IV SOLN
1.0000 g | Freq: Once | INTRAVENOUS | Status: AC
  Administered 2020-08-17: 1000 mg via INTRAVENOUS
  Filled 2020-08-17: qty 50

## 2020-08-17 MED ORDER — DOCUSATE SODIUM 50 MG/5ML PO LIQD
100.0000 mg | Freq: Two times a day (BID) | ORAL | Status: DC | PRN
  Filled 2020-08-17: qty 10

## 2020-08-17 MED ORDER — METOPROLOL TARTRATE 5 MG/5ML IV SOLN
INTRAVENOUS | Status: AC
  Administered 2020-08-17: 5 mg via INTRAVENOUS
  Filled 2020-08-17: qty 5

## 2020-08-17 MED ORDER — LACTATED RINGERS IV SOLN: INTRAVENOUS | Status: AC

## 2020-08-17 MED ORDER — ACETAMINOPHEN 160 MG/5ML PO SOLN
650.0000 mg | ORAL | Status: DC | PRN
  Administered 2020-08-17 – 2020-08-24 (×5): 650 mg
  Filled 2020-08-17 (×7): qty 20.3

## 2020-08-17 MED ORDER — SODIUM CHLORIDE 0.9 % IV SOLN
2.0000 mg/kg/h | INTRAVENOUS | Status: DC
  Administered 2020-08-18: 3 mg/kg/h via INTRAVENOUS
  Administered 2020-08-18: 2 mg/kg/h via INTRAVENOUS
  Filled 2020-08-17 (×11): qty 50

## 2020-08-17 MED ORDER — HYDROCORTISONE NA SUCCINATE PF 100 MG IJ SOLR
50.0000 mg | Freq: Four times a day (QID) | INTRAMUSCULAR | Status: DC
  Administered 2020-08-17 – 2020-08-20 (×14): 50 mg via INTRAVENOUS
  Filled 2020-08-17 (×14): qty 2

## 2020-08-17 NOTE — Progress Notes (Signed)
Pharmacy Antibiotic Note  Nathaniel Thomas is a 70 y.o. male admitted on 08/21/2020 with sepsis in setting of COVID-19 infection. There is concern for superimposed bacterial infection. Patient further with severe DKA. Patient was intubated 1/10 and remains sedated on mechanical ventilation. He is hypotensive requiring blood pressure support with Levophed. Pharmacy has been consulted for cefepime dosing.  Plan: Cefepime 2 g q12h > q24h. No longer on CRRT. Will dose in the evening to give after dialysis when indicated.  Height: 6\' 4"  (193 cm) Weight: 94.3 kg (207 lb 14.3 oz) IBW/kg (Calculated) : 86.8  Temp (24hrs), Avg:100.1 F (37.8 C), Min:98.96 F (37.2 C), Max:101.48 F (38.6 C)  Recent Labs  Lab 08/29/2020 0950 08/14/2020 1136 08/17/2020 1615 08/14/20 0300 08/14/20 0957 08/14/20 1758 08/15/20 0458 08/15/20 0831 08/16/20 0416 08/16/20 1513 08/17/20 0439 08/17/20 0835  WBC 23.2*  --  28.2*  --  34.7*  --  27.9*  --  24.9*  --   --  22.9*  CREATININE 2.01*  --  2.15*   < > 3.14* 3.63*  --  4.59* 5.74* 6.12* 5.23*  --   LATICACIDVEN 4.4* 3.8*  --   --   --   --   --   --   --   --   --   --    < > = values in this interval not displayed.    Estimated Creatinine Clearance: 16.1 mL/min (A) (by C-G formula based on SCr of 5.23 mg/dL (H)).    Allergies  Allergen Reactions  . Demerol Other (See Comments)    Blood pressure dropped completely out  . Meperidine Other (See Comments)    Drops blood pressure out  Blood pressure dropped completely out  . Meperidine Hcl Other (See Comments)    Drops blood pressure out  . Nitrospan [Nitroglycerin] Other (See Comments)    Blood pressure dropped out  . Sulfa Antibiotics Other (See Comments)    Urinary burning; blisters   . Sulfasalazine Other (See Comments)    Urinary burning; blisters     Antimicrobials this admission: Vancomycin 1/10 x 1  Cefepime 1/10 >>    Microbiology results: 1/10 BCx: NGTD 1/10 UCx: no growth    Thank  you for allowing pharmacy to be a part of this patient's care.  Tawnya Crook, PharmD 08/17/2020 3:15 PM

## 2020-08-17 NOTE — Progress Notes (Signed)
PRN tylenol given for patient temp 38.6. Delight Ovens, NP aware.

## 2020-08-17 NOTE — Progress Notes (Signed)
[address] [place], [place] [address] Phone [phone]. Fax [phone]  Date: 08/17/2020                  Patient Name:  Nathaniel Thomas  MRN: [id number]  DOB: 12/13/1950  Age / Sex: 70 y.o., male         PCP: Dion Body, MD                  Presenting Illness: Patient is a 70 y.o. male  admitted to [facility] on 08/27/2020 for evaluation of Metabolic acidosis [T02.4] Encounter for central line placement [Z45.2] Acute renal failure (ARF) ([place]) [N17.9] Acute respiratory failure with hypoxia ([place]) [J96.01] Sepsis, due to unspecified organism, unspecified whether acute organ dysfunction present ([place]) [A41.9] COVID-19 [U07.1]   Patient presented to the ER via EMS from home for altered mental status.  Recently treated for COVID.  Per triage notes, patient was prescribed antibiotics for UTI but is unable to take due to lethargy.  Upon arrival patient was responsive to painful stimuli. Also reported to have nausea vomiting and diarrhea for several days prior to admission.  Initial evaluation showed severe acidosis with elevated blood sugars.  Also noted to have hypothermia.  He was intubated for severe acute respiratory failure.  Initially on presentation, pH of 6.99. Patient was treated with ivermectin as outpatient for COVID.  He is unvaccinated.  Hospital course: 08/15/2020:-Patient being monitored in the ICU.  Ventilator dependent.  FiO2 35%.  Urine output has decreased significantly.  Daughter at bedside. 1/13- Vascath placed. Plan for HD today. TF _0  cc/min. Sedated. No pressors 1/14- now on amiodarone, vasopressin. Ketamine, fentanyl, phenylephrine. UOP remains poor.   01/13 0701 - 01/14 0700 In: 2723 [I.V.:1632.8; NG/GT:840; IV Piggyback:250.2] Out: 135 [Urine:135]    Vital Signs: Blood pressure 101/68, pulse 88, temperature (!) 100.58 F (38.1 C), resp. rate (!) 21, height _1  (1.93 m), weight 94.3 kg, SpO2 94 %.   Intake/Output Summary (Last 24 hours) at  08/17/2020 1011 Last data filed at 08/17/2020 0600 Gross per 24 hour  Intake 2723.01 ml  Output 135 ml  Net 2588.01 ml    Weight trends: Filed Weights   08/18/2020 0947 08/15/20 0500 08/17/20 0433  Weight: 90.7 kg 90.9 kg 94.3 kg    Physical Exam: General:  Critically ill-appearing,  HEENT  ET tube in place, eyes rolled upward  Lungs:  Ventilator assisted, FiO2 60%  Heart::  Tachycardic, irregular  Abdomen:  Soft, nontender  Extremities:  Trace edema  Neurologic:  Sedated  Skin:  Warm, dry  Access: Left IJ temp Cath 1/13- ICU team  Foley:  In place       Lab results: Basic Metabolic Panel: Recent Labs  Lab 08/16/20 0416 08/16/20 1339 08/16/20 1513 08/17/20 0439  NA 139  --  142 143  K 4.8  --  4.9 4.3  CL 110  --  109 111  CO2 18*  --  17* 19*  GLUCOSE 243*  --  329* 229*  BUN 99*  --  124* 99*  CREATININE 5.74*  --  6.12* 5.23*  CALCIUM 5.8*  --  5.6* 5.9*  MG 2.0 2.2  --  2.0  PHOS 6.5*  --  7.6* 5.9*    Liver Function Tests: Recent Labs  Lab 08/29/2020 0950 08/16/20 0416 08/17/20 0439  AST 32  --   --   ALT 22  --   --   ALKPHOS 72  --   --  BILITOT 1.9*  --   --   PROT 8.1  --   --   ALBUMIN 3.6   < > 1.8*   < > = values in this interval not displayed.   No results for input(s): LIPASE, AMYLASE in the last 168 hours. No results for input(s): AMMONIA in the last 168 hours.  CBC: Recent Labs  Lab 08/22/2020 0950 09/02/2020 1615 08/16/20 0416 08/17/20 0835  WBC 23.2*   < > 24.9* 22.9*  NEUTROABS 16.1*  --   --   --   HGB 16.4   < > 12.6* 13.9  HCT 53.9*   < > 36.5* 41.0  MCV 97.3   < > 87.1 88.2  PLT 514*   < > 158 188   < > = values in this interval not displayed.    Cardiac Enzymes: No results for input(s): CKTOTAL, TROPONINI in the last 168 hours.  BNP: Invalid input(s): POCBNP  CBG: Recent Labs  Lab 08/16/20 1204 08/16/20 1559 08/16/20 1926 08/17/20 0405 08/17/20 0730  GLUCAP 258* 294* 335* 214* 199*     Microbiology: Recent Results (from the past 720 hour(s))  Blood Culture (routine x 2)     Status: None (Preliminary result)   Collection Time: 08/29/2020  9:50 AM   Specimen: BLOOD  Result Value Ref Range Status   Specimen Description BLOOD LEFT AC  Final   Special Requests   Final    BOTTLES DRAWN AEROBIC AND ANAEROBIC Blood Culture adequate volume   Culture   Final    NO GROWTH 4 DAYS Performed at [facility], [address], [place], [place] [address]    Report Status PENDING  Incomplete  Blood Culture (routine x 2)     Status: None (Preliminary result)   Collection Time: 09/03/2020  9:50 AM   Specimen: BLOOD  Result Value Ref Range Status   Specimen Description BLOOD RIGHT AC  Final   Special Requests   Final    BOTTLES DRAWN AEROBIC AND ANAEROBIC Blood Culture results may not be optimal due to an excessive volume of blood received in culture bottles   Culture   Final    NO GROWTH 4 DAYS Performed at [facility] [address], [place], [place] [address]    Report Status PENDING  Incomplete  Urine culture     Status: None   Collection Time: 08/21/2020 11:36 AM   Specimen: Urine, Random  Result Value Ref Range Status   Specimen Description   Final    URINE, RANDOM Performed at [facility], [address] [place], [place] [address]    Special Requests   Final    NONE Performed at [facility], [address], [place], [place] [address]    Culture   Final    NO GROWTH Performed at [facility], [place] [address], [place], [place] [address]    Report Status 08/14/2020 FINAL  Final     Coagulation Studies: No results for input(s): LABPROT, INR in the last 72 hours.  Urinalysis: No results for input(s): COLORURINE, LABSPEC, PHURINE, GLUCOSEU, HGBUR, BILIRUBINUR, KETONESUR, PROTEINUR, UROBILINOGEN, NITRITE, LEUKOCYTESUR in the last 72 hours.  Invalid input(s): APPERANCEUR      Imaging: US Venous Img Lower Bilateral  (DVT)  Result Date: 08/17/2020 CLINICAL DATA:  Lower extremity edema EXAM: BILATERAL LOWER EXTREMITY VENOUS DOPPLER ULTRASOUND BILATERAL LOWER EXTREMITY VENOUS DOPPLER ULTRASOUND TECHNIQUE: Gray-scale sonography with graded compression, as well as color Doppler and duplex ultrasound were  performed to evaluate the lower extremity deep venous systems from the level of the common femoral vein and including the common femoral, femoral, profunda femoral, popliteal and calf veins including the posterior tibial, peroneal and gastrocnemius veins when visible. The superficial great saphenous vein was also interrogated. Spectral Doppler was utilized to evaluate flow at rest and with distal augmentation maneuvers in the common femoral, femoral and popliteal veins. COMPARISON:  None. FINDINGS: RIGHT LOWER EXTREMITY Common Femoral Vein: No evidence of thrombus. Normal compressibility, respiratory phasicity and response to augmentation. Saphenofemoral Junction: No evidence of thrombus. Normal compressibility and flow on color Doppler imaging. Profunda Femoral Vein: No evidence of thrombus. Normal compressibility and flow on color Doppler imaging. Femoral Vein: No evidence of thrombus. Normal compressibility, respiratory phasicity and response to augmentation. Popliteal Vein: No evidence of thrombus. Normal compressibility, respiratory phasicity and response to augmentation. Calf Veins: No evidence of thrombus. Normal compressibility and flow on color Doppler imaging. Limited visualization of peroneal veins. Superficial Great Saphenous Vein: No evidence of thrombus. Normal compressibility. Other Findings:  None. LEFT LOWER EXTREMITY Common Femoral Vein: No evidence of thrombus. Normal compressibility, respiratory phasicity and response to augmentation. Saphenofemoral Junction: No evidence of thrombus. Normal compressibility and flow on color Doppler imaging. Profunda Femoral Vein: No evidence of thrombus. Normal compressibility  and flow on color Doppler imaging. Femoral Vein: No evidence of thrombus. Normal compressibility, respiratory phasicity and response to augmentation. Popliteal Vein: No evidence of thrombus. Normal compressibility, respiratory phasicity and response to augmentation. Calf Veins: No evidence of thrombus. Normal compressibility and flow on color Doppler imaging.Limited visualization of peroneal veins. Superficial Great Saphenous Vein: No evidence of thrombus. Normal compressibility. Other Findings:  None. IMPRESSION: No evidence of deep venous thrombosis in either lower extremity. Electronically Signed   By: Miachel Roux M.D.   On: 08/17/2020 10:08   DG Chest Port 1 View  Result Date: 08/16/2020 CLINICAL DATA:  Central line placement EXAM: PORTABLE CHEST 1 VIEW COMPARISON:  08/15/2020 FINDINGS: Interval placement of left neck vascular catheter, tip positioned over the superior SVC. Otherwise unchanged AP portable chest radiograph, support apparatus including endotracheal tube and esophagogastric tube, with patchy bilateral heterogeneous airspace opacity. The heart and mediastinum are unremarkable. IMPRESSION: 1. Interval placement of left neck vascular catheter, tip positioned over the superior SVC. 2. Otherwise unchanged AP portable chest radiograph, support apparatus including endotracheal tube and esophagogastric tube, with patchy bilateral heterogeneous airspace opacity. Electronically Signed   By: Eddie Candle M.D.   On: 08/16/2020 10:10   Scheduled Meds: . acetaminophen      . amiodarone  150 mg Intravenous Once  . chlorhexidine gluconate (MEDLINE KIT)  15 mL Mouth Rinse BID  . Chlorhexidine Gluconate Cloth  6 each Topical Daily  . docusate  100 mg Per Tube BID  . feeding supplement (PROSource TF)  90 mL Per Tube TID  . fentaNYL (SUBLIMAZE) injection  25 mcg Intravenous Once  . hydrocortisone sod succinate (SOLU-CORTEF) inj  50 mg Intravenous Q6H  . insulin aspart  0-20 Units Subcutaneous Q4H  .  insulin aspart  6 Units Subcutaneous Q4H  . insulin detemir  53 Units Subcutaneous BID  . mouth rinse  15 mL Mouth Rinse 10 times per day  . polyethylene glycol  17 g Per Tube Daily  . sodium chloride flush  3 mL Intravenous Q12H   Continuous Infusions: . sodium chloride    . amiodarone 60 mg/hr (08/17/20 0600)  . calcium gluconate 1,000 mg (08/17/20 0951)  . ceFEPime (  MAXIPIME) IV Stopped (08/16/20 1831)  . famotidine (PEPCID) IV Stopped (08/16/20 1005)  . feeding supplement (VITAL 1.5 CAL) 45 mL/hr at 08/17/20 0600  . fentaNYL infusion INTRAVENOUS 275 mcg/hr (08/17/20 0600)  . heparin 1,200 Units/hr (08/17/20 3704)  . ketamine (KETALAR) Adult IV Infusion 3 mg/kg/hr (08/17/20 0930)  . lactated ringers 50 mL/hr at 08/17/20 0923  . midazolam    . phenylephrine (NEO-SYNEPHRINE) Adult infusion 100 mcg/min (08/17/20 0600)  . vasopressin     PRN Meds:.sodium chloride, acetaminophen (TYLENOL) oral liquid 160 mg/5 mL, docusate, fentaNYL, heparin, midazolam, ondansetron (ZOFRAN) IV, polyethylene glycol, sodium chloride flush   Assessment & Plan: Pt is a 71 y.o.   male with coronary artery disease, diabetes, glaucoma, history of kidney stones, hyperlipidemia, hypertension, left knee replacement, lithotripsy, basal cell carcinoma, was admitted on 8/88/9169 with Metabolic acidosis [I50.3] Encounter for central line placement [Z45.2] Acute renal failure (ARF) ([place]) [N17.9] Acute respiratory failure with hypoxia ([place]) [J96.01] Sepsis, due to unspecified organism, unspecified whether acute organ dysfunction present ([place]) [A41.9] COVID-19 [U07.1]   #Acute kidney injury Baseline creatinine 1.0 from August 02, 2020 Presenting creatinine of 2.15 Lab Results  Component Value Date   CREATININE 5.23 (H) 08/17/2020   CREATININE 6.12 (H) 08/16/2020   CREATININE 5.74 (H) 08/16/2020     #Acute respiratory failure Requiring ventilator support.  Intubated 08/14/2020  #COVID-19 pneumonia Treated  with ivermectin as outpatient  #Diabetes type 2, insulin dependent Treated with metformin, Jardiance (empagliflozin), Amaryl as outpatient Hemoglobin A1c 7.9% from June 26, 2020 Lab Results  Component Value Date   HGBA1C 7.5 08/10/2017    #Severe acidosis     Plan: Aggressive care as per ICU protocols Replace electrolytes per ICU protocol -Avoid ACE inhibitor/ARB -Avoid nonsteroidals -Avoid IV contrast exposure -Avoid hypotension - consider EEG Tolerated HD on Thursday Next HD planned for Saturday  - currently on LR 50 cc/hr   LOS: 4 [place] 1/14/202210:11 AM    Note: This note was prepared with Dragon dictation. Any transcription errors are unintentional

## 2020-08-17 NOTE — Progress Notes (Signed)
CRITICAL CARE NOTE  70 y.o.malewith PMH as noted below who presents with altered mental status after he was found unresponsive this morning.   Per EMS he was initially hypoxic although this appears to have resolved. The patient himself is unable to give any history.  Patient Dx with COVID 1 week ago +NVD for several days ER shows severe acidosis with elevated sugars Patient with severe hypothermia and intubated for severe resp failure  1/10 admitted to ICU, severe DKA, gastroentertitis 1/11 severe resp failure 1/12 severe DKA 1/13 severe resp failure, worsening Renal failure 1/14- Reviewed care plan with cardiologist Dr Ubaldo Glassing, plan for cardiac cardoversion. Patient remains in shock with AFRVR on MV, medically optimizing for procedure.  I met with daughter York Cerise.    CC  follow up respiratory failure  SUBJECTIVE Patient remains critically ill Prognosis is guarded   BP 101/68   Pulse 88   Temp (!) 100.58 F (38.1 C)   Resp (!) 21   Ht _0  (1.93 m)   Wt 94.3 kg   SpO2 94%   BMI 25.31 kg/m    I/O last 3 completed shifts: In: 3355.7 [I.V.:2071.7; NG/GT:1033.8; IV Piggyback:250.2] Out: 215 [Urine:215] No intake/output data recorded.  SpO2: 94 % FiO2 (%): 60 %  Estimated body mass index is 25.31 kg/m as calculated from the following:   Height as of this encounter: _1  (1.93 m).   Weight as of this encounter: 94.3 kg.  SIGNIFICANT EVENTS   REVIEW OF SYSTEMS  PATIENT IS UNABLE TO PROVIDE COMPLETE REVIEW OF SYSTEMS DUE TO SEVERE CRITICAL ILLNESS   Pressure Injury 08/15/20 Sacrum Medial;Lower Deep Tissue Pressure Injury - Purple or maroon localized area of discolored intact skin or blood-filled blister due to damage of underlying soft tissue from pressure and/or shear. bruised looking non-blanchabl (Active)  08/15/20 1200  Location: Sacrum  Location Orientation: Medial;Lower  Staging: Deep Tissue Pressure Injury - Purple or maroon localized area of  discolored intact skin or blood-filled blister due to damage of underlying soft tissue from pressure and/or shear.  Wound Description (Comments): bruised looking non-blanchable purple on soft tissue, Serous blisters bilaterally  Present on Admission:     PHYSICAL EXAMINATION: GENERAL:critically ill appearing, on MV NECK: Supple.  PULMONARY: +rhonchi, MV sounds CARDIOVASCULAR: S1 and S2.  GASTROINTESTINAL: Soft, nontender, +Positive bowel sounds.  MUSCULOSKELETAL: No swelling, clubbing, or edema.  NEUROLOGIC: obtunded, GCS4T SKIN:+sacral decub    MEDICATIONS: I have reviewed all medications and confirmed regimen as documented   CULTURE RESULTS   Recent Results (from the past 240 hour(s))  Blood Culture (routine x 2)     Status: None (Preliminary result)   Collection Time: 08/06/2020  9:50 AM   Specimen: BLOOD  Result Value Ref Range Status   Specimen Description BLOOD LEFT AC  Final   Special Requests   Final    BOTTLES DRAWN AEROBIC AND ANAEROBIC Blood Culture adequate volume   Culture   Final    NO GROWTH 4 DAYS Performed at [facility] [address] [place], [place] [address]    Report Status PENDING  Incomplete  Blood Culture (routine x 2)     Status: None (Preliminary result)   Collection Time: 08/09/2020  9:50 AM   Specimen: BLOOD  Result Value Ref Range Status   Specimen Description BLOOD RIGHT AC  Final   Special Requests   Final    BOTTLES DRAWN AEROBIC AND ANAEROBIC Blood Culture results may not be optimal due to an excessive volume  of blood received in culture bottles   Culture   Final    NO GROWTH 4 DAYS Performed at [facility], [address] [place], [place] [address]    Report Status PENDING  Incomplete  Urine culture     Status: None   Collection Time: 09/01/2020 11:36 AM   Specimen: Urine, Random  Result Value Ref Range Status   Specimen Description   Final    URINE, RANDOM Performed at [facility], [address], [place], [place] [address]    Special Requests   Final    NONE Performed at [facility], [address] [place], [place] [address]    Culture   Final    NO GROWTH Performed at [facility], [place] [address], [place], [place] [address]    Report Status 08/14/2020 FINAL  Final          IMAGING    DG Chest Port 1 View  Result Date: 08/16/2020 CLINICAL DATA:  Central line placement EXAM: PORTABLE CHEST 1 VIEW COMPARISON:  08/15/2020 FINDINGS: Interval placement of left neck vascular catheter, tip positioned over the superior SVC. Otherwise unchanged AP portable chest radiograph, support apparatus including endotracheal tube and esophagogastric tube, with patchy bilateral heterogeneous airspace opacity. The heart and mediastinum are unremarkable. IMPRESSION: 1. Interval placement of left neck vascular catheter, tip positioned over the superior SVC. 2. Otherwise unchanged AP portable chest radiograph, support apparatus including endotracheal tube and esophagogastric tube, with patchy bilateral heterogeneous airspace opacity. Electronically Signed   By: Eddie Candle M.D.   On: 08/16/2020 10:10     Nutrition Status: Nutrition Problem: Moderate Malnutrition Etiology: chronic illness (CKD) Signs/Symptoms: mild fat depletion,mild muscle depletion Interventions: Tube feeding     Indwelling Urinary Catheter continued, requirement due to   Reason to continue Indwelling Urinary Catheter strict Intake/Output monitoring for hemodynamic instability         Ventilator continued, requirement due to severe respiratory failure   Ventilator Sedation RASS 0 to -2             82.8 cm/sec peak vel    2.7 mmHg peak grad  _________________________________________________________________________________________  INTERPRETATION  NORMAL LEFT VENTRICULAR SYSTOLIC FUNCTION  WITH MILD LVH  NORMAL RIGHT VENTRICULAR SYSTOLIC FUNCTION  MILD VALVULAR REGURGITATION (See above)   NO VALVULAR STENOSIS  MILD TR, PR  TRIVIAL MR  EF 45-50%  _________________________________________________________________________________________  Electronically signed by  Lujean Amel, MD on 01/25/2018 07: 58 AM      Performed By: Maurilio Lovely, RDCS  Ordering Physician: Lujean Amel  _________________________________________________________________________________________  Other Result Text  Interface, Text Results In - 01/25/2018 7:58 AM EDT            CARDIOLOGY DEPARTMENT          B[place], [place]       [facility]                  [id number]       [facility]             Acct #: [id number]       [address] [place], [place] [address]    Date: 01/20/2018 10: 04 AM                                Adult  Male  Age: 27 yrs       ECHOCARDIOGRAM REPORT  Outpatient                                [facility]    STUDY:CHEST WALL        TAPE:          MD1: CALLWOOD, DWAYNE DENNIS     ECHO:Yes  DOPPLER:Yes    FILE:          BP: 122/72 mmHg    COLOR:Yes  CONTRAST:No   MACHINE:Philips  RV BIOPSY:No     3D:No SOUND QLTY:Moderate      Height: 74 in    MEDIUM:None                       Weight: 208 lb                               BSA: 2.2 m2  _________________________________________________________________________________________         HISTORY: DOE         REASON: Assess, LV function       INDICATION: CAD in native artery [I25.10 (ICD-10-CM)]  _________________________________________________________________________________________  ECHOCARDIOGRAPHIC MEASUREMENTS  2D DIMENSIONS  AORTA         Values  Normal Range  MAIN PA     Values  Normal  Range         Annulus: 2.2 cm    [2.3-2.9]     PA Main: nm*    [1.5-2.1]        Aorta Sin: 3.5 cm    [3.1-3.7]  RIGHT VENTRICLE       ST Junction: nm*     [2.6-3.2]     RV Base: nm*    [<4.2]        Asc.Aorta: nm*     [2.6-3.4]     RV Mid: nm*    [<3.5]  LEFT VENTRICLE                   RV Length: 3.5 cm  [<8.6]          LVIDd: 4.8 cm    [4.2-5.9]  INFERIOR VENA CAVA          LVIDs: 3.5 cm            Max. IVC: nm*   [<=2.1]           FS: 26.6 %    [>25]      Min. IVC: nm*           SWT: 1.3 cm    [0.6-1.0]  ------------------           PWT: 1.2 cm    [0.6-1.0]  nm* - not measured  LEFT ATRIUM         LA Diam: 4.0 cm    [3.0-4.0]       LA A4C Area: nm*     [<20]        LA Volume: nm*     [18-58]  _________________________________________________________________________________________  ECHOCARDIOGRAPHIC DESCRIPTIONS  AORTIC ROOT         Size: Normal       Dissection: INDETERM FOR DISSECTION  AORTIC VALVE        Leaflets: Tricuspid          Morphology: Normal        Mobility: Fully mobile  LEFT VENTRICLE  Size: Normal            Anterior: Normal       Contraction: Normal             Lateral: Normal       Closest EF: 50% (Estimated)         Septal: Normal        LV Masses: No Masses            Apical: Normal          LVH: MILD LVH           Inferior: Normal                            Posterior: Normal      Dias.FxClass: N/A  MITRAL VALVE        Leaflets: Normal            Mobility: Fully mobile      Morphology: Normal  LEFT ATRIUM          Size: Normal            LA Masses: No  masses        IA Septum: Normal IAS  MAIN PA          Size: Normal  PULMONIC VALVE       Morphology: Normal            Mobility: Fully mobile  RIGHT VENTRICLE        RV Masses: No Masses             Size: Normal        Free Wall: Normal           Contraction: Normal  TRICUSPID VALVE        Leaflets: Normal           Mobility: Fully mobile       Morphology: Normal  RIGHT ATRIUM          Size: Normal            RA Other: None         RA Mass: No masses  PERICARDIUM          Fluid: No effusion  INFERIOR VENACAVA          Size: Normal Normal respiratory collapse  _________________________________________________________________________________________   DOPPLER ECHO and OTHER SPECIAL PROCEDURES         Aortic: No AR           No AS             101.8 cm/sec peak vel   4.1 mmHg peak grad         Mitral: TRIVIAL MR         No MS             3.6 cm^2 by DOPPLER             MV Inflow E Vel = 39.8 cm/sec   MV Annulus E'Vel = 4.9 cm/sec            E/E'Ratio = 8.1        Tricuspid: MILD TR          No TS             224.0 cm/sec peak TR vel  25.1 mmHg peak RV pressure        Pulmonary: MILD PR  No PS             82.8 cm/sec peak vel    2.7 mmHg peak grad  _________________________________________________________________________________________  INTERPRETATION  NORMAL LEFT VENTRICULAR SYSTOLIC FUNCTION  WITH MILD LVH  NORMAL RIGHT VENTRICULAR SYSTOLIC FUNCTION  MILD VALVULAR REGURGITATION (See above)  NO VALVULAR STENOSIS  MILD TR, PR  TRIVIAL MR  EF 45-50%  _________________________________________________________________________________________  Electronically signed by  Lujean Amel, MD on  01/25/2018 07: 67 AM      Performed By: Maurilio Lovely, RDCS   Ordering Physician: Lujean Amel  _________________________________________________________________________________________ Specimen Collected: - Last Resulted: -  Date: 01/25/18   Received From: [facility]  Severe ACUTE Hypoxic and Hypercapnic Respiratory Failure due to severe metabolic acidosis due to severe DKA due to severe viral gastroenteritis due to COVID 19 infection complicated by acute renal failure Dx of DKA with diabetic coma  Acute hypoxemic respiratory failure due to COVID-19 pneumonia / ARDS Mechanical ventilation via ARDS protocol, target PRVC 6 cc/kg -Remdesevir antiviral - pharmacy protocol 5 d -vitamin C -zinc -decadron 72m IV daily  -Diuresis when out of shock physiology - monitor UOP - utilize external urinary catheter if possible - prone if patient can tolerate  -d/c hepatotoxic medications while on remdesevir -supportive care with ICU telemetry monitoring -PT/OT when possible -procalcitonin, CRP and ferritin trending    Atrial fibrillation with RVR  - cardiology on case - discussed with Dr FUbaldo Glassingtoday   - currently on amiodarone gtt - plan for cardioversion   -1x dose digoxin 1243m only due to esrd  - have dcd metoprolol while patient is in circulatory shock on vasopressors     ACUTE KIDNEY INJURY/Renal Failure -continue Foley Catheter-assess need -Avoid nephrotoxic agents -Follow urine output, BMP -Ensure adequate renal perfusion, optimize oxygenation -Renal dose medications  - vasc cath placement and HD today     NEUROLOGY Acute toxic metabolic encephalopathy, need for sedation Goal RASS -2 to -3  Circulatory shock Etiology - possible distributive due to Sepsis pneumonia - present on admission due to COVID19 vs obstructive due to acute PE vs cadiogenic in 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 -will optimize medically by decreasing arythmogenic potential - will minimize neosynephrine and swtich to vasopressin  - adding ketamine for sedation - note most recent TTE in 2019 with EF 50% and adequate parameters in diastology with minimal vavular disease -cardiology on case - plan to cardiovert today - continue with amiodarone -use vasopressors to keep MAP>65 -follow ABG and LA -follow up cultures -emperic ABX -CXR with clear costophrenic angles - will deliver IVF LR x 1 L while in shock   ID -continue IV abx as prescibed -follow up cultures  GI GI PROPHYLAXIS as indicated   DIET-->TF's as tolerated Constipation protocol as indicated  ENDO - will use ICU hypoglycemic\Hyperglycemia protocol if indicated     ELECTROLYTES -follow labs as needed -replace as needed -pharmacy consultation and following   DVT/GI PRX ordered and assessed TRANSFUSIONS AS NEEDED MONITOR FSBS I Assessed the need for Labs I Assessed the need for Foley I Assessed the need for Central Venous Line Family Discussion when available I Assessed the need for Mobilization I made an Assessment of medications to be adjusted accordingly Safety Risk assessment completed   CASE DISCUSSED IN MULTIDISCIPLINARY ROUNDS WITH ICU TEAM  Critical Care Time devoted to patient care services described in this note is 32 minutes.  Overall, patient is critically ill, prognosis is guarded.  Patient with Multiorgan failure and at high risk for cardiac arrest and death.     Ottie Glazier, M.D.  [facility] [place]

## 2020-08-17 NOTE — Progress Notes (Signed)
Inpatient Diabetes Program Recommendations  AACE/ADA: New Consensus Statement on Inpatient Glycemic Control   Target Ranges:  Prepandial:   less than 140 mg/dL      Peak postprandial:   less than 180 mg/dL (1-2 hours)      Critically ill patients:  140 - 180 mg/dL  Results for Nathaniel Thomas, Nathaniel Thomas (MRN [id number]) as of 08/17/2020 07:16  Ref. Range 08/16/2020 07:44 08/16/2020 12:04 08/16/2020 15:59 08/16/2020 19:26 08/17/2020 04:05  Glucose-Capillary Latest Ref Range: 70 - 99 mg/dL 197 (H) 258 (H) 294 (H) 335 (H) 214 (H)    Review of Glycemic Control  Diabetes history:DM2 Outpatient Diabetes medications:Levemir 25 units BID, Amaryl 4 mg daily, Jardiance 25 mg daily, Metformin 1000 mg BID Current orders for Inpatient glycemic control: Levemir 53 units Q12H, Novolog 0-20 units Q4H, Novolog 6 units Q4H for tube feeding; Solumedrol 80 mg Q12H, Vital @ 45 ml/hr   Inpatient Diabetes Program Recommendations:    Insulin: If steroids are continued as ordered, please consider increasing Levemir to 58 units BID and increasing tube feeding coverage to Novolog 8 units Q4H.  Thanks, Barnie Alderman, RN, MSN, CDE Diabetes Coordinator Inpatient Diabetes Program [phone] (Team Pager from 8am to 5pm)

## 2020-08-17 NOTE — Progress Notes (Signed)
[place] for heparin Indication: atrial fibrillation  Allergies  Allergen Reactions  . Demerol Other (See Comments)    Blood pressure dropped completely out  . Meperidine Other (See Comments)    Drops blood pressure out  Blood pressure dropped completely out  . Meperidine Hcl Other (See Comments)    Drops blood pressure out  . Nitrospan [Nitroglycerin] Other (See Comments)    Blood pressure dropped out  . Sulfa Antibiotics Other (See Comments)    Urinary burning; blisters   . Sulfasalazine Other (See Comments)    Urinary burning; blisters     Patient Measurements: Height: 6\' 4"  (193 cm) Weight: 94.3 kg (207 lb 14.3 oz) IBW/kg (Calculated) : 86.8 Heparin Dosing Weight: 90 kg  Vital Signs: Temp: 100.76 F (38.2 C) (01/14 1200) BP: 78/59 (01/14 1200) Pulse Rate: 107 (01/14 1200)  Labs: Recent Labs    08/15/20 0458 08/15/20 0831 08/16/20 0416 08/16/20 1339 08/16/20 1513 08/16/20 2144 08/17/20 0439 08/17/20 0835  HGB 13.0  --  12.6*  --   --   --   --  13.9  HCT 36.9*  --  36.5*  --   --   --   --  41.0  PLT 208  --  158  --   --   --   --  188  HEPARINUNFRC  --   --   --   --   --  0.73*  --  0.54  CREATININE  --    < > 5.74*  --  6.12*  --  5.23*  --   CKMB  --   --   --  28.3*  --   --   --   --   TROPONINIHS  --   --   --  1,874* 1,755*  --   --   --    < > = values in this interval not displayed.    Estimated Creatinine Clearance: 16.1 mL/min (A) (by C-G formula based on SCr of 5.23 mg/dL (H)).   Medical History: Past Medical History:  Diagnosis Date  . CAD (coronary artery disease)   . Cataract   . CHICKENPOX, HX OF 08/30/2008   Qualifier: Diagnosis of  By: [name], [name]    . Chronic kidney disease   . Diabetes mellitus   . Glaucoma   . History of urinary calculi 08/30/2008   Qualifier: Diagnosis of  By: [name], Jacelyn Pi   . Hyperlipidemia   . Hypertension   . Renal calculus or stone   . Skin cancer of  nose    ears and hands   Assessment: 70 year old male admitted with severe DKA, which has since resolved and patient now on SQ insulin regimen. Patient with worsening renal function, plan to start HD 1/13. Patient with new afib 1/13, started on amiodarone and heparin.  Goal of Therapy:  Heparin level 0.3-0.7 units/ml Monitor platelets by anticoagulation protocol: Yes   Plan:  1/14 0835 HL 0.54 therapeutic x 1. Continue heparin drip at 1200 units/hr. Recheck HL at 1700 to confirm. CBC daily while on heparin drip.   Tawnya Crook, PharmD 08/17/2020,3:11 PM

## 2020-08-17 NOTE — Progress Notes (Signed)
ANTICOAGULATION CONSULT NOTE  Pharmacy Consult for heparin Indication: atrial fibrillation  Patient Measurements: Heparin Dosing Weight: 90 kg  Labs: Recent Labs    08/15/20 0458 08/15/20 0831 08/16/20 0416 08/16/20 1339 08/16/20 1513 08/16/20 2144 08/17/20 0439 08/17/20 0835 08/17/20 1729  HGB 13.0  --  12.6*  --   --   --   --  13.9  --   HCT 36.9*  --  36.5*  --   --   --   --  41.0  --   PLT 208  --  158  --   --   --   --  188  --   HEPARINUNFRC  --   --   --   --   --  0.73*  --  0.54 0.56  CREATININE  --    < > 5.74*  --  6.12*  --  5.23*  --   --   CKMB  --   --   --  28.3*  --   --   --   --   --   TROPONINIHS  --   --   --  1,874* 1,755*  --   --   --   --    < > = values in this interval not displayed.    Estimated Creatinine Clearance: 16.1 mL/min (A) (by C-G formula based on SCr of 5.23 mg/dL (H)).   Medical History: Past Medical History:  Diagnosis Date  . CAD (coronary artery disease)   . Cataract   . CHICKENPOX, HX OF 08/30/2008   Qualifier: Diagnosis of  By: [name], [name]    . Chronic kidney disease   . Diabetes mellitus   . Glaucoma   . History of urinary calculi 08/30/2008   Qualifier: Diagnosis of  By: [name], Jacelyn Pi   . Hyperlipidemia   . Hypertension   . Renal calculus or stone   . Skin cancer of nose    ears and hands   Assessment: 70 year old male admitted with severe DKA, which has since resolved and patient now on SQ insulin regimen. Patient with worsening renal function, plan to start HD 1/13. Patient with new afib 1/13, started on amiodarone and heparin.  Goal of Therapy:  Heparin level 0.3-0.7 units/ml Monitor platelets by anticoagulation protocol: Yes   Plan:  --1/14 at 1729 HL = 0.56, therapeutic x 2. Will continue heparin infusion at current rate of 1200 units/hr --Re-check HL tomorrow AM --Daily CBC per protocol while on heparin infusion  Benita Gutter 08/17/2020,6:00 PM

## 2020-08-17 NOTE — Progress Notes (Signed)
Patient Name: Nathaniel Thomas Date of Encounter: 08/17/2020  Hospital Problem List     Active Problems:   COVID-19   Malnutrition of moderate degree    Patient Profile     70 y.o. male with history of diabetes, hypertension and hyperlipidemia who was admitted on January 10 of this year after altered mental status and was found to be unresponsive.  He had been diagnosed with COVID 1 week prior.  He had nausea vomiting and diarrhea for several days and was unable to take in medications.  He was noted to be severely acidotic with elevated sugars is consistent with DKA.  He had severe hypothermia and was intubated for severe respiratory failure.  He has a history of being evaluated with a coronary artery CT in 2012 showing noncritical disease.  He developed atrial fibrillation rapid ventricular response today.  Throughout his hospital stay he has been noted to have gradually worsening renal function.  His serum troponins were normal on admission.  He had acute renal injury with a creatinine of 2.01 up from normal creatinine as a base line. Echo in 2019 showed ef of 50%. Has develped worsening renal funciton wit creatine of 6.12. Afib rate is still rapid with rates between 130-150. Pt is intubated and sedated.   Subjective   Intubated and sedated.  Remains with A. fib with RVR.  Remains on pressors.  Inpatient Medications    . acetaminophen      . amiodarone  150 mg Intravenous Once  . chlorhexidine gluconate (MEDLINE KIT)  15 mL Mouth Rinse BID  . Chlorhexidine Gluconate Cloth  6 each Topical Daily  . docusate  100 mg Per Tube BID  . feeding supplement (PROSource TF)  90 mL Per Tube TID  . fentaNYL (SUBLIMAZE) injection  25 mcg Intravenous Once  . hydrocortisone sod succinate (SOLU-CORTEF) inj  50 mg Intravenous Q6H  . insulin aspart  0-20 Units Subcutaneous Q4H  . insulin aspart  6 Units Subcutaneous Q4H  . insulin detemir  53 Units Subcutaneous BID  . mouth rinse  15 mL Mouth Rinse 10  times per day  . polyethylene glycol  17 g Per Tube Daily  . sodium chloride flush  3 mL Intravenous Q12H    Vital Signs    Vitals:   08/17/20 0615 08/17/20 0630 08/17/20 0645 08/17/20 0700  BP: 119/74 94/68 95/66  101/68  Pulse: (!) 133 66 65 88  Resp: 20 20 (!) 21 (!) 21  Temp: (!) 100.76 F (38.2 C) (!) 100.58 F (38.1 C) (!) 100.58 F (38.1 C) (!) 100.58 F (38.1 C)  TempSrc:      SpO2: 96% 95% 95% 94%  Weight:      Height:        Intake/Output Summary (Last 24 hours) at 08/17/2020 1221 Last data filed at 08/17/2020 0600 Gross per 24 hour  Intake 2723.01 ml  Output 135 ml  Net 2588.01 ml   Filed Weights   08/09/2020 0947 08/15/20 0500 08/17/20 0433  Weight: 90.7 kg 90.9 kg 94.3 kg    Physical Exam    GEN: Acutely ill male HEENT: normal.  Neck: Supple, no JVD, carotid bruits, or masses. Cardiac: Tachycardia Respiratory:  Respirations regular and unlabored, clear to auscultation bilaterally. GI: Soft, nontender, nondistended, BS + x 4. MS: no deformity or atrophy. Skin: warm and dry, no rash. Neuro: Intubated and sedated  Labs    CBC Recent Labs    08/16/20 0416 08/17/20 0835  WBC 24.9*  22.9*  HGB 12.6* 13.9  HCT 36.5* 41.0  MCV 87.1 88.2  PLT 158 591   Basic Metabolic Panel Recent Labs    08/16/20 1339 08/16/20 1513 08/17/20 0439  NA  --  142 143  K  --  4.9 4.3  CL  --  109 111  CO2  --  17* 19*  GLUCOSE  --  329* 229*  BUN  --  124* 99*  CREATININE  --  6.12* 5.23*  CALCIUM  --  5.6* 5.9*  MG 2.2  --  2.0  PHOS  --  7.6* 5.9*   Liver Function Tests Recent Labs    08/16/20 1513 08/17/20 0439  ALBUMIN 2.1* 1.8*   No results for input(s): LIPASE, AMYLASE in the last 72 hours. Cardiac Enzymes Recent Labs    08/16/20 1339  CKMB 28.3*   BNP No results for input(s): BNP in the last 72 hours. D-Dimer Recent Labs    08/15/20 0458  DDIMER >20.00*   Hemoglobin A1C No results for input(s): HGBA1C in the last 72 hours. Fasting  Lipid Panel Recent Labs    08/15/20 0458  TRIG 310*   Thyroid Function Tests Recent Labs    08/17/20 0439  TSH 0.064*    Telemetry    A. fib with rapid ventricular response  ECG    Sinus rhythm with no ischemia initially.  Currently A. fib with RVR  Radiology    DG Abdomen 1 View  Result Date: 08/27/2020 CLINICAL DATA:  Nasogastric tube placement. EXAM: ABDOMEN - 1 VIEW COMPARISON:  Abdominal radiographs 10/09/2016. Abdominal CT 06/15/2018. FINDINGS: 1353 hours. Enteric tube projects below the diaphragm, with tip overlying the gastric fundal region. Side hole is near the gastroesophageal junction. The visualized bowel gas pattern is normal. There are degenerative changes within the thoracolumbar spine. IMPRESSION: Enteric tube projects to the level of the proximal stomach. Electronically Signed   By: Richardean Sale M.D.   On: 08/31/2020 14:07   CT Head Wo Contrast  Result Date: 08/11/2020 CLINICAL DATA:  Change in mental status EXAM: CT HEAD WITHOUT CONTRAST TECHNIQUE: Contiguous axial images were obtained from the base of the skull through the vertex without intravenous contrast. COMPARISON:  None. FINDINGS: Brain: No evidence of acute infarction, hemorrhage, hydrocephalus, extra-axial collection or mass lesion/mass effect. Vascular: No hyperdense vessel or unexpected calcification. Skull: Normal. Negative for fracture or focal lesion. Sinuses/Orbits: No acute finding.  Left cataract resection. Other: Pervasive motion artifact to a moderate degree. IMPRESSION: Negative motion degraded head CT. Electronically Signed   By: Monte Fantasia M.D.   On: 08/31/2020 11:11   US RENAL  Result Date: 08/14/2020 CLINICAL DATA:  Acute renal failure EXAM: RENAL / URINARY TRACT ULTRASOUND COMPLETE COMPARISON:  Ultrasound kidneys 04/13/2019 MRI abdomen 07/06/2020 FINDINGS: Right Kidney: Renal measurements: 11.0 x 6.9 x 6.1 = volume: 243 mL. 2.3 cm hypoechoic structure in the anterior right kidney  likely a simple cyst given appearance on recent MRI. Echogenicity within normal limits. No suspicious mass or hydronephrosis visualized. Left Kidney: Renal measurements: 11.5 x 6.7 x 1.5 = volume: 302 mL. Echogenicity within normal limits. No mass or hydronephrosis visualized. 12 mm linear echogenicity in the lower pole suspicious for nonobstructing calculus. Bladder: Unable to evaluate as it is collapsed around a Foley catheter balloon. Other: None. IMPRESSION: 1. No hydronephrosis. 2. Nonobstructing left renal calculus. Electronically Signed   By: Miachel Roux M.D.   On: 08/14/2020 08:16   US Venous Img Lower Bilateral (DVT)  Result Date: 08/17/2020 CLINICAL DATA:  Lower extremity edema EXAM: BILATERAL LOWER EXTREMITY VENOUS DOPPLER ULTRASOUND BILATERAL LOWER EXTREMITY VENOUS DOPPLER ULTRASOUND TECHNIQUE: Gray-scale sonography with graded compression, as well as color Doppler and duplex ultrasound were performed to evaluate the lower extremity deep venous systems from the level of the common femoral vein and including the common femoral, femoral, profunda femoral, popliteal and calf veins including the posterior tibial, peroneal and gastrocnemius veins when visible. The superficial great saphenous vein was also interrogated. Spectral Doppler was utilized to evaluate flow at rest and with distal augmentation maneuvers in the common femoral, femoral and popliteal veins. COMPARISON:  None. FINDINGS: RIGHT LOWER EXTREMITY Common Femoral Vein: No evidence of thrombus. Normal compressibility, respiratory phasicity and response to augmentation. Saphenofemoral Junction: No evidence of thrombus. Normal compressibility and flow on color Doppler imaging. Profunda Femoral Vein: No evidence of thrombus. Normal compressibility and flow on color Doppler imaging. Femoral Vein: No evidence of thrombus. Normal compressibility, respiratory phasicity and response to augmentation. Popliteal Vein: No evidence of thrombus. Normal  compressibility, respiratory phasicity and response to augmentation. Calf Veins: No evidence of thrombus. Normal compressibility and flow on color Doppler imaging. Limited visualization of peroneal veins. Superficial Great Saphenous Vein: No evidence of thrombus. Normal compressibility. Other Findings:  None. LEFT LOWER EXTREMITY Common Femoral Vein: No evidence of thrombus. Normal compressibility, respiratory phasicity and response to augmentation. Saphenofemoral Junction: No evidence of thrombus. Normal compressibility and flow on color Doppler imaging. Profunda Femoral Vein: No evidence of thrombus. Normal compressibility and flow on color Doppler imaging. Femoral Vein: No evidence of thrombus. Normal compressibility, respiratory phasicity and response to augmentation. Popliteal Vein: No evidence of thrombus. Normal compressibility, respiratory phasicity and response to augmentation. Calf Veins: No evidence of thrombus. Normal compressibility and flow on color Doppler imaging.Limited visualization of peroneal veins. Superficial Great Saphenous Vein: No evidence of thrombus. Normal compressibility. Other Findings:  None. IMPRESSION: No evidence of deep venous thrombosis in either lower extremity. Electronically Signed   By: Miachel Roux M.D.   On: 08/17/2020 10:08   DG Chest Port 1 View  Result Date: 08/16/2020 CLINICAL DATA:  Central line placement EXAM: PORTABLE CHEST 1 VIEW COMPARISON:  08/15/2020 FINDINGS: Interval placement of left neck vascular catheter, tip positioned over the superior SVC. Otherwise unchanged AP portable chest radiograph, support apparatus including endotracheal tube and esophagogastric tube, with patchy bilateral heterogeneous airspace opacity. The heart and mediastinum are unremarkable. IMPRESSION: 1. Interval placement of left neck vascular catheter, tip positioned over the superior SVC. 2. Otherwise unchanged AP portable chest radiograph, support apparatus including endotracheal  tube and esophagogastric tube, with patchy bilateral heterogeneous airspace opacity. Electronically Signed   By: Eddie Candle M.D.   On: 08/16/2020 10:10   DG Chest Port 1 View  Result Date: 08/15/2020 CLINICAL DATA:  Acute respiratory failure with hypoxia EXAM: PORTABLE CHEST 1 VIEW COMPARISON:  08/14/2020 FINDINGS: Endotracheal tube and NG tube are unchanged. Bibasilar atelectasis or infiltrates. No effusions. Heart is normal size. No acute bony abnormality. IMPRESSION: Bibasilar atelectasis or infiltrates. Electronically Signed   By: Rolm Baptise M.D.   On: 08/15/2020 03:01   DG Chest Port 1 View  Result Date: 08/14/2020 CLINICAL DATA:  Endotracheal tube placement EXAM: PORTABLE CHEST 1 VIEW COMPARISON:  August 13, 2020 FINDINGS: The endotracheal tube terminates approximately 6 cm above the carina. The enteric tube extends below the left hemidiaphragm. There is no definite pneumothorax or large pleural effusion. There are increasing airspace opacities at the left lung base  and in the retrocardiac region. The heart size is stable. Aortic calcifications are noted. IMPRESSION: 1. Lines and tubes as above. 2. Increasing airspace opacities at the left lung base and within the retrocardiac region suspicious for atelectasis or developing pneumonia. Electronically Signed   By: Constance Holster M.D.   On: 08/14/2020 00:24   DG Chest Port 1 View  Result Date: 08/26/2020 CLINICAL DATA:  Attempted central line placement. Assess for pneumothorax EXAM: PORTABLE CHEST 1 VIEW COMPARISON:  08/29/2020 FINDINGS: Endotracheal tube is 7.6 cm above the carina. NG tube is in the stomach. No visible pneumothorax. Heart is normal size. Bibasilar airspace opacities. No effusions. IMPRESSION: No visible pneumothorax. Bibasilar atelectasis or infiltrates. Electronically Signed   By: Rolm Baptise M.D.   On: 08/05/2020 21:51   DG Chest Portable 1 View  Result Date: 08/09/2020 CLINICAL DATA:  Hypoxia.  Reported nasogastric  tube placement EXAM: PORTABLE CHEST 1 VIEW COMPARISON:  August 13, 2020 study obtained earlier in the day FINDINGS: A nasogastric tube is not appreciable on current examination. Endotracheal tube tip is 6.8 cm above the carina. No pneumothorax. Lungs are clear. Heart size and pulmonary vascularity are normal. No adenopathy. There is degenerative change in the thoracic spine. IMPRESSION: Endotracheal tube as described without pneumothorax. No nasogastric tube apparent. Lungs clear. Cardiac silhouette normal. Electronically Signed   By: Lowella Grip III M.D.   On: 08/22/2020 12:59   DG Chest Portable 1 View  Result Date: 08/14/2020 CLINICAL DATA:  Chest pain shortness of breath. Recent COVID-19 diagnosis. EXAM: PORTABLE CHEST 1 VIEW COMPARISON:  03/03/2011 FINDINGS: Midline trachea. Borderline cardiomegaly. No pleural effusion or pneumothorax. Suspect mild left base scarring. Clear right lung. IMPRESSION: Borderline cardiomegaly, without acute disease. Electronically Signed   By: Abigail Miyamoto M.D.   On: 08/11/2020 10:24    Assessment & Plan    Atrial fibrillation-patient developed A. fib with RVR.  Etiology is likely multifactorial to include COVID-pneumonia, recent DKA today, acute renal injury.  Difficult to control rate with amiodarone.  Noted to have reduced TSH at 0.064.  T4 T3 pending.  Currently on heparin and amiodarone drip.  Has received 3 boluses of 150 mg.  Received a 0.130 mg bolus of digoxin.  We will continue with amiodarone drip and heparin for now with attempts at controlling rate with digoxin.  May need to consider cardioversion attempt however maintaining sinus rhythm given comorbidities he is likely not to be long-term.  Respiratory failure-remains intubated.  Plans per ICU team  Acute kidney injury-creatinine increased to 6.12 yesterday down to 5.23 today.  Baseline appears to have been normal.  Appreciate nephrology input.  Signed, Javier Docker Lanise Mergen MD 08/17/2020, 12:21 PM   Pager: [phone]

## 2020-08-18 ENCOUNTER — Inpatient Hospital Stay: Payer: HMO

## 2020-08-18 LAB — CULTURE, BLOOD (ROUTINE X 2)
Culture: NO GROWTH
Culture: NO GROWTH
Special Requests: ADEQUATE

## 2020-08-18 LAB — RENAL FUNCTION PANEL
Albumin: 1.7 g/dL — ABNORMAL LOW (ref 3.5–5.0)
Anion gap: 16 — ABNORMAL HIGH (ref 5–15)
BUN: 118 mg/dL — ABNORMAL HIGH (ref 8–23)
CO2: 16 mmol/L — ABNORMAL LOW (ref 22–32)
Calcium: 6.1 mg/dL — CL (ref 8.9–10.3)
Chloride: 108 mmol/L (ref 98–111)
Creatinine, Ser: 6.13 mg/dL — ABNORMAL HIGH (ref 0.61–1.24)
GFR, Estimated: 9 mL/min — ABNORMAL LOW (ref >60)
Glucose, Bld: 210 mg/dL — ABNORMAL HIGH (ref 70–99)
Phosphorus: 6.6 mg/dL — ABNORMAL HIGH (ref 2.5–4.6)
Potassium: 4.7 mmol/L (ref 3.5–5.1)
Sodium: 140 mmol/L (ref 135–145)

## 2020-08-18 LAB — MAGNESIUM: Magnesium: 2.2 mg/dL (ref 1.7–2.4)

## 2020-08-18 LAB — CBC
HCT: 33.5 % — ABNORMAL LOW (ref 39.0–52.0)
Hemoglobin: 11.6 g/dL — ABNORMAL LOW (ref 13.0–17.0)
MCH: 30.1 pg (ref 26.0–34.0)
MCHC: 34.6 g/dL (ref 30.0–36.0)
MCV: 86.8 fL (ref 80.0–100.0)
Platelets: 123 10*3/uL — ABNORMAL LOW (ref 150–400)
RBC: 3.86 MIL/uL — ABNORMAL LOW (ref 4.22–5.81)
RDW: 15.4 % (ref 11.5–15.5)
WBC: 16.8 10*3/uL — ABNORMAL HIGH (ref 4.0–10.5)
nRBC: 0 % (ref 0.0–0.2)

## 2020-08-18 LAB — FERRITIN: Ferritin: 614 ng/mL — ABNORMAL HIGH (ref 24–336)

## 2020-08-18 LAB — BLOOD GAS, ARTERIAL
Acid-base deficit: 9.5 mmol/L — ABNORMAL HIGH (ref 0.0–2.0)
Bicarbonate: 15.6 mmol/L — ABNORMAL LOW (ref 20.0–28.0)
FIO2: 0.55
MECHVT: 520 mL
O2 Saturation: 97.9 %
PEEP: 5 cmH2O
Patient temperature: 37
RATE: 22 resp/min
pCO2 arterial: 31 mmHg — ABNORMAL LOW (ref 32.0–48.0)
pH, Arterial: 7.31 — ABNORMAL LOW (ref 7.350–7.450)
pO2, Arterial: 111 mmHg — ABNORMAL HIGH (ref 83.0–108.0)

## 2020-08-18 LAB — HEPATITIS B SURFACE ANTIBODY, QUANTITATIVE: Hep B S AB Quant (Post): <3.1 m[IU]/mL — ABNORMAL LOW (ref >9.9)

## 2020-08-18 LAB — GLUCOSE, CAPILLARY
Glucose-Capillary: 124 mg/dL — ABNORMAL HIGH (ref 70–99)
Glucose-Capillary: 221 mg/dL — ABNORMAL HIGH (ref 70–99)
Glucose-Capillary: 73 mg/dL (ref 70–99)
Glucose-Capillary: 79 mg/dL (ref 70–99)
Glucose-Capillary: 83 mg/dL (ref 70–99)
Glucose-Capillary: 89 mg/dL (ref 70–99)

## 2020-08-18 LAB — HEPARIN LEVEL (UNFRACTIONATED): Heparin Unfractionated: 0.42 IU/mL (ref 0.30–0.70)

## 2020-08-18 LAB — FIBRIN DERIVATIVES D-DIMER (ARMC ONLY): Fibrin derivatives D-dimer (ARMC): 4659.47 ng/mL (FEU) — ABNORMAL HIGH (ref 0.00–499.00)

## 2020-08-18 LAB — T3: T3, Total: 63 ng/dL — ABNORMAL LOW (ref 71–180)

## 2020-08-18 LAB — C-REACTIVE PROTEIN: CRP: 18.8 mg/dL — ABNORMAL HIGH (ref <1.0)

## 2020-08-18 LAB — MRSA PCR SCREENING: MRSA by PCR: NEGATIVE

## 2020-08-18 IMAGING — DX DG ABDOMEN 1V
2 series · 2 of 2 positions shown · non-contrast
Comparison: [DATE]

CLINICAL DATA: Gastroenteritis.

EXAM:
ABDOMEN - 1 VIEW

[abdomen supine (1 of 2)]
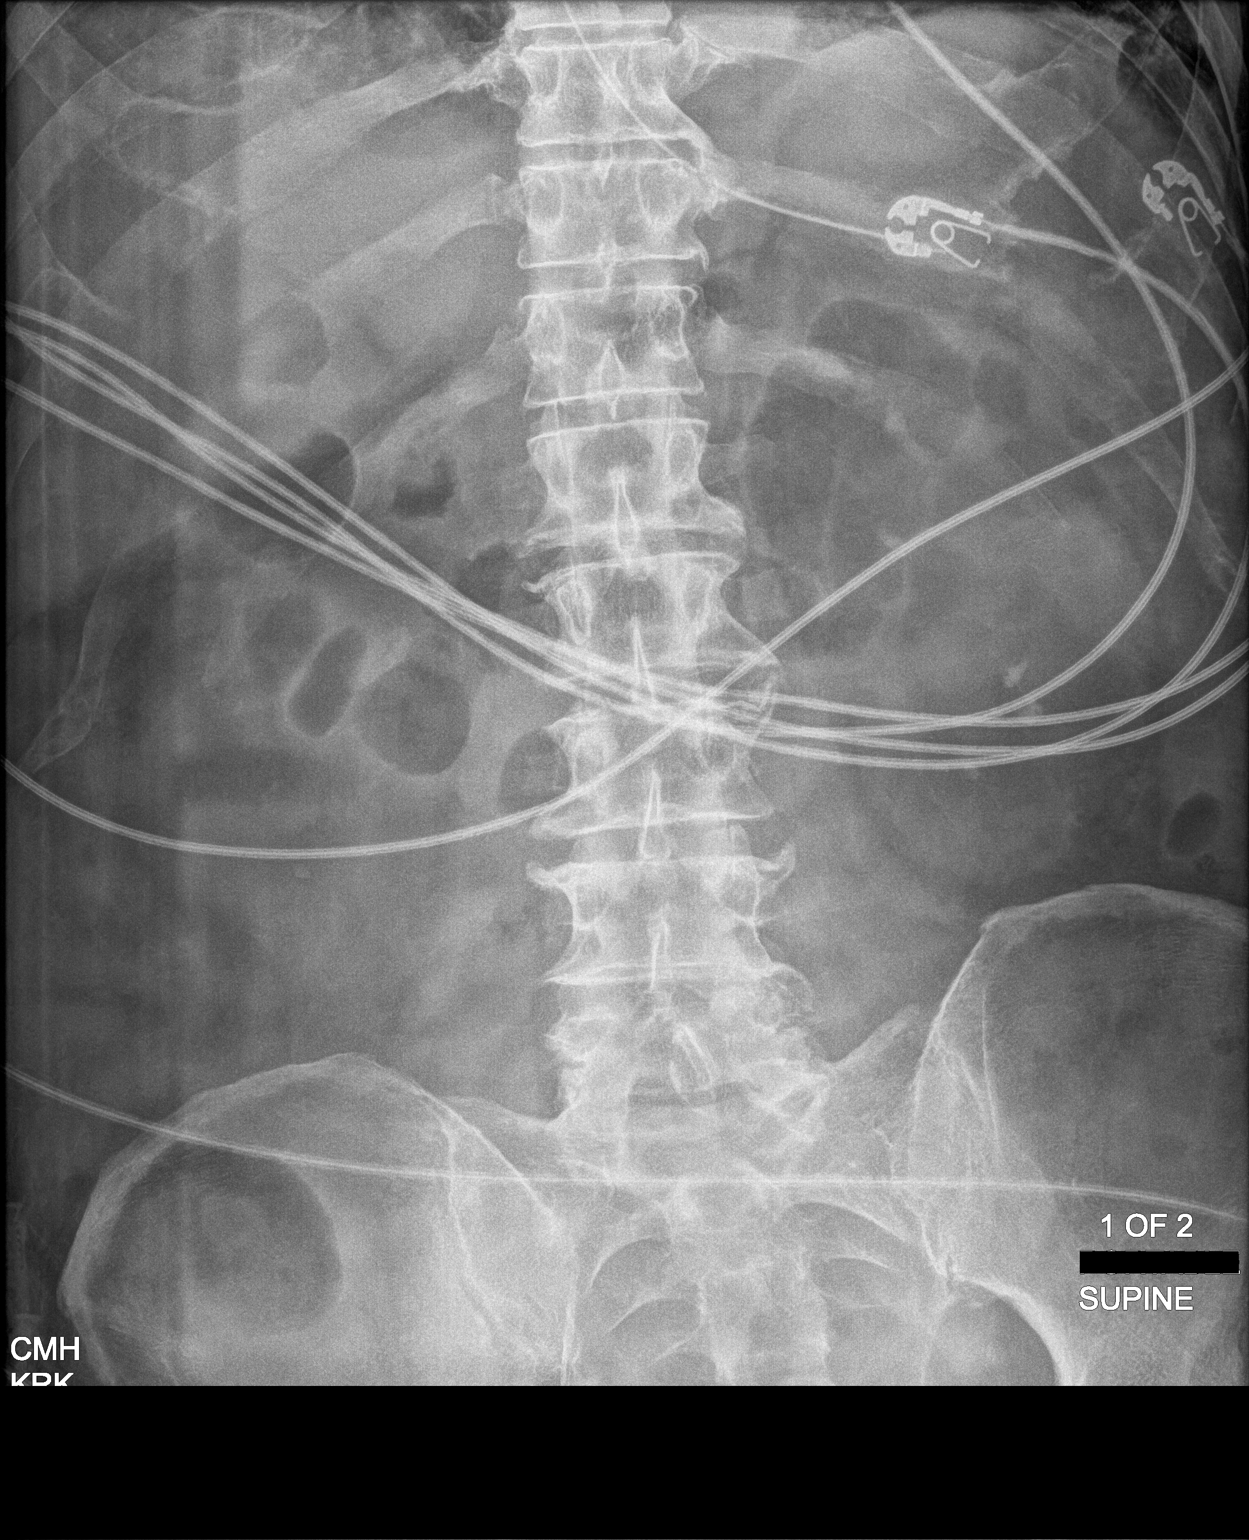

[abdomen supine (2 of 2)]
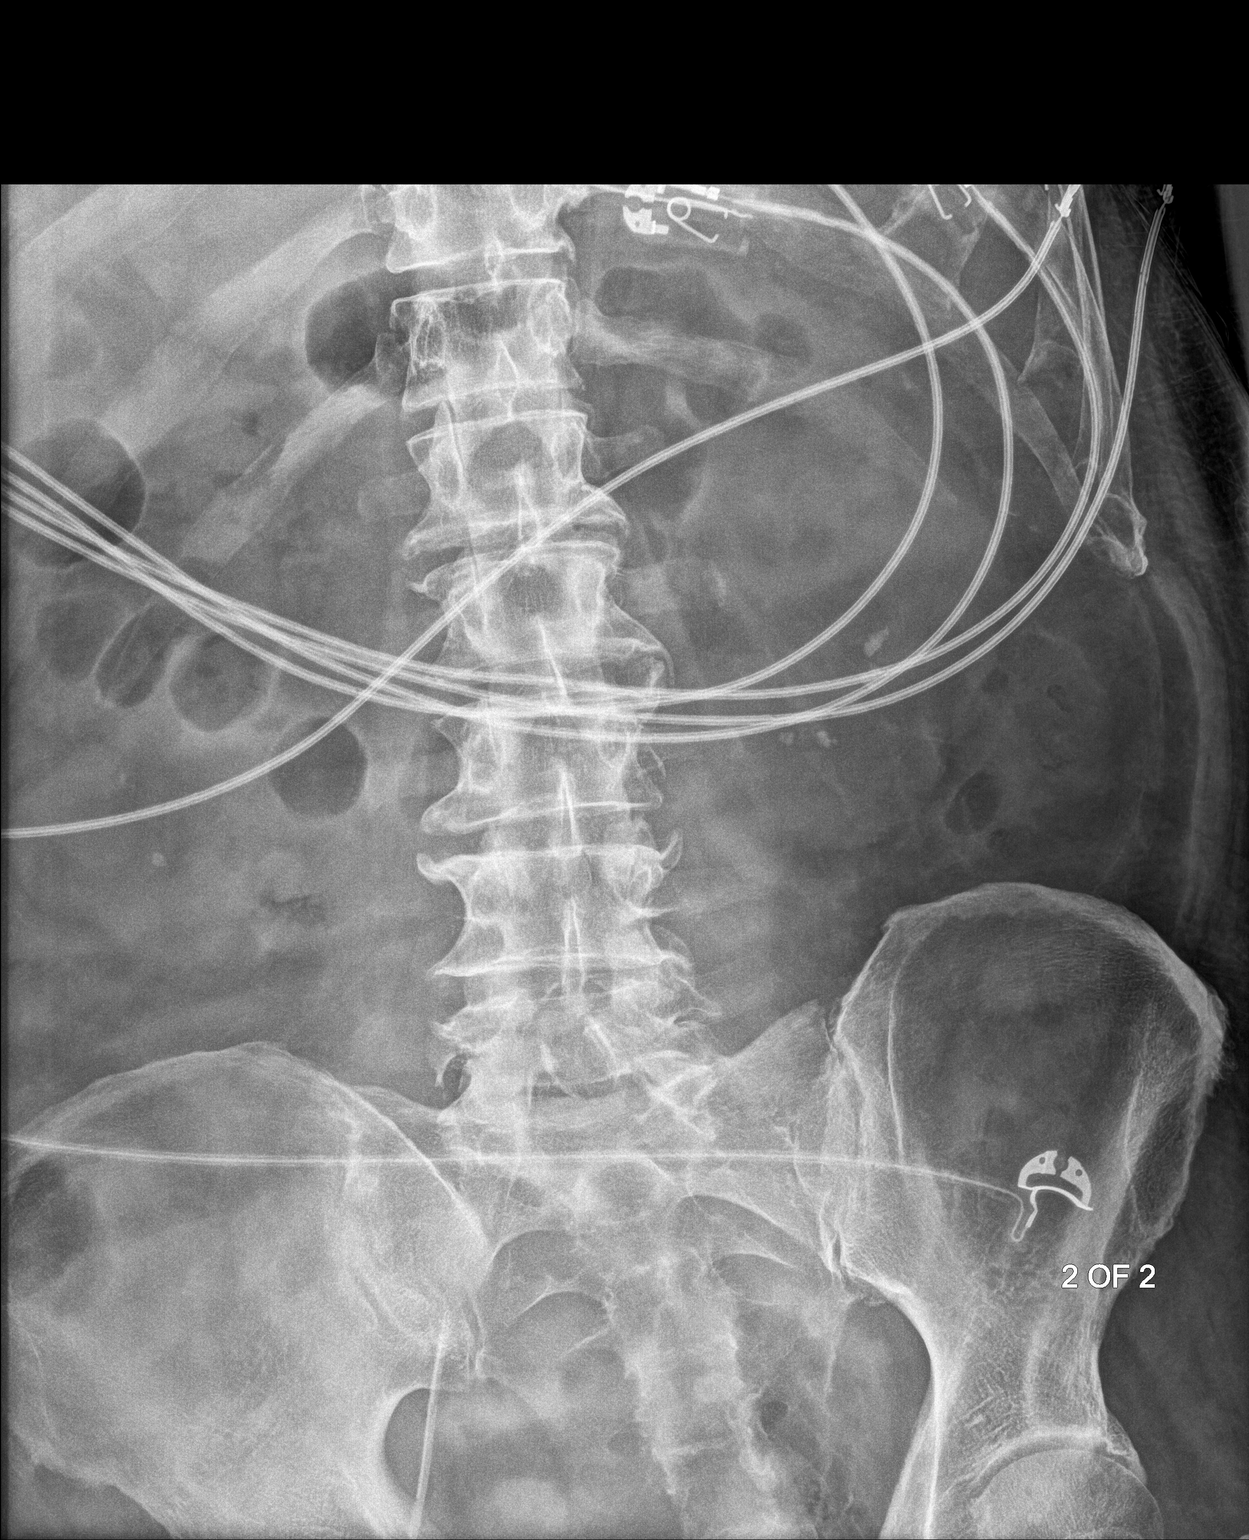

[2 of 2 positions shown; findings below may reference images not displayed]

FINDINGS: Nasogastric tube is present with tip over the stomach in the left
upper quadrant and side-port in the region of the gastroesophageal
junction. This could be advanced another 6-8 cm. Bowel gas pattern
is nonobstructive. There are a few small calcifications over the
kidneys bilaterally likely nephrolithiasis. Catheter tip is seen
from below over the right pelvis with tip adjacent the level of the
right inferior sacroiliac joint likely femoral venous catheter.
IMPRESSION: 1. Nonobstructive bowel gas pattern.
2. Bilateral nephrolithiasis.
3. Nasogastric tube with tip over the stomach in the left upper
quadrant and side-port in the region of the gastroesophageal
junction.

## 2020-08-18 IMAGING — DX DG CHEST 1V PORT
1 series · 1 of 1 positions shown · non-contrast
Comparison: [DATE]

CLINICAL DATA: Endotracheal tube

EXAM:
PORTABLE CHEST 1 VIEW

[chest ap]
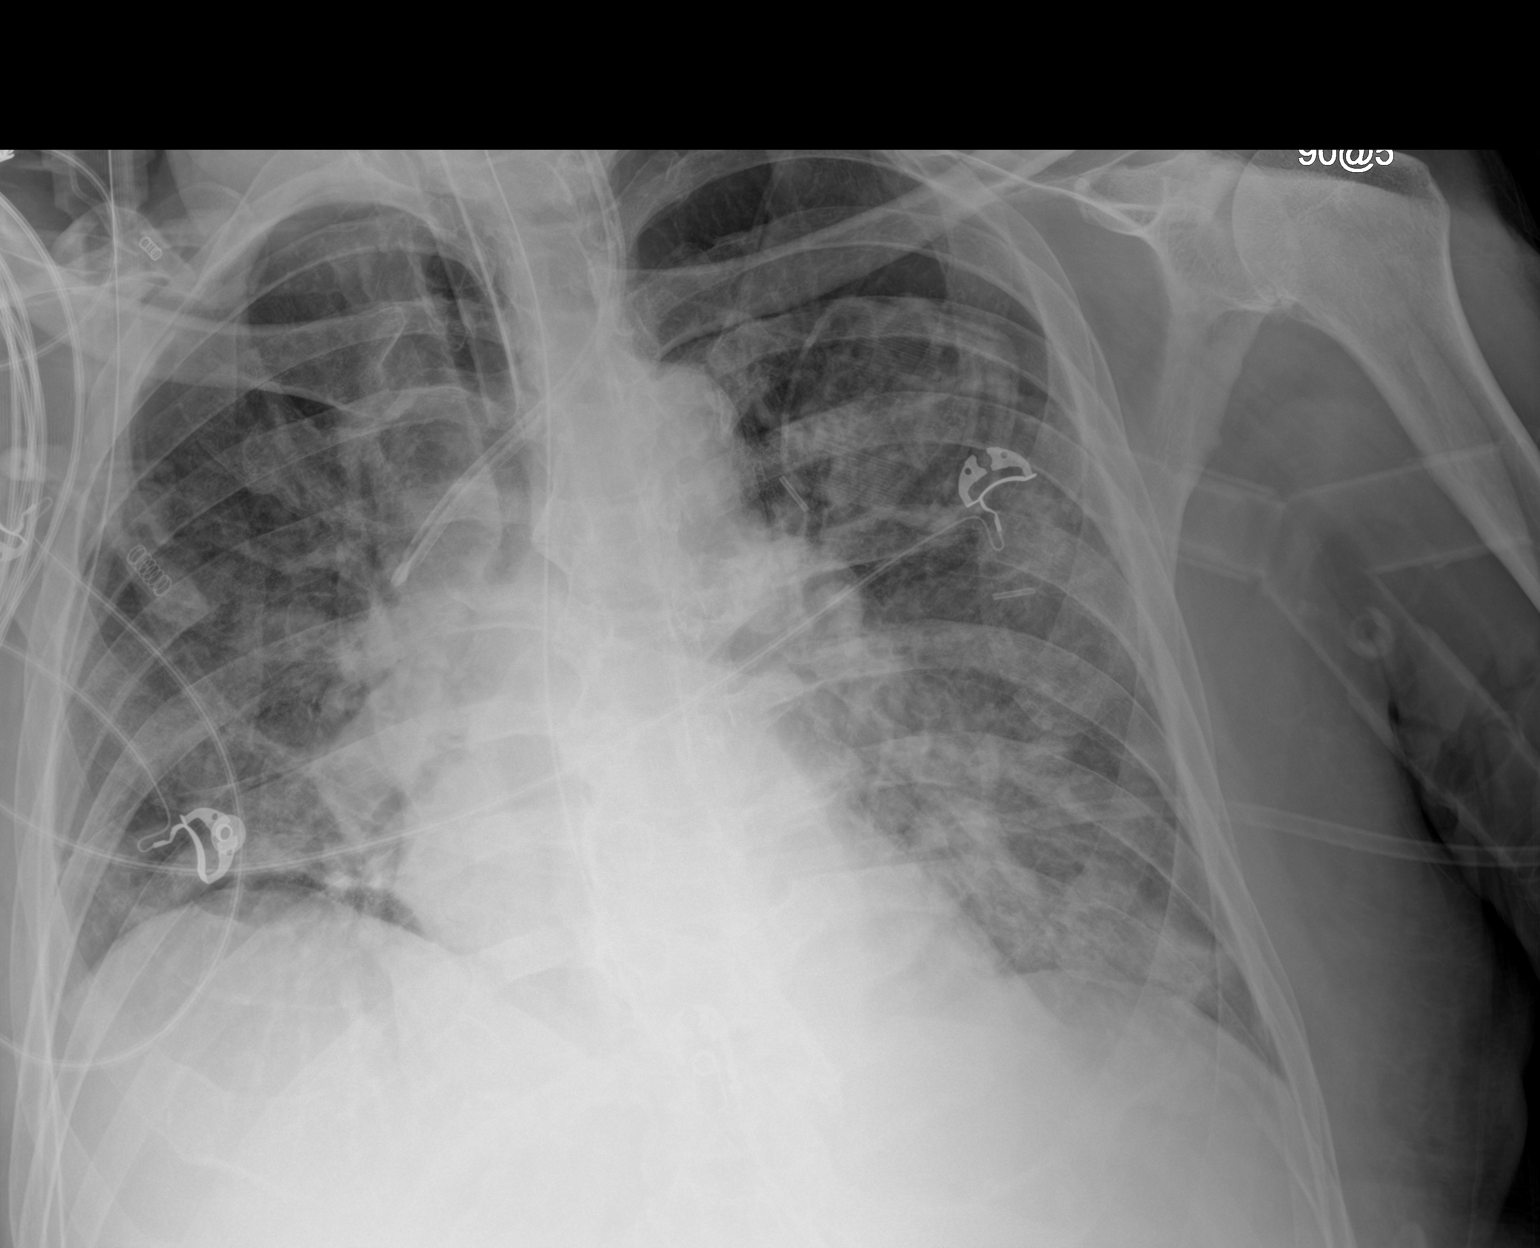

[1 of 1 positions shown; findings below may reference images not displayed]

FINDINGS: The endotracheal tube terminates above the carina. The dialysis
catheter is stable in positioning. The enteric tube extends below
the left hemidiaphragm. Hazy airspace opacities are noted
bilaterally which have worsened since the prior study, especially in
the left lower lobe. There is no pneumothorax. There may be small
bilateral pleural effusions.
IMPRESSION: 1. Lines and tubes as above.
2. Worsening bilateral hazy airspace opacities, especially in the
left lower lobe.

## 2020-08-18 MED ORDER — DILTIAZEM HCL-DEXTROSE 125-5 MG/125ML-% IV SOLN (PREMIX)
5.0000 mg/h | INTRAVENOUS | Status: DC
  Administered 2020-08-18: 5 mg/h via INTRAVENOUS
  Administered 2020-08-19 – 2020-08-20 (×5): 12.5 mg/h via INTRAVENOUS
  Administered 2020-08-21: 10 mg/h via INTRAVENOUS
  Filled 2020-08-18 (×7): qty 125

## 2020-08-18 MED ORDER — METOPROLOL TARTRATE 5 MG/5ML IV SOLN
5.0000 mg | Freq: Once | INTRAVENOUS | Status: AC
  Administered 2020-08-18: 5 mg via INTRAVENOUS

## 2020-08-18 MED ORDER — METOPROLOL TARTRATE 25 MG PO TABS
12.5000 mg | ORAL_TABLET | Freq: Two times a day (BID) | ORAL | Status: DC
  Administered 2020-08-18 – 2020-08-29 (×19): 12.5 mg via NASOGASTRIC
  Filled 2020-08-18 (×22): qty 1

## 2020-08-18 MED ORDER — METOPROLOL TARTRATE 5 MG/5ML IV SOLN
5.0000 mg | Freq: Four times a day (QID) | INTRAVENOUS | Status: DC | PRN
  Administered 2020-08-18 (×2): 5 mg via INTRAVENOUS
  Filled 2020-08-18 (×2): qty 5

## 2020-08-18 MED ORDER — SODIUM BICARBONATE 8.4 % IV SOLN
INTRAVENOUS | Status: AC
  Filled 2020-08-18: qty 50

## 2020-08-18 MED ORDER — METOPROLOL TARTRATE 5 MG/5ML IV SOLN
INTRAVENOUS | Status: AC
  Filled 2020-08-18: qty 5

## 2020-08-18 MED ORDER — ALBUMIN HUMAN 25 % IV SOLN
12.5000 g | Freq: Once | INTRAVENOUS | Status: AC
  Administered 2020-08-18: 12.5 g via INTRAVENOUS
  Filled 2020-08-18: qty 50

## 2020-08-18 MED ORDER — SODIUM BICARBONATE 8.4 % IV SOLN
100.0000 meq | Freq: Once | INTRAVENOUS | Status: AC
  Administered 2020-08-18: 100 meq via INTRAVENOUS
  Filled 2020-08-18: qty 50

## 2020-08-18 MED ORDER — STERILE WATER FOR INJECTION IV SOLN
Freq: Once | INTRAVENOUS | Status: AC
  Filled 2020-08-18: qty 150

## 2020-08-18 NOTE — Progress Notes (Signed)
Pharmacy Antibiotic Note  Nathaniel Thomas is a 70 y.o. male admitted on 08/16/2020 with sepsis in setting of COVID-19 infection. There is concern for superimposed bacterial infection. Patient further with severe DKA. Patient was intubated 1/10 and remains sedated on mechanical ventilation. He is hypotensive requiring blood pressure support with Levophed. Pharmacy has been consulted for cefepime dosing. -off CRRT- now on HD  Plan: Cefepime 2 g q12h > q24h. No longer on CRRT. Will dose in the evening to give after dialysis when indicated. -to get HD 1/15   Height: 6\' 4"  (193 cm) Weight: 94.3 kg (207 lb 14.3 oz) IBW/kg (Calculated) : 86.8  Temp (24hrs), Avg:100.1 F (37.8 C), Min:99.14 F (37.3 C), Max:101.48 F (38.6 C)  Recent Labs  Lab 08/15/2020 0950 09/03/2020 1136 08/10/2020 1615 08/14/20 0957 08/14/20 1758 08/15/20 0458 08/15/20 0831 08/16/20 0416 08/16/20 1513 08/17/20 0439 08/17/20 0835 08/18/20 0429  WBC 23.2*  --    < > 34.7*  --  27.9*  --  24.9*  --   --  22.9* 16.8*  CREATININE 2.01*  --    < > 3.14*   < >  --  4.59* 5.74* 6.12* 5.23*  --  6.13*  LATICACIDVEN 4.4* 3.8*  --   --   --   --   --   --   --   --   --   --    < > = values in this interval not displayed.    Estimated Creatinine Clearance: 13.8 mL/min (A) (by C-G formula based on SCr of 6.13 mg/dL (H)).    Allergies  Allergen Reactions  . Demerol Other (See Comments)    Blood pressure dropped completely out  . Meperidine Other (See Comments)    Drops blood pressure out  Blood pressure dropped completely out  . Meperidine Hcl Other (See Comments)    Drops blood pressure out  . Nitrospan [Nitroglycerin] Other (See Comments)    Blood pressure dropped out  . Sulfa Antibiotics Other (See Comments)    Urinary burning; blisters   . Sulfasalazine Other (See Comments)    Urinary burning; blisters     Antimicrobials this admission: Vancomycin 1/10 x 1  Cefepime 1/10 >>    Microbiology results: 1/10  BCx: NGTD 1/10 UCx: no growth    Thank you for allowing pharmacy to be a part of this patient's care.  Mavrik Bynum A, PharmD 08/18/2020 3:36 PM

## 2020-08-18 NOTE — Progress Notes (Signed)
CRITICAL CARE NOTE   Brief History:   70 y.o.malewith PMH as noted below who presents with altered mental status after he was found unresponsive this morning.   Per EMS he was initially hypoxic although this appears to have resolved. The patient himself is unable to give any history.  Patient Dx with COVID 1st week of Jan 2022 +NVD for several days ER shows severe acidosis with elevated sugars Patient with severe hypothermia and intubated for severe resp failure  1/10 admitted to ICU, severe DKA, gastroentertitis 1/11 severe resp failure 1/12 severe DKA 1/13 severe resp failure, worsening Renal failure 1/14- Reviewed care plan with cardiologist Dr Ubaldo Glassing, plan for cardiac cardoversion. Patient remains in shock with AFRVR on MV, medically optimizing for procedure.  I met with daughter York Cerise.   1/15- patient improved slightly, now with rate controlled AF off Neo. Discussed case with cardiology.   CC  follow up respiratory failure  SUBJECTIVE Patient remains critically ill Prognosis is guarded   BP 112/68   Pulse (!) 132   Temp 99.68 F (37.6 C)   Resp 20   Ht 6' 4"  (1.93 m)   Wt 94.3 kg   SpO2 97%   BMI 25.31 kg/m    I/O last 3 completed shifts: In: 6209.8 [I.V.:4394; NG/GT:1625; IV Piggyback:190.8] Out: 190 [Urine:190] No intake/output data recorded.  SpO2: 97 % FiO2 (%): 55 %  Estimated body mass index is 25.31 kg/m as calculated from the following:   Height as of this encounter: 6' 4"  (1.93 m).   Weight as of this encounter: 94.3 kg.  SIGNIFICANT EVENTS   REVIEW OF SYSTEMS  PATIENT IS UNABLE TO PROVIDE COMPLETE REVIEW OF SYSTEMS DUE TO SEVERE CRITICAL ILLNESS   Pressure Injury 08/15/20 Sacrum Medial;Lower Deep Tissue Pressure Injury - Purple or maroon localized area of discolored intact skin or blood-filled blister due to damage of underlying soft tissue from pressure and/or shear. bruised looking non-blanchabl (Active)  08/15/20 1200  Location:  Sacrum  Location Orientation: Medial;Lower  Staging: Deep Tissue Pressure Injury - Purple or maroon localized area of discolored intact skin or blood-filled blister due to damage of underlying soft tissue from pressure and/or shear.  Wound Description (Comments): bruised looking non-blanchable purple on soft tissue, Serous blisters bilaterally  Present on Admission:     PHYSICAL EXAMINATION: GENERAL:critically ill appearing, on MV NECK: Supple.  PULMONARY: +rhonchi, MV sounds CARDIOVASCULAR: S1 and S2.  GASTROINTESTINAL: Soft, nontender, +Positive bowel sounds.  MUSCULOSKELETAL: No swelling, clubbing, or edema.  NEUROLOGIC: obtunded, GCS4T SKIN:+sacral decub    MEDICATIONS: I have reviewed all medications and confirmed regimen as documented   CULTURE RESULTS   Recent Results (from the past 240 hour(s))  Blood Culture (routine x 2)     Status: None (Preliminary result)   Collection Time: 08/18/2020  9:50 AM   Specimen: BLOOD  Result Value Ref Range Status   Specimen Description BLOOD LEFT AC  Final   Special Requests   Final    BOTTLES DRAWN AEROBIC AND ANAEROBIC Blood Culture adequate volume   Culture   Final    NO GROWTH 4 DAYS Performed at [facility], [address] [place], [place] [address]    Report Status PENDING  Incomplete  Blood Culture (routine x 2)     Status: None (Preliminary result)   Collection Time: 08/23/2020  9:50 AM   Specimen: BLOOD  Result Value Ref Range Status   Specimen Description BLOOD RIGHT [facility]  Final   Special Requests  Final    BOTTLES DRAWN AEROBIC AND ANAEROBIC Blood Culture results may not be optimal due to an excessive volume of blood received in culture bottles   Culture   Final    NO GROWTH 4 DAYS Performed at [facility], [address] [place], [place] [address]    Report Status PENDING  Incomplete  Urine culture     Status: None   Collection Time: 08/19/2020 11:36 AM   Specimen: Urine, Random  Result Value  Ref Range Status   Specimen Description   Final    URINE, RANDOM Performed at [facility], [address], [place], [place] [address]    Special Requests   Final    NONE Performed at [facility], [address], [place], [place] [address]    Culture   Final    NO GROWTH Performed at [facility], [place] [address], [place], [place] [address]    Report Status 08/14/2020 FINAL  Final          IMAGING    US Venous Img Lower Bilateral (DVT)  Result Date: 08/17/2020 CLINICAL DATA:  Lower extremity edema EXAM: BILATERAL LOWER EXTREMITY VENOUS DOPPLER ULTRASOUND BILATERAL LOWER EXTREMITY VENOUS DOPPLER ULTRASOUND TECHNIQUE: Gray-scale sonography with graded compression, as well as color Doppler and duplex ultrasound were performed to evaluate the lower extremity deep venous systems from the level of the common femoral vein and including the common femoral, femoral, profunda femoral, popliteal and calf veins including the posterior tibial, peroneal and gastrocnemius veins when visible. The superficial great saphenous vein was also interrogated. Spectral Doppler was utilized to evaluate flow at rest and with distal augmentation maneuvers in the common femoral, femoral and popliteal veins. COMPARISON:  None. FINDINGS: RIGHT LOWER EXTREMITY Common Femoral Vein: No evidence of thrombus. Normal compressibility, respiratory phasicity and response to augmentation. Saphenofemoral Junction: No evidence of thrombus. Normal compressibility and flow on color Doppler imaging. Profunda Femoral Vein: No evidence of thrombus. Normal compressibility and flow on color Doppler imaging. Femoral Vein: No evidence of thrombus. Normal compressibility, respiratory phasicity and response to augmentation. Popliteal Vein: No evidence of thrombus. Normal compressibility, respiratory phasicity and response to augmentation. Calf Veins: No evidence of thrombus. Normal compressibility and flow on color  Doppler imaging. Limited visualization of peroneal veins. Superficial Great Saphenous Vein: No evidence of thrombus. Normal compressibility. Other Findings:  None. LEFT LOWER EXTREMITY Common Femoral Vein: No evidence of thrombus. Normal compressibility, respiratory phasicity and response to augmentation. Saphenofemoral Junction: No evidence of thrombus. Normal compressibility and flow on color Doppler imaging. Profunda Femoral Vein: No evidence of thrombus. Normal compressibility and flow on color Doppler imaging. Femoral Vein: No evidence of thrombus. Normal compressibility, respiratory phasicity and response to augmentation. Popliteal Vein: No evidence of thrombus. Normal compressibility, respiratory phasicity and response to augmentation. Calf Veins: No evidence of thrombus. Normal compressibility and flow on color Doppler imaging.Limited visualization of peroneal veins. Superficial Great Saphenous Vein: No evidence of thrombus. Normal compressibility. Other Findings:  None. IMPRESSION: No evidence of deep venous thrombosis in either lower extremity. Electronically Signed   By: Miachel Roux M.D.   On: 08/17/2020 10:08   DG Chest Port 1 View  Result Date: 08/18/2020 CLINICAL DATA:  Endotracheal tube EXAM: PORTABLE CHEST 1 VIEW COMPARISON:  August 16, 2020 FINDINGS: The endotracheal tube terminates above the carina. The dialysis catheter is stable in positioning. The enteric tube extends below the left hemidiaphragm. Hazy airspace opacities are noted bilaterally which have worsened since  the prior study, especially in the left lower lobe. There is no pneumothorax. There may be small bilateral pleural effusions. IMPRESSION: 1. Lines and tubes as above. 2. Worsening bilateral hazy airspace opacities, especially in the left lower lobe. Electronically Signed   By: Constance Holster M.D.   On: 08/18/2020 06:03     Nutrition Status: Nutrition Problem: Moderate Malnutrition Etiology: chronic illness  (CKD) Signs/Symptoms: mild fat depletion,mild muscle depletion Interventions: Tube feeding     Indwelling Urinary Catheter continued, requirement due to   Reason to continue Indwelling Urinary Catheter strict Intake/Output monitoring for hemodynamic instability         Ventilator continued, requirement due to severe respiratory failure   Ventilator Sedation RASS 0 to -2             82.8 cm/sec peak vel    2.7 mmHg peak grad  _________________________________________________________________________________________  INTERPRETATION  NORMAL LEFT VENTRICULAR SYSTOLIC FUNCTION  WITH MILD LVH  NORMAL RIGHT VENTRICULAR SYSTOLIC FUNCTION  MILD VALVULAR REGURGITATION (See above)  NO VALVULAR STENOSIS  MILD TR, PR  TRIVIAL MR  EF 45-50%  _________________________________________________________________________________________  Electronically signed by  Lujean Amel, MD on 01/25/2018 07: 58 AM      Performed By: Maurilio Lovely, [place]  Ordering Physician: Lujean Amel  _________________________________________________________________________________________  Other Result Text  Interface, Text Results In - 01/25/2018 7:58 AM EDT            CARDIOLOGY DEPARTMENT          [place], [place]                  [id number]       [place] #: [id number]       [address], [place], [place] [address]    Date: 01/20/2018 10: 04 AM                                Adult  Male  Age: 39 yrs       ECHOCARDIOGRAM REPORT              Outpatient                                [id number]^^[id number]    STUDY:CHEST WALL        TAPE:          MD1: CALLWOOD, DWAYNE DENNIS     ECHO:Yes  DOPPLER:Yes    FILE:          BP: 122/72 mmHg    COLOR:Yes   CONTRAST:No   MACHINE:Philips  RV BIOPSY:No     3D:No SOUND QLTY:Moderate      Height: 74 in    MEDIUM:None                       Weight: 208 lb                               BSA: 2.2 m2  _________________________________________________________________________________________         HISTORY: DOE         REASON: Assess, LV function       INDICATION: CAD in native artery [I25.10 (ICD-10-CM)]  _________________________________________________________________________________________  ECHOCARDIOGRAPHIC MEASUREMENTS  2D DIMENSIONS  AORTA         Values  Normal Range  MAIN PA     Values  Normal Range         Annulus: 2.2 cm    [2.3-2.9]     PA Main: nm*    [1.5-2.1]        Aorta Sin: 3.5 cm    [3.1-3.7]  RIGHT VENTRICLE       ST Junction: nm*     [2.6-3.2]     RV Base: nm*    [<4.2]        Asc.Aorta: nm*     [2.6-3.4]     RV Mid: nm*    [<3.5]  LEFT VENTRICLE                   RV Length: 3.5 cm  [<8.6]          LVIDd: 4.8 cm    [4.2-5.9]  INFERIOR VENA CAVA          LVIDs: 3.5 cm            Max. IVC: nm*   [<=2.1]           FS: 26.6 %    [>25]      Min. IVC: nm*           SWT: 1.3 cm    [0.6-1.0]  ------------------           PWT: 1.2 cm    [0.6-1.0]  nm* - not measured  LEFT ATRIUM         LA Diam: 4.0 cm    [3.0-4.0]       LA A4C Area: nm*     [<20]        LA Volume: nm*     [18-58]  _________________________________________________________________________________________  ECHOCARDIOGRAPHIC DESCRIPTIONS  AORTIC ROOT         Size: Normal       Dissection: INDETERM FOR DISSECTION  AORTIC VALVE        Leaflets: Tricuspid          Morphology: Normal         Mobility: Fully mobile  LEFT VENTRICLE          Size: Normal            Anterior: Normal       Contraction: Normal             Lateral: Normal       Closest EF: 50% (Estimated)         Septal: Normal        LV Masses: No Masses            Apical: Normal          LVH: MILD LVH           Inferior: Normal                            Posterior: Normal      Dias.FxClass: N/A  MITRAL VALVE        Leaflets: Normal            Mobility: Fully mobile      Morphology: Normal  LEFT ATRIUM          Size: Normal            LA Masses: No masses  IA Septum: Normal IAS  MAIN PA          Size: Normal  PULMONIC VALVE       Morphology: Normal            Mobility: Fully mobile  RIGHT VENTRICLE        RV Masses: No Masses             Size: Normal        Free Wall: Normal           Contraction: Normal  TRICUSPID VALVE        Leaflets: Normal           Mobility: Fully mobile       Morphology: Normal  RIGHT ATRIUM          Size: Normal            RA Other: None         RA Mass: No masses  PERICARDIUM          Fluid: No effusion  INFERIOR VENACAVA          Size: Normal Normal respiratory collapse  _________________________________________________________________________________________   DOPPLER ECHO and OTHER SPECIAL PROCEDURES         Aortic: No AR           No AS             101.8 cm/sec peak vel   4.1 mmHg peak grad         Mitral: TRIVIAL MR         No MS             3.6 cm^2 by DOPPLER             MV Inflow E Vel = 39.8 cm/sec   MV Annulus E'Vel = 4.9 cm/sec            E/E'Ratio = 8.1         Tricuspid: MILD TR          No TS             224.0 cm/sec peak TR vel  25.1 mmHg peak RV pressure        Pulmonary: MILD PR          No PS             82.8 cm/sec peak vel    2.7 mmHg peak grad  _________________________________________________________________________________________  INTERPRETATION  NORMAL LEFT VENTRICULAR SYSTOLIC FUNCTION  WITH MILD LVH  NORMAL RIGHT VENTRICULAR SYSTOLIC FUNCTION  MILD VALVULAR REGURGITATION (See above)  NO VALVULAR STENOSIS  MILD TR, PR  TRIVIAL MR  EF 45-50%  _________________________________________________________________________________________  Electronically signed by  Lujean Amel, MD on 01/25/2018 07: 57 AM      Performed By: Maurilio Lovely, RDCS   Ordering Physician: Lujean Amel  _________________________________________________________________________________________ Specimen Collected: - Last Resulted: -  Date: 01/25/18   Received From: [facility]  Severe ACUTE Hypoxic and Hypercapnic Respiratory Failure due to severe metabolic acidosis due to severe DKA due to severe viral gastroenteritis due to COVID 19 infection complicated by acute renal failure Dx of DKA with diabetic coma    Acute hypoxemic respiratory failure due to COVID-19 pneumonia / ARDS          -Initially + 1st week of Jan 2022 - now finished full course antiviral therapy  Mechanical ventilation via  ARDS protocol, target PRVC 6 cc/kg -Diuresis when out of shock physiology - monitor UOP - utilize external urinary catheter if possible -supportive care with ICU telemetry monitoring -procalcitonin, CRP and ferritin trending    Atrial fibrillation with RVR  - cardiology on case - discussed with Dr Ubaldo Glassing today   - currently on amiodarone gtt - plan for cardioversion   -Shock physiology improved MAP>>>70 - have dcd neosyneprhine , re-initiating  beta blockade today , weaning off vasopressin     ACUTE KIDNEY INJURY/Renal Failure -continue Foley Catheter-assess need -Avoid nephrotoxic agents- dcd MVT -Follow urine output, BMP -Ensure adequate renal perfusion, optimize oxygenation -Renal dose medications  - vasc cath placement and HD today     NEUROLOGY Acute toxic metabolic encephalopathy, need for sedation Goal RASS -2 to -3  Circulatory shock Etiology - possible distributive due to Sepsis pneumonia - present on admission due to Ballinger vs obstructive due to acute PE vs cadiogenic in context of tachycardia induced cardiomyopathy with AFRVR -will optimize medically by decreasing arythmogenic potential - will minimize neosynephrine and swtich to vasopressin with goal to be off all vasopressors asap - continue ketamine infusion for sedation - note most recent TTE in 2019 with EF 50% and adequate parameters in diastology with minimal vavular disease -cardiology on case - plan for possible cardiovert today - continue with amiodarone -use vasopressors to keep MAP>65 -follow ABG and LA -follow up cultures -emperic ABX -CXR with interstitial congestion but clear costophrenic angles - continue rescusitative fluids for now       - tracheal aspirate in process with microbiology  ID -continue IV abx as prescibed -follow up cultures  GI GI PROPHYLAXIS as indicated   DIET-->TF's as tolerated Constipation protocol as indicated  ENDO - will use ICU hypoglycemic\Hyperglycemia protocol if indicated     ELECTROLYTES -follow labs as needed -replace as needed -pharmacy consultation and following   DVT/GI PRX ordered and assessed TRANSFUSIONS AS NEEDED MONITOR FSBS I Assessed the need for Labs I Assessed the need for Foley I Assessed the need for Central Venous Line Family Discussion when available I Assessed the need for Mobilization I made an Assessment of medications to be adjusted accordingly Safety Risk assessment  completed   CASE DISCUSSED IN MULTIDISCIPLINARY ROUNDS WITH ICU TEAM  Critical Care Time devoted to patient care services described in this note is 32 minutes.   Overall, patient is critically ill, prognosis is guarded.  Patient with Multiorgan failure and at high risk for cardiac arrest and death.     Ottie Glazier, M.D.  [facility]

## 2020-08-18 NOTE — Progress Notes (Signed)
Pt given additional dose of digoxin 125 mcg per cardiology's recommendations, without impact on the pt.'s HR. Able to wean vasopressors off, but HR remains > 125. Metoprolol 5 mg administered and HR now controlled 80's-100's, but remains in Afib/flutter. Will continue to monitor patient closely.  Domingo Pulse Rust-[place], AGACNP-BC Acute Care Nurse Practitioner [facility]   [phone] / [phone] Please see Amion for pager details.

## 2020-08-18 NOTE — Progress Notes (Signed)
ANTICOAGULATION CONSULT NOTE  Pharmacy Consult for heparin Indication: atrial fibrillation  Patient Measurements: Heparin Dosing Weight: 90 kg  Labs: Recent Labs    08/16/20 0416 08/16/20 1339 08/16/20 1513 08/16/20 2144 08/17/20 0439 08/17/20 0835 08/17/20 1729 08/18/20 0429  HGB 12.6*  --   --   --   --  13.9  --  11.6*  HCT 36.5*  --   --   --   --  41.0  --  33.5*  PLT 158  --   --   --   --  188  --  123*  HEPARINUNFRC  --   --   --    < >  --  0.54 0.56 0.42  CREATININE 5.74*  --  6.12*  --  5.23*  --   --   --   CKMB  --  28.3*  --   --   --   --   --   --   TROPONINIHS  --  1,874* 1,755*  --   --   --   --   --    < > = values in this interval not displayed.    Estimated Creatinine Clearance: 16.1 mL/min (A) (by C-G formula based on SCr of 5.23 mg/dL (H)).   Medical History: Past Medical History:  Diagnosis Date  . CAD (coronary artery disease)   . Cataract   . CHICKENPOX, HX OF 08/30/2008   Qualifier: Diagnosis of  By: [name], [name]    . Chronic kidney disease   . Diabetes mellitus   . Glaucoma   . History of urinary calculi 08/30/2008   Qualifier: Diagnosis of  By: [name], Jacelyn Pi   . Hyperlipidemia   . Hypertension   . Renal calculus or stone   . Skin cancer of nose    ears and hands   Assessment: 70 year old male admitted with severe DKA, which has since resolved and patient now on SQ insulin regimen. Patient with worsening renal function, plan to start HD 1/13. Patient with new afib 1/13, started on amiodarone and heparin.  Goal of Therapy:  Heparin level 0.3-0.7 units/ml Monitor platelets by anticoagulation protocol: Yes   Plan:  --1/15 at 0429 HL = 0.42, therapeutic x 3. CBC is worse today, PLTs trending down, monitor closely.   --Will continue heparin infusion at current rate of 1200 units/hr --Re-check HL tomorrow AM --Daily CBC per protocol while on heparin infusion  Hart Robinsons A 08/18/2020,5:33 AM

## 2020-08-18 NOTE — Progress Notes (Signed)
[address] [place], [place] [address] Phone [phone]. Fax [phone]  Date: 08/18/2020                  Patient Name:  Nathaniel Thomas  MRN: [id number]  DOB: 16-Jul-1951  Age / Sex: 70 y.o., male         PCP: Dion Body, MD                  Presenting Illness: Patient is a 70 y.o. male  admitted to [facility] on 08/14/2020 for evaluation of Metabolic acidosis [G62.6] Encounter for central line placement [Z45.2] Acute renal failure (ARF) ([place]) [N17.9] Acute respiratory failure with hypoxia ([place]) [J96.01] Sepsis, due to unspecified organism, unspecified whether acute organ dysfunction present ([place]) [A41.9] COVID-19 [U07.1]   Patient presented to the ER via EMS from home for altered mental status.  Recently treated for COVID.  Per triage notes, patient was prescribed antibiotics for UTI but is unable to take due to lethargy.  Upon arrival patient was responsive to painful stimuli. Also reported to have nausea vomiting and diarrhea for several days prior to admission.  Initial evaluation showed severe acidosis with elevated blood sugars.  Also noted to have hypothermia.  He was intubated for severe acute respiratory failure.  Initially on presentation, pH of 6.99. Patient was treated with ivermectin as outpatient for COVID.  He is unvaccinated.  Hospital course: 08/15/2020:-Patient being monitored in the ICU.  Ventilator dependent.  FiO2 35%.  Urine output has decreased significantly.  Daughter at bedside. 1/13- Vascath placed. Plan for HD today. TF @45  cc/min. Sedated. No pressors 1/14- now on amiodarone, vasopressin. Ketamine, fentanyl, phenylephrine. UOP remains poor.  1/15-  Remains critically ill.  Still on the vent. Due for another dialysis session today. Poor urine output.   01/14 0701 - 01/15 0700 In: 4417.1 [I.V.:3532.1; NG/GT:785; IV Piggyback:100] Out: 55 [Urine:55]    Vital Signs: Blood pressure 106/73, pulse (!) 104, temperature 99.5 F (37.5 C), resp.  rate 20, height 6' 4"  (1.93 m), weight 94.3 kg, SpO2 98 %.   Intake/Output Summary (Last 24 hours) at 08/18/2020 1238 Last data filed at 08/18/2020 0600 Gross per 24 hour  Intake 4417.07 ml  Output 55 ml  Net 4362.07 ml    Weight trends: Filed Weights   08/18/2020 0947 08/15/20 0500 08/17/20 0433  Weight: 90.7 kg 90.9 kg 94.3 kg    Physical Exam: General:  Critically ill-appearing  HEENT  ET tube in place  Lungs:  Ventilator assisted, FiO2 55%  Heart::  Tachycardic, irregular  Abdomen:  Nondistended  Extremities:  Trace edema  Neurologic:  Sedated  Skin:  Warm, dry  Access:  Left IJ temp Cath 1/13- ICU team  Foley:  In place       Lab results: Basic Metabolic Panel: Recent Labs  Lab 08/16/20 1339 08/16/20 1513 08/17/20 0439 08/18/20 0429  NA  --  142 143 140  K  --  4.9 4.3 4.7  CL  --  109 111 108  CO2  --  17* 19* 16*  GLUCOSE  --  329* 229* 210*  BUN  --  124* 99* 118*  CREATININE  --  6.12* 5.23* 6.13*  CALCIUM  --  5.6* 5.9* 6.1*  MG 2.2  --  2.0 2.2  PHOS  --  7.6* 5.9* 6.6*    Liver Function Tests: Recent Labs  Lab 08/18/2020 0950 08/16/20 0416 08/18/20 0429  AST 32  --   --  ALT 22  --   --   ALKPHOS 72  --   --   BILITOT 1.9*  --   --   PROT 8.1  --   --   ALBUMIN 3.6   < > 1.7*   < > = values in this interval not displayed.   No results for input(s): LIPASE, AMYLASE in the last 168 hours. No results for input(s): AMMONIA in the last 168 hours.  CBC: Recent Labs  Lab 08/06/2020 0950 08/16/2020 1615 08/17/20 0835 08/18/20 0429  WBC 23.2*   < > 22.9* 16.8*  NEUTROABS 16.1*  --   --   --   HGB 16.4   < > 13.9 11.6*  HCT 53.9*   < > 41.0 33.5*  MCV 97.3   < > 88.2 86.8  PLT 514*   < > 188 123*   < > = values in this interval not displayed.    Cardiac Enzymes: No results for input(s): CKTOTAL, TROPONINI in the last 168 hours.  BNP: Invalid input(s): POCBNP  CBG: Recent Labs  Lab 08/17/20 1935 08/17/20 2314 08/18/20 0312  08/18/20 0753 08/18/20 1114  GLUCAP 239* 265* 221* 124* 83    Microbiology: Recent Results (from the past 720 hour(s))  Blood Culture (routine x 2)     Status: None   Collection Time: 08/26/2020  9:50 AM   Specimen: BLOOD  Result Value Ref Range Status   Specimen Description BLOOD LEFT AC  Final   Special Requests   Final    BOTTLES DRAWN AEROBIC AND ANAEROBIC Blood Culture adequate volume   Culture   Final    NO GROWTH 5 DAYS Performed at [facility], [address] [place], [place] [address]    Report Status 08/18/2020 FINAL  Final  Blood Culture (routine x 2)     Status: None   Collection Time: 08/19/2020  9:50 AM   Specimen: BLOOD  Result Value Ref Range Status   Specimen Description BLOOD RIGHT AC  Final   Special Requests   Final    BOTTLES DRAWN AEROBIC AND ANAEROBIC Blood Culture results may not be optimal due to an excessive volume of blood received in culture bottles   Culture   Final    NO GROWTH 5 DAYS Performed at [facility], [address] [place], [place] [address]    Report Status 08/18/2020 FINAL  Final  Urine culture     Status: None   Collection Time: 08/17/2020 11:36 AM   Specimen: Urine, Random  Result Value Ref Range Status   Specimen Description   Final    URINE, RANDOM Performed at [facility], [address] [place], [place] [address]    Special Requests   Final    NONE Performed at [facility], [address], [place], [place] [address]    Culture   Final    NO GROWTH Performed at [facility], [place] [address], [place], [place] [address]    Report Status 08/14/2020 FINAL  Final     Coagulation Studies: No results for input(s): LABPROT, INR in the last 72 hours.  Urinalysis: No results for input(s): COLORURINE, LABSPEC, PHURINE, GLUCOSEU, HGBUR, BILIRUBINUR, KETONESUR, PROTEINUR, UROBILINOGEN, NITRITE, LEUKOCYTESUR in the last 72 hours.  Invalid input(s): APPERANCEUR      Imaging: DG  Abd 1 View  Result Date: 08/18/2020 CLINICAL DATA:  Gastroenteritis. EXAM: ABDOMEN - 1 VIEW COMPARISON:  08/11/2020 FINDINGS: Nasogastric tube is present with tip over  the stomach in the left upper quadrant and side-port in the region of the gastroesophageal junction. This could be advanced another 6-8 cm. Bowel gas pattern is nonobstructive. There are a few small calcifications over the kidneys bilaterally likely nephrolithiasis. Catheter tip is seen from below over the right pelvis with tip adjacent the level of the right inferior sacroiliac joint likely femoral venous catheter. IMPRESSION: 1. Nonobstructive bowel gas pattern. 2. Bilateral nephrolithiasis. 3. Nasogastric tube with tip over the stomach in the left upper quadrant and side-port in the region of the gastroesophageal junction. Electronically Signed   By: Marin Olp M.D.   On: 08/18/2020 09:53   US Venous Img Lower Bilateral (DVT)  Result Date: 08/17/2020 CLINICAL DATA:  Lower extremity edema EXAM: BILATERAL LOWER EXTREMITY VENOUS DOPPLER ULTRASOUND BILATERAL LOWER EXTREMITY VENOUS DOPPLER ULTRASOUND TECHNIQUE: Gray-scale sonography with graded compression, as well as color Doppler and duplex ultrasound were performed to evaluate the lower extremity deep venous systems from the level of the common femoral vein and including the common femoral, femoral, profunda femoral, popliteal and calf veins including the posterior tibial, peroneal and gastrocnemius veins when visible. The superficial great saphenous vein was also interrogated. Spectral Doppler was utilized to evaluate flow at rest and with distal augmentation maneuvers in the common femoral, femoral and popliteal veins. COMPARISON:  None. FINDINGS: RIGHT LOWER EXTREMITY Common Femoral Vein: No evidence of thrombus. Normal compressibility, respiratory phasicity and response to augmentation. Saphenofemoral Junction: No evidence of thrombus. Normal compressibility and flow on color Doppler  imaging. Profunda Femoral Vein: No evidence of thrombus. Normal compressibility and flow on color Doppler imaging. Femoral Vein: No evidence of thrombus. Normal compressibility, respiratory phasicity and response to augmentation. Popliteal Vein: No evidence of thrombus. Normal compressibility, respiratory phasicity and response to augmentation. Calf Veins: No evidence of thrombus. Normal compressibility and flow on color Doppler imaging. Limited visualization of peroneal veins. Superficial Great Saphenous Vein: No evidence of thrombus. Normal compressibility. Other Findings:  None. LEFT LOWER EXTREMITY Common Femoral Vein: No evidence of thrombus. Normal compressibility, respiratory phasicity and response to augmentation. Saphenofemoral Junction: No evidence of thrombus. Normal compressibility and flow on color Doppler imaging. Profunda Femoral Vein: No evidence of thrombus. Normal compressibility and flow on color Doppler imaging. Femoral Vein: No evidence of thrombus. Normal compressibility, respiratory phasicity and response to augmentation. Popliteal Vein: No evidence of thrombus. Normal compressibility, respiratory phasicity and response to augmentation. Calf Veins: No evidence of thrombus. Normal compressibility and flow on color Doppler imaging.Limited visualization of peroneal veins. Superficial Great Saphenous Vein: No evidence of thrombus. Normal compressibility. Other Findings:  None. IMPRESSION: No evidence of deep venous thrombosis in either lower extremity. Electronically Signed   By: Miachel Roux M.D.   On: 08/17/2020 10:08   DG Chest Port 1 View  Result Date: 08/18/2020 CLINICAL DATA:  Endotracheal tube EXAM: PORTABLE CHEST 1 VIEW COMPARISON:  August 16, 2020 FINDINGS: The endotracheal tube terminates above the carina. The dialysis catheter is stable in positioning. The enteric tube extends below the left hemidiaphragm. Hazy airspace opacities are noted bilaterally which have worsened since the  prior study, especially in the left lower lobe. There is no pneumothorax. There may be small bilateral pleural effusions. IMPRESSION: 1. Lines and tubes as above. 2. Worsening bilateral hazy airspace opacities, especially in the left lower lobe. Electronically Signed   By: Constance Holster M.D.   On: 08/18/2020 06:03   Scheduled Meds: . amiodarone  150 mg Intravenous Once  . chlorhexidine gluconate (MEDLINE KIT)  15 mL Mouth Rinse BID  . Chlorhexidine Gluconate Cloth  6 each Topical Daily  . docusate  100 mg Per Tube BID  . feeding supplement (PROSource TF)  90 mL Per Tube TID  . fentaNYL (SUBLIMAZE) injection  25 mcg Intravenous Once  . hydrocortisone sod succinate (SOLU-CORTEF) inj  50 mg Intravenous Q6H  . insulin aspart  0-20 Units Subcutaneous Q4H  . insulin aspart  6 Units Subcutaneous Q4H  . insulin detemir  53 Units Subcutaneous BID  . mouth rinse  15 mL Mouth Rinse 10 times per day  . metoprolol tartrate  12.5 mg Per NG tube BID  . sodium bicarbonate      . sodium chloride flush  3 mL Intravenous Q12H   Continuous Infusions: . sodium chloride    . albumin human    . amiodarone 60 mg/hr (08/18/20 1046)  . ceFEPime (MAXIPIME) IV    . famotidine (PEPCID) IV 20 mg (08/18/20 0931)  . feeding supplement (VITAL 1.5 CAL) 1,000 mL (08/18/20 0915)  . fentaNYL infusion INTRAVENOUS 125 mcg/hr (08/18/20 0300)  . heparin 1,200 Units/hr (08/18/20 0422)  . ketamine (KETALAR) Adult IV Infusion 2 mg/kg/hr (08/18/20 0549)  . vasopressin 0.03 Units/min (08/18/20 0922)   PRN Meds:.sodium chloride, acetaminophen (TYLENOL) oral liquid 160 mg/5 mL, docusate, fentaNYL, heparin, metoprolol tartrate, polyethylene glycol, sodium chloride flush   Assessment & Plan: Pt is a 70 y.o.   male with coronary artery disease, diabetes, glaucoma, history of kidney stones, hyperlipidemia, hypertension, left knee replacement, lithotripsy, basal cell carcinoma, was admitted on 03/30/785 with Metabolic acidosis  [L54.4] Encounter for central line placement [Z45.2] Acute renal failure (ARF) ([place]) [N17.9] Acute respiratory failure with hypoxia ([place]) [J96.01] Sepsis, due to unspecified organism, unspecified whether acute organ dysfunction present ([place]) [A41.9] COVID-19 [U07.1]   #Acute kidney injury Baseline creatinine 1.0 from August 02, 2020 Presenting creatinine of 2.15 Lab Results  Component Value Date   CREATININE 6.13 (H) 08/18/2020   CREATININE 5.23 (H) 08/17/2020   CREATININE 6.12 (H) 08/16/2020     #Acute respiratory failure Requiring ventilator support.  Intubated 08/14/2020  #COVID-19 pneumonia Treated with ivermectin as outpatient  #Diabetes type 2, insulin dependent Treated with metformin, Jardiance (empagliflozin), Amaryl as outpatient Hemoglobin A1c 7.9% from June 26, 2020 Lab Results  Component Value Date   HGBA1C 7.5 08/10/2017    #Severe acidosis     Plan: From a renal perspective patient continues to have severe renal dysfunction.  He has associated oliguria and acidemia.  As such we are planning for hemodialysis session today.  Orders have been prepared.  Otherwise patient to be maintained on ventilatory support, pressors, intensive insulin regimen, and Solu-Cortef.   LOS: 5 Jeremiah Curci 1/15/202212:38 PM    Note: This note was prepared with Dragon dictation. Any transcription errors are unintentional

## 2020-08-18 NOTE — Progress Notes (Signed)
Patient Name: Nathaniel Thomas Date of Encounter: 08/18/2020  Hospital Problem List     Active Problems:   COVID-19   Malnutrition of moderate degree    Patient Profile      70 y.o.malewith history ofdiabetes, hypertension and hyperlipidemia who was admitted on January 10 of this year after altered mental status and was found to be unresponsive. He had been diagnosed with COVID 1 week prior. He had nausea vomiting and diarrhea for several days and was unable to take in medications. He was noted to be severely acidotic with elevated sugars is consistent with DKA. He had severe hypothermia and was intubated for severe respiratory failure. He has a history of being evaluated with a coronary artery CT in 2012 showing noncritical disease. He developed atrial fibrillation rapid ventricular response today. Throughout his hospital stay he has been noted to have gradually worsening renal function. His serum troponins were normal on admission. He had acute renal injury with a creatinine of 2.01 up from normal creatinine as a base line. Echo in 2019 showed ef of 50%. Has develped worsening renal funciton wit creatine of 6.12. Afib rate is still rapid but heart rate is improved with rates in the 100s to 100 teens or currently.  Blood pressure is more stable.  Subjective   Intubated and sedated  Inpatient Medications    . amiodarone  150 mg Intravenous Once  . chlorhexidine gluconate (MEDLINE KIT)  15 mL Mouth Rinse BID  . Chlorhexidine Gluconate Cloth  6 each Topical Daily  . docusate  100 mg Per Tube BID  . feeding supplement (PROSource TF)  90 mL Per Tube TID  . fentaNYL (SUBLIMAZE) injection  25 mcg Intravenous Once  . hydrocortisone sod succinate (SOLU-CORTEF) inj  50 mg Intravenous Q6H  . insulin aspart  0-20 Units Subcutaneous Q4H  . insulin aspart  6 Units Subcutaneous Q4H  . insulin detemir  53 Units Subcutaneous BID  . mouth rinse  15 mL Mouth Rinse 10 times per day  .  metoprolol tartrate  12.5 mg Per NG tube BID  . sodium bicarbonate      . sodium chloride flush  3 mL Intravenous Q12H    Vital Signs    Vitals:   08/18/20 1130 08/18/20 1200 08/18/20 1230 08/18/20 1300  BP: 125/89 139/84 (!) 135/93 (!) 133/121  Pulse: (!) 120 (!) 129 (!) 130 74  Resp: 17 20 (!) 25 (!) 23  Temp: 99.5 F (37.5 C) 99.14 F (37.3 C) 99.14 F (37.3 C) 99.32 F (37.4 C)  TempSrc:   Bladder   SpO2: 96% 97% 97% 96%  Weight:      Height:        Intake/Output Summary (Last 24 hours) at 08/18/2020 1330 Last data filed at 08/18/2020 1318 Gross per 24 hour  Intake 5550.6 ml  Output 205 ml  Net 5345.6 ml   Filed Weights   08/14/2020 0947 08/15/20 0500 08/17/20 0433  Weight: 90.7 kg 90.9 kg 94.3 kg    Physical Exam    GEN: Critically ill.  Male on the ventilator. HEENT: normal.  Neck: Supple, no JVD, carotid bruits, or masses. Cardiac: Irregular regular rhythm Respiratory: Breathing with ventilator GI: Soft, nontender, nondistended, BS + x 4. MS: no deformity or atrophy. Skin: warm and dry, no rash. Neuro: Intubated and sedated.  Labs    CBC Recent Labs    08/17/20 0835 08/18/20 0429  WBC 22.9* 16.8*  HGB 13.9 11.6*  HCT 41.0 33.5*  MCV 88.2 86.8  PLT 188 751*   Basic Metabolic Panel Recent Labs    08/17/20 0439 08/18/20 0429  NA 143 140  K 4.3 4.7  CL 111 108  CO2 19* 16*  GLUCOSE 229* 210*  BUN 99* 118*  CREATININE 5.23* 6.13*  CALCIUM 5.9* 6.1*  MG 2.0 2.2  PHOS 5.9* 6.6*   Liver Function Tests Recent Labs    08/17/20 0439 08/18/20 0429  ALBUMIN 1.8* 1.7*   No results for input(s): LIPASE, AMYLASE in the last 72 hours. Cardiac Enzymes Recent Labs    08/16/20 1339  CKMB 28.3*   BNP No results for input(s): BNP in the last 72 hours. D-Dimer No results for input(s): DDIMER in the last 72 hours. Hemoglobin A1C No results for input(s): HGBA1C in the last 72 hours. Fasting Lipid Panel No results for input(s): CHOL, HDL,  LDLCALC, TRIG, CHOLHDL, LDLDIRECT in the last 72 hours. Thyroid Function Tests Recent Labs    08/17/20 0439  TSH 0.064*    Telemetry    Atrial fibrillation with rapid ventricular response  ECG       Radiology    DG Abd 1 View  Result Date: 08/18/2020 CLINICAL DATA:  Gastroenteritis. EXAM: ABDOMEN - 1 VIEW COMPARISON:  08/05/2020 FINDINGS: Nasogastric tube is present with tip over the stomach in the left upper quadrant and side-port in the region of the gastroesophageal junction. This could be advanced another 6-8 cm. Bowel gas pattern is nonobstructive. There are a few small calcifications over the kidneys bilaterally likely nephrolithiasis. Catheter tip is seen from below over the right pelvis with tip adjacent the level of the right inferior sacroiliac joint likely femoral venous catheter. IMPRESSION: 1. Nonobstructive bowel gas pattern. 2. Bilateral nephrolithiasis. 3. Nasogastric tube with tip over the stomach in the left upper quadrant and side-port in the region of the gastroesophageal junction. Electronically Signed   By: Marin Olp M.D.   On: 08/18/2020 09:53   DG Abdomen 1 View  Result Date: 08/31/2020 CLINICAL DATA:  Nasogastric tube placement. EXAM: ABDOMEN - 1 VIEW COMPARISON:  Abdominal radiographs 10/09/2016. Abdominal CT 06/15/2018. FINDINGS: 1353 hours. Enteric tube projects below the diaphragm, with tip overlying the gastric fundal region. Side hole is near the gastroesophageal junction. The visualized bowel gas pattern is normal. There are degenerative changes within the thoracolumbar spine. IMPRESSION: Enteric tube projects to the level of the proximal stomach. Electronically Signed   By: Richardean Sale M.D.   On: 08/07/2020 14:07   CT Head Wo Contrast  Result Date: 08/04/2020 CLINICAL DATA:  Change in mental status EXAM: CT HEAD WITHOUT CONTRAST TECHNIQUE: Contiguous axial images were obtained from the base of the skull through the vertex without intravenous  contrast. COMPARISON:  None. FINDINGS: Brain: No evidence of acute infarction, hemorrhage, hydrocephalus, extra-axial collection or mass lesion/mass effect. Vascular: No hyperdense vessel or unexpected calcification. Skull: Normal. Negative for fracture or focal lesion. Sinuses/Orbits: No acute finding.  Left cataract resection. Other: Pervasive motion artifact to a moderate degree. IMPRESSION: Negative motion degraded head CT. Electronically Signed   By: Monte Fantasia M.D.   On: 08/21/2020 11:11   US RENAL  Result Date: 08/14/2020 CLINICAL DATA:  Acute renal failure EXAM: RENAL / URINARY TRACT ULTRASOUND COMPLETE COMPARISON:  Ultrasound kidneys 04/13/2019 MRI abdomen 07/06/2020 FINDINGS: Right Kidney: Renal measurements: 11.0 x 6.9 x 6.1 = volume: 243 mL. 2.3 cm hypoechoic structure in the anterior right kidney likely a simple cyst given appearance on recent MRI. Echogenicity within  normal limits. No suspicious mass or hydronephrosis visualized. Left Kidney: Renal measurements: 11.5 x 6.7 x 1.5 = volume: 302 mL. Echogenicity within normal limits. No mass or hydronephrosis visualized. 12 mm linear echogenicity in the lower pole suspicious for nonobstructing calculus. Bladder: Unable to evaluate as it is collapsed around a Foley catheter balloon. Other: None. IMPRESSION: 1. No hydronephrosis. 2. Nonobstructing left renal calculus. Electronically Signed   By: Miachel Roux M.D.   On: 08/14/2020 08:16   US Venous Img Lower Bilateral (DVT)  Result Date: 08/17/2020 CLINICAL DATA:  Lower extremity edema EXAM: BILATERAL LOWER EXTREMITY VENOUS DOPPLER ULTRASOUND BILATERAL LOWER EXTREMITY VENOUS DOPPLER ULTRASOUND TECHNIQUE: Gray-scale sonography with graded compression, as well as color Doppler and duplex ultrasound were performed to evaluate the lower extremity deep venous systems from the level of the common femoral vein and including the common femoral, femoral, profunda femoral, popliteal and calf veins  including the posterior tibial, peroneal and gastrocnemius veins when visible. The superficial great saphenous vein was also interrogated. Spectral Doppler was utilized to evaluate flow at rest and with distal augmentation maneuvers in the common femoral, femoral and popliteal veins. COMPARISON:  None. FINDINGS: RIGHT LOWER EXTREMITY Common Femoral Vein: No evidence of thrombus. Normal compressibility, respiratory phasicity and response to augmentation. Saphenofemoral Junction: No evidence of thrombus. Normal compressibility and flow on color Doppler imaging. Profunda Femoral Vein: No evidence of thrombus. Normal compressibility and flow on color Doppler imaging. Femoral Vein: No evidence of thrombus. Normal compressibility, respiratory phasicity and response to augmentation. Popliteal Vein: No evidence of thrombus. Normal compressibility, respiratory phasicity and response to augmentation. Calf Veins: No evidence of thrombus. Normal compressibility and flow on color Doppler imaging. Limited visualization of peroneal veins. Superficial Great Saphenous Vein: No evidence of thrombus. Normal compressibility. Other Findings:  None. LEFT LOWER EXTREMITY Common Femoral Vein: No evidence of thrombus. Normal compressibility, respiratory phasicity and response to augmentation. Saphenofemoral Junction: No evidence of thrombus. Normal compressibility and flow on color Doppler imaging. Profunda Femoral Vein: No evidence of thrombus. Normal compressibility and flow on color Doppler imaging. Femoral Vein: No evidence of thrombus. Normal compressibility, respiratory phasicity and response to augmentation. Popliteal Vein: No evidence of thrombus. Normal compressibility, respiratory phasicity and response to augmentation. Calf Veins: No evidence of thrombus. Normal compressibility and flow on color Doppler imaging.Limited visualization of peroneal veins. Superficial Great Saphenous Vein: No evidence of thrombus. Normal  compressibility. Other Findings:  None. IMPRESSION: No evidence of deep venous thrombosis in either lower extremity. Electronically Signed   By: Miachel Roux M.D.   On: 08/17/2020 10:08   DG Chest Port 1 View  Result Date: 08/18/2020 CLINICAL DATA:  Endotracheal tube EXAM: PORTABLE CHEST 1 VIEW COMPARISON:  August 16, 2020 FINDINGS: The endotracheal tube terminates above the carina. The dialysis catheter is stable in positioning. The enteric tube extends below the left hemidiaphragm. Hazy airspace opacities are noted bilaterally which have worsened since the prior study, especially in the left lower lobe. There is no pneumothorax. There may be small bilateral pleural effusions. IMPRESSION: 1. Lines and tubes as above. 2. Worsening bilateral hazy airspace opacities, especially in the left lower lobe. Electronically Signed   By: Constance Holster M.D.   On: 08/18/2020 06:03   DG Chest Port 1 View  Result Date: 08/16/2020 CLINICAL DATA:  Central line placement EXAM: PORTABLE CHEST 1 VIEW COMPARISON:  08/15/2020 FINDINGS: Interval placement of left neck vascular catheter, tip positioned over the superior SVC. Otherwise unchanged AP portable chest radiograph, support apparatus  including endotracheal tube and esophagogastric tube, with patchy bilateral heterogeneous airspace opacity. The heart and mediastinum are unremarkable. IMPRESSION: 1. Interval placement of left neck vascular catheter, tip positioned over the superior SVC. 2. Otherwise unchanged AP portable chest radiograph, support apparatus including endotracheal tube and esophagogastric tube, with patchy bilateral heterogeneous airspace opacity. Electronically Signed   By: Eddie Candle M.D.   On: 08/16/2020 10:10   DG Chest Port 1 View  Result Date: 08/15/2020 CLINICAL DATA:  Acute respiratory failure with hypoxia EXAM: PORTABLE CHEST 1 VIEW COMPARISON:  08/14/2020 FINDINGS: Endotracheal tube and NG tube are unchanged. Bibasilar atelectasis or  infiltrates. No effusions. Heart is normal size. No acute bony abnormality. IMPRESSION: Bibasilar atelectasis or infiltrates. Electronically Signed   By: Rolm Baptise M.D.   On: 08/15/2020 03:01   DG Chest Port 1 View  Result Date: 08/14/2020 CLINICAL DATA:  Endotracheal tube placement EXAM: PORTABLE CHEST 1 VIEW COMPARISON:  August 13, 2020 FINDINGS: The endotracheal tube terminates approximately 6 cm above the carina. The enteric tube extends below the left hemidiaphragm. There is no definite pneumothorax or large pleural effusion. There are increasing airspace opacities at the left lung base and in the retrocardiac region. The heart size is stable. Aortic calcifications are noted. IMPRESSION: 1. Lines and tubes as above. 2. Increasing airspace opacities at the left lung base and within the retrocardiac region suspicious for atelectasis or developing pneumonia. Electronically Signed   By: Constance Holster M.D.   On: 08/14/2020 00:24   DG Chest Port 1 View  Result Date: 08/12/2020 CLINICAL DATA:  Attempted central line placement. Assess for pneumothorax EXAM: PORTABLE CHEST 1 VIEW COMPARISON:  08/29/2020 FINDINGS: Endotracheal tube is 7.6 cm above the carina. NG tube is in the stomach. No visible pneumothorax. Heart is normal size. Bibasilar airspace opacities. No effusions. IMPRESSION: No visible pneumothorax. Bibasilar atelectasis or infiltrates. Electronically Signed   By: Rolm Baptise M.D.   On: 09/02/2020 21:51   DG Chest Portable 1 View  Result Date: 08/19/2020 CLINICAL DATA:  Hypoxia.  Reported nasogastric tube placement EXAM: PORTABLE CHEST 1 VIEW COMPARISON:  August 13, 2020 study obtained earlier in the day FINDINGS: A nasogastric tube is not appreciable on current examination. Endotracheal tube tip is 6.8 cm above the carina. No pneumothorax. Lungs are clear. Heart size and pulmonary vascularity are normal. No adenopathy. There is degenerative change in the thoracic spine. IMPRESSION:  Endotracheal tube as described without pneumothorax. No nasogastric tube apparent. Lungs clear. Cardiac silhouette normal. Electronically Signed   By: Lowella Grip III M.D.   On: 08/16/2020 12:59   DG Chest Portable 1 View  Result Date: 08/12/2020 CLINICAL DATA:  Chest pain shortness of breath. Recent COVID-19 diagnosis. EXAM: PORTABLE CHEST 1 VIEW COMPARISON:  03/03/2011 FINDINGS: Midline trachea. Borderline cardiomegaly. No pleural effusion or pneumothorax. Suspect mild left base scarring. Clear right lung. IMPRESSION: Borderline cardiomegaly, without acute disease. Electronically Signed   By: Abigail Miyamoto M.D.   On: 08/21/2020 10:24    Assessment & Plan     Atrial fibrillation-patient developed A. fib with RVR.  Etiology is likely multifactorial to include COVID-pneumonia, recent DKA today, acute renal injury.  Difficult to control rate with amiodarone.  Noted to have reduced TSH at 0.064.  T4 T3 pending.  Currently on heparin and amiodarone drip.  Has received 3 boluses of 150 mg.  Received a 0.130 mg bolus of digoxin.  Rate is improved.  We will continue with IV amiodarone and reserve digoxin for  as needed boluses.  Continue with heparin.  Respiratory failure-remains intubated.  Plans per ICU team  Acute kidney injury-creatinine increased to 6.1 today.  Baseline appears to have been normal.  Appreciate nephrology input.  Signed, Javier Docker Reinhardt Licausi MD 08/18/2020, 1:30 PM  Pager: [phone]

## 2020-08-19 ENCOUNTER — Inpatient Hospital Stay: Payer: HMO

## 2020-08-19 LAB — BASIC METABOLIC PANEL
Anion gap: 19 — ABNORMAL HIGH (ref 5–15)
BUN: 142 mg/dL — ABNORMAL HIGH (ref 8–23)
CO2: 22 mmol/L (ref 22–32)
Calcium: 6.2 mg/dL — CL (ref 8.9–10.3)
Chloride: 100 mmol/L (ref 98–111)
Creatinine, Ser: 5.82 mg/dL — ABNORMAL HIGH (ref 0.61–1.24)
GFR, Estimated: 10 mL/min — ABNORMAL LOW (ref >60)
Glucose, Bld: 154 mg/dL — ABNORMAL HIGH (ref 70–99)
Potassium: 4.9 mmol/L (ref 3.5–5.1)
Sodium: 141 mmol/L (ref 135–145)

## 2020-08-19 LAB — BLOOD GAS, ARTERIAL
Acid-base deficit: 0.2 mmol/L (ref 0.0–2.0)
Bicarbonate: 24.8 mmol/L (ref 20.0–28.0)
FIO2: 1
MECHVT: 520 mL
O2 Saturation: 97.5 %
PEEP: 10 cmH2O
Patient temperature: 37
RATE: 22 resp/min
pCO2 arterial: 41 mmHg (ref 32.0–48.0)
pH, Arterial: 7.39 (ref 7.350–7.450)
pO2, Arterial: 97 mmHg (ref 83.0–108.0)

## 2020-08-19 LAB — CBC
HCT: 32.1 % — ABNORMAL LOW (ref 39.0–52.0)
Hemoglobin: 11.4 g/dL — ABNORMAL LOW (ref 13.0–17.0)
MCH: 29.9 pg (ref 26.0–34.0)
MCHC: 35.5 g/dL (ref 30.0–36.0)
MCV: 84.3 fL (ref 80.0–100.0)
Platelets: 119 10*3/uL — ABNORMAL LOW (ref 150–400)
RBC: 3.81 MIL/uL — ABNORMAL LOW (ref 4.22–5.81)
RDW: 14.9 % (ref 11.5–15.5)
WBC: 21.2 10*3/uL — ABNORMAL HIGH (ref 4.0–10.5)
nRBC: 0 % (ref 0.0–0.2)

## 2020-08-19 LAB — RENAL FUNCTION PANEL
Albumin: 1.7 g/dL — ABNORMAL LOW (ref 3.5–5.0)
Anion gap: 16 — ABNORMAL HIGH (ref 5–15)
BUN: 106 mg/dL — ABNORMAL HIGH (ref 8–23)
CO2: 24 mmol/L (ref 22–32)
Calcium: 6.2 mg/dL — CL (ref 8.9–10.3)
Chloride: 102 mmol/L (ref 98–111)
Creatinine, Ser: 5.28 mg/dL — ABNORMAL HIGH (ref 0.61–1.24)
GFR, Estimated: 11 mL/min — ABNORMAL LOW (ref >60)
Glucose, Bld: 122 mg/dL — ABNORMAL HIGH (ref 70–99)
Phosphorus: 5.2 mg/dL — ABNORMAL HIGH (ref 2.5–4.6)
Potassium: 4.1 mmol/L (ref 3.5–5.1)
Sodium: 142 mmol/L (ref 135–145)

## 2020-08-19 LAB — GLUCOSE, CAPILLARY
Glucose-Capillary: 120 mg/dL — ABNORMAL HIGH (ref 70–99)
Glucose-Capillary: 161 mg/dL — ABNORMAL HIGH (ref 70–99)
Glucose-Capillary: 176 mg/dL — ABNORMAL HIGH (ref 70–99)
Glucose-Capillary: 196 mg/dL — ABNORMAL HIGH (ref 70–99)
Glucose-Capillary: 153 mg/dL — ABNORMAL HIGH (ref 70–99)

## 2020-08-19 LAB — MAGNESIUM: Magnesium: 1.9 mg/dL (ref 1.7–2.4)

## 2020-08-19 LAB — HEPARIN LEVEL (UNFRACTIONATED)
Heparin Unfractionated: 0.28 IU/mL — ABNORMAL LOW (ref 0.30–0.70)
Heparin Unfractionated: 0.36 IU/mL (ref 0.30–0.70)
Heparin Unfractionated: 0.33 IU/mL (ref 0.30–0.70)

## 2020-08-19 LAB — C-REACTIVE PROTEIN: CRP: 18 mg/dL — ABNORMAL HIGH (ref <1.0)

## 2020-08-19 LAB — FIBRIN DERIVATIVES D-DIMER (ARMC ONLY): Fibrin derivatives D-dimer (ARMC): 4038.24 ng/mL (FEU) — ABNORMAL HIGH (ref 0.00–499.00)

## 2020-08-19 LAB — FERRITIN: Ferritin: 842 ng/mL — ABNORMAL HIGH (ref 24–336)

## 2020-08-19 IMAGING — DX DG CHEST 1V PORT
1 series · 1 of 1 positions shown · non-contrast
Comparison: Portable chest [DATE] and earlier.

CLINICAL DATA: 69-year-old male [XT].  Intubated.

EXAM:
PORTABLE CHEST 1 VIEW

[chest ap]
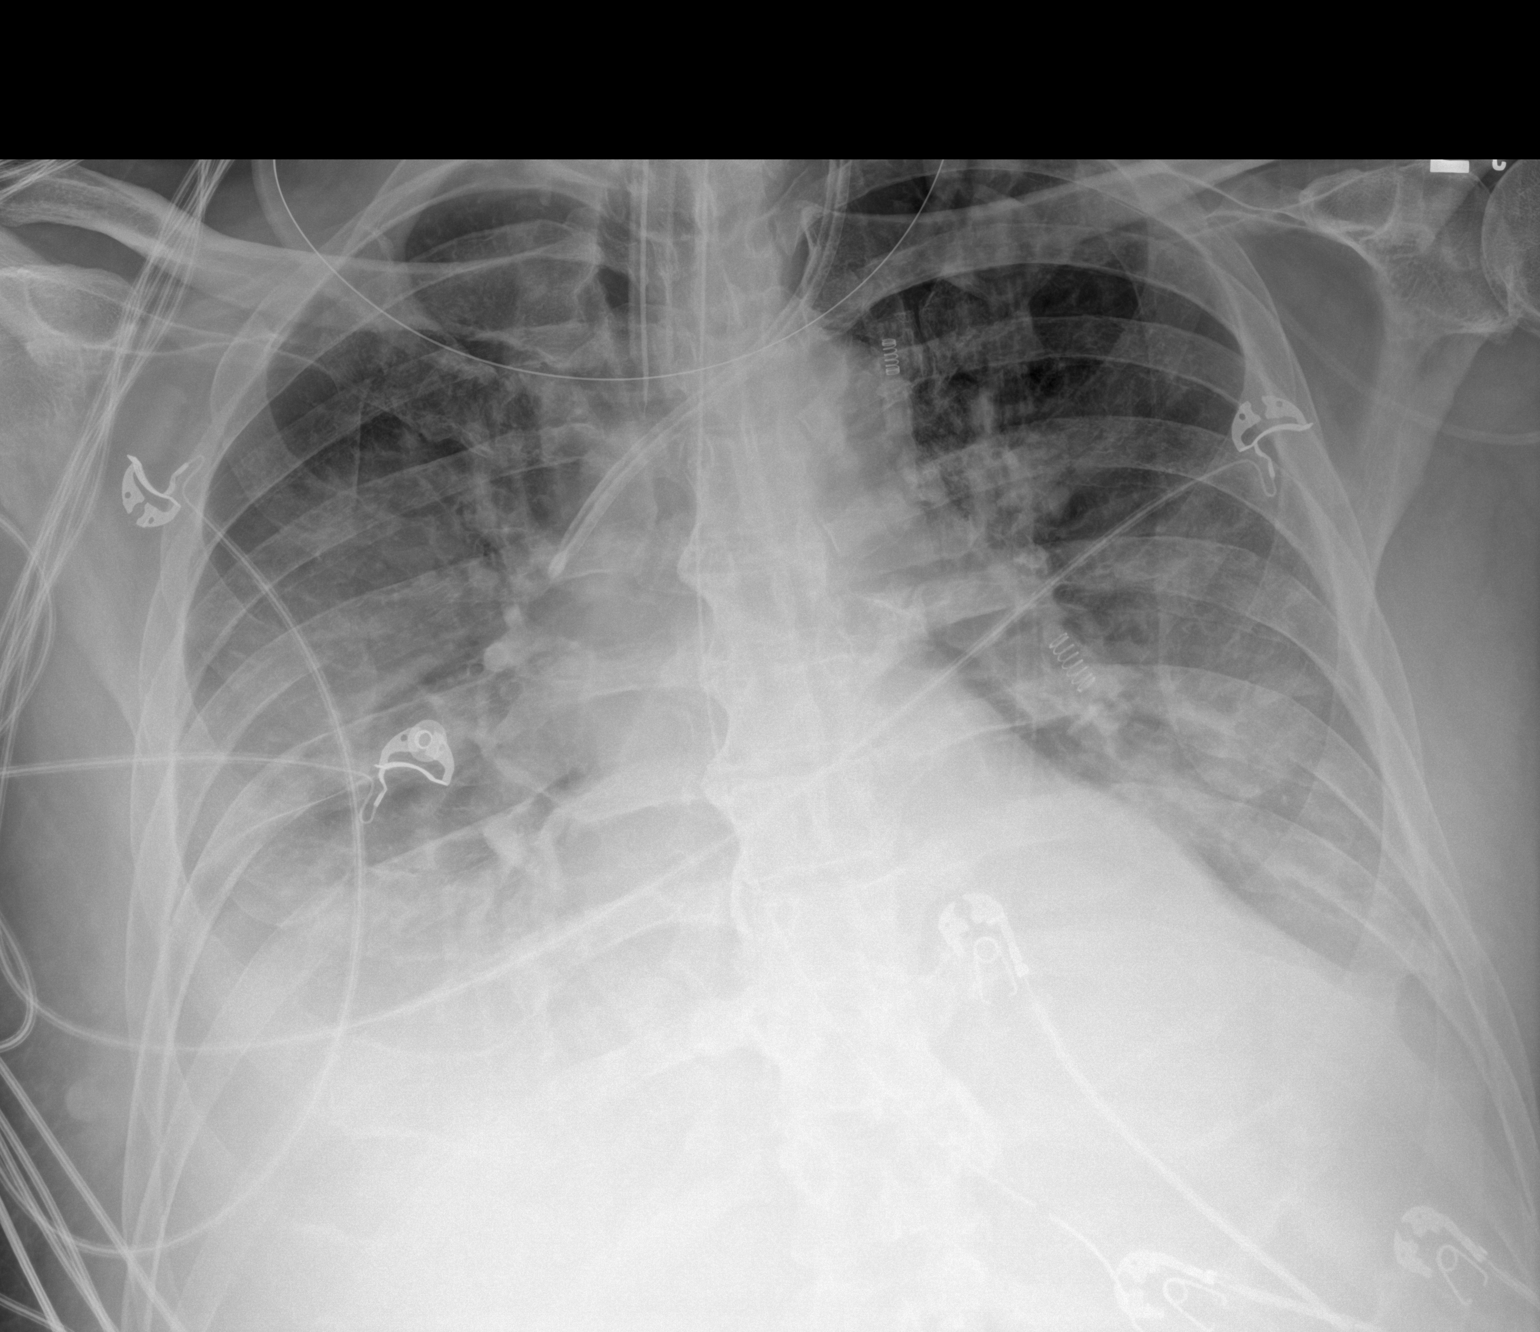

[1 of 1 positions shown; findings below may reference images not displayed]

FINDINGS: Portable AP upright view at [XT] hours. Stable lines and tubes. Left
IJ dual lumen catheter again noted. Increased veiling opacity in the
right lower lung and increased dense retrocardiac opacity obscuring
some of the left hemidiaphragm. Elsewhere coarse and indistinct
pulmonary opacity is stable. No pneumothorax identified. Mediastinal
contours remain normal. Paucity of bowel gas in the upper abdomen.
IMPRESSION: 1. Evidence of small to moderate new right pleural effusion,
increasing bilateral lower lobe collapse or consolidation.
Underlying bilateral [XT] pneumonia suspected.

2. Stable lines and tubes.

## 2020-08-19 MED ORDER — MIDAZOLAM HCL 2 MG/2ML IJ SOLN: 2.0000 mg | Freq: Once | INTRAMUSCULAR | Status: AC

## 2020-08-19 MED ORDER — VECURONIUM BROMIDE 10 MG IV SOLR
10.0000 mg | INTRAVENOUS | Status: DC | PRN
  Administered 2020-08-19: 10 mg via INTRAVENOUS
  Filled 2020-08-19: qty 10

## 2020-08-19 MED ORDER — SODIUM BICARBONATE 8.4 % IV SOLN: 100.0000 meq | Freq: Once | INTRAVENOUS | Status: AC

## 2020-08-19 MED ORDER — MIDAZOLAM HCL 2 MG/2ML IJ SOLN
INTRAMUSCULAR | Status: AC
  Administered 2020-08-19: 2 mg via INTRAVENOUS
  Filled 2020-08-19: qty 2

## 2020-08-19 MED ORDER — VECURONIUM BROMIDE 10 MG IV SOLR
INTRAVENOUS | Status: AC
  Administered 2020-08-19: 10 mg via INTRAVENOUS
  Filled 2020-08-19: qty 10

## 2020-08-19 MED ORDER — MIDAZOLAM HCL 2 MG/2ML IJ SOLN
2.0000 mg | INTRAMUSCULAR | Status: DC | PRN
  Administered 2020-08-20 (×3): 4 mg via INTRAVENOUS
  Administered 2020-08-20: 2 mg via INTRAVENOUS
  Administered 2020-08-20 (×2): 4 mg via INTRAVENOUS
  Administered 2020-08-20: 2 mg via INTRAVENOUS
  Filled 2020-08-19 (×3): qty 4
  Filled 2020-08-19 (×2): qty 2
  Filled 2020-08-19 (×2): qty 4

## 2020-08-19 MED ORDER — SODIUM BICARBONATE 8.4 % IV SOLN
INTRAVENOUS | Status: AC
  Administered 2020-08-19: 100 meq via INTRAVENOUS
  Filled 2020-08-19: qty 100

## 2020-08-19 MED ORDER — AMIODARONE HCL IN DEXTROSE 360-4.14 MG/200ML-% IV SOLN
60.0000 mg/h | INTRAVENOUS | Status: AC
  Administered 2020-08-19 (×2): 60 mg/h via INTRAVENOUS
  Filled 2020-08-19: qty 200

## 2020-08-19 MED ORDER — CALCIUM GLUCONATE-NACL 1-0.675 GM/50ML-% IV SOLN
1.0000 g | Freq: Once | INTRAVENOUS | Status: AC
  Administered 2020-08-19: 1000 mg via INTRAVENOUS
  Filled 2020-08-19: qty 50

## 2020-08-19 NOTE — Progress Notes (Signed)
Pharmacy Antibiotic Note  Nathaniel Thomas is a 70 y.o. male admitted on 08/17/2020 with sepsis in setting of COVID-19 infection. There is concern for superimposed bacterial infection. Patient further with severe DKA. Patient was intubated 1/10 and remains sedated on mechanical ventilation. He is hypotensive requiring blood pressure support with Levophed. Pharmacy has been consulted for cefepime dosing. -off CRRT- now on HD,  ?HD 1/15  Plan: Day 6- Cefepime 2 g IV q24h. Hemodialysis dosing. Schedule in evening after HD on HD days WBC 16.8>21.2, T 99   Height: 6\' 4"  (193 cm) Weight: 94.3 kg (207 lb 14.3 oz) IBW/kg (Calculated) : 86.8  Temp (24hrs), Avg:99 F (37.2 C), Min:98.4 F (36.9 C), Max:99.32 F (37.4 C)  Recent Labs  Lab 08/18/2020 0950 09/03/2020 1136 08/12/2020 1615 08/15/20 0458 08/15/20 0831 08/16/20 0416 08/16/20 1513 08/17/20 0439 08/17/20 0835 08/18/20 0429 08/19/20 0302 08/19/20 0315  WBC 23.2*  --    < > 27.9*  --  24.9*  --   --  22.9* 16.8* 21.2*  --   CREATININE 2.01*  --    < >  --    < > 5.74* 6.12* 5.23*  --  6.13*  --  5.28*  LATICACIDVEN 4.4* 3.8*  --   --   --   --   --   --   --   --   --   --    < > = values in this interval not displayed.    Estimated Creatinine Clearance: 16 mL/min (A) (by C-G formula based on SCr of 5.28 mg/dL (H)).    Allergies  Allergen Reactions  . Demerol Other (See Comments)    Blood pressure dropped completely out  . Meperidine Other (See Comments)    Drops blood pressure out  Blood pressure dropped completely out  . Meperidine Hcl Other (See Comments)    Drops blood pressure out  . Nitrospan [Nitroglycerin] Other (See Comments)    Blood pressure dropped out  . Sulfa Antibiotics Other (See Comments)    Urinary burning; blisters   . Sulfasalazine Other (See Comments)    Urinary burning; blisters     Antimicrobials this admission: Vancomycin 1/10 x 1  Cefepime 1/10 >>    Microbiology results: 1/10 BCx:  NGTD 1/10 UCx: no growth    Thank you for allowing pharmacy to be a part of this patient's care.  Keaden Gunnoe A, PharmD 08/19/2020 1:56 PM

## 2020-08-19 NOTE — Progress Notes (Signed)
2203-Critical Calcium level of 6.2 received from lab, [facility], and reported to Domingo Pulse NP. Also discussed patients guppy breathing, one time Versed IV order received and carried out.

## 2020-08-19 NOTE — Progress Notes (Signed)
Desaturated at [facility] notified Domingo Pulse and obtained orders for Bicarb and Vecuronium increased FIO2. O2 saturation came back up and patient calmed down

## 2020-08-19 NOTE — Progress Notes (Signed)
[address] [place], [place] [address] Phone [phone]. Fax [phone]  Date: 08/19/2020                  Patient Name:  Nathaniel Thomas  MRN: [id number]  DOB: [birth year]  Age / Sex: 70 y.o., male         PCP: Dion Body, MD                  Presenting Illness: Patient is a 70 y.o. male  admitted to [facility] on 08/21/2020 for evaluation of Metabolic acidosis [K27.0] Encounter for central line placement [Z45.2] Acute renal failure (ARF) ([place]) [N17.9] Acute respiratory failure with hypoxia ([place]) [J96.01] Sepsis, due to unspecified organism, unspecified whether acute organ dysfunction present ([place]) [A41.9] COVID-19 [U07.1]   Patient presented to the ER via EMS from home for altered mental status.  Recently treated for COVID.  Per triage notes, patient was prescribed antibiotics for UTI but is unable to take due to lethargy.  Upon arrival patient was responsive to painful stimuli. Also reported to have nausea vomiting and diarrhea for several days prior to admission.  Initial evaluation showed severe acidosis with elevated blood sugars.  Also noted to have hypothermia.  He was intubated for severe acute respiratory failure.  Initially on presentation, pH of 6.99. Patient was treated with ivermectin as outpatient for COVID.  He is unvaccinated.  Hospital course:  1/13- Vascath placed. Plan for HD today. TF _0  cc/min. Sedated. No pressors 1/14- now on amiodarone, vasopressin. Ketamine, fentanyl, phenylephrine. UOP remains poor.  1/15-  Remains critically ill.  Still on the vent. Due for another dialysis session today. Poor urine output.  1/16-patient underwent hemodialysis yesterday.  Remains critically ill.  Still on the ventilator.  Remains oliguric.  01/15 0701 - 01/16 0700 In: 1996.8 [I.V.:1413.9; NG/GT:509.3; IV Piggyback:73.6] Out: 200 [Urine:200]    Vital Signs: Blood pressure 126/61, pulse 91, temperature 99 F (37.2 C), resp. rate (!) 26, height 6' 4"  (1.93 m), weight 94.3 kg, SpO2 97 %.   Intake/Output Summary (Last 24 hours) at 08/19/2020 1419 Last data filed at 08/19/2020 1200 Gross per 24 hour  Intake 886.23 ml  Output 195 ml  Net 691.23 ml    Weight trends: Filed Weights   08/24/2020 0947 08/15/20 0500 08/17/20 0433  Weight: 90.7 kg 90.9 kg 94.3 kg    Physical Exam: General:  Critically ill-appearing  HEENT  ET tube in place  Lungs:  Ventilator assisted, FiO2 80%  Heart::  Tachycardic, irregular  Abdomen:  Nondistended  Extremities:  Trace edema  Neurologic:  Sedated  Skin:  Warm, dry  Access:  Left IJ temp Cath 1/13- ICU team  Foley:  In place       Lab results: Basic Metabolic Panel: Recent Labs  Lab 08/17/20 0439 08/18/20 0429 08/19/20 0302 08/19/20 0315  NA 143 140  --  142  K 4.3 4.7  --  4.1  CL 111 108  --  102  CO2 19* 16*  --  24  GLUCOSE 229* 210*  --  122*  BUN 99* 118*  --  106*  CREATININE 5.23* 6.13*  --  5.28*  CALCIUM 5.9* 6.1*  --  6.2*  MG 2.0 2.2 1.9  --   PHOS 5.9* 6.6*  --  5.2*    Liver Function Tests: Recent Labs  Lab 08/29/2020 0950 08/16/20 0416 08/19/20 0315  AST 32  --   --   ALT 22  --   --  ALKPHOS 72  --   --   BILITOT 1.9*  --   --   PROT 8.1  --   --   ALBUMIN 3.6   < > 1.7*   < > = values in this interval not displayed.   No results for input(s): LIPASE, AMYLASE in the last 168 hours. No results for input(s): AMMONIA in the last 168 hours.  CBC: Recent Labs  Lab 08/12/2020 0950 08/05/2020 1615 08/18/20 0429 08/19/20 0302  WBC 23.2*   < > 16.8* 21.2*  NEUTROABS 16.1*  --   --   --   HGB 16.4   < > 11.6* 11.4*  HCT 53.9*   < > 33.5* 32.1*  MCV 97.3   < > 86.8 84.3  PLT 514*   < > 123* 119*   < > = values in this interval not displayed.    Cardiac Enzymes: No results for input(s): CKTOTAL, TROPONINI in the last 168 hours.  BNP: Invalid input(s): POCBNP  CBG: Recent Labs  Lab 08/18/20 2003 08/18/20 2344 08/19/20 0354 08/19/20 0730  08/19/20 1117  GLUCAP 73 89 120* 161* 176*    Microbiology: Recent Results (from the past 720 hour(s))  Blood Culture (routine x 2)     Status: None   Collection Time: 08/18/2020  9:50 AM   Specimen: BLOOD  Result Value Ref Range Status   Specimen Description BLOOD LEFT AC  Final   Special Requests   Final    BOTTLES DRAWN AEROBIC AND ANAEROBIC Blood Culture adequate volume   Culture   Final    NO GROWTH 5 DAYS Performed at [facility], [address] [place], [place] [address]    Report Status 08/18/2020 FINAL  Final  Blood Culture (routine x 2)     Status: None   Collection Time: 08/27/2020  9:50 AM   Specimen: BLOOD  Result Value Ref Range Status   Specimen Description BLOOD RIGHT AC  Final   Special Requests   Final    BOTTLES DRAWN AEROBIC AND ANAEROBIC Blood Culture results may not be optimal due to an excessive volume of blood received in culture bottles   Culture   Final    NO GROWTH 5 DAYS Performed at [facility], [address], [place], [place] [address]    Report Status 08/18/2020 FINAL  Final  Urine culture     Status: None   Collection Time: 08/08/2020 11:36 AM   Specimen: Urine, Random  Result Value Ref Range Status   Specimen Description   Final    URINE, RANDOM Performed at [facility] [address] [place], [place] [address]    Special Requests   Final    NONE Performed at [facility], [address], [place], [place] [address]    Culture   Final    NO GROWTH Performed at [facility], [place] [address], [place], [place] [address]    Report Status 08/14/2020 FINAL  Final  Culture, respiratory     Status: None (Preliminary result)   Collection Time: 08/17/20  3:15 PM   Specimen: Tracheal Aspirate; Respiratory  Result Value Ref Range Status   Specimen Description   Final    TRACHEAL ASPIRATE Performed at [facility], [address], [place], [place] [address]    Special Requests   Final     NONE Performed at [facility], [address] [place],  [address]    Gram Stain  Final    NO WBC SEEN MODERATE GRAM VARIABLE ROD Performed at [facility], [place] [address] [place], [place] [address]    Culture ABUNDANT SERRATIA MARCESCENS  Final   Report Status PENDING  Incomplete  Culture, respiratory     Status: None (Preliminary result)   Collection Time: 08/18/20 11:09 AM   Specimen: Tracheal Aspirate; Respiratory  Result Value Ref Range Status   Specimen Description   Final    TRACHEAL ASPIRATE Performed at [facility], [address], [place], [place] [address]    Special Requests   Final    NONE Performed at [facility], [place], [place] [address]    Gram Stain   Final    MODERATE WBC PRESENT, PREDOMINANTLY PMN MODERATE GRAM NEGATIVE RODS FEW GRAM VARIABLE ROD RARE GRAM POSITIVE COCCI    Culture   Final    ABUNDANT SERRATIA MARCESCENS SUSCEPTIBILITIES TO FOLLOW Performed at [facility], [place] [address] [place], [place] [address]    Report Status PENDING  Incomplete  MRSA PCR Screening     Status: None   Collection Time: 08/18/20 12:52 PM   Specimen: Nasopharyngeal  Result Value Ref Range Status   MRSA by PCR NEGATIVE NEGATIVE Final    Comment:        The GeneXpert MRSA Assay (FDA approved for NASAL specimens only), is one component of a comprehensive MRSA colonization surveillance program. It is not intended to diagnose MRSA infection nor to guide or monitor treatment for MRSA infections. Performed at [facility], [address] [place], [place] [address]      Coagulation Studies: No results for input(s): LABPROT, INR in the last 72 hours.  Urinalysis: No results for input(s): COLORURINE, LABSPEC, PHURINE, GLUCOSEU, HGBUR, BILIRUBINUR, KETONESUR, PROTEINUR, UROBILINOGEN, NITRITE, LEUKOCYTESUR in the last 72 hours.  Invalid input(s): APPERANCEUR      Imaging: DG  Abd 1 View  Result Date: 08/18/2020 CLINICAL DATA:  Gastroenteritis. EXAM: ABDOMEN - 1 VIEW COMPARISON:  08/12/2020 FINDINGS: Nasogastric tube is present with tip over the stomach in the left upper quadrant and side-port in the region of the gastroesophageal junction. This could be advanced another 6-8 cm. Bowel gas pattern is nonobstructive. There are a few small calcifications over the kidneys bilaterally likely nephrolithiasis. Catheter tip is seen from below over the right pelvis with tip adjacent the level of the right inferior sacroiliac joint likely femoral venous catheter. IMPRESSION: 1. Nonobstructive bowel gas pattern. 2. Bilateral nephrolithiasis. 3. Nasogastric tube with tip over the stomach in the left upper quadrant and side-port in the region of the gastroesophageal junction. Electronically Signed   By: Marin Olp M.D.   On: 08/18/2020 09:53   DG Chest Port 1 View  Result Date: 08/19/2020 CLINICAL DATA:  70 year old male COVID-19.  Intubated. EXAM: PORTABLE CHEST 1 VIEW COMPARISON:  Portable chest 08/18/2020 and earlier. FINDINGS: Portable AP upright view at 0537 hours. Stable lines and tubes. Left IJ dual lumen catheter again noted. Increased veiling opacity in the right lower lung and increased dense retrocardiac opacity obscuring some of the left hemidiaphragm. Elsewhere coarse and indistinct pulmonary opacity is stable. No pneumothorax identified. Mediastinal contours remain normal. Paucity of bowel gas in the upper abdomen. IMPRESSION: 1. Evidence of small to moderate new right pleural effusion, increasing bilateral lower lobe collapse or consolidation. Underlying bilateral COVID-19 pneumonia suspected. 2. Stable lines and tubes. Electronically Signed   By: Genevie Ann M.D.   On: 08/19/2020  07:43   DG Chest Port 1 View  Result Date: 08/18/2020 CLINICAL DATA:  Endotracheal tube EXAM: PORTABLE CHEST 1 VIEW COMPARISON:  August 16, 2020 FINDINGS: The endotracheal tube terminates above the  carina. The dialysis catheter is stable in positioning. The enteric tube extends below the left hemidiaphragm. Hazy airspace opacities are noted bilaterally which have worsened since the prior study, especially in the left lower lobe. There is no pneumothorax. There may be small bilateral pleural effusions. IMPRESSION: 1. Lines and tubes as above. 2. Worsening bilateral hazy airspace opacities, especially in the left lower lobe. Electronically Signed   By: Constance Holster M.D.   On: 08/18/2020 06:03   Scheduled Meds: . amiodarone  150 mg Intravenous Once  . chlorhexidine gluconate (MEDLINE KIT)  15 mL Mouth Rinse BID  . Chlorhexidine Gluconate Cloth  6 each Topical Daily  . docusate  100 mg Per Tube BID  . feeding supplement (PROSource TF)  90 mL Per Tube TID  . fentaNYL (SUBLIMAZE) injection  25 mcg Intravenous Once  . hydrocortisone sod succinate (SOLU-CORTEF) inj  50 mg Intravenous Q6H  . insulin aspart  0-20 Units Subcutaneous Q4H  . insulin aspart  6 Units Subcutaneous Q4H  . insulin detemir  53 Units Subcutaneous BID  . mouth rinse  15 mL Mouth Rinse 10 times per day  . metoprolol tartrate  12.5 mg Per NG tube BID  . sodium chloride flush  3 mL Intravenous Q12H   Continuous Infusions: . sodium chloride    . amiodarone 60 mg/hr (08/19/20 1148)  . ceFEPime (MAXIPIME) IV 2 g (08/18/20 1847)  . diltiazem (CARDIZEM) infusion 12.5 mg/hr (08/19/20 0521)  . famotidine (PEPCID) IV 20 mg (08/19/20 1026)  . feeding supplement (VITAL 1.5 CAL) 1,000 mL (08/19/20 0525)  . fentaNYL infusion INTRAVENOUS 300 mcg/hr (08/19/20 1311)  . heparin 1,300 Units/hr (08/19/20 1413)  . ketamine (KETALAR) Adult IV Infusion Stopped (08/19/20 0933)  . vasopressin Stopped (08/18/20 1302)   PRN Meds:.sodium chloride, acetaminophen (TYLENOL) oral liquid 160 mg/5 mL, docusate, fentaNYL, heparin, metoprolol tartrate, polyethylene glycol, sodium chloride flush, vecuronium   Assessment & Plan: Pt is a 70 y.o.    male with coronary artery disease, diabetes, glaucoma, history of kidney stones, hyperlipidemia, hypertension, left knee replacement, lithotripsy, basal cell carcinoma, was admitted on 6/81/2751 with Metabolic acidosis [Z00.1] Encounter for central line placement [Z45.2] Acute renal failure (ARF) ([place]) [N17.9] Acute respiratory failure with hypoxia ([place]) [J96.01] Sepsis, due to unspecified organism, unspecified whether acute organ dysfunction present ([place]) [A41.9] COVID-19 [U07.1]   #Acute kidney injury Baseline creatinine 1.0 from August 02, 2020 Presenting creatinine of 2.15 Lab Results  Component Value Date   CREATININE 5.28 (H) 08/19/2020   CREATININE 6.13 (H) 08/18/2020   CREATININE 5.23 (H) 08/17/2020     #Acute respiratory failure Requiring ventilator support.  Intubated 08/14/2020  #COVID-19 pneumonia Treated with ivermectin as outpatient  #Diabetes type 2, insulin dependent Treated with metformin, Jardiance (empagliflozin), Amaryl as outpatient Hemoglobin A1c 7.9% from June 26, 2020 Lab Results  Component Value Date   HGBA1C 7.5 08/10/2017    #Severe acidosis     Plan: Patient underwent hemodialysis yesterday for oliguric acute kidney injury.  Continues to have oliguria with urine output of 200 cc over the preceding 24 hours.  Creatinine still high at 5.28.  Neck schedule dialysis for Tuesday.  Patient still on the ventilator and requiring significant amount of FiO2.  Overall prognosis remains guarded.   LOS: 6 Munsoor Lateef 1/16/20222:19 PM  Note: This note was prepared with Dragon dictation. Any transcription errors are unintentional

## 2020-08-19 NOTE — Progress Notes (Signed)
ANTICOAGULATION CONSULT NOTE  Pharmacy Consult for heparin Indication: atrial fibrillation  Patient Measurements: Heparin Dosing Weight: 90 kg  Labs: Recent Labs    08/17/20 0835 08/17/20 1729 08/18/20 0429 08/19/20 0302 08/19/20 0315 08/19/20 1400 08/19/20 2101 08/19/20 2226  HGB 13.9  --  11.6* 11.4*  --   --   --   --   HCT 41.0  --  33.5* 32.1*  --   --   --   --   PLT 188  --  123* 119*  --   --   --   --   HEPARINUNFRC 0.54   < > 0.42 0.28*  --  0.36  --  0.33  CREATININE  --   --  6.13*  --  5.28*  --  5.82*  --    < > = values in this interval not displayed.    Estimated Creatinine Clearance: 14.5 mL/min (A) (by C-G formula based on SCr of 5.82 mg/dL (H)).   Medical History: Past Medical History:  Diagnosis Date  . CAD (coronary artery disease)   . Cataract   . CHICKENPOX, HX OF 08/30/2008   Qualifier: Diagnosis of  By: [name], [name]    . Chronic kidney disease   . Diabetes mellitus   . Glaucoma   . History of urinary calculi 08/30/2008   Qualifier: Diagnosis of  By: [name], Jacelyn Pi   . Hyperlipidemia   . Hypertension   . Renal calculus or stone   . Skin cancer of nose    ears and hands   Assessment: 70 year old male admitted with severe DKA, which has since resolved and patient now on SQ insulin regimen. Patient with worsening renal function, plan to start HD 1/13. Patient with new afib 1/13, started on amiodarone and heparin.  -1/15 at 0429 HL = 0.42, therapeutic x 3. CBC is worse today, PLTs trending down, monitor closely.   Will continue heparin infusion at current rate of 1200 units/hr -0116 0302 HL 0.28, slightly SUBtherapeutic.  CBC low but stable now.  Will increase Heparin infusion to 1300 units/hr    Goal of Therapy:  Heparin level 0.3-0.7 units/ml Monitor platelets by anticoagulation protocol: Yes   Plan:   1/16 @1400   HL 0.36, therapeutic.  Hgb 11.4  Plt 119.  Will continue Heparin infusion at 1300 units/hr and check confirmatory HL  in ~ 8 hrs.    Monitor CBC  1/16 @ 2226 HL 0.33, therapeutic x 2.  Will continue Heparin infusion at 1300 units/hr and recheck HL in am.  Monitor CBC  Hart Robinsons A 08/19/2020,11:39 PM

## 2020-08-19 NOTE — Progress Notes (Signed)
Ketamine weaned off per orders. Responsive to mouth care only. Dressing changed on bilateral buttocks. FIO2 weaned to 60% on ventilator. Patient guppy breathing all day. Dr.Aleskerov aware.

## 2020-08-19 NOTE — Progress Notes (Signed)
Attempted to contact daughter, Amy, no one answered the phone.

## 2020-08-19 NOTE — Progress Notes (Signed)
CRITICAL CARE NOTE   Brief History:   70 y.o.malewith PMH as noted below who presents with altered mental status after he was found unresponsive this morning.   Per EMS he was initially hypoxic although this appears to have resolved. The patient himself is unable to give any history.  Patient Dx with COVID 1st week of Jan 2022 +NVD for several days ER shows severe acidosis with elevated sugars Patient with severe hypothermia and intubated for severe resp failure  1/10 admitted to ICU, severe DKA, gastroentertitis 1/11 severe resp failure 1/12 severe DKA 1/13 severe resp failure, worsening Renal failure 1/14- Reviewed care plan with cardiologist Dr Ubaldo Glassing, plan for cardiac cardoversion. Patient remains in shock with AFRVR on MV, medically optimizing for procedure.  I met with daughter York Cerise.   1/15- patient improved slightly, now with rate controlled AF off Neo. Discussed case with cardiology.  08/19/20- vitals are improved, rate controlled.  BP normalized.  Ventilator weaned to FiO2 60% remains crtically ill.    CC  follow up respiratory failure  SUBJECTIVE Patient remains critically ill Prognosis is guarded   BP 119/67   Pulse 96   Temp 99.7 F (37.6 C)   Resp (!) 28   Ht 6' 4"  (1.93 m)   Wt 94.3 kg   SpO2 96%   BMI 25.31 kg/m    I/O last 3 completed shifts: In: 6413.8 [I.V.:4946; NG/GT:1294.3; IV Piggyback:173.6] Out: 255 [Urine:255] Total I/O In: 23 [I.V.:23] Out: 145 [Urine:145]  SpO2: 96 % FiO2 (%): 80 %  Estimated body mass index is 25.31 kg/m as calculated from the following:   Height as of this encounter: 6' 4"  (1.93 m).   Weight as of this encounter: 94.3 kg.  SIGNIFICANT EVENTS   REVIEW OF SYSTEMS  PATIENT IS UNABLE TO PROVIDE COMPLETE REVIEW OF SYSTEMS DUE TO SEVERE CRITICAL ILLNESS   Pressure Injury 08/15/20 Sacrum Medial;Lower Deep Tissue Pressure Injury - Purple or maroon localized area of discolored intact skin or blood-filled  blister due to damage of underlying soft tissue from pressure and/or shear. bruised looking non-blanchabl (Active)  08/15/20 1200  Location: Sacrum  Location Orientation: Medial;Lower  Staging: Deep Tissue Pressure Injury - Purple or maroon localized area of discolored intact skin or blood-filled blister due to damage of underlying soft tissue from pressure and/or shear.  Wound Description (Comments): bruised looking non-blanchable purple on soft tissue, Serous blisters bilaterally  Present on Admission:     PHYSICAL EXAMINATION: GENERAL:critically ill appearing, on MV NECK: Supple.  PULMONARY: +rhonchi, MV sounds CARDIOVASCULAR: S1 and S2.  GASTROINTESTINAL: Soft, nontender, +Positive bowel sounds.  MUSCULOSKELETAL: No swelling, clubbing, or edema.  NEUROLOGIC: obtunded, GCS4T SKIN:+sacral decub    MEDICATIONS: I have reviewed all medications and confirmed regimen as documented   CULTURE RESULTS   Recent Results (from the past 240 hour(s))  Blood Culture (routine x 2)     Status: None   Collection Time: 08/16/2020  9:50 AM   Specimen: BLOOD  Result Value Ref Range Status   Specimen Description BLOOD LEFT [facility]  Final   Special Requests   Final    BOTTLES DRAWN AEROBIC AND ANAEROBIC Blood Culture adequate volume   Culture   Final    NO GROWTH 5 DAYS Performed at [facility], [address], [place], [place] [address]    Report Status 08/18/2020 FINAL  Final  Blood Culture (routine x 2)     Status: None   Collection Time: 08/20/2020  9:50 AM   Specimen:  BLOOD  Result Value Ref Range Status   Specimen Description BLOOD RIGHT AC  Final   Special Requests   Final    BOTTLES DRAWN AEROBIC AND ANAEROBIC Blood Culture results may not be optimal due to an excessive volume of blood received in culture bottles   Culture   Final    NO GROWTH 5 DAYS Performed at [facility], [address], [place], [place] [address]    Report Status 08/18/2020 FINAL  Final   Urine culture     Status: None   Collection Time: 08/19/2020 11:36 AM   Specimen: Urine, Random  Result Value Ref Range Status   Specimen Description   Final    URINE, RANDOM Performed at [facility], [address] [place], [place] [address]    Special Requests   Final    NONE Performed at [facility], [address], [place], [place] [address]    Culture   Final    NO GROWTH Performed at [facility], [place] [address], [place], [place] [address]    Report Status 08/14/2020 FINAL  Final  Culture, respiratory     Status: None (Preliminary result)   Collection Time: 08/17/20  3:15 PM   Specimen: Tracheal Aspirate; Respiratory  Result Value Ref Range Status   Specimen Description   Final    TRACHEAL ASPIRATE Performed at [facility], [address], [place], [place] [address]    Special Requests   Final    NONE Performed at [facility], [address] [place], [place] [address]    Gram Stain   Final    NO WBC SEEN MODERATE GRAM VARIABLE ROD Performed at [facility], [place] [address] [place], [place] [address]    Culture ABUNDANT SERRATIA MARCESCENS  Final   Report Status PENDING  Incomplete  Culture, respiratory     Status: None (Preliminary result)   Collection Time: 08/18/20 11:09 AM   Specimen: Tracheal Aspirate; Respiratory  Result Value Ref Range Status   Specimen Description   Final    TRACHEAL ASPIRATE Performed at [facility], [address], [place], [place] [address]    Special Requests   Final    NONE Performed at [facility], [place], [place] [address]    Gram Stain   Final    MODERATE WBC PRESENT, PREDOMINANTLY PMN MODERATE GRAM NEGATIVE RODS FEW GRAM VARIABLE ROD RARE GRAM POSITIVE COCCI    Culture   Final    ABUNDANT SERRATIA MARCESCENS SUSCEPTIBILITIES TO FOLLOW Performed at [facility], [place] [address] [place], [place] [address]    Report  Status PENDING  Incomplete  MRSA PCR Screening     Status: None   Collection Time: 08/18/20 12:52 PM   Specimen: Nasopharyngeal  Result Value Ref Range Status   MRSA by PCR NEGATIVE NEGATIVE Final    Comment:        The GeneXpert MRSA Assay (FDA approved for NASAL specimens only), is one component of a comprehensive MRSA colonization surveillance program. It is not intended to diagnose MRSA infection nor to guide or monitor treatment for MRSA infections. Performed at [facility], [address] [place], [place] [address]           IMAGING    DG Chest Port 1 View  Result Date: 08/19/2020 CLINICAL DATA:  71 year old male COVID-19.  Intubated. EXAM: PORTABLE CHEST 1 VIEW COMPARISON:  Portable chest 08/18/2020 and  earlier. FINDINGS: Portable AP upright view at 0537 hours. Stable lines and tubes. Left IJ dual lumen catheter again noted. Increased veiling opacity in the right lower lung and increased dense retrocardiac opacity obscuring some of the left hemidiaphragm. Elsewhere coarse and indistinct pulmonary opacity is stable. No pneumothorax identified. Mediastinal contours remain normal. Paucity of bowel gas in the upper abdomen. IMPRESSION: 1. Evidence of small to moderate new right pleural effusion, increasing bilateral lower lobe collapse or consolidation. Underlying bilateral COVID-19 pneumonia suspected. 2. Stable lines and tubes. Electronically Signed   By: Genevie Ann M.D.   On: 08/19/2020 07:43     Nutrition Status: Nutrition Problem: Moderate Malnutrition Etiology: chronic illness (CKD) Signs/Symptoms: mild fat depletion,mild muscle depletion Interventions: Tube feeding     Indwelling Urinary Catheter continued, requirement due to   Reason to continue Indwelling Urinary Catheter strict Intake/Output monitoring for hemodynamic instability         Ventilator continued, requirement due to severe respiratory failure   Ventilator Sedation RASS 0 to -2              82.8 cm/sec peak vel    2.7 mmHg peak grad  _________________________________________________________________________________________  INTERPRETATION  NORMAL LEFT VENTRICULAR SYSTOLIC FUNCTION  WITH MILD LVH  NORMAL RIGHT VENTRICULAR SYSTOLIC FUNCTION  MILD VALVULAR REGURGITATION (See above)  NO VALVULAR STENOSIS  MILD TR, PR  TRIVIAL MR  EF 45-50%  _________________________________________________________________________________________  Electronically signed by  Lujean Amel, MD on 01/25/2018 07: 58 AM      Performed By: Maurilio Lovely, RDCS  Ordering Physician: Lujean Amel  _________________________________________________________________________________________  Other Result Text  Interface, Text Results In - 01/25/2018 7:58 AM EDT            CARDIOLOGY DEPARTMENT          MARKEIS, ALLMAN       [facility]                  [id number]       [facility]             Acct #: [id number]       [address], [place], [place] [address]    Date: 01/20/2018 10: 04 AM                                Adult  Male  Age: 21 yrs       ECHOCARDIOGRAM REPORT              Outpatient                                [id number]^^[id number]    STUDY:CHEST WALL        TAPE:          MD1: CALLWOOD, DWAYNE DENNIS     ECHO:Yes  DOPPLER:Yes    FILE:          BP: 122/72 mmHg    COLOR:Yes  CONTRAST:No   MACHINE:Philips  RV BIOPSY:No     3D:No SOUND QLTY:Moderate      Height: 74 in    MEDIUM:None                       Weight: 208 lb  BSA: 2.2 m2  _________________________________________________________________________________________         HISTORY: DOE          REASON: Assess, LV function       INDICATION: CAD in native artery [I25.10 (ICD-10-CM)]  _________________________________________________________________________________________  ECHOCARDIOGRAPHIC MEASUREMENTS  2D DIMENSIONS  AORTA         Values  Normal Range  MAIN PA     Values  Normal Range         Annulus: 2.2 cm    [2.3-2.9]     PA Main: nm*    [1.5-2.1]        Aorta Sin: 3.5 cm    [3.1-3.7]  RIGHT VENTRICLE       ST Junction: nm*     [2.6-3.2]     RV Base: nm*    [<4.2]        Asc.Aorta: nm*     [2.6-3.4]     RV Mid: nm*    [<3.5]  LEFT VENTRICLE                   RV Length: 3.5 cm  [<8.6]          LVIDd: 4.8 cm    [4.2-5.9]  INFERIOR VENA CAVA          LVIDs: 3.5 cm            Max. IVC: nm*   [<=2.1]           FS: 26.6 %    [>25]      Min. IVC: nm*           SWT: 1.3 cm    [0.6-1.0]  ------------------           PWT: 1.2 cm    [0.6-1.0]  nm* - not measured  LEFT ATRIUM         LA Diam: 4.0 cm    [3.0-4.0]       LA A4C Area: nm*     [<20]        LA Volume: nm*     [18-58]  _________________________________________________________________________________________  ECHOCARDIOGRAPHIC DESCRIPTIONS  AORTIC ROOT         Size: Normal       Dissection: INDETERM FOR DISSECTION  AORTIC VALVE        Leaflets: Tricuspid          Morphology: Normal        Mobility: Fully mobile  LEFT VENTRICLE          Size: Normal            Anterior: Normal       Contraction: Normal             Lateral: Normal       Closest EF: 50% (Estimated)         Septal: Normal        LV Masses: No Masses            Apical: Normal          LVH: MILD LVH            Inferior: Normal                            Posterior: Normal      Dias.FxClass: N/A  MITRAL VALVE        Leaflets: Normal            Mobility: Fully mobile  Morphology: Normal  LEFT ATRIUM          Size: Normal            LA Masses: No masses        IA Septum: Normal IAS  MAIN PA          Size: Normal  PULMONIC VALVE       Morphology: Normal            Mobility: Fully mobile  RIGHT VENTRICLE        RV Masses: No Masses             Size: Normal        Free Wall: Normal           Contraction: Normal  TRICUSPID VALVE        Leaflets: Normal           Mobility: Fully mobile       Morphology: Normal  RIGHT ATRIUM          Size: Normal            RA Other: None         RA Mass: No masses  PERICARDIUM          Fluid: No effusion  INFERIOR VENACAVA          Size: Normal Normal respiratory collapse  _________________________________________________________________________________________   DOPPLER ECHO and OTHER SPECIAL PROCEDURES         Aortic: No AR           No AS             101.8 cm/sec peak vel   4.1 mmHg peak grad         Mitral: TRIVIAL MR         No MS             3.6 cm^2 by DOPPLER             MV Inflow E Vel = 39.8 cm/sec   MV Annulus E'Vel = 4.9 cm/sec            E/E'Ratio = 8.1        Tricuspid: MILD TR          No TS             224.0 cm/sec peak TR vel  25.1 mmHg peak RV pressure        Pulmonary: MILD PR          No PS             82.8 cm/sec peak vel    2.7 mmHg peak grad  _________________________________________________________________________________________  INTERPRETATION  NORMAL LEFT VENTRICULAR  SYSTOLIC FUNCTION  WITH MILD LVH  NORMAL RIGHT VENTRICULAR SYSTOLIC FUNCTION  MILD VALVULAR REGURGITATION (See above)  NO VALVULAR STENOSIS  MILD TR, PR  TRIVIAL MR  EF 45-50%  _________________________________________________________________________________________  Electronically signed by  Lujean Amel, MD on 01/25/2018 07: 66 AM      Performed By: Maurilio Lovely, RDCS   Ordering Physician: Lujean Amel  _________________________________________________________________________________________ Specimen Collected: -- Last Resulted: --  Date: 01/25/18   Received From: [facility]  Severe ACUTE Hypoxic and Hypercapnic Respiratory Failure due to severe metabolic acidosis due to severe DKA due to severe viral gastroenteritis due to COVID 19 infection complicated by acute renal failure Dx of DKA with diabetic coma  Acute hypoxemic respiratory failure due to COVID-19 pneumonia / ARDS          -Initially + 1st week of Jan 2022 - now finished full course antiviral therapy  Mechanical ventilation via ARDS protocol, target PRVC 6 cc/kg -Diuresis when out of shock physiology - monitor UOP - utilize external urinary catheter if possible -supportive care with ICU telemetry monitoring -procalcitonin, CRP and ferritin trending    Atrial fibrillation with RVR  - cardiology on case - discussed with Dr Ubaldo Glassing today   - currently on amiodarone gtt - plan for cardioversion   -Shock physiology improved MAP>>>70 - have dcd neosyneprhine , re-initiating beta blockade today , weaning off vasopressin     ACUTE KIDNEY INJURY/Renal Failure -continue Foley Catheter-assess need -Avoid nephrotoxic agents- dcd MVT -Follow urine output, BMP -Ensure adequate renal perfusion, optimize oxygenation -Renal dose medications  - vasc cath placement and HD today     NEUROLOGY Acute toxic metabolic encephalopathy, need for sedation Goal  RASS -2 to -3  Circulatory shock Etiology - possible distributive due to Sepsis pneumonia - present on admission due to COVID19 vs obstructive due to acute PE vs cadiogenic in context of tachycardia induced cardiomyopathy with AFRVR -will optimize medically by decreasing arythmogenic potential - will minimize neosynephrine and swtich to vasopressin with goal to be off all vasopressors asap - continue ketamine infusion for sedation - note most recent TTE in 2019 with EF 50% and adequate parameters in diastology with minimal vavular disease -cardiology on case - plan for possible cardiovert today - continue with amiodarone -use vasopressors to keep MAP>65 -follow ABG and LA -follow up cultures -emperic ABX -CXR with interstitial congestion but clear costophrenic angles - continue rescusitative fluids for now       - tracheal aspirate in process with microbiology  ID -continue IV abx as prescibed -follow up cultures  GI GI PROPHYLAXIS as indicated   DIET-->TF's as tolerated Constipation protocol as indicated  ENDO - will use ICU hypoglycemic\Hyperglycemia protocol if indicated     ELECTROLYTES -follow labs as needed -replace as needed -pharmacy consultation and following   DVT/GI PRX ordered and assessed TRANSFUSIONS AS NEEDED MONITOR FSBS I Assessed the need for Labs I Assessed the need for Foley I Assessed the need for Central Venous Line Family Discussion when available I Assessed the need for Mobilization I made an Assessment of medications to be adjusted accordingly Safety Risk assessment completed   CASE DISCUSSED IN MULTIDISCIPLINARY ROUNDS WITH ICU TEAM  Critical Care Time devoted to patient care services described in this note is 32 minutes.   Overall, patient is critically ill, prognosis is guarded.  Patient with Multiorgan failure and at high risk for cardiac arrest and death.     Ottie Glazier, M.D.  [facility] [place]

## 2020-08-19 NOTE — Progress Notes (Signed)
ANTICOAGULATION CONSULT NOTE  Pharmacy Consult for heparin Indication: atrial fibrillation  Patient Measurements: Heparin Dosing Weight: 90 kg  Labs: Recent Labs     0000 08/16/20 1339 08/16/20 1513 08/16/20 2144 08/17/20 0439 08/17/20 0835 08/17/20 1729 08/18/20 0429 08/19/20 0302 08/19/20 0315  HGB  --   --   --    < >  --  13.9  --  11.6* 11.4*  --   HCT  --   --   --   --   --  41.0  --  33.5* 32.1*  --   PLT  --   --   --   --   --  188  --  123* 119*  --   HEPARINUNFRC  --   --   --    < >  --  0.54 0.56 0.42 0.28*  --   CREATININE   < >  --  6.12*  --  5.23*  --   --  6.13*  --  5.28*  CKMB  --  28.3*  --   --   --   --   --   --   --   --   TROPONINIHS  --  1,874* 1,755*  --   --   --   --   --   --   --    < > = values in this interval not displayed.    Estimated Creatinine Clearance: 16 mL/min (A) (by C-G formula based on SCr of 5.28 mg/dL (H)).   Medical History: Past Medical History:  Diagnosis Date  . CAD (coronary artery disease)   . Cataract   . CHICKENPOX, HX OF 08/30/2008   Qualifier: Diagnosis of  By: [name], [name]    . Chronic kidney disease   . Diabetes mellitus   . Glaucoma   . History of urinary calculi 08/30/2008   Qualifier: Diagnosis of  By: [name], Jacelyn Pi   . Hyperlipidemia   . Hypertension   . Renal calculus or stone   . Skin cancer of nose    ears and hands   Assessment: 70 year old male admitted with severe DKA, which has since resolved and patient now on SQ insulin regimen. Patient with worsening renal function, plan to start HD 1/13. Patient with new afib 1/13, started on amiodarone and heparin.  Goal of Therapy:  Heparin level 0.3-0.7 units/ml Monitor platelets by anticoagulation protocol: Yes   Plan:  --1/15 at 0429 HL = 0.42, therapeutic x 3. CBC is worse today, PLTs trending down, monitor closely.   --Will continue heparin infusion at current rate of 1200 units/hr --Re-check HL tomorrow AM --Daily CBC per protocol  while on heparin infusion   0116 0302 HL 0.28, slightly SUBtherapeutic.  CBC low but stable now.  Will increase Heparin infusion to 1300 units/hr and recheck HL in ~ 8 hrs.  HL had previously been therapeutic and fairly stable on 1200 units/hr.  Monitor CBC  Hart Robinsons A 08/19/2020,5:26 AM

## 2020-08-19 NOTE — Progress Notes (Signed)
Patient had a desaturation event to 80% sustaining. On bedside assessment patient pulling hard & asynchronous with ventilator- stacking breaths but sedated. - Patient given 10 mg vecuronium, but continued to remain hypoxic between 82-85% - Ventilator settings titrated up, ultimately to FiO2 100%/ PEEP 10 - still patient 85%- 87% - 100 meq of sodium bicarbonate given, and SpO2 89-91%  DDx: PE vs ARDS Patient is already receiving systemic anticoagulation. Follow up CXR ordered for this AM- previous demonstrated worsening bilateral opacities. ABG ordered for 04:00 & will f/u on AM lab work.   Domingo Pulse Rust-[place], AGACNP-BC Acute Care Nurse Practitioner [facility]   [phone] / [phone] Please see Amion for pager details.

## 2020-08-19 NOTE — Progress Notes (Signed)
ANTICOAGULATION CONSULT NOTE  Pharmacy Consult for heparin Indication: atrial fibrillation  Patient Measurements: Heparin Dosing Weight: 90 kg  Labs: Recent Labs    08/16/20 1513 08/16/20 2144 08/17/20 0439 08/17/20 0835 08/17/20 1729 08/18/20 0429 08/19/20 0302 08/19/20 0315 08/19/20 1400  HGB  --    < >  --  13.9  --  11.6* 11.4*  --   --   HCT  --   --   --  41.0  --  33.5* 32.1*  --   --   PLT  --   --   --  188  --  123* 119*  --   --   HEPARINUNFRC  --    < >  --  0.54   < > 0.42 0.28*  --  0.36  CREATININE 6.12*  --  5.23*  --   --  6.13*  --  5.28*  --   TROPONINIHS 1,755*  --   --   --   --   --   --   --   --    < > = values in this interval not displayed.    Estimated Creatinine Clearance: 16 mL/min (A) (by C-G formula based on SCr of 5.28 mg/dL (H)).   Medical History: Past Medical History:  Diagnosis Date  . CAD (coronary artery disease)   . Cataract   . CHICKENPOX, HX OF 08/30/2008   Qualifier: Diagnosis of  By: [name], [name]    . Chronic kidney disease   . Diabetes mellitus   . Glaucoma   . History of urinary calculi 08/30/2008   Qualifier: Diagnosis of  By: [name], Jacelyn Pi   . Hyperlipidemia   . Hypertension   . Renal calculus or stone   . Skin cancer of nose    ears and hands   Assessment: 70 year old male admitted with severe DKA, which has since resolved and patient now on SQ insulin regimen. Patient with worsening renal function, plan to start HD 1/13. Patient with new afib 1/13, started on amiodarone and heparin.  -1/15 at 0429 HL = 0.42, therapeutic x 3. CBC is worse today, PLTs trending down, monitor closely.   Will continue heparin infusion at current rate of 1200 units/hr -0116 0302 HL 0.28, slightly SUBtherapeutic.  CBC low but stable now.  Will increase Heparin infusion to 1300 units/hr    Goal of Therapy:  Heparin level 0.3-0.7 units/ml Monitor platelets by anticoagulation protocol: Yes   Plan:   1/16 @1400   HL 0.36,  therapeutic.  Hgb 11.4  Plt 119.  Will continue Heparin infusion at 1300 units/hr and check confirmatory HL in ~ 8 hrs.    Monitor CBC  Tyrika Newman A 08/19/2020,2:01 PM

## 2020-08-20 ENCOUNTER — Inpatient Hospital Stay: Payer: HMO

## 2020-08-20 DIAGNOSIS — U071 COVID-19: Secondary | ICD-10-CM | POA: Diagnosis not present

## 2020-08-20 LAB — C-REACTIVE PROTEIN: CRP: 22.3 mg/dL — ABNORMAL HIGH (ref <1.0)

## 2020-08-20 LAB — HEPARIN LEVEL (UNFRACTIONATED): Heparin Unfractionated: 0.31 IU/mL (ref 0.30–0.70)

## 2020-08-20 LAB — CULTURE, RESPIRATORY W GRAM STAIN: Gram Stain: NONE SEEN

## 2020-08-20 LAB — GLUCOSE, CAPILLARY
Glucose-Capillary: 115 mg/dL — ABNORMAL HIGH (ref 70–99)
Glucose-Capillary: 124 mg/dL — ABNORMAL HIGH (ref 70–99)
Glucose-Capillary: 142 mg/dL — ABNORMAL HIGH (ref 70–99)
Glucose-Capillary: 149 mg/dL — ABNORMAL HIGH (ref 70–99)
Glucose-Capillary: 68 mg/dL — ABNORMAL LOW (ref 70–99)
Glucose-Capillary: 82 mg/dL (ref 70–99)
Glucose-Capillary: 84 mg/dL (ref 70–99)
Glucose-Capillary: 87 mg/dL (ref 70–99)

## 2020-08-20 LAB — MAGNESIUM: Magnesium: 2 mg/dL (ref 1.7–2.4)

## 2020-08-20 LAB — RENAL FUNCTION PANEL
Albumin: 1.6 g/dL — ABNORMAL LOW (ref 3.5–5.0)
Anion gap: 16 — ABNORMAL HIGH (ref 5–15)
BUN: 147 mg/dL — ABNORMAL HIGH (ref 8–23)
CO2: 21 mmol/L — ABNORMAL LOW (ref 22–32)
Calcium: 6.4 mg/dL — CL (ref 8.9–10.3)
Chloride: 102 mmol/L (ref 98–111)
Creatinine, Ser: 5.93 mg/dL — ABNORMAL HIGH (ref 0.61–1.24)
GFR, Estimated: 10 mL/min — ABNORMAL LOW (ref >60)
Glucose, Bld: 116 mg/dL — ABNORMAL HIGH (ref 70–99)
Phosphorus: 7.5 mg/dL — ABNORMAL HIGH (ref 2.5–4.6)
Potassium: 4.9 mmol/L (ref 3.5–5.1)
Sodium: 139 mmol/L (ref 135–145)

## 2020-08-20 LAB — CBC
HCT: 31.7 % — ABNORMAL LOW (ref 39.0–52.0)
Hemoglobin: 11.6 g/dL — ABNORMAL LOW (ref 13.0–17.0)
MCH: 29.5 pg (ref 26.0–34.0)
MCHC: 36.6 g/dL — ABNORMAL HIGH (ref 30.0–36.0)
MCV: 80.7 fL (ref 80.0–100.0)
Platelets: 100 10*3/uL — ABNORMAL LOW (ref 150–400)
RBC: 3.93 MIL/uL — ABNORMAL LOW (ref 4.22–5.81)
RDW: 14.6 % (ref 11.5–15.5)
WBC: 17.4 10*3/uL — ABNORMAL HIGH (ref 4.0–10.5)
nRBC: 0 % (ref 0.0–0.2)

## 2020-08-20 LAB — FERRITIN: Ferritin: 958 ng/mL — ABNORMAL HIGH (ref 24–336)

## 2020-08-20 LAB — FIBRIN DERIVATIVES D-DIMER (ARMC ONLY): Fibrin derivatives D-dimer (ARMC): 3809.99 ng/mL (FEU) — ABNORMAL HIGH (ref 0.00–499.00)

## 2020-08-20 IMAGING — DX DG CHEST 1V PORT
1 series · 1 of 1 positions shown · non-contrast
Comparison: [DATE]

CLINICAL DATA: Shortness of breath.

EXAM:
PORTABLE CHEST 1 VIEW

[chest ap]
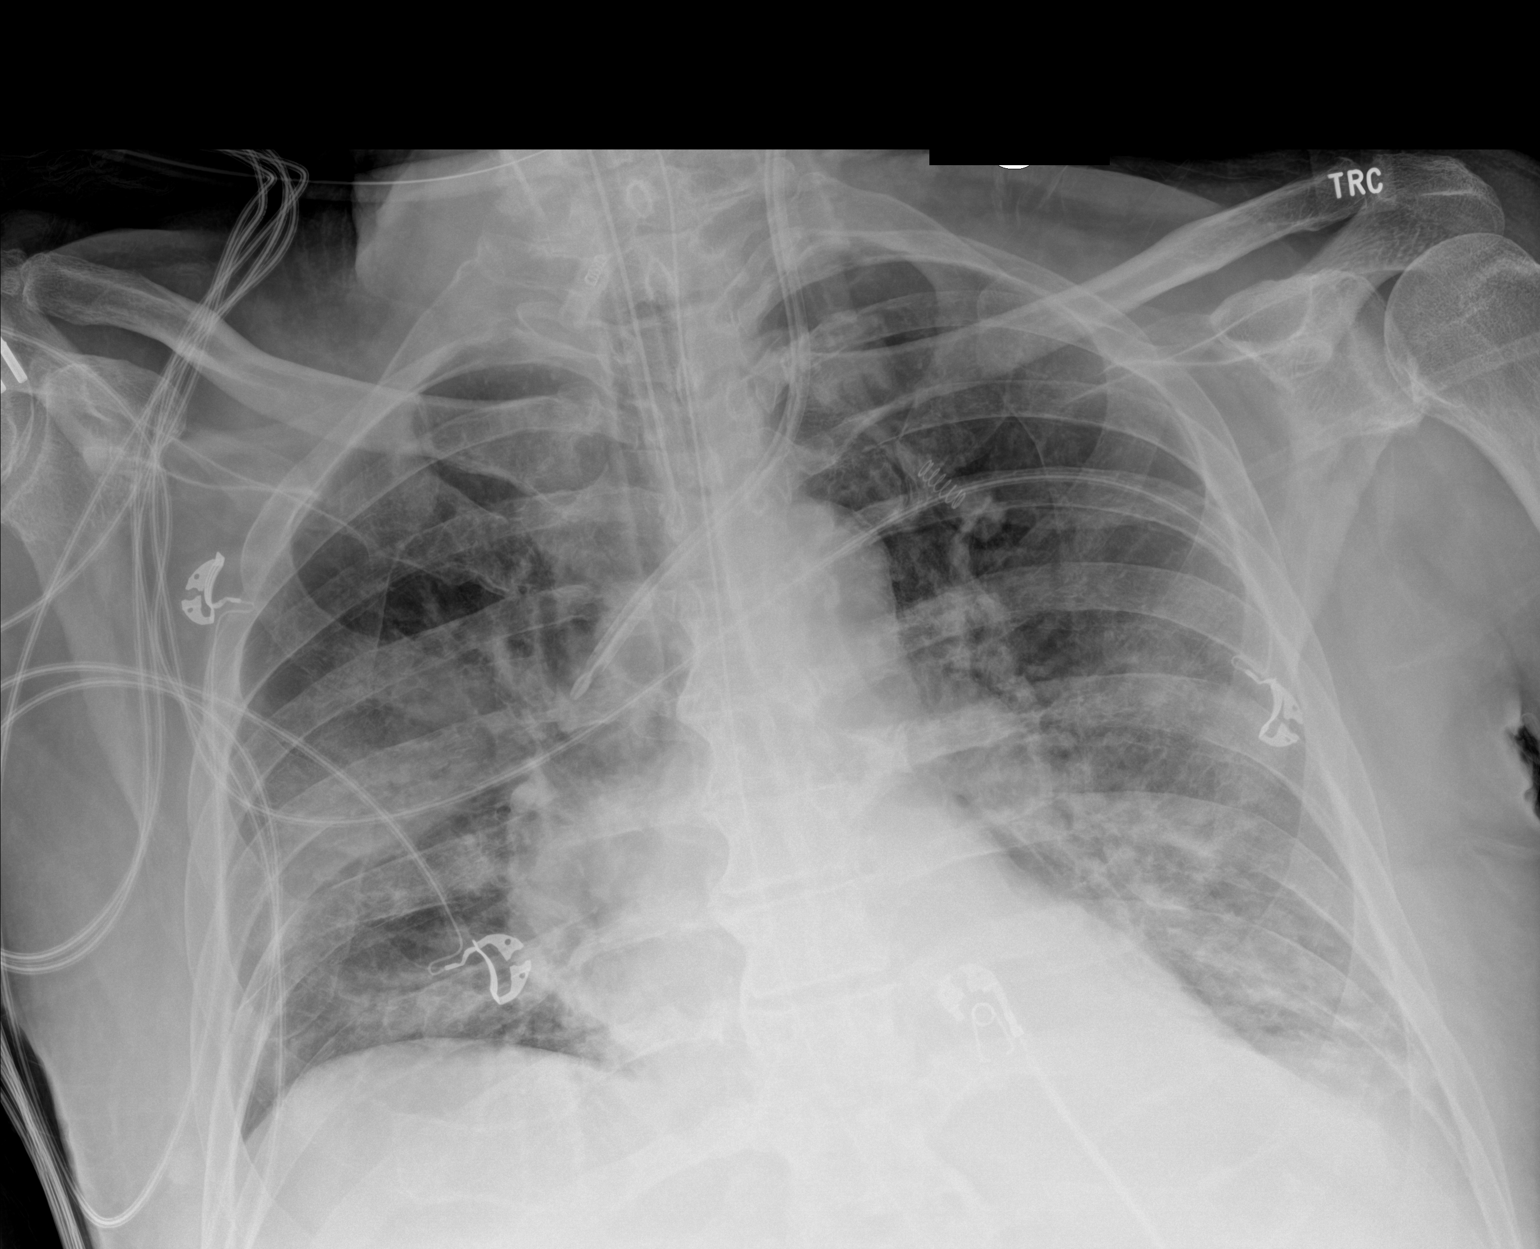

[1 of 1 positions shown; findings below may reference images not displayed]

FINDINGS: The support devices appear relatively stable from prior study.
Bilateral hazy airspace opacities are again noted. There is no
definite pneumothorax. The heart size remains stable. There is a
dense retrocardiac airspace opacity. There are probable bilateral
pleural effusions.
IMPRESSION: 1. Stable support devices.
2. Persistent bilateral hazy airspace opacities and probable
bilateral pleural effusions.

## 2020-08-20 MED ORDER — AMIODARONE HCL 200 MG PO TABS
200.0000 mg | ORAL_TABLET | Freq: Two times a day (BID) | ORAL | Status: DC
  Administered 2020-08-20 – 2020-08-30 (×21): 200 mg
  Filled 2020-08-20 (×22): qty 1

## 2020-08-20 MED ORDER — DEXTROSE 50 % IV SOLN
12.5000 g | INTRAVENOUS | Status: AC
  Administered 2020-08-20: 12.5 g via INTRAVENOUS
  Filled 2020-08-20: qty 50

## 2020-08-20 MED ORDER — AMIODARONE HCL IN DEXTROSE 360-4.14 MG/200ML-% IV SOLN
60.0000 mg/h | INTRAVENOUS | Status: DC
  Administered 2020-08-20: 60 mg/h via INTRAVENOUS

## 2020-08-20 MED ORDER — DEXMEDETOMIDINE HCL IN NACL 400 MCG/100ML IV SOLN
0.4000 ug/kg/h | INTRAVENOUS | Status: DC
  Administered 2020-08-20: 0.6 ug/kg/h via INTRAVENOUS
  Administered 2020-08-20 (×2): 0.8 ug/kg/h via INTRAVENOUS
  Filled 2020-08-20 (×5): qty 100

## 2020-08-20 MED ORDER — MIDAZOLAM 50MG/50ML (1MG/ML) PREMIX INFUSION
0.0000 mg/h | INTRAVENOUS | Status: DC
  Administered 2020-08-20 – 2020-08-21 (×2): 5 mg/h via INTRAVENOUS
  Administered 2020-08-21: 20:00:00 4 mg/h via INTRAVENOUS
  Administered 2020-08-21: 7 mg/h via INTRAVENOUS
  Administered 2020-08-22: 18:00:00 6 mg/h via INTRAVENOUS
  Administered 2020-08-22: 09:00:00 3 mg/h via INTRAVENOUS
  Administered 2020-08-23: 6 mg/h via INTRAVENOUS
  Administered 2020-08-23 (×2): 3 mg/h via INTRAVENOUS
  Filled 2020-08-20 (×9): qty 50

## 2020-08-20 MED ORDER — CALCIUM GLUCONATE-NACL 2-0.675 GM/100ML-% IV SOLN
2.0000 g | Freq: Once | INTRAVENOUS | Status: AC
  Administered 2020-08-20: 2000 mg via INTRAVENOUS
  Filled 2020-08-20: qty 100

## 2020-08-20 MED ORDER — MIDAZOLAM BOLUS VIA INFUSION
1.0000 mg | INTRAVENOUS | Status: DC | PRN
  Administered 2020-08-22 (×2): 2 mg via INTRAVENOUS
  Filled 2020-08-20: qty 2

## 2020-08-20 MED ORDER — INSULIN DETEMIR 100 UNIT/ML ~~LOC~~ SOLN
25.0000 [IU] | Freq: Two times a day (BID) | SUBCUTANEOUS | Status: DC
  Administered 2020-08-20 – 2020-08-21 (×2): 25 [IU] via SUBCUTANEOUS
  Filled 2020-08-20 (×4): qty 0.25

## 2020-08-20 MED ORDER — AMIODARONE HCL IN DEXTROSE 360-4.14 MG/200ML-% IV SOLN
60.0000 mg/h | INTRAVENOUS | Status: DC
  Administered 2020-08-20 (×2): 60 mg/h via INTRAVENOUS
  Filled 2020-08-20: qty 200

## 2020-08-20 MED ORDER — HYDROCORTISONE NA SUCCINATE PF 100 MG IJ SOLR
50.0000 mg | Freq: Two times a day (BID) | INTRAMUSCULAR | Status: DC
  Administered 2020-08-21 – 2020-08-24 (×7): 50 mg via INTRAVENOUS
  Filled 2020-08-20 (×7): qty 2

## 2020-08-20 MED ORDER — CALCIUM GLUCONATE-NACL 1-0.675 GM/50ML-% IV SOLN
1.0000 g | INTRAVENOUS | Status: DC
  Filled 2020-08-20 (×2): qty 50

## 2020-08-20 MED ORDER — CALCIUM GLUCONATE-NACL 2-0.675 GM/100ML-% IV SOLN: 2.0000 g | Freq: Once | INTRAVENOUS | Status: DC

## 2020-08-20 NOTE — Progress Notes (Signed)
ANTICOAGULATION CONSULT NOTE  Pharmacy Consult for heparin Indication: atrial fibrillation  Patient Measurements: Heparin Dosing Weight: 90 kg  Labs: Recent Labs    08/18/20 0429 08/19/20 0302 08/19/20 0315 08/19/20 1400 08/19/20 2101 08/19/20 2226 08/20/20 0319  HGB 11.6* 11.4*  --   --   --   --  11.6*  HCT 33.5* 32.1*  --   --   --   --  31.7*  PLT 123* 119*  --   --   --   --  100*  HEPARINUNFRC 0.42 0.28*  --  0.36  --  0.33 0.31  CREATININE 6.13*  --  5.28*  --  5.82*  --  5.93*    Estimated Creatinine Clearance: 15.5 mL/min (A) (by C-G formula based on SCr of 5.93 mg/dL (H)).   Medical History: Past Medical History:  Diagnosis Date  . CAD (coronary artery disease)   . Cataract   . CHICKENPOX, HX OF 08/30/2008   Qualifier: Diagnosis of  By: [name], [name]    . Chronic kidney disease   . Diabetes mellitus   . Glaucoma   . History of urinary calculi 08/30/2008   Qualifier: Diagnosis of  By: [name], Jacelyn Pi   . Hyperlipidemia   . Hypertension   . Renal calculus or stone   . Skin cancer of nose    ears and hands   Assessment: 70 year old male admitted with severe DKA, which has since resolved and patient now on SQ insulin regimen. Patient with worsening renal function, plan to start HD 1/13. Patient with new afib 1/13, started on amiodarone and heparin.  -1/15 at 0429 HL = 0.42, therapeutic x 3. CBC is worse today, PLTs trending down, monitor closely.   Will continue heparin infusion at current rate of 1200 units/hr -0116 0302 HL 0.28, slightly SUBtherapeutic.  CBC low but stable now.  Will increase Heparin infusion to 1300 units/hr    Goal of Therapy:  Heparin level 0.3-0.7 units/ml Monitor platelets by anticoagulation protocol: Yes   Plan:   1/16 @1400   HL 0.36, therapeutic.  Hgb 11.4  Plt 119.  Will continue Heparin infusion at 1300 units/hr and check confirmatory HL in ~ 8 hrs.    Monitor CBC  1/16 @ 2226 HL 0.33, therapeutic x 2.  Will continue  Heparin infusion at 1300 units/hr and recheck HL in am.  Monitor CBC  1/17 @ 0319 HL 0.31, therapeutic x 3.  H/H stable, PLTs trending down.  Continue Heparin at current rate.  Monitor CBC daily per protocol.  [place] Crane, Rieley Khalsa A 08/20/2020,5:50 AM

## 2020-08-20 NOTE — Progress Notes (Signed)
NAME:  Nathaniel Thomas, MRN:  [id number], DOB:  11-Apr-1951, LOS: 7 ADMISSION DATE:  08/29/2020, CONSULTATION DATE:  08/22/2020 REFERRING MD:  Raliegh Ip CHIEF COMPLAINT:  Respiratory failure  Brief History:  70 y.o.malewith PMH as noted below who presents with altered mental status after he was found unresponsive this morning.   Per EMS he was initially hypoxic although this appears to have resolved. The patient himself is unable to give any history.  Patient Dx with COVID 1st week of Jan 2022 +NVD for several days ER shows severe acidosis with elevated sugars Patient with severe hypothermia and intubated for severe resp failure  Past Medical History:  He,  has a past medical history of CAD (coronary artery disease), Cataract, CHICKENPOX, HX OF (08/30/2008), Chronic kidney disease, Diabetes mellitus, Glaucoma, History of urinary calculi (08/30/2008), Hyperlipidemia, Hypertension, Renal calculus or stone, and Skin cancer of nose.   Significant Hospital Events:  1/10 admitted to ICU, severe DKA, gastroentertitis 1/11 severe resp failure 1/12 severe DKA 1/13 severe resp failure, worsening Renal failure 1/14- Reviewed care plan with cardiologist Dr Ubaldo Glassing, plan for cardiac cardoversion. Patient remains in shock with AFRVR on MV, medically optimizing for procedure.  I met with daughter York Cerise.   1/15- patient improved slightly, now with rate controlled AF off Neo. Discussed case with cardiology.  08/19/20- vitals are improved, rate controlled.  BP normalized.  Ventilator weaned to FiO2 60% remains crtically ill.    Consults:  Cardiology Nephrology  Procedures:  08/16/20 CVL 08/20/2020 CVL 08/21/2020 ETT  Significant Diagnostic Tests:  CT Head 08/29/2020 Negative motion degraded head CT.  Micro Data:  Tracheal Aspirate 08/18/20 - Serratia Marcescens   Antimicrobials:  Vancomycin 1/10 Cefepime 1/10   Interim History / Subjective:  No acute events overnight  Patient has guppy breathing which  responds to versed.  Objective   Blood pressure 138/71, pulse 86, temperature 98.96 F (37.2 C), resp. rate (!) 25, height 6' 4"  (1.93 m), weight 106.2 kg, SpO2 96 %.    Vent Mode: PRVC FiO2 (%):  [60 %] 60 % Set Rate:  [22 bmp] 22 bmp Vt Set:  [520 mL] 520 mL PEEP:  [10 cmH20] 10 cmH20 Plateau Pressure:  [20 cmH20-21 cmH20] 20 cmH20   Intake/Output Summary (Last 24 hours) at 08/20/2020 0912 Last data filed at 08/20/2020 0600 Gross per 24 hour  Intake 3828.64 ml  Output 100 ml  Net 3728.64 ml   Filed Weights   08/15/20 0500 08/17/20 0433 08/20/20 0500  Weight: 90.9 kg 94.3 kg 106.2 kg    Examination: General: chronically ill appearing male, sedated HENT: [place]/AT, ET tube in place, moist mucous membranes Lungs: Course breath sounds. No wheezing Cardiovascular: RRR, s1s2 Abdomen: soft, non-distended, BS+ Extremities: 1+ edema Neuro: sedated, not responsive GU: foley in place  Resolved Hospital Problem list     Assessment & Plan:  Acute hypoxemic Respiratory failure due to Covid 19 Pneumonia - Continue mechanical ventilatory support - Unable to wean sedation due to agonal respirations - Serratia Marcesans pneumonia, treating with cefepime  Acute Kidney Injury - Nephrology following - iHD, next session tomorrow  Shock - he has been weaned off vasopressor support - Reducing frequency of stress dose steroids to q12 hours from q6 hours. Continue to taper over coming days  Encephalopathy In setting of multiorgan failure - requiring high amounts of sedation  Atrial Fibrillation - continue amiodarone - heparin dripp  Best practice (evaluated daily)  Diet: TF Pain/Anxiety/Delirium protocol (if indicated): fentanyl, precedex, versed VAP  protocol (if indicated): ordered DVT prophylaxis: heparin GI prophylaxis: PPI  Glucose control: SSI Mobility: bed rest Disposition: ICU Code Status: Full code, spoke with daughter and wife on phone 08/20/20. They wish for patient to  remain full code. Explained that he would not survive CPR and it would only do more harm than good. They understood, but wished for him to remain full code.   Labs   CBC: Recent Labs  Lab 08/20/2020 0950 08/29/2020 1615 08/16/20 0416 08/17/20 0835 08/18/20 0429 08/19/20 0302 08/20/20 0319  WBC 23.2*   < > 24.9* 22.9* 16.8* 21.2* 17.4*  NEUTROABS 16.1*  --   --   --   --   --   --   HGB 16.4   < > 12.6* 13.9 11.6* 11.4* 11.6*  HCT 53.9*   < > 36.5* 41.0 33.5* 32.1* 31.7*  MCV 97.3   < > 87.1 88.2 86.8 84.3 80.7  PLT 514*   < > 158 188 123* 119* 100*   < > = values in this interval not displayed.    Basic Metabolic Panel: Recent Labs  Lab 08/16/20 1339 08/16/20 1513 08/17/20 0439 08/18/20 0429 08/19/20 0302 08/19/20 0315 08/19/20 2101 08/20/20 0319  NA  --  142 143 140  --  142 141 139  K  --  4.9 4.3 4.7  --  4.1 4.9 4.9  CL  --  109 111 108  --  102 100 102  CO2  --  17* 19* 16*  --  24 22 21*  GLUCOSE  --  329* 229* 210*  --  122* 154* 116*  BUN  --  124* 99* 118*  --  106* 142* 147*  CREATININE  --  6.12* 5.23* 6.13*  --  5.28* 5.82* 5.93*  CALCIUM  --  5.6* 5.9* 6.1*  --  6.2* 6.2* PENDING  MG 2.2  --  2.0 2.2 1.9  --   --  2.0  PHOS  --  7.6* 5.9* 6.6*  --  5.2*  --  7.5*   GFR: Estimated Creatinine Clearance: 15.5 mL/min (A) (by C-G formula based on SCr of 5.93 mg/dL (H)). Recent Labs  Lab 08/27/2020 0950 08/22/2020 1136 08/12/2020 1615 08/14/20 0300 08/14/20 0957 08/15/20 0458 08/16/20 0416 08/17/20 0835 08/18/20 0429 08/19/20 0302 08/20/20 0319  PROCALCITON  --   --   --  3.36 3.00 5.11  --   --   --   --   --   WBC 23.2*  --    < >  --  34.7* 27.9*   < > 22.9* 16.8* 21.2* 17.4*  LATICACIDVEN 4.4* 3.8*  --   --   --   --   --   --   --   --   --    < > = values in this interval not displayed.    Liver Function Tests: Recent Labs  Lab 08/18/2020 0950 08/16/20 0416 08/16/20 1513 08/17/20 0439 08/18/20 0429 08/19/20 0315 08/20/20 0319  AST 32  --    --   --   --   --   --   ALT 22  --   --   --   --   --   --   ALKPHOS 72  --   --   --   --   --   --   BILITOT 1.9*  --   --   --   --   --   --  PROT 8.1  --   --   --   --   --   --   ALBUMIN 3.6   < > 2.1* 1.8* 1.7* 1.7* 1.6*   < > = values in this interval not displayed.   No results for input(s): LIPASE, AMYLASE in the last 168 hours. No results for input(s): AMMONIA in the last 168 hours.  ABG    Component Value Date/Time   PHART 7.39 08/19/2020 0250   PCO2ART 41 08/19/2020 0250   PO2ART 97 08/19/2020 0250   HCO3 24.8 08/19/2020 0250   ACIDBASEDEF 0.2 08/19/2020 0250   O2SAT 97.5 08/19/2020 0250     Coagulation Profile: Recent Labs  Lab 08/12/2020 0950  INR 1.3*    Cardiac Enzymes: Recent Labs  Lab 08/16/20 1339  CKMB 28.3*    HbA1C: Hemoglobin A1C  Date/Time Value Ref Range Status  08/10/2017 11:53 AM 7.5  Final  04/09/2017 08:30 AM 7.3  Final   Hgb A1c MFr Bld  Date/Time Value Ref Range Status  01/11/2015 08:33 AM 7.5 (H) 4.6 - 6.5 % Final    Comment:    Glycemic Control Guidelines for People with Diabetes:Non Diabetic:  <6%Goal of Therapy: <7%Additional Action Suggested:  >8%   07/31/2014 03:27 PM 7.2 (H) 4.6 - 6.5 % Final    Comment:    Glycemic Control Guidelines for People with Diabetes:Non Diabetic:  <6%Goal of Therapy: <7%Additional Action Suggested:  >8%     CBG: Recent Labs  Lab 08/19/20 1608 08/19/20 1952 08/20/20 0000 08/20/20 0358 08/20/20 0808  GLUCAP 196* 153* 142* 115* 87    Critical care time: 50 minutes    Freda Jackson, MD [facility] Office: [phone]   See Amion for Pager Details

## 2020-08-20 NOTE — Progress Notes (Signed)
Patient Name: Nathaniel Thomas Date of Encounter: 08/20/2020  Hospital Problem List     Active Problems:   COVID-19   Malnutrition of moderate degree    Patient Profile      70 y.o.malewith history ofdiabetes, hypertension and hyperlipidemia who was admitted on January 10 of this year after altered mental status and was found to be unresponsive. He had been diagnosed with COVID 1 week prior. He had nausea vomiting and diarrhea for several days and was unable to take in medications. He was noted to be severely acidotic with elevated sugars is consistent with DKA. He had severe hypothermia and was intubated for severe respiratory failure. He has a history of being evaluated with a coronary artery CT in 2012 showing noncritical disease. He developed atrial fibrillation rapid ventricular response today. Throughout his hospital stay he has been noted to have gradually worsening renal function. His serum troponins were normal on admission. He had acute renal injury with a creatinine of 2.01 up from normal creatinine as a base line. Echo in 2019 showed ef of 50%. Has develped worsening renal funciton wit creatine of 6.12. Afib rate is still rapid but heart rate is improved with rates in the 100s to 100 teens or currently.  Blood pressure is more stable.  Subjective      Inpatient Medications    . amiodarone  200 mg Per Tube BID  . chlorhexidine gluconate (MEDLINE KIT)  15 mL Mouth Rinse BID  . Chlorhexidine Gluconate Cloth  6 each Topical Daily  . docusate  100 mg Per Tube BID  . feeding supplement (PROSource TF)  90 mL Per Tube TID  . hydrocortisone sod succinate (SOLU-CORTEF) inj  50 mg Intravenous Q6H  . insulin aspart  0-20 Units Subcutaneous Q4H  . insulin aspart  6 Units Subcutaneous Q4H  . insulin detemir  53 Units Subcutaneous BID  . mouth rinse  15 mL Mouth Rinse 10 times per day  . metoprolol tartrate  12.5 mg Per NG tube BID  . sodium chloride flush  3 mL Intravenous  Q12H    Vital Signs    Vitals:   08/20/20 0500 08/20/20 0600 08/20/20 0700 08/20/20 1326  BP: (!) 100/55 140/82 138/71   Pulse: 79 85 86   Resp: 20 (!) 22 (!) 25   Temp: 99.32 F (37.4 C)  98.96 F (37.2 C)   TempSrc:      SpO2: 95% 96% 96% 96%  Weight: 106.2 kg     Height:        Intake/Output Summary (Last 24 hours) at 08/20/2020 1451 Last data filed at 08/20/2020 1213 Gross per 24 hour  Intake 4424.14 ml  Output 85 ml  Net 4339.14 ml   Filed Weights   08/15/20 0500 08/17/20 0433 08/20/20 0500  Weight: 90.9 kg 94.3 kg 106.2 kg    Physical Exam       Labs    CBC Recent Labs    08/19/20 0302 08/20/20 0319  WBC 21.2* 17.4*  HGB 11.4* 11.6*  HCT 32.1* 31.7*  MCV 84.3 80.7  PLT 119* 350*   Basic Metabolic Panel Recent Labs    08/19/20 0302 08/19/20 0315 08/19/20 2101 08/20/20 0319  NA  --  142 141 139  K  --  4.1 4.9 4.9  CL  --  102 100 102  CO2  --  24 22 21*  GLUCOSE  --  122* 154* 116*  BUN  --  106* 142* 147*  CREATININE  --  5.28* 5.82* 5.93*  CALCIUM  --  6.2* 6.2* 6.4*  MG 1.9  --   --  2.0  PHOS  --  5.2*  --  7.5*   Liver Function Tests Recent Labs    08/19/20 0315 08/20/20 0319  ALBUMIN 1.7* 1.6*   No results for input(s): LIPASE, AMYLASE in the last 72 hours. Cardiac Enzymes No results for input(s): CKTOTAL, CKMB, CKMBINDEX, TROPONINI in the last 72 hours. BNP No results for input(s): BNP in the last 72 hours. D-Dimer No results for input(s): DDIMER in the last 72 hours. Hemoglobin A1C No results for input(s): HGBA1C in the last 72 hours. Fasting Lipid Panel No results for input(s): CHOL, HDL, LDLCALC, TRIG, CHOLHDL, LDLDIRECT in the last 72 hours. Thyroid Function Tests No results for input(s): TSH, T4TOTAL, T3FREE, THYROIDAB in the last 72 hours.  Invalid input(s): FREET3  Telemetry    Normal sinus rhythm  ECG       Radiology    DG Abd 1 View  Result Date: 08/18/2020 CLINICAL DATA:  Gastroenteritis. EXAM:  ABDOMEN - 1 VIEW COMPARISON:  08/22/2020 FINDINGS: Nasogastric tube is present with tip over the stomach in the left upper quadrant and side-port in the region of the gastroesophageal junction. This could be advanced another 6-8 cm. Bowel gas pattern is nonobstructive. There are a few small calcifications over the kidneys bilaterally likely nephrolithiasis. Catheter tip is seen from below over the right pelvis with tip adjacent the level of the right inferior sacroiliac joint likely femoral venous catheter. IMPRESSION: 1. Nonobstructive bowel gas pattern. 2. Bilateral nephrolithiasis. 3. Nasogastric tube with tip over the stomach in the left upper quadrant and side-port in the region of the gastroesophageal junction. Electronically Signed   By: Marin Olp M.D.   On: 08/18/2020 09:53   DG Abdomen 1 View  Result Date: 08/26/2020 CLINICAL DATA:  Nasogastric tube placement. EXAM: ABDOMEN - 1 VIEW COMPARISON:  Abdominal radiographs 10/09/2016. Abdominal CT 06/15/2018. FINDINGS: 1353 hours. Enteric tube projects below the diaphragm, with tip overlying the gastric fundal region. Side hole is near the gastroesophageal junction. The visualized bowel gas pattern is normal. There are degenerative changes within the thoracolumbar spine. IMPRESSION: Enteric tube projects to the level of the proximal stomach. Electronically Signed   By: Richardean Sale M.D.   On: 08/09/2020 14:07   CT Head Wo Contrast  Result Date: 08/19/2020 CLINICAL DATA:  Change in mental status EXAM: CT HEAD WITHOUT CONTRAST TECHNIQUE: Contiguous axial images were obtained from the base of the skull through the vertex without intravenous contrast. COMPARISON:  None. FINDINGS: Brain: No evidence of acute infarction, hemorrhage, hydrocephalus, extra-axial collection or mass lesion/mass effect. Vascular: No hyperdense vessel or unexpected calcification. Skull: Normal. Negative for fracture or focal lesion. Sinuses/Orbits: No acute finding.  Left  cataract resection. Other: Pervasive motion artifact to a moderate degree. IMPRESSION: Negative motion degraded head CT. Electronically Signed   By: Monte Fantasia M.D.   On: 08/19/2020 11:11   US RENAL  Result Date: 08/14/2020 CLINICAL DATA:  Acute renal failure EXAM: RENAL / URINARY TRACT ULTRASOUND COMPLETE COMPARISON:  Ultrasound kidneys 04/13/2019 MRI abdomen 07/06/2020 FINDINGS: Right Kidney: Renal measurements: 11.0 x 6.9 x 6.1 = volume: 243 mL. 2.3 cm hypoechoic structure in the anterior right kidney likely a simple cyst given appearance on recent MRI. Echogenicity within normal limits. No suspicious mass or hydronephrosis visualized. Left Kidney: Renal measurements: 11.5 x 6.7 x 1.5 = volume: 302 mL. Echogenicity within  normal limits. No mass or hydronephrosis visualized. 12 mm linear echogenicity in the lower pole suspicious for nonobstructing calculus. Bladder: Unable to evaluate as it is collapsed around a Foley catheter balloon. Other: None. IMPRESSION: 1. No hydronephrosis. 2. Nonobstructing left renal calculus. Electronically Signed   By: Miachel Roux M.D.   On: 08/14/2020 08:16   US Venous Img Lower Bilateral (DVT)  Result Date: 08/17/2020 CLINICAL DATA:  Lower extremity edema EXAM: BILATERAL LOWER EXTREMITY VENOUS DOPPLER ULTRASOUND BILATERAL LOWER EXTREMITY VENOUS DOPPLER ULTRASOUND TECHNIQUE: Gray-scale sonography with graded compression, as well as color Doppler and duplex ultrasound were performed to evaluate the lower extremity deep venous systems from the level of the common femoral vein and including the common femoral, femoral, profunda femoral, popliteal and calf veins including the posterior tibial, peroneal and gastrocnemius veins when visible. The superficial great saphenous vein was also interrogated. Spectral Doppler was utilized to evaluate flow at rest and with distal augmentation maneuvers in the common femoral, femoral and popliteal veins. COMPARISON:  None. FINDINGS:  RIGHT LOWER EXTREMITY Common Femoral Vein: No evidence of thrombus. Normal compressibility, respiratory phasicity and response to augmentation. Saphenofemoral Junction: No evidence of thrombus. Normal compressibility and flow on color Doppler imaging. Profunda Femoral Vein: No evidence of thrombus. Normal compressibility and flow on color Doppler imaging. Femoral Vein: No evidence of thrombus. Normal compressibility, respiratory phasicity and response to augmentation. Popliteal Vein: No evidence of thrombus. Normal compressibility, respiratory phasicity and response to augmentation. Calf Veins: No evidence of thrombus. Normal compressibility and flow on color Doppler imaging. Limited visualization of peroneal veins. Superficial Great Saphenous Vein: No evidence of thrombus. Normal compressibility. Other Findings:  None. LEFT LOWER EXTREMITY Common Femoral Vein: No evidence of thrombus. Normal compressibility, respiratory phasicity and response to augmentation. Saphenofemoral Junction: No evidence of thrombus. Normal compressibility and flow on color Doppler imaging. Profunda Femoral Vein: No evidence of thrombus. Normal compressibility and flow on color Doppler imaging. Femoral Vein: No evidence of thrombus. Normal compressibility, respiratory phasicity and response to augmentation. Popliteal Vein: No evidence of thrombus. Normal compressibility, respiratory phasicity and response to augmentation. Calf Veins: No evidence of thrombus. Normal compressibility and flow on color Doppler imaging.Limited visualization of peroneal veins. Superficial Great Saphenous Vein: No evidence of thrombus. Normal compressibility. Other Findings:  None. IMPRESSION: No evidence of deep venous thrombosis in either lower extremity. Electronically Signed   By: Miachel Roux M.D.   On: 08/17/2020 10:08   DG Chest Port 1 View  Result Date: 08/20/2020 CLINICAL DATA:  Shortness of breath. EXAM: PORTABLE CHEST 1 VIEW COMPARISON:  August 19, 2020 FINDINGS: The support devices appear relatively stable from prior study. Bilateral hazy airspace opacities are again noted. There is no definite pneumothorax. The heart size remains stable. There is a dense retrocardiac airspace opacity. There are probable bilateral pleural effusions. IMPRESSION: 1. Stable support devices. 2. Persistent bilateral hazy airspace opacities and probable bilateral pleural effusions. Electronically Signed   By: Constance Holster M.D.   On: 08/20/2020 04:33   DG Chest Port 1 View  Result Date: 08/19/2020 CLINICAL DATA:  70 year old male COVID-19.  Intubated. EXAM: PORTABLE CHEST 1 VIEW COMPARISON:  Portable chest 08/18/2020 and earlier. FINDINGS: Portable AP upright view at 0537 hours. Stable lines and tubes. Left IJ dual lumen catheter again noted. Increased veiling opacity in the right lower lung and increased dense retrocardiac opacity obscuring some of the left hemidiaphragm. Elsewhere coarse and indistinct pulmonary opacity is stable. No pneumothorax identified. Mediastinal contours remain normal. Paucity  of bowel gas in the upper abdomen. IMPRESSION: 1. Evidence of small to moderate new right pleural effusion, increasing bilateral lower lobe collapse or consolidation. Underlying bilateral COVID-19 pneumonia suspected. 2. Stable lines and tubes. Electronically Signed   By: Genevie Ann M.D.   On: 08/19/2020 07:43   DG Chest Port 1 View  Result Date: 08/18/2020 CLINICAL DATA:  Endotracheal tube EXAM: PORTABLE CHEST 1 VIEW COMPARISON:  August 16, 2020 FINDINGS: The endotracheal tube terminates above the carina. The dialysis catheter is stable in positioning. The enteric tube extends below the left hemidiaphragm. Hazy airspace opacities are noted bilaterally which have worsened since the prior study, especially in the left lower lobe. There is no pneumothorax. There may be small bilateral pleural effusions. IMPRESSION: 1. Lines and tubes as above. 2. Worsening bilateral hazy  airspace opacities, especially in the left lower lobe. Electronically Signed   By: Constance Holster M.D.   On: 08/18/2020 06:03   DG Chest Port 1 View  Result Date: 08/16/2020 CLINICAL DATA:  Central line placement EXAM: PORTABLE CHEST 1 VIEW COMPARISON:  08/15/2020 FINDINGS: Interval placement of left neck vascular catheter, tip positioned over the superior SVC. Otherwise unchanged AP portable chest radiograph, support apparatus including endotracheal tube and esophagogastric tube, with patchy bilateral heterogeneous airspace opacity. The heart and mediastinum are unremarkable. IMPRESSION: 1. Interval placement of left neck vascular catheter, tip positioned over the superior SVC. 2. Otherwise unchanged AP portable chest radiograph, support apparatus including endotracheal tube and esophagogastric tube, with patchy bilateral heterogeneous airspace opacity. Electronically Signed   By: Eddie Candle M.D.   On: 08/16/2020 10:10   DG Chest Port 1 View  Result Date: 08/15/2020 CLINICAL DATA:  Acute respiratory failure with hypoxia EXAM: PORTABLE CHEST 1 VIEW COMPARISON:  08/14/2020 FINDINGS: Endotracheal tube and NG tube are unchanged. Bibasilar atelectasis or infiltrates. No effusions. Heart is normal size. No acute bony abnormality. IMPRESSION: Bibasilar atelectasis or infiltrates. Electronically Signed   By: Rolm Baptise M.D.   On: 08/15/2020 03:01   DG Chest Port 1 View  Result Date: 08/14/2020 CLINICAL DATA:  Endotracheal tube placement EXAM: PORTABLE CHEST 1 VIEW COMPARISON:  August 13, 2020 FINDINGS: The endotracheal tube terminates approximately 6 cm above the carina. The enteric tube extends below the left hemidiaphragm. There is no definite pneumothorax or large pleural effusion. There are increasing airspace opacities at the left lung base and in the retrocardiac region. The heart size is stable. Aortic calcifications are noted. IMPRESSION: 1. Lines and tubes as above. 2. Increasing airspace  opacities at the left lung base and within the retrocardiac region suspicious for atelectasis or developing pneumonia. Electronically Signed   By: Constance Holster M.D.   On: 08/14/2020 00:24   DG Chest Port 1 View  Result Date: 08/16/2020 CLINICAL DATA:  Attempted central line placement. Assess for pneumothorax EXAM: PORTABLE CHEST 1 VIEW COMPARISON:  08/23/2020 FINDINGS: Endotracheal tube is 7.6 cm above the carina. NG tube is in the stomach. No visible pneumothorax. Heart is normal size. Bibasilar airspace opacities. No effusions. IMPRESSION: No visible pneumothorax. Bibasilar atelectasis or infiltrates. Electronically Signed   By: Rolm Baptise M.D.   On: 08/31/2020 21:51   DG Chest Portable 1 View  Result Date: 08/08/2020 CLINICAL DATA:  Hypoxia.  Reported nasogastric tube placement EXAM: PORTABLE CHEST 1 VIEW COMPARISON:  August 13, 2020 study obtained earlier in the day FINDINGS: A nasogastric tube is not appreciable on current examination. Endotracheal tube tip is 6.8 cm above the carina. No  pneumothorax. Lungs are clear. Heart size and pulmonary vascularity are normal. No adenopathy. There is degenerative change in the thoracic spine. IMPRESSION: Endotracheal tube as described without pneumothorax. No nasogastric tube apparent. Lungs clear. Cardiac silhouette normal. Electronically Signed   By: Lowella Grip III M.D.   On: 09/03/2020 12:59   DG Chest Portable 1 View  Result Date: 08/28/2020 CLINICAL DATA:  Chest pain shortness of breath. Recent COVID-19 diagnosis. EXAM: PORTABLE CHEST 1 VIEW COMPARISON:  03/03/2011 FINDINGS: Midline trachea. Borderline cardiomegaly. No pleural effusion or pneumothorax. Suspect mild left base scarring. Clear right lung. IMPRESSION: Borderline cardiomegaly, without acute disease. Electronically Signed   By: Abigail Miyamoto M.D.   On: 08/20/2020 10:24    Assessment & Plan    A. fib-currently in sinus rhythm.  Has been converted to amiodarone 200 mg twice  daily per tube and remains on IV Cardizem and heparin.  We will continue with this and follow-up.  Signed, Javier Docker Giovanna Kemmerer MD 08/20/2020, 2:51 PM  Pager: [phone]

## 2020-08-20 NOTE — Progress Notes (Signed)
Assisted tele visit to patient with daughter. ° °Shia Delaine P, RN  °

## 2020-08-20 NOTE — Progress Notes (Signed)
[address] [place], [place] [address] Phone [phone]. Fax [phone]  Date: 08/20/2020                  Patient Name:  Nathaniel Thomas  MRN: [id number]  DOB: 06-Nov-1950  Age / Sex: 70 y.o., male         PCP: Dion Body, MD                  Presenting Illness: Patient is a 70 y.o. male  admitted to [facility] on 08/28/2020 for evaluation of Metabolic acidosis [K80.0] Encounter for central line placement [Z45.2] Acute renal failure (ARF) ([place]) [N17.9] Acute respiratory failure with hypoxia ([place]) [J96.01] Sepsis, due to unspecified organism, unspecified whether acute organ dysfunction present ([place]) [A41.9] COVID-19 [U07.1]   Patient presented to the ER via EMS from home for altered mental status.  Recently treated for COVID.  Per triage notes, patient was prescribed antibiotics for UTI but is unable to take due to lethargy.  Upon arrival patient was responsive to painful stimuli. Also reported to have nausea vomiting and diarrhea for several days prior to admission.  Initial evaluation showed severe acidosis with elevated blood sugars.  Also noted to have hypothermia.  He was intubated for severe acute respiratory failure.  Initially on presentation, pH of 6.99. Patient was treated with ivermectin as outpatient for COVID.  He is unvaccinated.  Hospital course:  1/14- now on amiodarone, vasopressin. Ketamine, fentanyl, phenylephrine. UOP remains poor.  1/15-  Remains critically ill.  Still on the vent. Due for another dialysis session today. Poor urine output.  1/16-patient underwent hemodialysis yesterday.  Remains critically ill.  Still on the ventilator.  Remains oliguric.  1/17- patient continues to have significant renal dysfunction.  Urine output only 225 cc over the preceding 24 hours.  Still on the ventilator.  01/16 0701 - 01/17 0700 In: 3828.6 [I.V.:3288.7; NG/GT:240; IV Piggyback:299.9] Out: 225 [Urine:225]    Vital Signs: Blood pressure 138/71, pulse  86, temperature 98.96 F (37.2 C), resp. rate (!) 25, height 6' 4"  (1.93 m), weight 106.2 kg, SpO2 96 %.   Intake/Output Summary (Last 24 hours) at 08/20/2020 1457 Last data filed at 08/20/2020 1213 Gross per 24 hour  Intake 4424.14 ml  Output 85 ml  Net 4339.14 ml    Weight trends: Filed Weights   08/15/20 0500 08/17/20 0433 08/20/20 0500  Weight: 90.9 kg 94.3 kg 106.2 kg    Physical Exam: General:  Critically ill-appearing  HEENT  ET tube in place  Lungs:  Ventilator assisted, FiO2 60%  Heart::  Regular  Abdomen:  Nondistended  Extremities:  1+ edema  Neurologic:  Sedated  Skin:  Warm, dry  Access:  Left IJ temp Cath 1/13- ICU team  Foley:  In place       Lab results: Basic Metabolic Panel: Recent Labs  Lab 08/18/20 0429 08/19/20 0302 08/19/20 0315 08/19/20 2101 08/20/20 0319  NA 140  --  142 141 139  K 4.7  --  4.1 4.9 4.9  CL 108  --  102 100 102  CO2 16*  --  24 22 21*  GLUCOSE 210*  --  122* 154* 116*  BUN 118*  --  106* 142* 147*  CREATININE 6.13*  --  5.28* 5.82* 5.93*  CALCIUM 6.1*  --  6.2* 6.2* 6.4*  MG 2.2 1.9  --   --  2.0  PHOS 6.6*  --  5.2*  --  7.5*    Liver  Function Tests: Recent Labs  Lab 08/20/20 0319  ALBUMIN 1.6*   No results for input(s): LIPASE, AMYLASE in the last 168 hours. No results for input(s): AMMONIA in the last 168 hours.  CBC: Recent Labs  Lab 08/19/20 0302 08/20/20 0319  WBC 21.2* 17.4*  HGB 11.4* 11.6*  HCT 32.1* 31.7*  MCV 84.3 80.7  PLT 119* 100*    Cardiac Enzymes: No results for input(s): CKTOTAL, TROPONINI in the last 168 hours.  BNP: Invalid input(s): POCBNP  CBG: Recent Labs  Lab 08/19/20 1952 08/20/20 0000 08/20/20 0358 08/20/20 0808 08/20/20 1059  GLUCAP 153* 142* 115* 87 82    Microbiology: Recent Results (from the past 720 hour(s))  Blood Culture (routine x 2)     Status: None   Collection Time: 08/10/2020  9:50 AM   Specimen: BLOOD  Result Value Ref Range Status   Specimen  Description BLOOD LEFT AC  Final   Special Requests   Final    BOTTLES DRAWN AEROBIC AND ANAEROBIC Blood Culture adequate volume   Culture   Final    NO GROWTH 5 DAYS Performed at [facility], [address] [place], [place] [address]    Report Status 08/18/2020 FINAL  Final  Blood Culture (routine x 2)     Status: None   Collection Time: 08/08/2020  9:50 AM   Specimen: BLOOD  Result Value Ref Range Status   Specimen Description BLOOD RIGHT AC  Final   Special Requests   Final    BOTTLES DRAWN AEROBIC AND ANAEROBIC Blood Culture results may not be optimal due to an excessive volume of blood received in culture bottles   Culture   Final    NO GROWTH 5 DAYS Performed at [facility], [address], [place], [place] [address]    Report Status 08/18/2020 FINAL  Final  Urine culture     Status: None   Collection Time: 08/29/2020 11:36 AM   Specimen: Urine, Random  Result Value Ref Range Status   Specimen Description   Final    URINE, RANDOM Performed at [facility], [address], [place], [place] [address]    Special Requests   Final    NONE Performed at [facility], [address], [place], [place] [address]    Culture   Final    NO GROWTH Performed at [facility], [place] [address], [place], [place] [address]    Report Status 08/14/2020 FINAL  Final  Culture, respiratory     Status: None   Collection Time: 08/17/20  3:15 PM   Specimen: Tracheal Aspirate; Respiratory  Result Value Ref Range Status   Specimen Description   Final    TRACHEAL ASPIRATE Performed at [facility], [address] [place], [place] [address]    Special Requests   Final    NONE Performed at [facility], [place], [place] [address]    Gram Stain NO WBC SEEN MODERATE GRAM VARIABLE ROD   Final   Culture   Final    ABUNDANT SERRATIA MARCESCENS SUSCEPTIBILITIES PERFORMED ON PREVIOUS CULTURE WITHIN THE LAST 5  DAYS. Performed at [facility], [place] [address] [place], [place] [address]    Report Status 08/20/2020 FINAL  Final  Culture, respiratory     Status: None   Collection Time: 08/18/20 11:09 AM   Specimen: Tracheal Aspirate; Respiratory  Result Value Ref Range Status   Specimen Description   Final  TRACHEAL ASPIRATE Performed at [facility], [address] [place], [place] [address]    Special Requests   Final    NONE Performed at [facility], [place], [place] [address]    Gram Stain   Final    MODERATE WBC PRESENT, PREDOMINANTLY PMN MODERATE GRAM NEGATIVE RODS FEW GRAM VARIABLE ROD RARE GRAM POSITIVE COCCI Performed at [facility], [place] [address] [place], [place] [address]    Culture ABUNDANT SERRATIA MARCESCENS  Final   Report Status 08/20/2020 FINAL  Final   Organism ID, Bacteria SERRATIA MARCESCENS  Final      Susceptibility   Serratia marcescens - MIC*    CEFAZOLIN >=64 RESISTANT Resistant     CEFEPIME <=0.12 SENSITIVE Sensitive     CEFTAZIDIME <=1 SENSITIVE Sensitive     CEFTRIAXONE <=0.25 SENSITIVE Sensitive     CIPROFLOXACIN <=0.25 SENSITIVE Sensitive     GENTAMICIN <=1 SENSITIVE Sensitive     TRIMETH/SULFA <=20 SENSITIVE Sensitive     * ABUNDANT SERRATIA MARCESCENS  MRSA PCR Screening     Status: None   Collection Time: 08/18/20 12:52 PM   Specimen: Nasopharyngeal  Result Value Ref Range Status   MRSA by PCR NEGATIVE NEGATIVE Final    Comment:        The GeneXpert MRSA Assay (FDA approved for NASAL specimens only), is one component of a comprehensive MRSA colonization surveillance program. It is not intended to diagnose MRSA infection nor to guide or monitor treatment for MRSA infections. Performed at [facility], [address] [place], [place] [address]      Coagulation Studies: No results for input(s): LABPROT, INR in the last 72 hours.  Urinalysis: No results for input(s):  COLORURINE, LABSPEC, PHURINE, GLUCOSEU, HGBUR, BILIRUBINUR, KETONESUR, PROTEINUR, UROBILINOGEN, NITRITE, LEUKOCYTESUR in the last 72 hours.  Invalid input(s): APPERANCEUR      Imaging: DG Chest Port 1 View  Result Date: 08/20/2020 CLINICAL DATA:  Shortness of breath. EXAM: PORTABLE CHEST 1 VIEW COMPARISON:  August 19, 2020 FINDINGS: The support devices appear relatively stable from prior study. Bilateral hazy airspace opacities are again noted. There is no definite pneumothorax. The heart size remains stable. There is a dense retrocardiac airspace opacity. There are probable bilateral pleural effusions. IMPRESSION: 1. Stable support devices. 2. Persistent bilateral hazy airspace opacities and probable bilateral pleural effusions. Electronically Signed   By: Constance Holster M.D.   On: 08/20/2020 04:33   DG Chest Port 1 View  Result Date: 08/19/2020 CLINICAL DATA:  70 year old male COVID-19.  Intubated. EXAM: PORTABLE CHEST 1 VIEW COMPARISON:  Portable chest 08/18/2020 and earlier. FINDINGS: Portable AP upright view at 0537 hours. Stable lines and tubes. Left IJ dual lumen catheter again noted. Increased veiling opacity in the right lower lung and increased dense retrocardiac opacity obscuring some of the left hemidiaphragm. Elsewhere coarse and indistinct pulmonary opacity is stable. No pneumothorax identified. Mediastinal contours remain normal. Paucity of bowel gas in the upper abdomen. IMPRESSION: 1. Evidence of small to moderate new right pleural effusion, increasing bilateral lower lobe collapse or consolidation. Underlying bilateral COVID-19 pneumonia suspected. 2. Stable lines and tubes. Electronically Signed   By: Genevie Ann M.D.   On: 08/19/2020 07:43   Scheduled Meds: . amiodarone  200 mg Per Tube BID  . chlorhexidine gluconate (MEDLINE KIT)  15 mL Mouth Rinse BID  . Chlorhexidine Gluconate Cloth  6 each Topical Daily  . docusate  100 mg Per Tube BID  .  feeding supplement (PROSource  TF)  90 mL Per Tube TID  . hydrocortisone sod succinate (SOLU-CORTEF) inj  50 mg Intravenous Q6H  . insulin aspart  0-20 Units Subcutaneous Q4H  . insulin aspart  6 Units Subcutaneous Q4H  . insulin detemir  53 Units Subcutaneous BID  . mouth rinse  15 mL Mouth Rinse 10 times per day  . metoprolol tartrate  12.5 mg Per NG tube BID  . sodium chloride flush  3 mL Intravenous Q12H   Continuous Infusions: . sodium chloride    . ceFEPime (MAXIPIME) IV Stopped (08/19/20 1907)  . dexmedetomidine (PRECEDEX) IV infusion 1 mcg/kg/hr (08/20/20 1213)  . diltiazem (CARDIZEM) infusion 12.5 mg/hr (08/20/20 1213)  . famotidine (PEPCID) IV Stopped (08/20/20 0930)  . feeding supplement (VITAL 1.5 CAL) 1,000 mL (08/20/20 0551)  . fentaNYL infusion INTRAVENOUS 250 mcg/hr (08/20/20 1213)  . heparin 1,300 Units/hr (08/20/20 1213)  . ketamine (KETALAR) Adult IV Infusion Stopped (08/19/20 0920)   PRN Meds:.sodium chloride, acetaminophen (TYLENOL) oral liquid 160 mg/5 mL, docusate, fentaNYL, heparin, metoprolol tartrate, midazolam, polyethylene glycol, sodium chloride flush, vecuronium   Assessment & Plan: Pt is a 70 y.o.   male with coronary artery disease, diabetes, glaucoma, history of kidney stones, hyperlipidemia, hypertension, left knee replacement, lithotripsy, basal cell carcinoma, was admitted on 03/23/6014 with Metabolic acidosis [I15.3] Encounter for central line placement [Z45.2] Acute renal failure (ARF) ([place]) [N17.9] Acute respiratory failure with hypoxia ([place]) [J96.01] Sepsis, due to unspecified organism, unspecified whether acute organ dysfunction present ([place]) [A41.9] COVID-19 [U07.1]   #Acute kidney injury Baseline creatinine 1.0 from August 02, 2020 Presenting creatinine of 2.15 Lab Results  Component Value Date   CREATININE 5.93 (H) 08/20/2020   CREATININE 5.82 (H) 08/19/2020   CREATININE 5.28 (H) 08/19/2020     #Acute respiratory failure Requiring ventilator support.   Intubated 08/14/2020  #COVID-19 pneumonia Treated with ivermectin as outpatient  #Diabetes type 2, insulin dependent Treated with metformin, Jardiance (empagliflozin), Amaryl as outpatient Hemoglobin A1c 7.9% from June 26, 2020 Lab Results  Component Value Date   HGBA1C 7.5 08/10/2017    #Severe acidosis Treated with dialysis    Plan: Oliguric acute kidney injury persist.  BUN still high at 147 with a creatinine of 5.93.  We will plan for hemodialysis tomorrow.  Patient to be maintained on ventilatory support.  FiO2 down to 60% at the moment.  However overall prognosis remains quite guarded.   LOS: 7 Daire Okimoto 1/17/20222:57 PM    Note: This note was prepared with Dragon dictation. Any transcription errors are unintentional

## 2020-08-20 NOTE — Progress Notes (Signed)
Pt noted with 2 episodes of guppy breathing thus far prn versed effective for short intervals. Breath sounds diminished Precedex drip started, continues on heparin, Cardizem, and fentanyl IV. Continues on ventilator for resp. support.

## 2020-08-21 ENCOUNTER — Inpatient Hospital Stay: Payer: HMO

## 2020-08-21 DIAGNOSIS — J9601 Acute respiratory failure with hypoxia: Secondary | ICD-10-CM | POA: Diagnosis not present

## 2020-08-21 DIAGNOSIS — N170 Acute kidney failure with tubular necrosis: Secondary | ICD-10-CM | POA: Diagnosis not present

## 2020-08-21 DIAGNOSIS — U071 COVID-19: Secondary | ICD-10-CM | POA: Diagnosis not present

## 2020-08-21 LAB — BASIC METABOLIC PANEL
Anion gap: 18 — ABNORMAL HIGH (ref 5–15)
BUN: 134 mg/dL — ABNORMAL HIGH (ref 8–23)
CO2: 23 mmol/L (ref 22–32)
Calcium: 6.6 mg/dL — ABNORMAL LOW (ref 8.9–10.3)
Chloride: 98 mmol/L (ref 98–111)
Creatinine, Ser: 5.42 mg/dL — ABNORMAL HIGH (ref 0.61–1.24)
GFR, Estimated: 11 mL/min — ABNORMAL LOW (ref >60)
Glucose, Bld: 50 mg/dL — ABNORMAL LOW (ref 70–99)
Potassium: 5.5 mmol/L — ABNORMAL HIGH (ref 3.5–5.1)
Sodium: 139 mmol/L (ref 135–145)

## 2020-08-21 LAB — GLUCOSE, CAPILLARY
Glucose-Capillary: 150 mg/dL — ABNORMAL HIGH (ref 70–99)
Glucose-Capillary: 124 mg/dL — ABNORMAL HIGH (ref 70–99)
Glucose-Capillary: 87 mg/dL (ref 70–99)
Glucose-Capillary: 51 mg/dL — ABNORMAL LOW (ref 70–99)
Glucose-Capillary: 103 mg/dL — ABNORMAL HIGH (ref 70–99)
Glucose-Capillary: 38 mg/dL — CL (ref 70–99)
Glucose-Capillary: 61 mg/dL — ABNORMAL LOW (ref 70–99)

## 2020-08-21 LAB — FIBRIN DERIVATIVES D-DIMER (ARMC ONLY): Fibrin derivatives D-dimer (ARMC): 4622.69 ng/mL (FEU) — ABNORMAL HIGH (ref 0.00–499.00)

## 2020-08-21 LAB — HEPARIN LEVEL (UNFRACTIONATED)
Heparin Unfractionated: 0.27 IU/mL — ABNORMAL LOW (ref 0.30–0.70)
Heparin Unfractionated: 0.44 IU/mL (ref 0.30–0.70)

## 2020-08-21 LAB — RENAL FUNCTION PANEL
Albumin: 1.6 g/dL — ABNORMAL LOW (ref 3.5–5.0)
Anion gap: 20 — ABNORMAL HIGH (ref 5–15)
BUN: 178 mg/dL — ABNORMAL HIGH (ref 8–23)
CO2: 19 mmol/L — ABNORMAL LOW (ref 22–32)
Calcium: 6.3 mg/dL — CL (ref 8.9–10.3)
Chloride: 98 mmol/L (ref 98–111)
Creatinine, Ser: 6.64 mg/dL — ABNORMAL HIGH (ref 0.61–1.24)
GFR, Estimated: 8 mL/min — ABNORMAL LOW (ref >60)
Glucose, Bld: 123 mg/dL — ABNORMAL HIGH (ref 70–99)
Phosphorus: 9.2 mg/dL — ABNORMAL HIGH (ref 2.5–4.6)
Potassium: 6.3 mmol/L (ref 3.5–5.1)
Sodium: 137 mmol/L (ref 135–145)

## 2020-08-21 LAB — CBC
HCT: 26.9 % — ABNORMAL LOW (ref 39.0–52.0)
Hemoglobin: 10 g/dL — ABNORMAL LOW (ref 13.0–17.0)
MCH: 29.7 pg (ref 26.0–34.0)
MCHC: 37.2 g/dL — ABNORMAL HIGH (ref 30.0–36.0)
MCV: 79.8 fL — ABNORMAL LOW (ref 80.0–100.0)
Platelets: 112 10*3/uL — ABNORMAL LOW (ref 150–400)
RBC: 3.37 MIL/uL — ABNORMAL LOW (ref 4.22–5.81)
RDW: 14.3 % (ref 11.5–15.5)
WBC: 25.6 10*3/uL — ABNORMAL HIGH (ref 4.0–10.5)
nRBC: 0 % (ref 0.0–0.2)

## 2020-08-21 LAB — MAGNESIUM: Magnesium: 2.2 mg/dL (ref 1.7–2.4)

## 2020-08-21 LAB — FERRITIN: Ferritin: 1450 ng/mL — ABNORMAL HIGH (ref 24–336)

## 2020-08-21 LAB — C-REACTIVE PROTEIN: CRP: 18 mg/dL — ABNORMAL HIGH (ref <1.0)

## 2020-08-21 IMAGING — DX DG ABDOMEN 1V
1 series · 1 of 1 positions shown · non-contrast
Comparison: Portable exam [7I] hours compared to [DATE]

CLINICAL DATA: Orogastric tube placement

EXAM:
ABDOMEN - 1 VIEW

[abdomen supine]
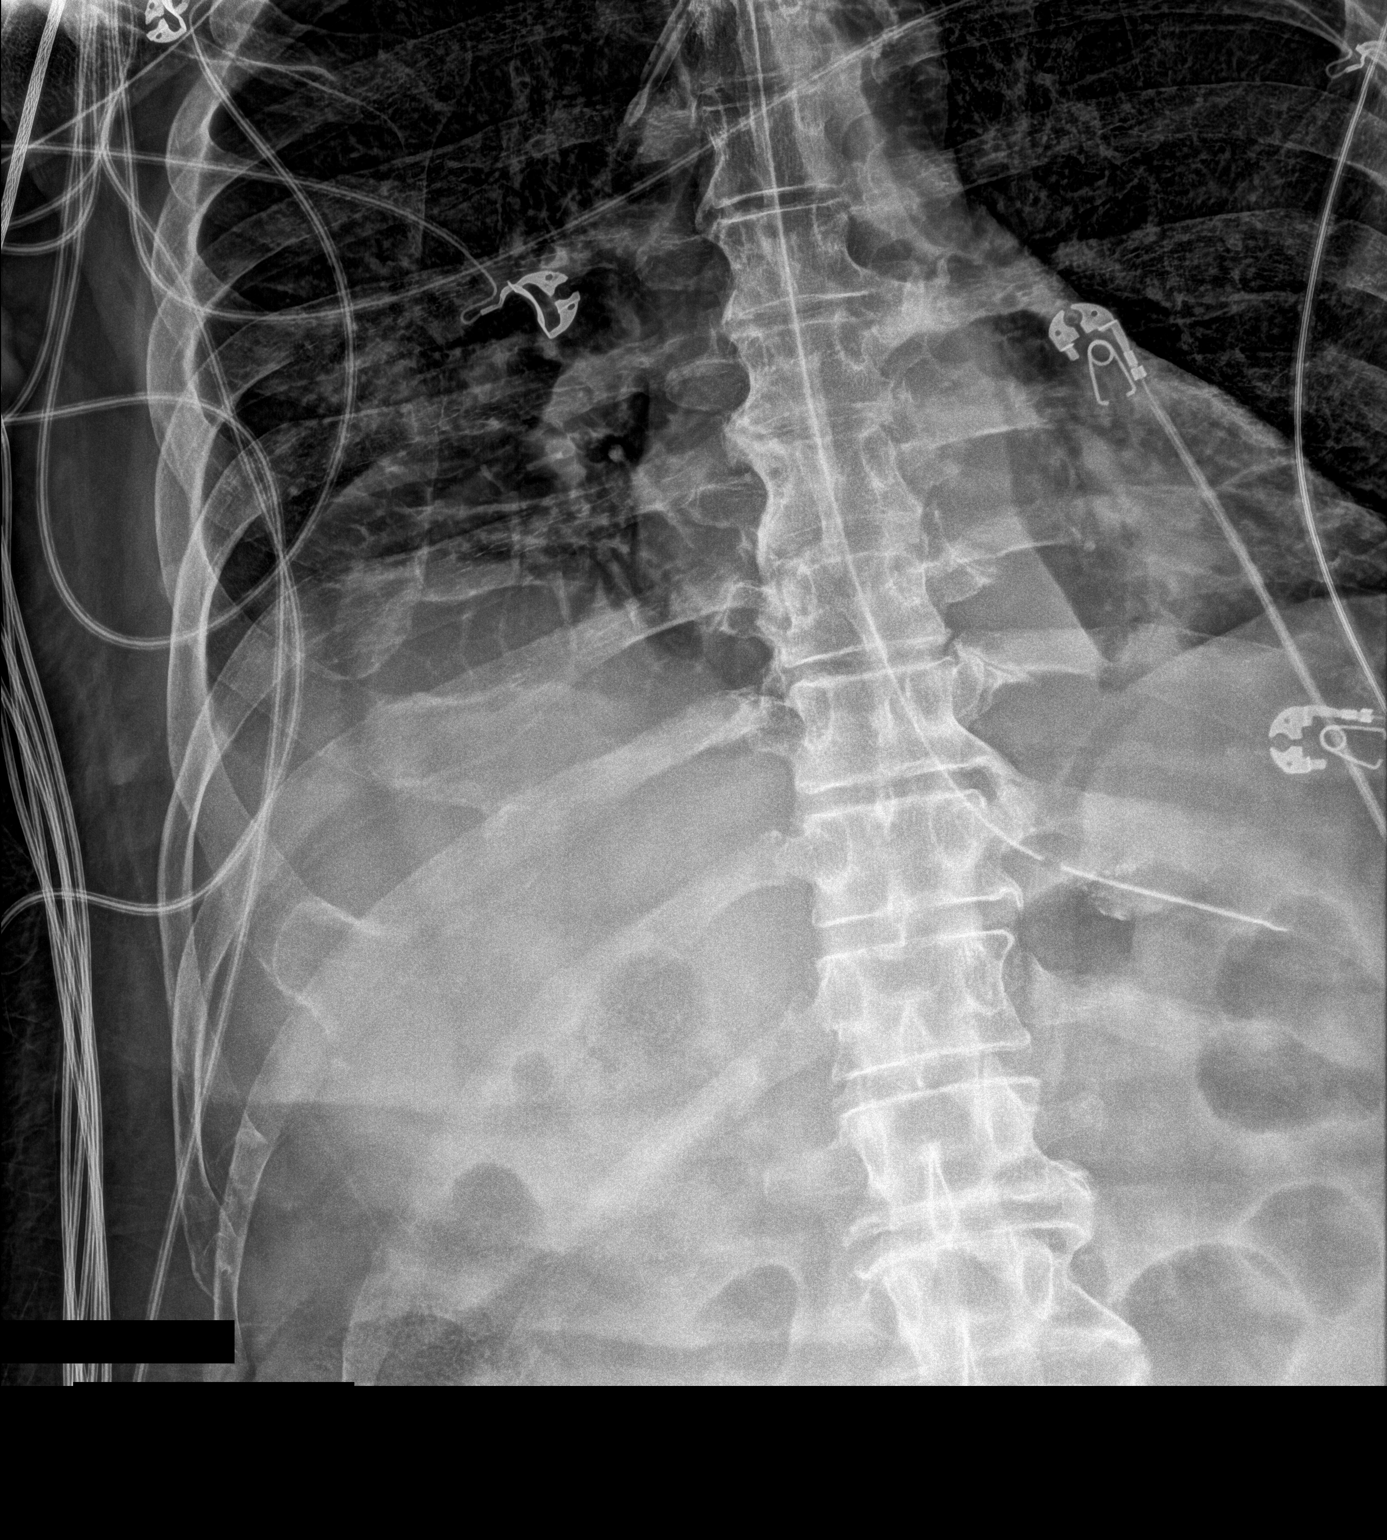

[1 of 1 positions shown; findings below may reference images not displayed]

FINDINGS: Tip of orogastric tube projects over stomach.

RIGHT basilar opacity question pneumonia versus atelectasis.

Nonobstructive bowel gas pattern.

Osseous structures unremarkable.
IMPRESSION: Orogastric tube projects over proximal stomach

RIGHT basilar opacity question atelectasis versus pneumonia.

## 2020-08-21 MED ORDER — CALCIUM GLUCONATE-NACL 1-0.675 GM/50ML-% IV SOLN
1.0000 g | Freq: Once | INTRAVENOUS | Status: AC
  Administered 2020-08-22: 1000 mg via INTRAVENOUS
  Filled 2020-08-21: qty 50

## 2020-08-21 MED ORDER — PROSOURCE TF PO LIQD
90.0000 mL | Freq: Two times a day (BID) | ORAL | Status: DC
  Administered 2020-08-21 – 2020-08-30 (×18): 90 mL
  Filled 2020-08-21: qty 90

## 2020-08-21 MED ORDER — CALCIUM GLUCONATE-NACL 1-0.675 GM/50ML-% IV SOLN
1.0000 g | Freq: Once | INTRAVENOUS | Status: AC
  Administered 2020-08-21: 1000 mg via INTRAVENOUS
  Filled 2020-08-21: qty 50

## 2020-08-21 MED ORDER — DEXTROSE 10 % IV SOLN: INTRAVENOUS | Status: DC

## 2020-08-21 MED ORDER — PATIROMER SORBITEX CALCIUM 8.4 G PO PACK
16.8000 g | PACK | Freq: Once | ORAL | Status: AC
  Administered 2020-08-21: 16.8 g via ORAL
  Filled 2020-08-21: qty 2

## 2020-08-21 MED ORDER — DEXTROSE 50 % IV SOLN
1.0000 | Freq: Once | INTRAVENOUS | Status: AC
  Administered 2020-08-21: 50 mL via INTRAVENOUS
  Filled 2020-08-21: qty 50

## 2020-08-21 MED ORDER — INSULIN ASPART 100 UNIT/ML ~~LOC~~ SOLN
10.0000 [IU] | Freq: Once | SUBCUTANEOUS | Status: AC
  Administered 2020-08-21: 10 [IU] via INTRAVENOUS
  Filled 2020-08-21: qty 0.1

## 2020-08-21 MED ORDER — PATIROMER SORBITEX CALCIUM 8.4 G PO PACK
16.8000 g | PACK | Freq: Once | ORAL | Status: AC
  Administered 2020-08-22: 16.8 g via ORAL
  Filled 2020-08-21: qty 2

## 2020-08-21 MED ORDER — NEPRO/CARBSTEADY PO LIQD
1000.0000 mL | ORAL | Status: DC
  Administered 2020-08-21: 1000 mL

## 2020-08-21 MED ORDER — DEXTROSE 50 % IV SOLN
25.0000 g | INTRAVENOUS | Status: AC
  Administered 2020-08-21: 25 g via INTRAVENOUS
  Filled 2020-08-21: qty 50

## 2020-08-21 MED ORDER — RENA-VITE PO TABS
1.0000 | ORAL_TABLET | Freq: Every day | ORAL | Status: DC
  Administered 2020-08-21 – 2020-08-29 (×9): 1
  Filled 2020-08-21 (×9): qty 1

## 2020-08-21 NOTE — Progress Notes (Signed)
[address] [place], [place] [address] Phone [phone]. Fax [phone]  Date: 08/21/2020                  Patient Name:  Nathaniel Thomas  MRN: [id number]  DOB: Feb 26, 1951  Age / Sex: 70 y.o., male         PCP: Dion Body, MD                  Presenting Illness: Patient is a 70 y.o. male  admitted to [facility] on 08/26/2020 for evaluation of Metabolic acidosis [Q03.4] Encounter for central line placement [Z45.2] Acute renal failure (ARF) ([place]) [N17.9] Acute respiratory failure with hypoxia ([place]) [J96.01] Sepsis, due to unspecified organism, unspecified whether acute organ dysfunction present ([place]) [A41.9] COVID-19 [U07.1]   Patient presented to the ER via EMS from home for altered mental status.  Recently treated for COVID.  Per triage notes, patient was prescribed antibiotics for UTI but is unable to take due to lethargy.  Upon arrival patient was responsive to painful stimuli. Also reported to have nausea vomiting and diarrhea for several days prior to admission.  Initial evaluation showed severe acidosis with elevated blood sugars.  Also noted to have hypothermia.  He was intubated for severe acute respiratory failure.  Initially on presentation, pH of 6.99. Patient was treated with ivermectin as outpatient for COVID.  He is unvaccinated.  Hospital course:  1/14- now on amiodarone, vasopressin. Ketamine, fentanyl, phenylephrine. UOP remains poor.  1/15-  Remains critically ill.  Still on the vent. Due for another dialysis session today. Poor urine output.  1/16-patient underwent hemodialysis yesterday.  Remains critically ill.  Still on the ventilator.  Remains oliguric.  1/17- patient continues to have significant renal dysfunction.  Urine output only 225 cc over the preceding 24 hours.  Still on the ventilator. 1/18- critical illness persist.  Patient still requiring vent support.  Patient due for dialysis treatment today.  Urine output remains diminished at 175  cc over the preceding 24 hours.  01/17 0701 - 01/18 0700 In: 1502.7 [I.V.:1452.7; IV Piggyback:50] Out: 175 [Urine:175]    Vital Signs: Blood pressure (!) 134/55, pulse 99, temperature (!) 101.12 F (38.4 C), resp. rate (!) 31, height _0  (1.93 m), weight 106.9 kg, SpO2 90 %.   Intake/Output Summary (Last 24 hours) at 08/21/2020 1747 Last data filed at 08/21/2020 1246 Gross per 24 hour  Intake 602.31 ml  Output 1075 ml  Net -472.69 ml    Weight trends: Filed Weights   08/17/20 0433 08/20/20 0500 08/21/20 0410  Weight: 94.3 kg 106.2 kg 106.9 kg    Physical Exam: General:  Critically ill-appearing  HEENT  ET tube in place  Lungs:  Ventilator assisted, FiO2 60%  Heart::  Regular  Abdomen:  Nondistended  Extremities:  1+ edema  Neurologic:  Sedated  Skin:  Warm, dry  Access:  Left IJ temp Cath 1/13- ICU team  Foley:  In place       Lab results: Basic Metabolic Panel: Recent Labs  Lab 08/19/20 0302 08/19/20 0315 08/19/20 2101 08/20/20 0319 08/21/20 0400  NA  --  142 141 139 137  K  --  4.1 4.9 4.9 6.3*  CL  --  102 100 102 98  CO2  --  24 22 21* 19*  GLUCOSE  --  122* 154* 116* 123*  BUN  --  106* 142* 147* 178*  CREATININE  --  5.28* 5.82* 5.93* 6.64*  CALCIUM  --  6.2* 6.2* 6.4* 6.3*  MG 1.9  --   --  2.0 2.2  PHOS  --  5.2*  --  7.5* 9.2*    Liver Function Tests: Recent Labs  Lab 08/21/20 0400  ALBUMIN 1.6*   No results for input(s): LIPASE, AMYLASE in the last 168 hours. No results for input(s): AMMONIA in the last 168 hours.  CBC: Recent Labs  Lab 08/20/20 0319 08/21/20 0400  WBC 17.4* 25.6*  HGB 11.6* 10.0*  HCT 31.7* 26.9*  MCV 80.7 79.8*  PLT 100* 112*    Cardiac Enzymes: No results for input(s): CKTOTAL, TROPONINI in the last 168 hours.  BNP: Invalid input(s): POCBNP  CBG: Recent Labs  Lab 08/21/20 0318 08/21/20 0749 08/21/20 1157 08/21/20 1508 08/21/20 1542  GLUCAP 150* 124* 87 51* 103*    Microbiology: Recent  Results (from the past 720 hour(s))  Blood Culture (routine x 2)     Status: None   Collection Time: 08/29/2020  9:50 AM   Specimen: BLOOD  Result Value Ref Range Status   Specimen Description BLOOD LEFT AC  Final   Special Requests   Final    BOTTLES DRAWN AEROBIC AND ANAEROBIC Blood Culture adequate volume   Culture   Final    NO GROWTH 5 DAYS Performed at [facility], [place] [place], [place] [address]    Report Status 08/18/2020 FINAL  Final  Blood Culture (routine x 2)     Status: None   Collection Time: 08/20/2020  9:50 AM   Specimen: BLOOD  Result Value Ref Range Status   Specimen Description BLOOD RIGHT AC  Final   Special Requests   Final    BOTTLES DRAWN AEROBIC AND ANAEROBIC Blood Culture results may not be optimal due to an excessive volume of blood received in culture bottles   Culture   Final    NO GROWTH 5 DAYS Performed at [facility], [address] [place], [place] [address]    Report Status 08/18/2020 FINAL  Final  Urine culture     Status: None   Collection Time: 08/05/2020 11:36 AM   Specimen: Urine, Random  Result Value Ref Range Status   Specimen Description   Final    URINE, RANDOM Performed at [facility], [address], [place], [place] [address]    Special Requests   Final    NONE Performed at [facility], [address] [place], [place] [address]    Culture   Final    NO GROWTH Performed at [facility], [place] [address] [place], [place] [address]    Report Status 08/14/2020 FINAL  Final  Culture, respiratory     Status: None   Collection Time: 08/17/20  3:15 PM   Specimen: Tracheal Aspirate; Respiratory  Result Value Ref Range Status   Specimen Description   Final    TRACHEAL ASPIRATE Performed at [facility], [address] [place], [place] [address]    Special Requests   Final    NONE Performed at [facility], [place], [place] [address]     Gram Stain NO WBC SEEN MODERATE GRAM VARIABLE ROD   Final   Culture   Final    ABUNDANT SERRATIA MARCESCENS SUSCEPTIBILITIES PERFORMED ON PREVIOUS CULTURE WITHIN THE LAST 5 DAYS. Performed at [facility], [place] [address] [place],  [address]    Report Status 08/20/2020 FINAL  Final  Culture, respiratory  Status: None   Collection Time: 08/18/20 11:09 AM   Specimen: Tracheal Aspirate; Respiratory  Result Value Ref Range Status   Specimen Description   Final    TRACHEAL ASPIRATE Performed at [facility], [place] [place], [place] [address]    Special Requests   Final    NONE Performed at [facility], [place], [place] [address]    Gram Stain   Final    MODERATE WBC PRESENT, PREDOMINANTLY PMN MODERATE GRAM NEGATIVE RODS FEW GRAM VARIABLE ROD RARE GRAM POSITIVE COCCI Performed at [facility], [place] [address] [place], [place] [address]    Culture ABUNDANT SERRATIA MARCESCENS  Final   Report Status 08/20/2020 FINAL  Final   Organism ID, Bacteria SERRATIA MARCESCENS  Final      Susceptibility   Serratia marcescens - MIC*    CEFAZOLIN >=64 RESISTANT Resistant     CEFEPIME <=0.12 SENSITIVE Sensitive     CEFTAZIDIME <=1 SENSITIVE Sensitive     CEFTRIAXONE <=0.25 SENSITIVE Sensitive     CIPROFLOXACIN <=0.25 SENSITIVE Sensitive     GENTAMICIN <=1 SENSITIVE Sensitive     TRIMETH/SULFA <=20 SENSITIVE Sensitive     * ABUNDANT SERRATIA MARCESCENS  MRSA PCR Screening     Status: None   Collection Time: 08/18/20 12:52 PM   Specimen: Nasopharyngeal  Result Value Ref Range Status   MRSA by PCR NEGATIVE NEGATIVE Final    Comment:        The GeneXpert MRSA Assay (FDA approved for NASAL specimens only), is one component of a comprehensive MRSA colonization surveillance program. It is not intended to diagnose MRSA infection nor to guide or monitor treatment for MRSA infections. Performed at [facility], [place]., [place],  [address]      Coagulation Studies: No results for input(s): LABPROT, INR in the last 72 hours.  Urinalysis: No results for input(s): COLORURINE, LABSPEC, PHURINE, GLUCOSEU, HGBUR, BILIRUBINUR, KETONESUR, PROTEINUR, UROBILINOGEN, NITRITE, LEUKOCYTESUR in the last 72 hours.  Invalid input(s): APPERANCEUR      Imaging: DG Abd 1 View  Result Date: 08/21/2020 CLINICAL DATA:  Orogastric tube placement EXAM: ABDOMEN - 1 VIEW COMPARISON:  Portable exam 1437 hours compared to 08/18/2020 FINDINGS: Tip of orogastric tube projects over stomach. RIGHT basilar opacity question pneumonia versus atelectasis. Nonobstructive bowel gas pattern. Osseous structures unremarkable. IMPRESSION: Orogastric tube projects over proximal stomach RIGHT basilar opacity question atelectasis versus pneumonia. Electronically Signed   By: Lavonia Dana M.D.   On: 08/21/2020 14:46   DG Chest Port 1 View  Result Date: 08/20/2020 CLINICAL DATA:  Shortness of breath. EXAM: PORTABLE CHEST 1 VIEW COMPARISON:  August 19, 2020 FINDINGS: The support devices appear relatively stable from prior study. Bilateral hazy airspace opacities are again noted. There is no definite pneumothorax. The heart size remains stable. There is a dense retrocardiac airspace opacity. There are probable bilateral pleural effusions. IMPRESSION: 1. Stable support devices. 2. Persistent bilateral hazy airspace opacities and probable bilateral pleural effusions. Electronically Signed   By: Constance Holster M.D.   On: 08/20/2020 04:33   Scheduled Meds: . amiodarone  200 mg Per Tube BID  . chlorhexidine gluconate (MEDLINE KIT)  15 mL Mouth Rinse BID  . Chlorhexidine Gluconate Cloth  6 each Topical Daily  . docusate  100 mg Per Tube BID  . feeding supplement (PROSource TF)  90 mL Per Tube BID  . hydrocortisone sod succinate (SOLU-CORTEF) inj  50 mg Intravenous Q12H  .  insulin aspart  0-20 Units Subcutaneous Q4H  . insulin aspart   6 Units Subcutaneous Q4H  . insulin detemir  25 Units Subcutaneous BID  . mouth rinse  15 mL Mouth Rinse 10 times per day  . metoprolol tartrate  12.5 mg Per NG tube BID  . multivitamin  1 tablet Per Tube QHS  . sodium chloride flush  3 mL Intravenous Q12H   Continuous Infusions: . sodium chloride    . dexmedetomidine (PRECEDEX) IV infusion Stopped (08/20/20 2225)  . famotidine (PEPCID) IV 20 mg (08/21/20 0912)  . feeding supplement (NEPRO CARB STEADY) 1,000 mL (08/21/20 1659)  . fentaNYL infusion INTRAVENOUS 175 mcg/hr (08/21/20 1421)  . heparin 1,400 Units/hr (08/21/20 0636)  . midazolam 4 mg/hr (08/21/20 1720)   PRN Meds:.sodium chloride, acetaminophen (TYLENOL) oral liquid 160 mg/5 mL, docusate, fentaNYL, heparin, metoprolol tartrate, midazolam, polyethylene glycol, sodium chloride flush   Assessment & Plan: Pt is a 71 y.o.   male with coronary artery disease, diabetes, glaucoma, history of kidney stones, hyperlipidemia, hypertension, left knee replacement, lithotripsy, basal cell carcinoma, was admitted on 03/10/9995 with Metabolic acidosis [V22.7] Encounter for central line placement [Z45.2] Acute renal failure (ARF) ([place]) [N17.9] Acute respiratory failure with hypoxia ([place]) [J96.01] Sepsis, due to unspecified organism, unspecified whether acute organ dysfunction present ([place]) [A41.9] COVID-19 [U07.1]   #Acute kidney injury Baseline creatinine 1.0 from August 02, 2020 Presenting creatinine of 2.15 Lab Results  Component Value Date   CREATININE 6.64 (H) 08/21/2020   CREATININE 5.93 (H) 08/20/2020   CREATININE 5.82 (H) 08/19/2020     #Acute respiratory failure Requiring ventilator support.  Intubated 08/14/2020  #COVID-19 pneumonia Treated with ivermectin as outpatient  #Diabetes type 2, insulin dependent Treated with metformin, Jardiance (empagliflozin), Amaryl as outpatient Hemoglobin A1c 7.9% from June 26, 2020 Lab Results  Component Value Date   HGBA1C  7.5 08/10/2017    #Severe acidosis Treated with dialysis    Plan: Urine output was only 175 cc over the preceding 24 hours.  Therefore dialysis still indicated.  Patient due for hemodialysis treatment today.  Otherwise remains critically ill.  Still requiring ventilatory support at this time. Also noted to be febrile today.  Respiratory culture taken.  Otherwise continue supportive care.  Prognosis remains guarded.   LOS: 8 Jeri Rawlins 1/18/20225:47 PM    Note: This note was prepared with Dragon dictation. Any transcription errors are unintentional

## 2020-08-21 NOTE — Progress Notes (Signed)
ANTICOAGULATION CONSULT NOTE  Pharmacy Consult for heparin Indication: atrial fibrillation  Patient Measurements: Heparin Dosing Weight: 90 kg  Labs: Recent Labs    08/19/20 0302 08/19/20 0315 08/19/20 2101 08/19/20 2226 08/20/20 0319 08/21/20 0400  HGB 11.4*  --   --   --  11.6* 10.0*  HCT 32.1*  --   --   --  31.7* 26.9*  PLT 119*  --   --   --  100* 112*  HEPARINUNFRC 0.28*   < >  --  0.33 0.31 0.27*  CREATININE  --    < > 5.82*  --  5.93* 6.64*   < > = values in this interval not displayed.   Estimated Creatinine Clearance: 13.9 mL/min (A) (by C-G formula based on SCr of 6.64 mg/dL (H)).  Medical History: Past Medical History:  Diagnosis Date  . CAD (coronary artery disease)   . Cataract   . CHICKENPOX, HX OF 08/30/2008   Qualifier: Diagnosis of  By: [name], [name]    . Chronic kidney disease   . Diabetes mellitus   . Glaucoma   . History of urinary calculi 08/30/2008   Qualifier: Diagnosis of  By: [name], Jacelyn Pi   . Hyperlipidemia   . Hypertension   . Renal calculus or stone   . Skin cancer of nose    ears and hands   Assessment: 70 year old male admitted with severe DKA, which has since resolved and patient now on SQ insulin regimen. Patient with worsening renal function, plan to start HD 1/13. Patient with new afib 1/13, started on amiodarone and heparin.  -1/15 at 0429 HL = 0.42, therapeutic x 3. CBC is worse today, PLTs trending down, monitor closely.   Will continue heparin infusion at current rate of 1200 units/hr -0116 0302 HL 0.28, slightly SUBtherapeutic.  CBC low but stable now.  Will increase Heparin infusion to 1300 units/hr    Goal of Therapy:  Heparin level 0.3-0.7 units/ml Monitor platelets by anticoagulation protocol: Yes   Plan:   1/16 @1400   HL 0.36, therapeutic.  Hgb 11.4  Plt 119.  Will continue Heparin infusion at 1300 units/hr and check confirmatory HL in ~ 8 hrs.    Monitor CBC  1/16 @ 2226 HL 0.33, therapeutic x 2.  Will  continue Heparin infusion at 1300 units/hr and recheck HL in am.  Monitor CBC  1/17 @ 0319 HL 0.31, therapeutic x 3.  H/H stable, PLTs trending down.  Continue Heparin at current rate.  Monitor CBC daily per protocol.  1/18 @ 0400 HL 0.27, SUBtherapeutic.  PLTs improved, H/H slightly worse.  Will increase Heparin infusion to 1400 units/hr and recheck HL in ~ 8 hrs.  Monitor CBC.   Hart Robinsons A 08/21/2020,6:53 AM

## 2020-08-21 NOTE — Progress Notes (Signed)
ANTICOAGULATION CONSULT NOTE  Pharmacy Consult for heparin Indication: atrial fibrillation  Patient Measurements: Heparin Dosing Weight: 90 kg  Labs: Recent Labs    08/19/20 0302 08/19/20 0315 08/19/20 2101 08/19/20 2226 08/20/20 0319 08/21/20 0400 08/21/20 1510  HGB 11.4*  --   --   --  11.6* 10.0*  --   HCT 32.1*  --   --   --  31.7* 26.9*  --   PLT 119*  --   --   --  100* 112*  --   HEPARINUNFRC 0.28*   < >  --    < > 0.31 0.27* 0.44  CREATININE  --    < > 5.82*  --  5.93* 6.64*  --    < > = values in this interval not displayed.   Estimated Creatinine Clearance: 13.9 mL/min (A) (by C-G formula based on SCr of 6.64 mg/dL (H)).  Medical History: Past Medical History:  Diagnosis Date  . CAD (coronary artery disease)   . Cataract   . CHICKENPOX, HX OF 08/30/2008   Qualifier: Diagnosis of  By: [name], [name]    . Chronic kidney disease   . Diabetes mellitus   . Glaucoma   . History of urinary calculi 08/30/2008   Qualifier: Diagnosis of  By: [name], Jacelyn Pi   . Hyperlipidemia   . Hypertension   . Renal calculus or stone   . Skin cancer of nose    ears and hands   Assessment: 70 year old male admitted with severe DKA, which has since resolved and patient now on SQ insulin regimen. Patient with worsening renal function, plan to start HD 1/13. Patient with new afib 1/13, started on amiodarone and heparin.   Date Time HL Rate/comment 1/15 0429 0.42 @1200  units/hr - plts trending down 1/16 0302 0.28 @1200  units/hr 1/16 1400 0.36 @1300  units/hr 1/16 2226 0.33 @1300  units/hr 1/17 0319 0.31 @1300  units/hr - plts trending down 1/18 0400 0.27 @1300  units/hr - plts improved, H/H slightly worse 1/18 1510 0.44 @1400  units/hr - H/H trending down, plts stable  Goal of Therapy:  Heparin level 0.3-0.7 units/ml Monitor platelets by anticoagulation protocol: Yes   Plan:  Continue heparin infusion at 1400 units/hr Check HL in ~8 hours, monitor CBC, s/s of  bleed   Darnelle Bos, PharmD 08/21/2020,3:41 PM

## 2020-08-21 NOTE — Progress Notes (Signed)
NAME:  Nathaniel Thomas, MRN:  [id number], DOB:  [birth year], LOS: 8 ADMISSION DATE:  08/25/2020, CONSULTATION DATE:  08/25/20 REFERRING MD:  Raliegh Ip CHIEF COMPLAINT:  Respiratory failure  Brief History:  70 y.o.malew/ a past medical history significant for CAD, CKD, Type I I Diabetes, HLD and HTN who presented to the ED 1/10 after being found unresponsive at home. Per EMS he was hypoxic on arrival. Dx with COVID 1st week of Jan 2022. Per family, NVD for several days prior. ED workup revealed severe acidosis w/elevated blood sugars, hypothermic and he was emergently intubated for respiratory distress.    Past Medical History:  He,  has a past medical history of CAD (coronary artery disease), Cataract, CHICKENPOX, HX OF (08/30/2008), Chronic kidney disease, Diabetes mellitus, Glaucoma, History of urinary calculi (08/30/2008), Hyperlipidemia, Hypertension, Renal calculus or stone, and Skin cancer of nose.   Significant Hospital Events:  1/10 admitted to ICU, severe DKA, gastroentertitis, Intubated 1/13 severe resp failure, worsening Renal failure 1/14- Afib/RVR:  plan for cardiac cardoversion. Patient remains in shock with AFRVR on MV, medically optimizing for procedure.  1/15- patient improved slightly, rate controlled AF off Neo  08/19/20- vitals are improved, rate controlled.  BP normalized.  Ventilator weaned to FiO2 60% remains crtically ill.   08/10/20: Remains in NSR on Cardizem gtt, Amio 200mg  BID. Versed gtt started yesteday due to "guppy breathing" on vent. No distress with stable vital signs this AM. Will attempt to wean sedation today  Consults:  Cardiology Nephrology  Procedures:  08/16/20 CVL August 25, 2020 CVL 08/25/2020 ETT  Significant Diagnostic Tests:  CT Head 08/25/2020 Negative motion degraded head CT.  Micro Data:  Tracheal Aspirate 08/18/20 - Serratia Marcescens Tracheal Aspirate 08/21/20 - Repeat sent due to worsening leukocytosis and low grade fever   Antimicrobials:   Vancomycin 1/10--> discontinued Cefepime 1/10--> completed 1/17  Interim History / Subjective:  Tmax 37.7 I&Os: +1327 K 6.8 overnight and temporized. Plan is for dialysis today. Otherwise no acute events overnight Remains sedated on Fentanyl and Versed gtt Objective   Blood pressure (!) 115/57, pulse 82, temperature 100.04 F (37.8 C), resp. rate (!) 25, height 6\' 4"  (1.93 m), weight 106.9 kg, SpO2 94 %.    Vent Mode: PRVC FiO2 (%):  [60 %] 60 % Set Rate:  [22 bmp] 22 bmp Vt Set:  [520 mL] 520 mL PEEP:  [10 cmH20] 10 cmH20 Plateau Pressure:  [21 cmH20] 21 cmH20   Intake/Output Summary (Last 24 hours) at 08/21/2020 0848 Last data filed at 08/21/2020 0107 Gross per 24 hour  Intake 1502.66 ml  Output 145 ml  Net 1357.66 ml   Filed Weights   08/17/20 0433 08/20/20 0500 08/21/20 0410  Weight: 94.3 kg 106.2 kg 106.9 kg    Examination: General: chronically ill appearing male, sedated HENT: [place]/AT, ET tube in place, moist mucous membranes Lungs: Course breath sounds. No wheezing Cardiovascular: RRR, s1s2 Abdomen: soft, non-distended, BS+ Extremities: 1+ edema Neuro: sedated, not responsive on Versed gtt at 7mg /hr GU: foley in place with minimal dark amber output  Resolved Hospital Problem list     Assessment & Plan:  Acute hypoxemic Respiratory failure COVID 19 Pneumonia Serratia Marcesans pneumonia - Continue mechanical ventilation  - Wean Versed gtt today as tolerated - Completed Cefepime course - Repeat tracheal aspirate pending due to worsening leukocytosis and low grade temp - VAP Bundle  Acute on Chronic Renal failure - Nephrology following - Cr 6.64, BUN 128 this AM - Hyperkalemic overnight to 6.8 and  received temporizing medications - Plan is for dialysis today  Shock - Remains hemodynamically stable off vasopressors - Hydrocortisone decreased to 50mg  Q12 hour yesterday 1/17 from Q6hr - Continue to wean steroids  Acute Encephalopathy - Multifactorial,  on heavy sedation, renal failure  Atrial Fibrillation - Cardiology following - continue amiodarone - continue cardizem gtt - heparin drip  Type II Diabetes DKA- resolved --Blood sugars stable --Continue insulin regimen  Best practice (evaluated daily)  Diet: TF Pain/Anxiety/Delirium protocol (if indicated): fentanyl, versed VAP protocol (if indicated): ordered DVT prophylaxis: heparin GI prophylaxis: PPI  Glucose control: SSI Mobility: bed rest Disposition: ICU Code Status: Full code, I spoke w/patients daughter Marijean Niemann this AM to update her on patients condition. We further discussed DNR status that was discussed yesterday. She stated she would talk with her mother (the patients wife) today to further discuss and give [place] a decision.   Labs   CBC: Recent Labs  Lab 08/17/20 0835 08/18/20 0429 08/19/20 0302 08/20/20 0319 08/21/20 0400  WBC 22.9* 16.8* 21.2* 17.4* 25.6*  HGB 13.9 11.6* 11.4* 11.6* 10.0*  HCT 41.0 33.5* 32.1* 31.7* 26.9*  MCV 88.2 86.8 84.3 80.7 79.8*  PLT 188 123* 119* 100* 112*    Basic Metabolic Panel: Recent Labs  Lab 08/17/20 0439 08/18/20 0429 08/19/20 0302 08/19/20 0315 08/19/20 2101 08/20/20 0319 08/21/20 0400  NA 143 140  --  142 141 139 137  K 4.3 4.7  --  4.1 4.9 4.9 6.3*  CL 111 108  --  102 100 102 98  CO2 19* 16*  --  24 22 21* 19*  GLUCOSE 229* 210*  --  122* 154* 116* 123*  BUN 99* 118*  --  106* 142* 147* 178*  CREATININE 5.23* 6.13*  --  5.28* 5.82* 5.93* 6.64*  CALCIUM 5.9* 6.1*  --  6.2* 6.2* 6.4* 6.3*  MG 2.0 2.2 1.9  --   --  2.0 2.2  PHOS 5.9* 6.6*  --  5.2*  --  7.5* 9.2*   GFR: Estimated Creatinine Clearance: 13.9 mL/min (A) (by C-G formula based on SCr of 6.64 mg/dL (H)). Recent Labs  Lab 08/14/20 0957 08/15/20 0458 08/16/20 0416 08/18/20 0429 08/19/20 0302 08/20/20 0319 08/21/20 0400  PROCALCITON 3.00 5.11  --   --   --   --   --   WBC 34.7* 27.9*   < > 16.8* 21.2* 17.4* 25.6*   < > = values in this  interval not displayed.    Liver Function Tests: Recent Labs  Lab 08/17/20 0439 08/18/20 0429 08/19/20 0315 08/20/20 0319 08/21/20 0400  ALBUMIN 1.8* 1.7* 1.7* 1.6* 1.6*   No results for input(s): LIPASE, AMYLASE in the last 168 hours. No results for input(s): AMMONIA in the last 168 hours.  ABG    Component Value Date/Time   PHART 7.39 08/19/2020 0250   PCO2ART 41 08/19/2020 0250   PO2ART 97 08/19/2020 0250   HCO3 24.8 08/19/2020 0250   ACIDBASEDEF 0.2 08/19/2020 0250   O2SAT 97.5 08/19/2020 0250     Coagulation Profile: No results for input(s): INR, PROTIME in the last 168 hours.  Cardiac Enzymes: Recent Labs  Lab 08/16/20 1339  CKMB 28.3*    HbA1C: Hemoglobin A1C  Date/Time Value Ref Range Status  08/10/2017 11:53 AM 7.5  Final  04/09/2017 08:30 AM 7.3  Final   Hgb A1c MFr Bld  Date/Time Value Ref Range Status  01/11/2015 08:33 AM 7.5 (H) 4.6 - 6.5 % Final  Comment:    Glycemic Control Guidelines for People with Diabetes:Non Diabetic:  <6%Goal of Therapy: <7%Additional Action Suggested:  >8%   07/31/2014 03:27 PM 7.2 (H) 4.6 - 6.5 % Final    Comment:    Glycemic Control Guidelines for People with Diabetes:Non Diabetic:  <6%Goal of Therapy: <7%Additional Action Suggested:  >8%     CBG: Recent Labs  Lab 08/20/20 1710 08/20/20 1922 08/20/20 2321 08/21/20 0318 08/21/20 0749  GLUCAP 84 124* 149* 150* 124*    Critical care time: 53 minutes

## 2020-08-21 NOTE — Progress Notes (Signed)
Nutrition Follow-up  DOCUMENTATION CODES:   Non-severe (moderate) malnutrition in context of chronic illness  INTERVENTION:  Initiate new TF regimen of Nepro at 50 mL/hr (1200 mL goal daily volume) + PROSource TF 90 mL BID per tube. Provides 2320 kcal, 141 grams of protein, 876 mL H2O daily.  New goal TF regimen meets 100% RDIs for vitamins/minerals.  Provide Rena-vit QHS per tube to replace losses from HD.  NUTRITION DIAGNOSIS:   Moderate Malnutrition related to chronic illness (CKD) as evidenced by mild fat depletion,mild muscle depletion.  Ongoing.  GOAL:   Patient will meet greater than or equal to 90% of their needs  Met with TF regimen.  MONITOR:   Vent status,Labs,Weight trends,TF tolerance,I & O's  REASON FOR ASSESSMENT:   Ventilator,Consult Enteral/tube feeding initiation and management  ASSESSMENT:   70 year old male with PMHx of DM, HTN, HLD, glaucoma, CAD, CKD recently diagnosed with COVID-19 admitted with DKA, gastroenteritis.  1/10 intubated 1/11 transferred to ICU from ED  Patient is currently intubated on ventilator support MV: 13.4 L/min Temp (24hrs), Avg:99.6 F (37.6 C), Min:99.3 F (37.4 C), Max:100.04 F (37.8 C)  Medications reviewed and include: Colace 100 mg BID, Solu-Cortef 50 mg Q12hrs IV, Novolog 0-20 units Q4hrs, Novolog 6 units Q4hrs, Levemir 25 units BID, famotidine, fentanyl gtt, Versed gtt.  Labs reviewed: CBG 78-150, Potassium 6.3, CO2 19, BUN 178, Creatinine 6.64, Anion gap 20, Phosphorus 9.2.  I/O: 175 mL UOP yesterday  Weight trend: 106.9 kg on 1/18; +16 kg from 1/12; likely related to fluid retention as patient is +23993 mL since admission  Enteral Access: 16 Fr. OGT placed 1/10; terminates in stomach per abdominal x-ray 1/15 with side-port in region of GE junction with advancement of 6-8 cm recommended  Discussed with RN and on rounds. Patient receiving HD today. As patient with hyperkalemia will change to Nepro at this  time and continue to monitor. Patient is not requiring pressors at this time. Sent secure chat to RN and ICU NP regarding recommendation for advancement of OGT.  Diet Order:   Diet Order            Diet NPO time specified  Diet effective now                EDUCATION NEEDS:   No education needs have been identified at this time  Skin:  Skin Assessment: Skin Integrity Issues: Skin Integrity Issues:: DTI DTI: sacrum  Last BM:  08/18/2020 per chart  Height:   Ht Readings from Last 1 Encounters:  08/16/2020 6' 4"  (1.93 m)   Weight:   Wt Readings from Last 1 Encounters:  08/21/20 106.9 kg   Ideal Body Weight:  91.8 kg  BMI:  Body mass index is 28.69 kg/m.  Estimated Nutritional Needs:   Kcal:  2363  Protein:  135-145 grams  Fluid:  2.2 L/day  Jacklynn Barnacle, MS, RD, LDN Pager number available on Amion

## 2020-08-21 NOTE — Progress Notes (Signed)
Assisted tele visit to patient with daughter. ° °Jenascia Bumpass P, RN  °

## 2020-08-22 ENCOUNTER — Inpatient Hospital Stay: Payer: HMO

## 2020-08-22 DIAGNOSIS — E440 Moderate protein-calorie malnutrition: Secondary | ICD-10-CM | POA: Diagnosis not present

## 2020-08-22 DIAGNOSIS — G9340 Encephalopathy, unspecified: Secondary | ICD-10-CM

## 2020-08-22 DIAGNOSIS — N19 Unspecified kidney failure: Secondary | ICD-10-CM

## 2020-08-22 DIAGNOSIS — J9601 Acute respiratory failure with hypoxia: Secondary | ICD-10-CM

## 2020-08-22 DIAGNOSIS — N179 Acute kidney failure, unspecified: Secondary | ICD-10-CM

## 2020-08-22 DIAGNOSIS — U071 COVID-19: Secondary | ICD-10-CM | POA: Diagnosis not present

## 2020-08-22 LAB — GLUCOSE, CAPILLARY
Glucose-Capillary: 75 mg/dL (ref 70–99)
Glucose-Capillary: 99 mg/dL (ref 70–99)
Glucose-Capillary: 176 mg/dL — ABNORMAL HIGH (ref 70–99)
Glucose-Capillary: 245 mg/dL — ABNORMAL HIGH (ref 70–99)
Glucose-Capillary: 288 mg/dL — ABNORMAL HIGH (ref 70–99)
Glucose-Capillary: 249 mg/dL — ABNORMAL HIGH (ref 70–99)

## 2020-08-22 LAB — CBC
HCT: 29.6 % — ABNORMAL LOW (ref 39.0–52.0)
Hemoglobin: 11.1 g/dL — ABNORMAL LOW (ref 13.0–17.0)
MCH: 29.5 pg (ref 26.0–34.0)
MCHC: 37.5 g/dL — ABNORMAL HIGH (ref 30.0–36.0)
MCV: 78.7 fL — ABNORMAL LOW (ref 80.0–100.0)
Platelets: 106 10*3/uL — ABNORMAL LOW (ref 150–400)
RBC: 3.76 MIL/uL — ABNORMAL LOW (ref 4.22–5.81)
RDW: 13.8 % (ref 11.5–15.5)
WBC: 25.3 10*3/uL — ABNORMAL HIGH (ref 4.0–10.5)
nRBC: 0.1 % (ref 0.0–0.2)

## 2020-08-22 LAB — RENAL FUNCTION PANEL
Albumin: 1.5 g/dL — ABNORMAL LOW (ref 3.5–5.0)
Albumin: 1.4 g/dL — ABNORMAL LOW (ref 3.5–5.0)
Anion gap: 19 — ABNORMAL HIGH (ref 5–15)
Anion gap: 19 — ABNORMAL HIGH (ref 5–15)
BUN: 144 mg/dL — ABNORMAL HIGH (ref 8–23)
BUN: 153 mg/dL — ABNORMAL HIGH (ref 8–23)
CO2: 22 mmol/L (ref 22–32)
CO2: 19 mmol/L — ABNORMAL LOW (ref 22–32)
Calcium: 7 mg/dL — ABNORMAL LOW (ref 8.9–10.3)
Calcium: 7.1 mg/dL — ABNORMAL LOW (ref 8.9–10.3)
Chloride: 95 mmol/L — ABNORMAL LOW (ref 98–111)
Chloride: 94 mmol/L — ABNORMAL LOW (ref 98–111)
Creatinine, Ser: 5.57 mg/dL — ABNORMAL HIGH (ref 0.61–1.24)
Creatinine, Ser: 5.91 mg/dL — ABNORMAL HIGH (ref 0.61–1.24)
GFR, Estimated: 10 mL/min — ABNORMAL LOW (ref >60)
GFR, Estimated: 10 mL/min — ABNORMAL LOW (ref >60)
Glucose, Bld: 112 mg/dL — ABNORMAL HIGH (ref 70–99)
Glucose, Bld: 303 mg/dL — ABNORMAL HIGH (ref 70–99)
Phosphorus: 8.3 mg/dL — ABNORMAL HIGH (ref 2.5–4.6)
Phosphorus: 9.8 mg/dL — ABNORMAL HIGH (ref 2.5–4.6)
Potassium: 6.1 mmol/L — ABNORMAL HIGH (ref 3.5–5.1)
Potassium: 6.5 mmol/L (ref 3.5–5.1)
Sodium: 136 mmol/L (ref 135–145)
Sodium: 132 mmol/L — ABNORMAL LOW (ref 135–145)

## 2020-08-22 LAB — HEPATIC FUNCTION PANEL
ALT: 166 U/L — ABNORMAL HIGH (ref 0–44)
AST: 158 U/L — ABNORMAL HIGH (ref 15–41)
Albumin: 1.5 g/dL — ABNORMAL LOW (ref 3.5–5.0)
Alkaline Phosphatase: 316 U/L — ABNORMAL HIGH (ref 38–126)
Bilirubin, Direct: 0.6 mg/dL — ABNORMAL HIGH (ref 0.0–0.2)
Indirect Bilirubin: 0.5 mg/dL (ref 0.3–0.9)
Total Bilirubin: 1.1 mg/dL (ref 0.3–1.2)
Total Protein: 5.3 g/dL — ABNORMAL LOW (ref 6.5–8.1)

## 2020-08-22 LAB — PHOSPHORUS: Phosphorus: 9.6 mg/dL — ABNORMAL HIGH (ref 2.5–4.6)

## 2020-08-22 LAB — MAGNESIUM: Magnesium: 2 mg/dL (ref 1.7–2.4)

## 2020-08-22 LAB — BASIC METABOLIC PANEL
Anion gap: 20 — ABNORMAL HIGH (ref 5–15)
BUN: 167 mg/dL — ABNORMAL HIGH (ref 8–23)
CO2: 21 mmol/L — ABNORMAL LOW (ref 22–32)
Calcium: 7.1 mg/dL — ABNORMAL LOW (ref 8.9–10.3)
Chloride: 93 mmol/L — ABNORMAL LOW (ref 98–111)
Creatinine, Ser: 6.32 mg/dL — ABNORMAL HIGH (ref 0.61–1.24)
GFR, Estimated: 9 mL/min — ABNORMAL LOW (ref >60)
Glucose, Bld: 266 mg/dL — ABNORMAL HIGH (ref 70–99)
Potassium: 6.3 mmol/L (ref 3.5–5.1)
Sodium: 134 mmol/L — ABNORMAL LOW (ref 135–145)

## 2020-08-22 LAB — HEPARIN LEVEL (UNFRACTIONATED)
Heparin Unfractionated: 0.34 IU/mL (ref 0.30–0.70)
Heparin Unfractionated: 0.38 IU/mL (ref 0.30–0.70)

## 2020-08-22 LAB — FIBRIN DERIVATIVES D-DIMER (ARMC ONLY): Fibrin derivatives D-dimer (ARMC): 4913.43 ng/mL (FEU) — ABNORMAL HIGH (ref 0.00–499.00)

## 2020-08-22 LAB — C-REACTIVE PROTEIN: CRP: 25.4 mg/dL — ABNORMAL HIGH (ref <1.0)

## 2020-08-22 LAB — FERRITIN: Ferritin: 1758 ng/mL — ABNORMAL HIGH (ref 24–336)

## 2020-08-22 IMAGING — DX DG CHEST 1V PORT
1 series · 1 of 1 positions shown · non-contrast
Comparison: [DATE]

CLINICAL DATA: Respiratory failure.

EXAM:
PORTABLE CHEST 1 VIEW

[chest ap]
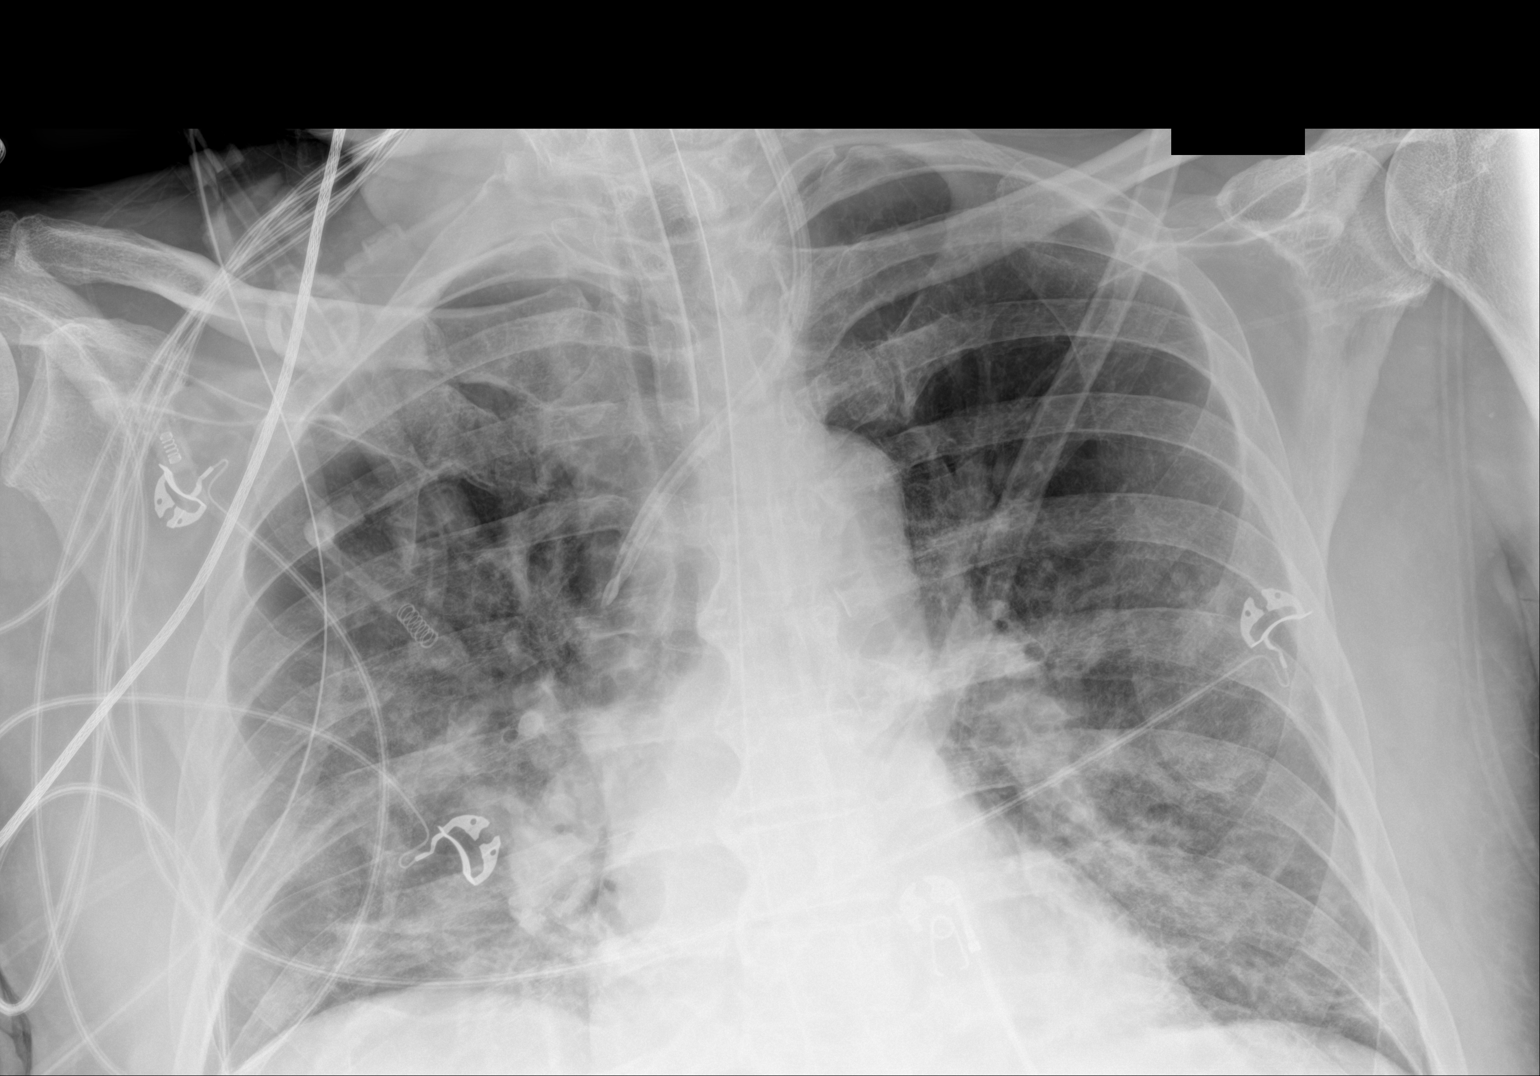

[1 of 1 positions shown; findings below may reference images not displayed]

FINDINGS: Hemidiaphragms not completely imaged. Endotracheal tube, NG tube,
left IJ line stable position. Heart size normal. Diffuse bilateral
pulmonary infiltrates again noted. No significant interim change. No
prominent pleural effusion. No pneumothorax.
IMPRESSION: 1. Diffuse bilateral pulmonary infiltrates again noted. No
significant interim change.
2. Lines and tubes in stable position.

## 2020-08-22 MED ORDER — INSULIN ASPART 100 UNIT/ML IV SOLN
10.0000 [IU] | Freq: Once | INTRAVENOUS | Status: AC
  Administered 2020-08-22: 10 [IU] via INTRAVENOUS
  Filled 2020-08-22: qty 0.1

## 2020-08-22 MED ORDER — CALCIUM GLUCONATE-NACL 1-0.675 GM/50ML-% IV SOLN
1.0000 g | Freq: Once | INTRAVENOUS | Status: AC
  Administered 2020-08-22: 1000 mg via INTRAVENOUS
  Filled 2020-08-22: qty 50

## 2020-08-22 MED ORDER — DEXTROSE 50 % IV SOLN
12.5000 g | Freq: Once | INTRAVENOUS | Status: AC
  Administered 2020-08-22: 12.5 g via INTRAVENOUS
  Filled 2020-08-22: qty 50

## 2020-08-22 MED ORDER — FUROSEMIDE 10 MG/ML IJ SOLN
60.0000 mg | Freq: Once | INTRAMUSCULAR | Status: AC
  Administered 2020-08-22: 60 mg via INTRAVENOUS
  Filled 2020-08-22: qty 6

## 2020-08-22 MED ORDER — INSULIN ASPART 100 UNIT/ML ~~LOC~~ SOLN
10.0000 [IU] | Freq: Once | SUBCUTANEOUS | Status: AC
  Administered 2020-08-22: 10 [IU] via INTRAVENOUS
  Filled 2020-08-22: qty 0.1
  Filled 2020-08-22: qty 1

## 2020-08-22 MED ORDER — INSULIN DETEMIR 100 UNIT/ML ~~LOC~~ SOLN
20.0000 [IU] | Freq: Two times a day (BID) | SUBCUTANEOUS | Status: DC
  Administered 2020-08-22 – 2020-08-30 (×15): 20 [IU] via SUBCUTANEOUS
  Filled 2020-08-22 (×18): qty 0.2

## 2020-08-22 MED ORDER — CALCIUM GLUCONATE-NACL 2-0.675 GM/100ML-% IV SOLN
2.0000 g | Freq: Once | INTRAVENOUS | Status: AC
  Administered 2020-08-22: 2000 mg via INTRAVENOUS
  Filled 2020-08-22: qty 100

## 2020-08-22 MED ORDER — INSULIN DETEMIR 100 UNIT/ML ~~LOC~~ SOLN
15.0000 [IU] | Freq: Two times a day (BID) | SUBCUTANEOUS | Status: DC
  Administered 2020-08-22: 15 [IU] via SUBCUTANEOUS
  Filled 2020-08-22 (×3): qty 0.15

## 2020-08-22 MED ORDER — SODIUM ZIRCONIUM CYCLOSILICATE 5 G PO PACK
10.0000 g | PACK | Freq: Two times a day (BID) | ORAL | Status: DC
  Administered 2020-08-22: 10 g
  Filled 2020-08-22: qty 2

## 2020-08-22 MED ORDER — DEXTROSE 50 % IV SOLN
1.0000 | Freq: Once | INTRAVENOUS | Status: AC
  Administered 2020-08-22: 50 mL via INTRAVENOUS
  Filled 2020-08-22: qty 50

## 2020-08-22 MED ORDER — SODIUM CHLORIDE 0.9 % IV SOLN
1.0000 g | INTRAVENOUS | Status: DC
  Administered 2020-08-22 – 2020-08-26 (×5): 1 g via INTRAVENOUS
  Filled 2020-08-22 (×7): qty 1

## 2020-08-22 MED ORDER — SODIUM ZIRCONIUM CYCLOSILICATE 5 G PO PACK
10.0000 g | PACK | Freq: Three times a day (TID) | ORAL | Status: DC
  Administered 2020-08-22 – 2020-08-28 (×16): 10 g
  Filled 2020-08-22: qty 1
  Filled 2020-08-22 (×2): qty 2
  Filled 2020-08-22 (×2): qty 1
  Filled 2020-08-22 (×3): qty 2
  Filled 2020-08-22: qty 1
  Filled 2020-08-22 (×5): qty 2
  Filled 2020-08-22: qty 1
  Filled 2020-08-22 (×2): qty 2
  Filled 2020-08-22 (×3): qty 1
  Filled 2020-08-22: qty 2
  Filled 2020-08-22: qty 1

## 2020-08-22 MED ORDER — SODIUM ZIRCONIUM CYCLOSILICATE 5 G PO PACK
10.0000 g | PACK | Freq: Three times a day (TID) | ORAL | Status: DC
  Administered 2020-08-22: 10 g via ORAL
  Filled 2020-08-22: qty 2

## 2020-08-22 NOTE — Progress Notes (Signed)
NAME:  Nathaniel Thomas, MRN:  [id number], DOB:  1951-02-19, LOS: 9 ADMISSION DATE:  08/23/2020, CONSULTATION DATE:  08/07/2020 REFERRING MD:  Raliegh Ip CHIEF COMPLAINT:  Respiratory failure  Brief History:  70 y.o.malewith PMH as noted below who presents with altered mental status after he was found unresponsive this morning.   Per EMS he was initially hypoxic although this appears to have resolved. The patient himself is unable to give any history.  Patient Dx with COVID 1st week of Jan 2022 +NVD for several days ER shows severe acidosis with elevated sugars Patient with severe hypothermia and intubated for severe resp failure  Past Medical History:  He,  has a past medical history of CAD (coronary artery disease), Cataract, CHICKENPOX, HX OF (08/30/2008), Chronic kidney disease, Diabetes mellitus, Glaucoma, History of urinary calculi (08/30/2008), Hyperlipidemia, Hypertension, Renal calculus or stone, and Skin cancer of nose.   Significant Hospital Events:  1/10 admitted to ICU, severe DKA, gastroentertitis 1/11 severe resp failure 1/12 severe DKA 1/13 severe resp failure, worsening Renal failure 1/14- Reviewed care plan with cardiologist Dr Ubaldo Glassing, plan for cardiac cardoversion. Patient remains in shock with AFRVR on MV, medically optimizing for procedure.  I met with daughter York Cerise.   1/15- patient improved slightly, now with rate controlled AF off Neo. Discussed case with cardiology.  08/19/20- vitals are improved, rate controlled.  BP normalized.  Ventilator weaned to FiO2 60% remains crtically ill.    Consults:  Cardiology Nephrology  Procedures:  08/16/20 CVL 08/15/2020 CVL 08/16/2020 ETT  Significant Diagnostic Tests:  CT Head 08/26/2020 Negative motion degraded head CT.  Micro Data:  Tracheal Aspirate 08/18/20 - Serratia Marcescens   Antimicrobials:  Vancomycin 1/10 Cefepime 1/10   Interim History / Subjective:  No acute events overnight  Dialysis yesterday. Potassium  remains elevated today.   Tmax 100.9  Objective   Blood pressure (!) 127/52, pulse 87, temperature 99.86 F (37.7 C), resp. rate (!) 23, height _0  (1.93 m), weight 108.5 kg, SpO2 96 %.    Vent Mode: PRVC FiO2 (%):  [60 %] 60 % Set Rate:  [22 bmp] 22 bmp Vt Set:  [520 mL] 520 mL PEEP:  [10 cmH20] 10 cmH20 Plateau Pressure:  [24 cmH20] 24 cmH20   Intake/Output Summary (Last 24 hours) at 08/22/2020 1052 Last data filed at 08/22/2020 0600 Gross per 24 hour  Intake 1246.49 ml  Output 1150 ml  Net 96.49 ml   Filed Weights   08/20/20 0500 08/21/20 0410 08/22/20 0500  Weight: 106.2 kg 106.9 kg 108.5 kg    Examination: General: chronically ill appearing male, sedated HENT: [place]/AT, ET tube in place, moist mucous membranes Lungs: Course breath sounds. No wheezing Cardiovascular: RRR, s1s2 Abdomen: soft, non-distended, BS+ Extremities: 1+ edema Neuro: sedated, not responsive GU: foley in place  Resolved Hospital Problem list     Assessment & Plan:  Acute hypoxemic Respiratory failure due to Covid 19 Pneumonia - Continue mechanical ventilatory support - Unable to wean sedation due to agonal respirations - Serratia Marcesans pneumonia, treating with cefepime - Awaiting respiratory culture results from 1/18  Acute Kidney Injury, oliguric Hyperkalemia - Nephrology following - iHD, next session Thursday - No dialysis today, start lokelma 27m TID - give lasix 635monce  Hypoglycemia - Insulin has been on hold - Blood sugars responding to D10 drip, can hold now that blood sugars are in 200s - Check LFTs  Shock - he has been weaned off vasopressor support - Stress dose steroids to q12  hours   Encephalopathy In setting of multiorgan failure - requiring high amounts of sedation  Atrial Fibrillation - continue amiodarone PO - on metoprolol 12.18m BID - Off diltiazem gtt - heparin dripp  Type II Diabetes DKA- resolved --Blood sugars stable --Resume SSI and start  levemir 15 units BID  --Monitor closely given recent hypoglycemic events  Leukocytosis Fever - check blood cultures - will check about removing foley catheter  Best practice (evaluated daily)  Diet: TF Pain/Anxiety/Delirium protocol (if indicated): fentanyl, precedex, versed VAP protocol (if indicated): ordered DVT prophylaxis: heparin GI prophylaxis: PPI  Glucose control: SSI Mobility: bed rest Disposition: ICU Code Status: Full code.  Spoke with daughter and wife on phone 08/22/20. All questions answered. Confirmed full code status for now.   Labs   CBC: Recent Labs  Lab 08/18/20 0429 08/19/20 0302 08/20/20 0319 08/21/20 0400 08/22/20 0352  WBC 16.8* 21.2* 17.4* 25.6* 25.3*  HGB 11.6* 11.4* 11.6* 10.0* 11.1*  HCT 33.5* 32.1* 31.7* 26.9* 29.6*  MCV 86.8 84.3 80.7 79.8* 78.7*  PLT 123* 119* 100* 112* 106*    Basic Metabolic Panel: Recent Labs  Lab 08/18/20 0429 08/19/20 0302 08/19/20 0315 08/19/20 2101 08/20/20 0319 08/21/20 0400 08/21/20 1832 08/22/20 0352  NA 140  --  142 141 139 137 139 136  K 4.7  --  4.1 4.9 4.9 6.3* 5.5* 6.1*  CL 108  --  102 100 102 98 98 95*  CO2 16*  --  24 22 21* 19* 23 22  GLUCOSE 210*  --  122* 154* 116* 123* 50* 112*  BUN 118*  --  106* 142* 147* 178* 134* 144*  CREATININE 6.13*  --  5.28* 5.82* 5.93* 6.64* 5.42* 5.57*  CALCIUM 6.1*  --  6.2* 6.2* 6.4* 6.3* 6.6* 7.0*  MG 2.2 1.9  --   --  2.0 2.2  --  2.0  PHOS 6.6*  --  5.2*  --  7.5* 9.2*  --  8.3*   GFR: Estimated Creatinine Clearance: 16.7 mL/min (A) (by C-G formula based on SCr of 5.57 mg/dL (H)). Recent Labs  Lab 08/19/20 0302 08/20/20 0319 08/21/20 0400 08/22/20 0352  WBC 21.2* 17.4* 25.6* 25.3*    Liver Function Tests: Recent Labs  Lab 08/18/20 0429 08/19/20 0315 08/20/20 0319 08/21/20 0400 08/22/20 0352  ALBUMIN 1.7* 1.7* 1.6* 1.6* 1.5*   No results for input(s): LIPASE, AMYLASE in the last 168 hours. No results for input(s): AMMONIA in the last 168  hours.  ABG    Component Value Date/Time   PHART 7.39 08/19/2020 0250   PCO2ART 41 08/19/2020 0250   PO2ART 97 08/19/2020 0250   HCO3 24.8 08/19/2020 0250   ACIDBASEDEF 0.2 08/19/2020 0250   O2SAT 97.5 08/19/2020 0250     Coagulation Profile: No results for input(s): INR, PROTIME in the last 168 hours.  Cardiac Enzymes: Recent Labs  Lab 08/16/20 1339  CKMB 28.3*    HbA1C: Hemoglobin A1C  Date/Time Value Ref Range Status  08/10/2017 11:53 AM 7.5  Final  04/09/2017 08:30 AM 7.3  Final   Hgb A1c MFr Bld  Date/Time Value Ref Range Status  01/11/2015 08:33 AM 7.5 (H) 4.6 - 6.5 % Final    Comment:    Glycemic Control Guidelines for People with Diabetes:Non Diabetic:  <6%Goal of Therapy: <7%Additional Action Suggested:  >8%   07/31/2014 03:27 PM 7.2 (H) 4.6 - 6.5 % Final    Comment:    Glycemic Control Guidelines for People with Diabetes:Non Diabetic:  <  6%Goal of Therapy: <7%Additional Action Suggested:  >8%     CBG: Recent Labs  Lab 08/21/20 1940 08/21/20 2259 08/22/20 0110 08/22/20 0316 08/22/20 0758  GLUCAP 38* 61* 75 99 176*    Critical care time: 40 minutes    Freda Jackson, MD [facility] Office: [phone]   See Amion for Pager Details

## 2020-08-22 NOTE — Progress Notes (Signed)
Inpatient Diabetes Program Recommendations  AACE/ADA: New Consensus Statement on Inpatient Glycemic Control (2015)  Target Ranges:  Prepandial:   less than 140 mg/dL      Peak postprandial:   less than 180 mg/dL (1-2 hours)      Critically ill patients:  140 - 180 mg/dL   Lab Results  Component Value Date   GLUCAP 176 (H) 08/22/2020   HGBA1C 7.5 08/10/2017    Review of Glycemic Control Results for Nathaniel Thomas, Nathaniel Thomas (MRN [id number]) as of 08/22/2020 11:38  Ref. Range 08/21/2020 19:40 08/21/2020 22:59 08/22/2020 01:10 08/22/2020 03:16 08/22/2020 07:58  Glucose-Capillary Latest Ref Range: 70 - 99 mg/dL 38 (LL) 61 (L) 75 99 176 (H)   Diabetes history:DM2 Outpatient Diabetes medications:Levemir 25 units BID, Amaryl 4 mg daily, Jardiance 25 mg daily, Metformin 1000 mg BID Current orders for Inpatient glycemic control: Novolog 0-20units Q4H Solucortef 50 mg Q12H,  Nepro @ 50 ml/hr D10@ 20 ml/hr   Inpatient Diabetes Program Recommendations:  Noted hypoglycemia yesterday in the 30's ml/dL, assuming partially related to HD and renal status.   When glucose trends begin to exceed inpatient goals of 180 mg/dL, Consider adding back Levemir 12 units BID and reducing correction to Novolog 0-9 units Q4H.   Thanks, Bronson Curb, MSN, RNC-OB Diabetes Coordinator [phone] (8a-5p)

## 2020-08-22 NOTE — Progress Notes (Signed)
New sacral pad + xeroform placed. Pt remains on fent + versed + heparin gtt's. Pt hypoglycemic @ outset of shift. D10 gtt started @ 20 ml/hr and then increased to 40 ml/hr. Q2 BG checks completed. K low this AM (See MAR for treatment). Pt in no acute distress @ this time. Will continue to monitor.

## 2020-08-22 NOTE — Progress Notes (Signed)
1420: Critical K: 6.5. relayed to Dr. Erin Fulling. Next due Lokelma at 1600. No new orders at this time. MD also notified of persistent elevated BS 303.   Will continue to monitor closely.

## 2020-08-22 NOTE — Progress Notes (Signed)
[address] [place], [place] [address] Phone [phone]. Fax [phone]  Date: 08/22/2020                  Patient Name:  Nathaniel Thomas  MRN: [id number]  DOB: February 22, 1951  Age / Sex: 70 y.o., male         PCP: Dion Body, MD                  Presenting Illness: Patient is a 70 y.o. male  admitted to [facility] on 08/28/2020 for evaluation of Metabolic acidosis [K82.0] Encounter for central line placement [Z45.2] Acute renal failure (ARF) ([place]) [N17.9] Acute respiratory failure with hypoxia ([place]) [J96.01] Sepsis, due to unspecified organism, unspecified whether acute organ dysfunction present ([place]) [A41.9] COVID-19 [U07.1]   Patient presented to the ER via EMS from home for altered mental status.  Recently treated for COVID.  Per triage notes, patient was prescribed antibiotics for UTI but is unable to take due to lethargy.  Upon arrival patient was responsive to painful stimuli. Also reported to have nausea vomiting and diarrhea for several days prior to admission.  Initial evaluation showed severe acidosis with elevated blood sugars.  Also noted to have hypothermia.  He was intubated for severe acute respiratory failure.  Initially on presentation, pH of 6.99. Patient was treated with ivermectin as outpatient for COVID.  He is unvaccinated.  Hospital course:  1/14- now on amiodarone, vasopressin. Ketamine, fentanyl, phenylephrine. UOP remains poor.  1/15-  Remains critically ill.  Still on the vent. Due for another dialysis session today. Poor urine output.  1/16-patient underwent hemodialysis yesterday.  Remains critically ill.  Still on the ventilator.  Remains oliguric.  1/17- patient continues to have significant renal dysfunction.  Urine output only 225 cc over the preceding 24 hours.  Still on the ventilator. 1/18- critical illness persist.  Patient still requiring vent support.  Patient due for dialysis treatment today.  Urine output remains diminished at 175  cc over the preceding 24 hours. 1/19- patient remains critically ill.  Still requiring significant vent support with FiO2 of 60%.  Completed dialysis yesterday.  Remains oliguric.  01/18 0701 - 01/19 0700 In: 1246.5 [I.V.:810.9; NG/GT:435.6] Out: 1150 [Urine:150]    Vital Signs: Blood pressure (!) 137/57, pulse 87, temperature 100.04 F (37.8 C), resp. rate (!) 23, height 6' 4"  (1.93 m), weight 108.5 kg, SpO2 98 %.   Intake/Output Summary (Last 24 hours) at 08/22/2020 1207 Last data filed at 08/22/2020 0600 Gross per 24 hour  Intake 1246.49 ml  Output 1150 ml  Net 96.49 ml    Weight trends: Filed Weights   08/20/20 0500 08/21/20 0410 08/22/20 0500  Weight: 106.2 kg 106.9 kg 108.5 kg    Physical Exam: General:  Critically ill-appearing  HEENT  ET tube in place  Lungs:  Ventilator assisted, FiO2 60%  Heart::  Regular  Abdomen:  Nondistended  Extremities:  1+ edema  Neurologic:  Sedated  Skin:  Warm, dry  Access:  Left IJ temp Cath 1/13- ICU team  Foley:  In place       Lab results: Basic Metabolic Panel: Recent Labs  Lab 08/20/20 0319 08/21/20 0400 08/21/20 1832 08/22/20 0352  NA 139 137 139 136  K 4.9 6.3* 5.5* 6.1*  CL 102 98 98 95*  CO2 21* 19* 23 22  GLUCOSE 116* 123* 50* 112*  BUN 147* 178* 134* 144*  CREATININE 5.93* 6.64* 5.42* 5.57*  CALCIUM 6.4* 6.3* 6.6* 7.0*  MG 2.0 2.2  --  2.0  PHOS 7.5* 9.2*  --  8.3*    Liver Function Tests: Recent Labs  Lab 08/22/20 0352  ALBUMIN 1.5*   No results for input(s): LIPASE, AMYLASE in the last 168 hours. No results for input(s): AMMONIA in the last 168 hours.  CBC: Recent Labs  Lab 08/21/20 0400 08/22/20 0352  WBC 25.6* 25.3*  HGB 10.0* 11.1*  HCT 26.9* 29.6*  MCV 79.8* 78.7*  PLT 112* 106*    Cardiac Enzymes: No results for input(s): CKTOTAL, TROPONINI in the last 168 hours.  BNP: Invalid input(s): POCBNP  CBG: Recent Labs  Lab 08/21/20 2259 08/22/20 0110 08/22/20 0316 08/22/20 0758  08/22/20 1151  GLUCAP 61* 75 99 176* 245*    Microbiology: Recent Results (from the past 720 hour(s))  Blood Culture (routine x 2)     Status: None   Collection Time: 08/06/2020  9:50 AM   Specimen: BLOOD  Result Value Ref Range Status   Specimen Description BLOOD LEFT AC  Final   Special Requests   Final    BOTTLES DRAWN AEROBIC AND ANAEROBIC Blood Culture adequate volume   Culture   Final    NO GROWTH 5 DAYS Performed at [facility], [address] [place], [place] [address]    Report Status 08/18/2020 FINAL  Final  Blood Culture (routine x 2)     Status: None   Collection Time: 08/31/2020  9:50 AM   Specimen: BLOOD  Result Value Ref Range Status   Specimen Description BLOOD RIGHT AC  Final   Special Requests   Final    BOTTLES DRAWN AEROBIC AND ANAEROBIC Blood Culture results may not be optimal due to an excessive volume of blood received in culture bottles   Culture   Final    NO GROWTH 5 DAYS Performed at [facility], [address] [place], [place] [address]    Report Status 08/18/2020 FINAL  Final  Urine culture     Status: None   Collection Time: 08/18/2020 11:36 AM   Specimen: Urine, Random  Result Value Ref Range Status   Specimen Description   Final    URINE, RANDOM Performed at [facility], [address] [place], [place] [address]    Special Requests   Final    NONE Performed at [facility], [address] [place], [place] [address]    Culture   Final    NO GROWTH Performed at [facility], [place] [address], [place], [place] [address]    Report Status 08/14/2020 FINAL  Final  Culture, respiratory     Status: None   Collection Time: 08/17/20  3:15 PM   Specimen: Tracheal Aspirate; Respiratory  Result Value Ref Range Status   Specimen Description   Final    TRACHEAL ASPIRATE Performed at [facility], [address], [place], [place] [address]    Special Requests   Final    NONE Performed at  [facility], [place], [place] [address]    Gram Stain NO WBC SEEN MODERATE GRAM VARIABLE ROD   Final   Culture   Final    ABUNDANT SERRATIA MARCESCENS SUSCEPTIBILITIES PERFORMED ON PREVIOUS CULTURE WITHIN THE LAST 5 DAYS. Performed at [facility], [place] [address], [place], [place] [address]    Report Status 08/20/2020 FINAL  Final  Culture, respiratory     Status: None   Collection Time: 08/18/20 11:09 AM  Specimen: Tracheal Aspirate; Respiratory  Result Value Ref Range Status   Specimen Description   Final    TRACHEAL ASPIRATE Performed at [facility], [address] [place], [place] [address]    Special Requests   Final    NONE Performed at [facility], [place], [place] [address]    Gram Stain   Final    MODERATE WBC PRESENT, PREDOMINANTLY PMN MODERATE GRAM NEGATIVE RODS FEW GRAM VARIABLE ROD RARE GRAM POSITIVE COCCI Performed at [facility], [place] [address], [place], [place] [address]    Culture ABUNDANT SERRATIA MARCESCENS  Final   Report Status 08/20/2020 FINAL  Final   Organism ID, Bacteria SERRATIA MARCESCENS  Final      Susceptibility   Serratia marcescens - MIC*    CEFAZOLIN >=64 RESISTANT Resistant     CEFEPIME <=0.12 SENSITIVE Sensitive     CEFTAZIDIME <=1 SENSITIVE Sensitive     CEFTRIAXONE <=0.25 SENSITIVE Sensitive     CIPROFLOXACIN <=0.25 SENSITIVE Sensitive     GENTAMICIN <=1 SENSITIVE Sensitive     TRIMETH/SULFA <=20 SENSITIVE Sensitive     * ABUNDANT SERRATIA MARCESCENS  MRSA PCR Screening     Status: None   Collection Time: 08/18/20 12:52 PM   Specimen: Nasopharyngeal  Result Value Ref Range Status   MRSA by PCR NEGATIVE NEGATIVE Final    Comment:        The GeneXpert MRSA Assay (FDA approved for NASAL specimens only), is one component of a comprehensive MRSA colonization surveillance program. It is not intended to diagnose MRSA infection nor to guide or monitor  treatment for MRSA infections. Performed at [facility], [address] [name], [place] [address]   Culture, respiratory (non-expectorated)     Status: None (Preliminary result)   Collection Time: 08/21/20  4:52 PM   Specimen: Tracheal Aspirate; Respiratory  Result Value Ref Range Status   Specimen Description   Final    TRACHEAL ASPIRATE Performed at [facility], [address] [place], [place] [address]    Special Requests   Final    NONE Performed at [facility], [address] [place], [place] [address]    Gram Stain   Final    FEW WBC PRESENT, PREDOMINANTLY PMN ABUNDANT GRAM VARIABLE ROD MODERATE GRAM POSITIVE COCCI    Culture   Final    TOO YOUNG TO READ Performed at [facility], [place] [address] [place], [place] [address]    Report Status PENDING  Incomplete     Coagulation Studies: No results for input(s): LABPROT, INR in the last 72 hours.  Urinalysis: No results for input(s): COLORURINE, LABSPEC, PHURINE, GLUCOSEU, HGBUR, BILIRUBINUR, KETONESUR, PROTEINUR, UROBILINOGEN, NITRITE, LEUKOCYTESUR in the last 72 hours.  Invalid input(s): APPERANCEUR      Imaging: DG Abd 1 View  Result Date: 08/21/2020 CLINICAL DATA:  Orogastric tube placement EXAM: ABDOMEN - 1 VIEW COMPARISON:  Portable exam 1437 hours compared to 08/18/2020 FINDINGS: Tip of orogastric tube projects over stomach. RIGHT basilar opacity question pneumonia versus atelectasis. Nonobstructive bowel gas pattern. Osseous structures unremarkable. IMPRESSION: Orogastric tube projects over proximal stomach RIGHT basilar opacity question atelectasis versus pneumonia. Electronically Signed   By: Lavonia Dana M.D.   On: 08/21/2020 14:46   DG Chest Port 1 View  Result Date: 08/22/2020 CLINICAL DATA:  Respiratory failure. EXAM: PORTABLE CHEST 1 VIEW COMPARISON:  08/20/2020 FINDINGS: Hemidiaphragms not completely imaged. Endotracheal tube, NG tube, left IJ line stable  position. Heart size normal. Diffuse bilateral pulmonary infiltrates again noted. No significant interim change. No prominent pleural effusion. No pneumothorax. IMPRESSION: 1. Diffuse bilateral pulmonary infiltrates again noted. No significant interim change. 2. Lines and tubes in stable position. Electronically Signed   By: Marcello Moores  Register   On: 08/22/2020 06:18   Scheduled Meds: . amiodarone  200 mg Per Tube BID  . chlorhexidine gluconate (MEDLINE KIT)  15 mL Mouth Rinse BID  . Chlorhexidine Gluconate Cloth  6 each Topical Daily  . docusate  100 mg Per Tube BID  . feeding supplement (PROSource TF)  90 mL Per Tube BID  . hydrocortisone sod succinate (SOLU-CORTEF) inj  50 mg Intravenous Q12H  . insulin aspart  0-20 Units Subcutaneous Q4H  . mouth rinse  15 mL Mouth Rinse 10 times per day  . metoprolol tartrate  12.5 mg Per NG tube BID  . multivitamin  1 tablet Per Tube QHS  . sodium chloride flush  3 mL Intravenous Q12H  . sodium zirconium cyclosilicate  10 g Per Tube TID   Continuous Infusions: . sodium chloride    . dexmedetomidine (PRECEDEX) IV infusion Stopped (08/20/20 2225)  . dextrose 20 mL/hr at 08/22/20 0922  . famotidine (PEPCID) IV 20 mg (08/22/20 0927)  . feeding supplement (NEPRO CARB STEADY) 1,000 mL (08/21/20 1659)  . fentaNYL infusion INTRAVENOUS 200 mcg/hr (08/22/20 1059)  . heparin 1,400 Units/hr (08/21/20 2000)  . midazolam 5 mg/hr (08/22/20 1059)   PRN Meds:.sodium chloride, acetaminophen (TYLENOL) oral liquid 160 mg/5 mL, docusate, fentaNYL, heparin, metoprolol tartrate, midazolam, polyethylene glycol, sodium chloride flush   Assessment & Plan: Pt is a 70 y.o.   male with coronary artery disease, diabetes, glaucoma, history of kidney stones, hyperlipidemia, hypertension, left knee replacement, lithotripsy, basal cell carcinoma, was admitted on 1/32/4401 with Metabolic acidosis [U27.2] Encounter for central line placement [Z45.2] Acute renal failure (ARF) ([place])  [N17.9] Acute respiratory failure with hypoxia ([place]) [J96.01] Sepsis, due to unspecified organism, unspecified whether acute organ dysfunction present ([place]) [A41.9] COVID-19 [U07.1]   #Acute kidney injury Baseline creatinine 1.0 from August 02, 2020 Presenting creatinine of 2.15 Lab Results  Component Value Date   CREATININE 5.57 (H) 08/22/2020   CREATININE 5.42 (H) 08/21/2020   CREATININE 6.64 (H) 08/21/2020     #Acute respiratory failure Requiring ventilator support.  Intubated 08/14/2020  #COVID-19 pneumonia Treated with ivermectin as outpatient  #Diabetes type 2, insulin dependent Treated with metformin, Jardiance (empagliflozin), Amaryl as outpatient Hemoglobin A1c 7.9% from June 26, 2020 Lab Results  Component Value Date   HGBA1C 7.5 08/10/2017    #Severe acidosis Treated with dialysis    Plan: Patient continues to have oliguric range urine output.  Overall remains critically ill.  Patient underwent dialysis yesterday.  Potassium noted to be high today at 6.1 despite dialysis.  Renal insufficiency could be playing some role.  We will go ahead and initiate Lokelma 10 g 3 times daily.  In addition we are planning for additional dialysis treatment tomorrow.  Otherwise weaning from the ventilator as per pulmonary/critical care.   LOS: 9 Ezell Poke 1/19/202212:07 PM    Note: This note was prepared with Dragon dictation. Any transcription errors are unintentional

## 2020-08-22 NOTE — Progress Notes (Signed)
[place] for heparin Indication: atrial fibrillation  Patient Measurements: Heparin Dosing Weight: 90 kg  Labs: Recent Labs    08/20/20 0319 08/21/20 0400 08/21/20 1510 08/21/20 1832 08/22/20 0032 08/22/20 0352  HGB 11.6* 10.0*  --   --   --  11.1*  HCT 31.7* 26.9*  --   --   --  29.6*  PLT 100* 112*  --   --   --  106*  HEPARINUNFRC 0.31 0.27* 0.44  --  0.38 0.34  CREATININE 5.93* 6.64*  --  5.42*  --  5.57*   Estimated Creatinine Clearance: 16.7 mL/min (A) (by C-G formula based on SCr of 5.57 mg/dL (H)).  Medical History: Past Medical History:  Diagnosis Date  . CAD (coronary artery disease)   . Cataract   . CHICKENPOX, HX OF 08/30/2008   Qualifier: Diagnosis of  By: [name], [name]    . Chronic kidney disease   . Diabetes mellitus   . Glaucoma   . History of urinary calculi 08/30/2008   Qualifier: Diagnosis of  By: [name], Jacelyn Pi   . Hyperlipidemia   . Hypertension   . Renal calculus or stone   . Skin cancer of nose    ears and hands   Assessment: 70 year old male admitted with severe DKA, which has since resolved and patient now on SQ insulin regimen. Patient with worsening renal function, plan to start HD 1/13. Patient with new afib 1/13, started on amiodarone and heparin.   Date Time HL Rate/comment 1/15 0429 0.42 1200 units/hr - plts trending down 1/16 0302 0.28 1200 units/hr 1/16 1400 0.36 1300 units/hr 1/16 2226 0.33 1300 units/hr 1/17 0319 0.31 1300 units/hr - plts trending down 1/18 0400 0.27 1300 units/hr - plts improved, H/H slightly worse 1/18 1510 0.44 1400 units/hr - H/H trending down, plts stable 1/19 0032 0.38 1400 units/hr - therapeutic x 2 1/19 0352 0.34 1400 units/hr - therapeutic  Goal of Therapy:  Heparin level 0.3-0.7 units/ml Monitor platelets by anticoagulation protocol: Yes   Plan:  Continue heparin infusion at 1400 units/hr Check HL and CBC daily per protocol   Tawnya Crook,  PharmD 08/22/2020,8:41 AM

## 2020-08-22 NOTE — Progress Notes (Signed)
ANTICOAGULATION CONSULT NOTE  Pharmacy Consult for heparin Indication: atrial fibrillation  Patient Measurements: Heparin Dosing Weight: 90 kg  Labs: Recent Labs    08/19/20 0302 08/19/20 0315 08/20/20 0319 08/21/20 0400 08/21/20 1510 08/21/20 1832 08/22/20 0032  HGB 11.4*  --  11.6* 10.0*  --   --   --   HCT 32.1*  --  31.7* 26.9*  --   --   --   PLT 119*  --  100* 112*  --   --   --   HEPARINUNFRC 0.28*   < > 0.31 0.27* 0.44  --  0.38  CREATININE  --    < > 5.93* 6.64*  --  5.42*  --    < > = values in this interval not displayed.   Estimated Creatinine Clearance: 17 mL/min (A) (by C-G formula based on SCr of 5.42 mg/dL (H)).  Medical History: Past Medical History:  Diagnosis Date  . CAD (coronary artery disease)   . Cataract   . CHICKENPOX, HX OF 08/30/2008   Qualifier: Diagnosis of  By: [name], [name]    . Chronic kidney disease   . Diabetes mellitus   . Glaucoma   . History of urinary calculi 08/30/2008   Qualifier: Diagnosis of  By: [name], Jacelyn Pi   . Hyperlipidemia   . Hypertension   . Renal calculus or stone   . Skin cancer of nose    ears and hands   Assessment: 70 year old male admitted with severe DKA, which has since resolved and patient now on SQ insulin regimen. Patient with worsening renal function, plan to start HD 1/13. Patient with new afib 1/13, started on amiodarone and heparin.   Date Time HL Rate/comment 1/15 0429 0.42 @1200  units/hr - plts trending down 1/16 0302 0.28 @1200  units/hr 1/16 1400 0.36 @1300  units/hr 1/16 2226 0.33 @1300  units/hr 1/17 0319 0.31 @1300  units/hr - plts trending down 1/18 0400 0.27 @1300  units/hr - plts improved, H/H slightly worse 1/18 1510 0.44 @1400  units/hr - H/H trending down, plts stable 1/19 0032 0.38 @4000  units/hr - therapeutic x 2  Goal of Therapy:  Heparin level 0.3-0.7 units/ml Monitor platelets by anticoagulation protocol: Yes   Plan:  Continue heparin infusion at 1400 units/hr Check HL and  CBC daily per protocol   Ena Dawley, PharmD 08/22/2020,1:02 AM

## 2020-08-22 NOTE — Progress Notes (Signed)
Pharmacy Antibiotic Note  Nathaniel Thomas is a 70 y.o. male admitted on 09/11/2020 with sepsis in setting of COVID-19 infection. There is concern for superimposed bacterial infection. Patient further with severe DKA. Patient was intubated 1/10 and remains sedated on mechanical ventilation. He is hypotensive requiring blood pressure support with Levophed. He is requiring dialysis. Patient previously treated with ~ 7 days cefepime. Respiratory culture with serratia. Consult to restart antibiotics.  Plan: Cefepime 1 g IV q24h in the afternoons to be given after dialysis on HD days   Height: 6\' 4"  (193 cm) Weight: 108.5 kg (239 lb 3.2 oz) IBW/kg (Calculated) : 86.8  Temp (24hrs), Avg:100.4 F (38 C), Min:99.5 F (37.5 C), Max:101.3 F (38.5 C)  Recent Labs  Lab 08/18/20 0429 08/19/20 0302 08/19/20 0315 08/19/20 2101 08/20/20 0319 08/21/20 0400 08/21/20 1832 08/22/20 0352  WBC 16.8* 21.2*  --   --  17.4* 25.6*  --  25.3*  CREATININE 6.13*  --    < > 5.82* 5.93* 6.64* 5.42* 5.57*   < > = values in this interval not displayed.    Estimated Creatinine Clearance: 16.7 mL/min (A) (by C-G formula based on SCr of 5.57 mg/dL (H)).    Allergies  Allergen Reactions  . Demerol Other (See Comments)    Blood pressure dropped completely out  . Meperidine Other (See Comments)    Drops blood pressure out  Blood pressure dropped completely out  . Meperidine Hcl Other (See Comments)    Drops blood pressure out  . Nitrospan [Nitroglycerin] Other (See Comments)    Blood pressure dropped out  . Sulfa Antibiotics Other (See Comments)    Urinary burning; blisters   . Sulfasalazine Other (See Comments)    Urinary burning; blisters     Antimicrobials this admission: Vancomycin 1/10 x 1  Cefepime 1/10 >> 1/17 Cefepime 1/19 >>   Microbiology results: 1/10 BCx: NGTD 1/10 UCx: no growth  1/14, 1/15 Trach aspirate: serratia 1/18 Trach aspirate: pending   Thank you for allowing pharmacy to  be a part of this patient's care.  Tawnya Crook, PharmD 08/22/2020 1:14 PM

## 2020-08-23 ENCOUNTER — Inpatient Hospital Stay: Payer: HMO

## 2020-08-23 ENCOUNTER — Inpatient Hospital Stay: Payer: Self-pay

## 2020-08-23 DIAGNOSIS — E872 Acidosis, unspecified: Secondary | ICD-10-CM

## 2020-08-23 DIAGNOSIS — J9601 Acute respiratory failure with hypoxia: Secondary | ICD-10-CM | POA: Diagnosis not present

## 2020-08-23 DIAGNOSIS — Z978 Presence of other specified devices: Secondary | ICD-10-CM

## 2020-08-23 DIAGNOSIS — N179 Acute kidney failure, unspecified: Secondary | ICD-10-CM | POA: Diagnosis not present

## 2020-08-23 DIAGNOSIS — U071 COVID-19: Secondary | ICD-10-CM | POA: Diagnosis not present

## 2020-08-23 DIAGNOSIS — A419 Sepsis, unspecified organism: Secondary | ICD-10-CM | POA: Diagnosis not present

## 2020-08-23 DIAGNOSIS — Z7189 Other specified counseling: Secondary | ICD-10-CM

## 2020-08-23 DIAGNOSIS — Z515 Encounter for palliative care: Secondary | ICD-10-CM

## 2020-08-23 LAB — RENAL FUNCTION PANEL
Albumin: 1.4 g/dL — ABNORMAL LOW (ref 3.5–5.0)
Anion gap: 19 — ABNORMAL HIGH (ref 5–15)
BUN: 183 mg/dL — ABNORMAL HIGH (ref 8–23)
CO2: 20 mmol/L — ABNORMAL LOW (ref 22–32)
Calcium: 7.5 mg/dL — ABNORMAL LOW (ref 8.9–10.3)
Chloride: 94 mmol/L — ABNORMAL LOW (ref 98–111)
Creatinine, Ser: 6.33 mg/dL — ABNORMAL HIGH (ref 0.61–1.24)
GFR, Estimated: 9 mL/min — ABNORMAL LOW (ref >60)
Glucose, Bld: 229 mg/dL — ABNORMAL HIGH (ref 70–99)
Phosphorus: 10.1 mg/dL — ABNORMAL HIGH (ref 2.5–4.6)
Potassium: 6.2 mmol/L — ABNORMAL HIGH (ref 3.5–5.1)
Sodium: 133 mmol/L — ABNORMAL LOW (ref 135–145)

## 2020-08-23 LAB — CBC
Hemoglobin: 9.2 g/dL — ABNORMAL LOW (ref 13.0–17.0)
Platelets: 144 10*3/uL — ABNORMAL LOW (ref 150–400)
WBC: 25.3 10*3/uL — ABNORMAL HIGH (ref 4.0–10.5)
nRBC: 0.2 % (ref 0.0–0.2)

## 2020-08-23 LAB — BLOOD GAS, ARTERIAL
Acid-base deficit: 4.9 mmol/L — ABNORMAL HIGH (ref 0.0–2.0)
Bicarbonate: 19.4 mmol/L — ABNORMAL LOW (ref 20.0–28.0)
FIO2: 0.4
MECHVT: 520 mL
O2 Saturation: 96.4 %
PEEP: 5 cmH2O
Patient temperature: 37
RATE: 22 resp/min
pCO2 arterial: 32 mmHg (ref 32.0–48.0)
pH, Arterial: 7.39 (ref 7.350–7.450)
pO2, Arterial: 86 mmHg (ref 83.0–108.0)

## 2020-08-23 LAB — HEPATITIS B SURFACE ANTIBODY,QUALITATIVE: Hep B S Ab: NONREACTIVE

## 2020-08-23 LAB — BASIC METABOLIC PANEL
Anion gap: 19 — ABNORMAL HIGH (ref 5–15)
BUN: 168 mg/dL — ABNORMAL HIGH (ref 8–23)
CO2: 19 mmol/L — ABNORMAL LOW (ref 22–32)
Calcium: 7.3 mg/dL — ABNORMAL LOW (ref 8.9–10.3)
Chloride: 94 mmol/L — ABNORMAL LOW (ref 98–111)
Creatinine, Ser: 6.15 mg/dL — ABNORMAL HIGH (ref 0.61–1.24)
GFR, Estimated: 9 mL/min — ABNORMAL LOW (ref >60)
Glucose, Bld: 252 mg/dL — ABNORMAL HIGH (ref 70–99)
Potassium: 6.1 mmol/L — ABNORMAL HIGH (ref 3.5–5.1)
Sodium: 132 mmol/L — ABNORMAL LOW (ref 135–145)

## 2020-08-23 LAB — GLUCOSE, CAPILLARY
Glucose-Capillary: 149 mg/dL — ABNORMAL HIGH (ref 70–99)
Glucose-Capillary: 150 mg/dL — ABNORMAL HIGH (ref 70–99)
Glucose-Capillary: 154 mg/dL — ABNORMAL HIGH (ref 70–99)
Glucose-Capillary: 207 mg/dL — ABNORMAL HIGH (ref 70–99)
Glucose-Capillary: 218 mg/dL — ABNORMAL HIGH (ref 70–99)
Glucose-Capillary: 229 mg/dL — ABNORMAL HIGH (ref 70–99)
Glucose-Capillary: 238 mg/dL — ABNORMAL HIGH (ref 70–99)

## 2020-08-23 LAB — MAGNESIUM: Magnesium: 2.4 mg/dL (ref 1.7–2.4)

## 2020-08-23 LAB — HEPATITIS C ANTIBODY: HCV Ab: NONREACTIVE

## 2020-08-23 LAB — HEPATITIS B CORE ANTIBODY, TOTAL: Hep B Core Total Ab: NONREACTIVE

## 2020-08-23 LAB — TRIGLYCERIDES: Triglycerides: 636 mg/dL — ABNORMAL HIGH (ref <150)

## 2020-08-23 LAB — HEPARIN LEVEL (UNFRACTIONATED): Heparin Unfractionated: 0.55 IU/mL (ref 0.30–0.70)

## 2020-08-23 LAB — HEPATITIS B SURFACE ANTIGEN: Hepatitis B Surface Ag: NONREACTIVE

## 2020-08-23 IMAGING — CT CT HEAD W/O CM
3 series · 16 of 47 positions shown, 19 images · non-contrast
Comparison: CT head [DATE]

CLINICAL DATA: Mental status change.  COVID positive

EXAM:
CT HEAD WITHOUT CONTRAST
TECHNIQUE: Contiguous axial images were obtained from the base of the skull
through the vertex without intravenous contrast.

[Series 2: head wo · axial · 0.42mm/px · z∈[-180,-55]mm · 10 of 31 slices shown, 13 images]
[im 3/31  brain]
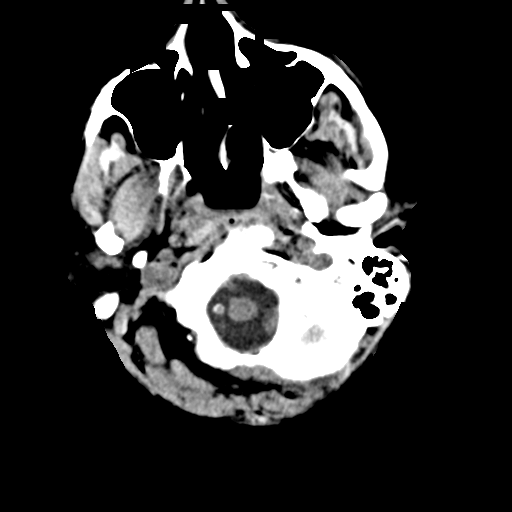
[im 3/31  bone]
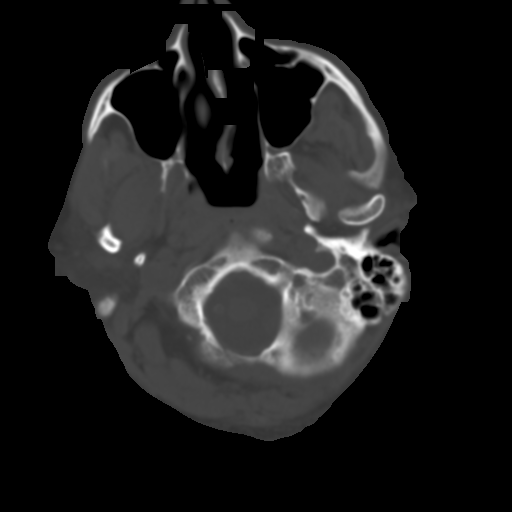
[im 6/31  brain]
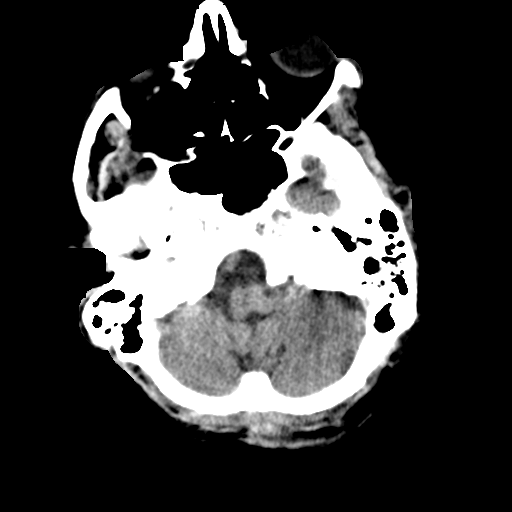
[im 9/31  brain]
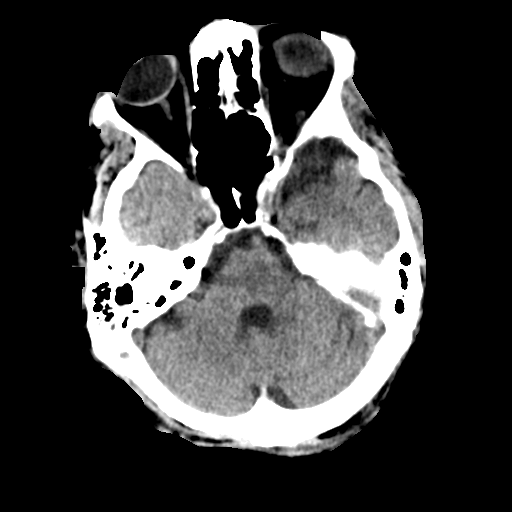
[im 11/31  brain]
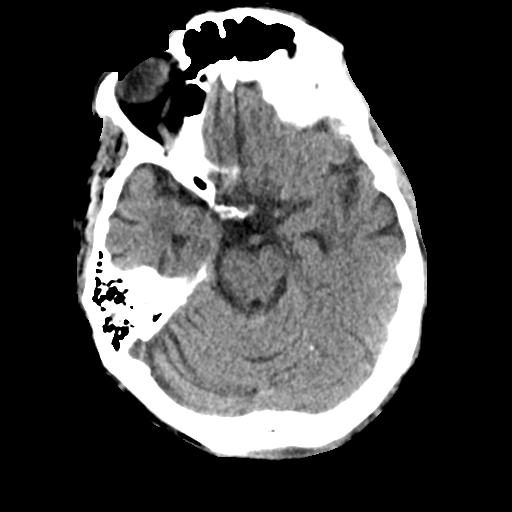
[im 14/31  brain]
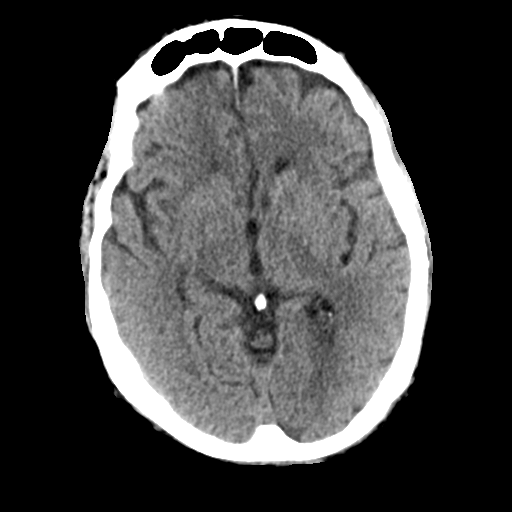
[im 14/31  bone]
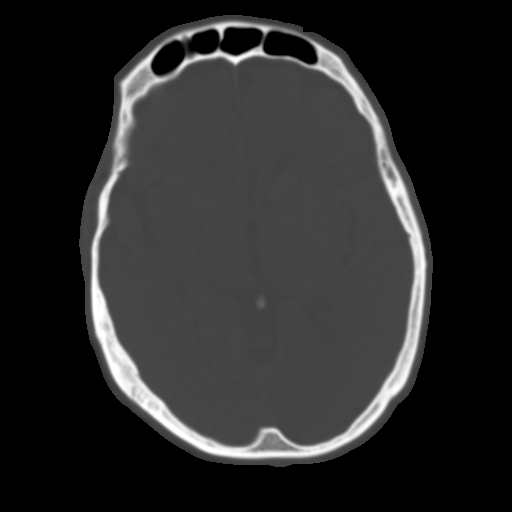
[im 17/31  brain]
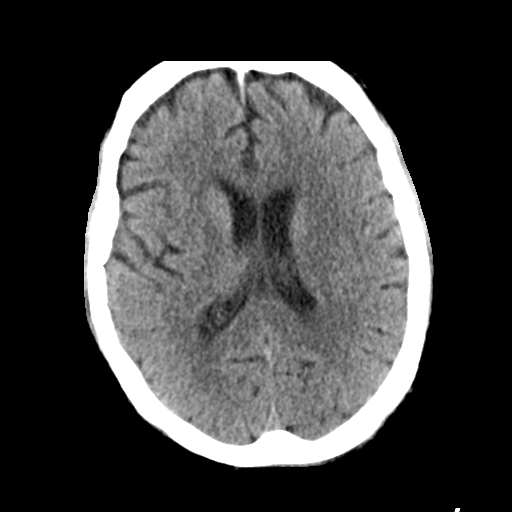
[im 20/31  brain]
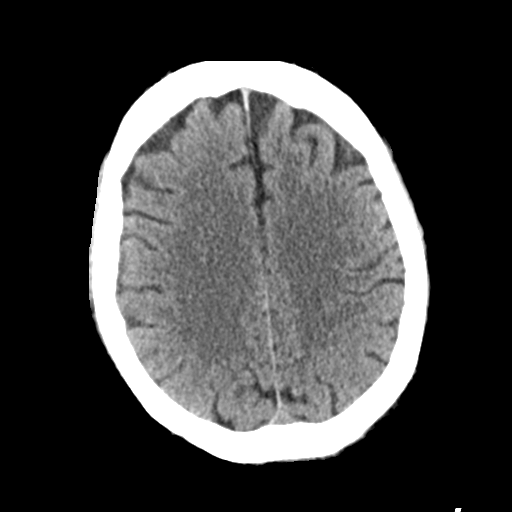
[im 23/31  brain]
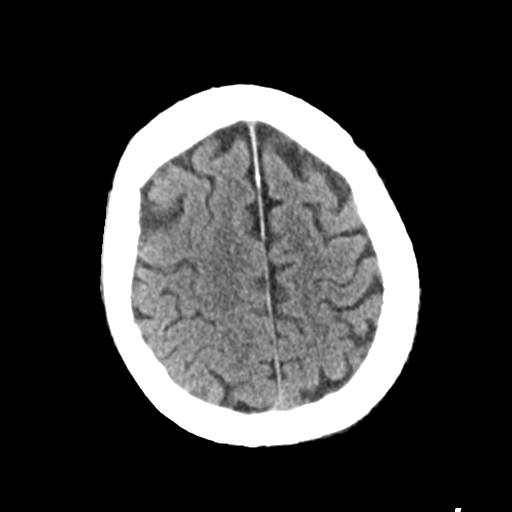
[im 25/31  brain]
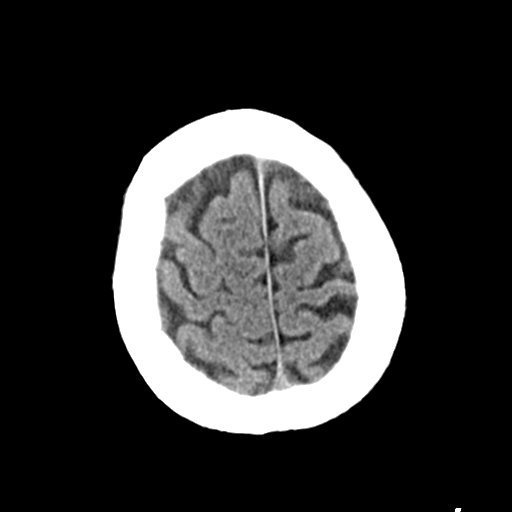
[im 25/31  bone]
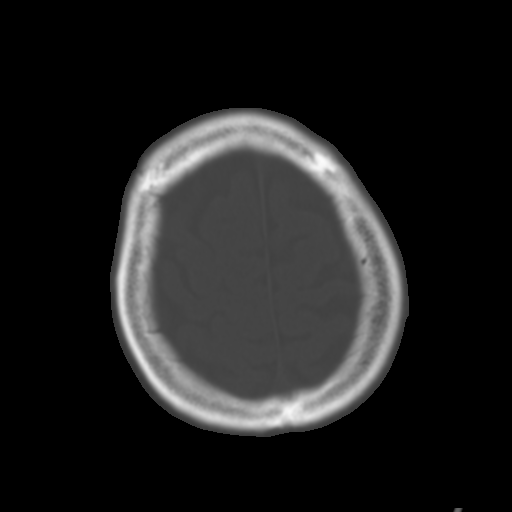
[im 28/31  brain]
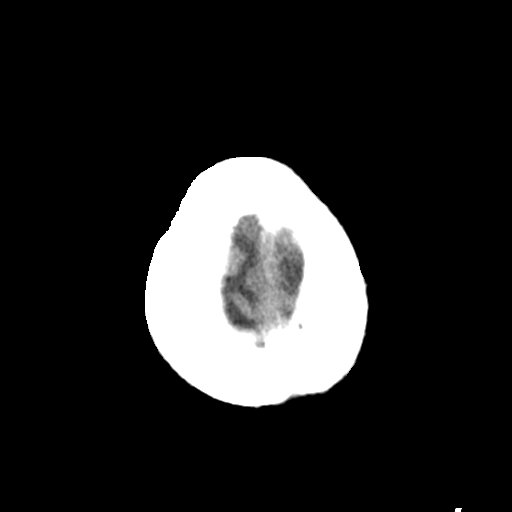

[Series 4: coronal soft tissue · coronal · 0.29mm/px · 3 of 71 slices shown]
[im 28/71  brain]
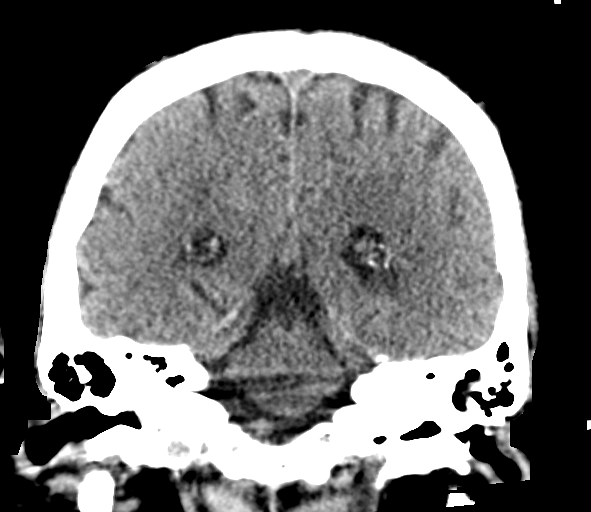
[im 33/71  brain]
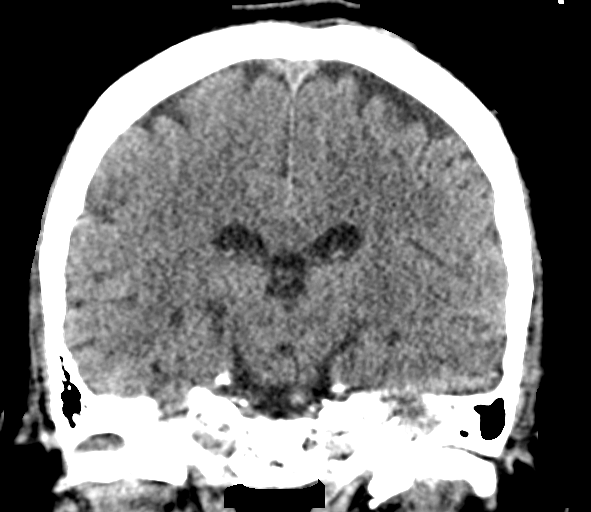
[im 38/71  brain]
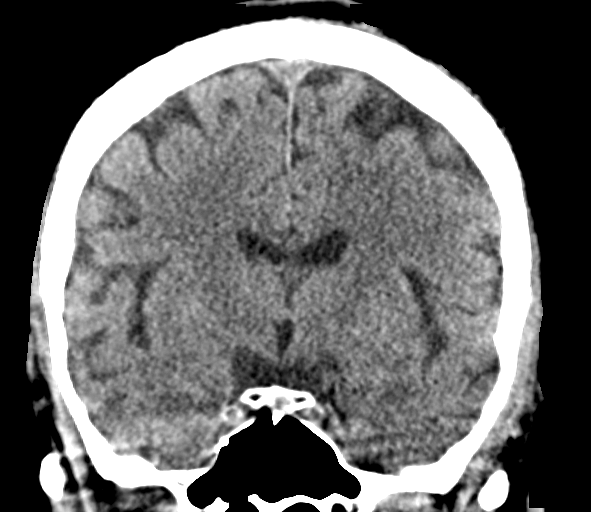

[Series 5: sagittal soft tissue · sagittal · 0.29mm/px · 3 of 60 slices shown]
[im 20/60  brain]
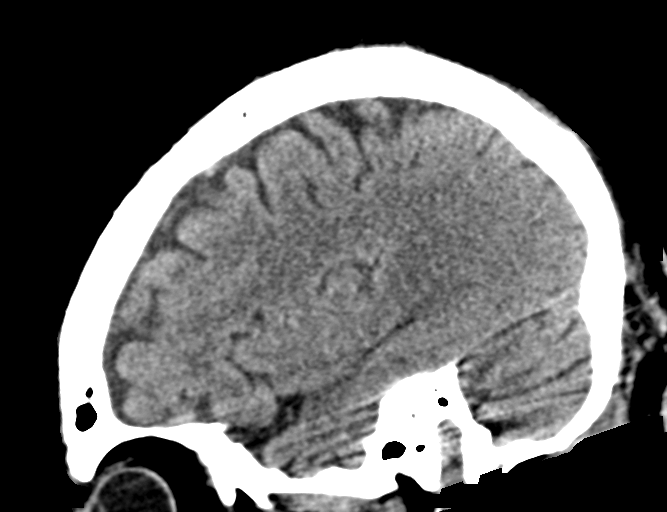
[im 30/60  brain]
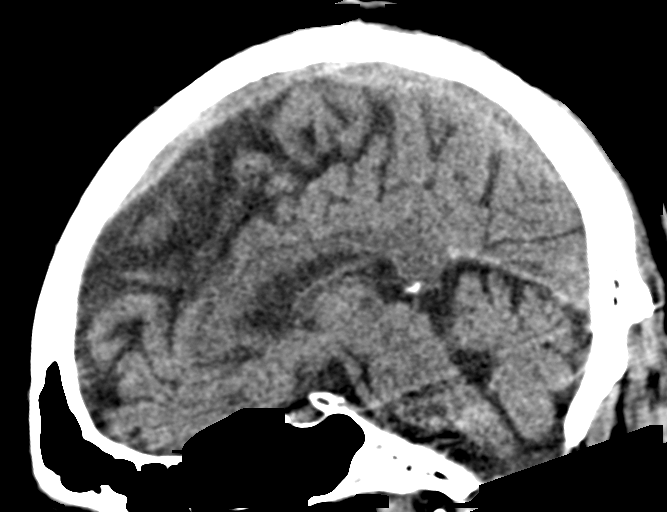
[im 40/60  brain]
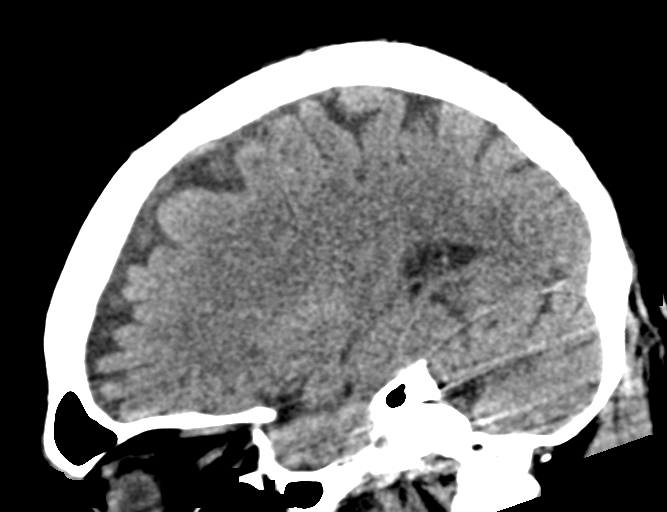

[16 of 47 positions shown; findings below may reference images not displayed]

FINDINGS: Brain: No evidence of acute infarction, hemorrhage, hydrocephalus,
extra-axial collection or mass lesion/mass effect.

Vascular: Negative for hyperdense vessel

Skull: Negative

Sinuses/Orbits: Paranasal sinuses clear. Negative orbit. Left
cataract extraction.

Other: None
IMPRESSION: Negative CT head

## 2020-08-23 MED ORDER — POLYETHYLENE GLYCOL 3350 17 G PO PACK
17.0000 g | PACK | Freq: Every day | ORAL | Status: DC
  Administered 2020-08-23 – 2020-08-24 (×2): 17 g
  Filled 2020-08-23 (×2): qty 1

## 2020-08-23 MED ORDER — CALCIUM GLUCONATE-NACL 1-0.675 GM/50ML-% IV SOLN
1.0000 g | Freq: Once | INTRAVENOUS | Status: AC
  Administered 2020-08-23: 1000 mg via INTRAVENOUS
  Filled 2020-08-23: qty 50

## 2020-08-23 MED ORDER — SODIUM BICARBONATE 8.4 % IV SOLN
50.0000 meq | Freq: Once | INTRAVENOUS | Status: AC
  Administered 2020-08-23: 50 meq via INTRAVENOUS
  Filled 2020-08-23: qty 50

## 2020-08-23 MED ORDER — DEXMEDETOMIDINE HCL IN NACL 400 MCG/100ML IV SOLN: 0.4000 ug/kg/h | INTRAVENOUS | Status: DC

## 2020-08-23 MED ORDER — PROPOFOL 1000 MG/100ML IV EMUL: 5.0000 ug/kg/min | INTRAVENOUS | Status: DC

## 2020-08-23 NOTE — Progress Notes (Addendum)
Not any more alert today. Moves mouth to to mouth care. Bit down on suction swab at 1800 mouth care. Dialysis stayed they removed 1.5 liters of fluid today. Wife in room all day. CT of head at 1430 was negative.

## 2020-08-23 NOTE — Procedures (Signed)
eeg to be done tomorrow

## 2020-08-23 NOTE — Progress Notes (Signed)
NAME:  Nathaniel Thomas, MRN:  [id number], DOB:  07/10/1951, LOS: 5 ADMISSION DATE:  08/25/2020, CONSULTATION DATE:  08/28/2020 REFERRING MD:  Raliegh Ip CHIEF COMPLAINT:  Respiratory failure  Brief History:  70 y.o.malewith PMH as noted below who presents with altered mental status after he was found unresponsive this morning.   Per EMS he was initially hypoxic although this appears to have resolved. The patient himself is unable to give any history.  Patient Dx with COVID 1st week of Jan 2022 +NVD for several days ER shows severe acidosis with elevated sugars Patient with severe hypothermia and intubated for severe resp failure  Past Medical History:  He,  has a past medical history of CAD (coronary artery disease), Cataract, CHICKENPOX, HX OF (08/30/2008), Chronic kidney disease, Diabetes mellitus, Glaucoma, History of urinary calculi (08/30/2008), Hyperlipidemia, Hypertension, Renal calculus or stone, and Skin cancer of nose.   Significant Hospital Events:  1/10 admitted to ICU, severe DKA, gastroentertitis 1/11 severe resp failure 1/12 severe DKA 1/13 severe resp failure, worsening Renal failure 1/14- Reviewed care plan with cardiologist Dr Ubaldo Glassing, plan for cardiac cardoversion. Patient remains in shock with AFRVR on MV, medically optimizing for procedure.  I met with daughter York Cerise.   1/15- patient improved slightly, now with rate controlled AF off Neo. Discussed case with cardiology.  08/19/20- vitals are improved, rate controlled.  BP normalized.  Ventilator weaned to FiO2 60% remains crtically ill.   08/23/20 - vent weaned to 40% FiO2 and PEEP 5  Consults:  Cardiology Nephrology  Procedures:  08/16/20 CVL 08/27/2020 CVL 08/25/2020 ETT  Significant Diagnostic Tests:  CT Head 08/26/2020 Negative motion degraded head CT.  Micro Data:  Tracheal Aspirate 08/18/20 - Serratia Marcescens   Antimicrobials:  Vancomycin 1/10 Cefepime 1/10   Interim History / Subjective:  No acute events  overnight  Dialysis planned for today. Potassium remains elevated.   Objective   Blood pressure 130/60, pulse 76, temperature 99.5 F (37.5 C), resp. rate 20, height 6' 3.98" (1.93 m), weight 112.3 kg, SpO2 95 %.    Vent Mode: PRVC FiO2 (%):  [40 %-50 %] 40 % Set Rate:  [22 bmp] 22 bmp Vt Set:  [520 mL] 520 mL PEEP:  [5 cmH20-10 cmH20] 5 cmH20   Intake/Output Summary (Last 24 hours) at 08/23/2020 0948 Last data filed at 08/23/2020 0600 Gross per 24 hour  Intake 1372.99 ml  Output 125 ml  Net 1247.99 ml   Filed Weights   08/21/20 0410 08/22/20 0500 08/23/20 0500  Weight: 106.9 kg 108.5 kg 112.3 kg    Examination: General: chronically ill appearing male, sedated HENT: [place]/AT, ET tube in place, moist mucous membranes Lungs: Course breath sounds. No wheezing Cardiovascular: RRR, s1s2 Abdomen: soft, mildly distended, BS+ Extremities: 1+ edema Neuro: sedated, not responsive GU: foley in place, penis is edematous  Resolved Hospital Problem list     Assessment & Plan:  Acute hypoxemic Respiratory failure due to Covid 19 Pneumonia - Continue mechanical ventilatory support, on 40% FiO2 and PEEP 5 - Will start to wean sedation despite issues with agonal breathing  - Serratia Marcesans pneumonia, treating with cefepime - Awaiting respiratory culture results from 1/18  Acute Kidney Injury, oliguric Hyperkalemia - Nephrology following - iHD planned for today - Continue lokelma 47m TID  Hypoglycemia - Insulin has been on hold - Blood sugars responding to D10 drip, can hold now that blood sugars are in 200s - Check LFTs  Shock - he has been weaned off vasopressor support -  Stress dose steroids to q12 hours, continue to wean    Encephalopathy In setting of multiorgan failure - requiring high amounts of sedation - Check EEG today - CT 1/10 negative but motion degradation present. Will repeat another head CT today. - Will need to wean off versed if possible in coming  days  Atrial Fibrillation - continue amiodarone PO - on metoprolol 12.34m BID - Off diltiazem gtt - heparin dripp  Type II Diabetes DKA- resolved --Blood sugars stable --Resume SSI and levemir 20 units BID  --Monitor closely given recent hypoglycemic events  Leukocytosis Fever - check blood cultures - PICC line ordered and will remove right fem CVL - will check about removing foley catheter  Best practice (evaluated daily)  Diet: TF Pain/Anxiety/Delirium protocol (if indicated): fentanyl, precedex, versed VAP protocol (if indicated): ordered DVT prophylaxis: heparin GI prophylaxis: PPI  Glucose control: SSI Mobility: bed rest Disposition: ICU Code Status: Full code.  Spoke with daughter and wife on phone 08/22/20. All questions answered. Confirmed full code status for now.   Labs   CBC: Recent Labs  Lab 08/18/20 0429 08/19/20 0302 08/20/20 0319 08/21/20 0400 08/22/20 0352  WBC 16.8* 21.2* 17.4* 25.6* 25.3*  HGB 11.6* 11.4* 11.6* 10.0* 11.1*  HCT 33.5* 32.1* 31.7* 26.9* 29.6*  MCV 86.8 84.3 80.7 79.8* 78.7*  PLT 123* 119* 100* 112* 106*    Basic Metabolic Panel: Recent Labs  Lab 08/19/20 0302 08/19/20 0315 08/20/20 0319 08/21/20 0400 08/21/20 1832 08/22/20 0352 08/22/20 1337 08/22/20 2030 08/23/20 0047 08/23/20 0540  NA  --    < > 139 137   < > 136 132* 134* 132* 133*  K  --    < > 4.9 6.3*   < > 6.1* 6.5* 6.3* 6.1* 6.2*  CL  --    < > 102 98   < > 95* 94* 93* 94* 94*  CO2  --    < > 21* 19*   < > 22 19* 21* 19* 20*  GLUCOSE  --    < > 116* 123*   < > 112* 303* 266* 252* 229*  BUN  --    < > 147* 178*   < > 144* 153* 167* 168* 183*  CREATININE  --    < > 5.93* 6.64*   < > 5.57* 5.91* 6.32* 6.15* 6.33*  CALCIUM  --    < > 6.4* 6.3*   < > 7.0* 7.1* 7.1* 7.3* 7.5*  MG 1.9  --  2.0 2.2  --  2.0  --   --   --  2.4  PHOS  --    < > 7.5* 9.2*  --  8.3* 9.8* 9.6*  --  10.1*   < > = values in this interval not displayed.   GFR: Estimated Creatinine  Clearance: 14.9 mL/min (A) (by C-G formula based on SCr of 6.33 mg/dL (H)). Recent Labs  Lab 08/19/20 0302 08/20/20 0319 08/21/20 0400 08/22/20 0352  WBC 21.2* 17.4* 25.6* 25.3*    Liver Function Tests: Recent Labs  Lab 08/20/20 0319 08/21/20 0400 08/22/20 0352 08/22/20 1337 08/23/20 0540  AST  --   --  158*  --   --   ALT  --   --  166*  --   --   ALKPHOS  --   --  316*  --   --   BILITOT  --   --  1.1  --   --   PROT  --   --  5.3*  --   --   ALBUMIN 1.6* 1.6* 1.5*  1.5* 1.4* 1.4*   No results for input(s): LIPASE, AMYLASE in the last 168 hours. No results for input(s): AMMONIA in the last 168 hours.  ABG    Component Value Date/Time   PHART 7.39 08/23/2020 0830   PCO2ART 32 08/23/2020 0830   PO2ART 86 08/23/2020 0830   HCO3 19.4 (L) 08/23/2020 0830   ACIDBASEDEF 4.9 (H) 08/23/2020 0830   O2SAT 96.4 08/23/2020 0830     Coagulation Profile: No results for input(s): INR, PROTIME in the last 168 hours.  Cardiac Enzymes: Recent Labs  Lab 08/16/20 1339  CKMB 28.3*    HbA1C: Hemoglobin A1C  Date/Time Value Ref Range Status  08/10/2017 11:53 AM 7.5  Final  04/09/2017 08:30 AM 7.3  Final   Hgb A1c MFr Bld  Date/Time Value Ref Range Status  01/11/2015 08:33 AM 7.5 (H) 4.6 - 6.5 % Final    Comment:    Glycemic Control Guidelines for People with Diabetes:Non Diabetic:  <6%Goal of Therapy: <7%Additional Action Suggested:  >8%   07/31/2014 03:27 PM 7.2 (H) 4.6 - 6.5 % Final    Comment:    Glycemic Control Guidelines for People with Diabetes:Non Diabetic:  <6%Goal of Therapy: <7%Additional Action Suggested:  >8%     CBG: Recent Labs  Lab 08/22/20 1612 08/22/20 1939 08/23/20 0043 08/23/20 0353 08/23/20 0812  GLUCAP 288* 249* 238* 229* 218*    Critical care time: 35 minutes    Freda Jackson, MD [facility] Office: [phone]   See Amion for Pager Details

## 2020-08-23 NOTE — Progress Notes (Signed)
ANTICOAGULATION CONSULT NOTE  Pharmacy Consult for heparin Indication: atrial fibrillation  Patient Measurements: Heparin Dosing Weight: 90 kg  Labs: Recent Labs    08/21/20 0400 08/21/20 1510 08/22/20 0032 08/22/20 0352 08/22/20 1337 08/22/20 2030 08/23/20 0047 08/23/20 0540  HGB 10.0*  --   --  11.1*  --   --   --   --   HCT 26.9*  --   --  29.6*  --   --   --   --   PLT 112*  --   --  106*  --   --   --   --   HEPARINUNFRC 0.27*   < > 0.38 0.34  --   --   --  0.55  CREATININE 6.64*   < >  --  5.57* 5.91* 6.32* 6.15*  --    < > = values in this interval not displayed.   Estimated Creatinine Clearance: 15.3 mL/min (A) (by C-G formula based on SCr of 6.15 mg/dL (H)).  Medical History: Past Medical History:  Diagnosis Date  . CAD (coronary artery disease)   . Cataract   . CHICKENPOX, HX OF 08/30/2008   Qualifier: Diagnosis of  By: [name], [name]    . Chronic kidney disease   . Diabetes mellitus   . Glaucoma   . History of urinary calculi 08/30/2008   Qualifier: Diagnosis of  By: [name], Jacelyn Pi   . Hyperlipidemia   . Hypertension   . Renal calculus or stone   . Skin cancer of nose    ears and hands   Assessment: 70 year old male admitted with severe DKA, which has since resolved and patient now on SQ insulin regimen. Patient with worsening renal function, plan to start HD 1/13. Patient with new afib 1/13, started on amiodarone and heparin.   Date Time HL Rate/comment 1/15 0429 0.42 @1200  units/hr - plts trending down 1/16 0302 0.28 @1200  units/hr 1/16 1400 0.36 @1300  units/hr 1/16 2226 0.33 @1300  units/hr 1/17 0319 0.31 @1300  units/hr - plts trending down 1/18 0400 0.27 @1300  units/hr - plts improved, H/H slightly worse 1/18 1510 0.44 @1400  units/hr - H/H trending down, plts stable 1/19 0032 0.38 @1400  units/hr - therapeutic x 2 1/20 0540 0.55 @1400  units/hr - therapeutic x 3  Goal of Therapy:  Heparin level 0.3-0.7 units/ml Monitor platelets by  anticoagulation protocol: Yes   Plan:  Continue heparin infusion at 1400 units/hr Check HL and CBC daily per protocol   Ena Dawley, PharmD 08/23/2020,6:25 AM

## 2020-08-23 NOTE — Progress Notes (Signed)
[address] [place], [place] [address] Phone [phone]. Fax [phone]  Date: 08/23/2020                  Patient Name:  Nathaniel Thomas  MRN: [id number]  DOB: 18-Sep-1950  Age / Sex: 70 y.o., male         PCP: Dion Body, MD                  Presenting Illness: Patient is a 70 y.o. male  admitted to [facility] on 09/02/2020 for evaluation of Metabolic acidosis [E70.3] Encounter for central line placement [Z45.2] Acute renal failure (ARF) ([place]) [N17.9] Acute respiratory failure with hypoxia ([place]) [J96.01] Sepsis, due to unspecified organism, unspecified whether acute organ dysfunction present ([place]) [A41.9] COVID-19 [U07.1]   Patient presented to the ER via EMS from home for altered mental status.  Recently treated for COVID.  Per triage notes, patient was prescribed antibiotics for UTI but is unable to take due to lethargy.  Upon arrival patient was responsive to painful stimuli. Also reported to have nausea vomiting and diarrhea for several days prior to admission.  Initial evaluation showed severe acidosis with elevated blood sugars.  Also noted to have hypothermia.  He was intubated for severe acute respiratory failure.  Initially on presentation, pH of 6.99. Patient was treated with ivermectin as outpatient for COVID.  He is unvaccinated.  Hospital course:  1/14- now on amiodarone, vasopressin. Ketamine, fentanyl, phenylephrine. UOP remains poor.  1/15-  Remains critically ill.  Still on the vent. Due for another dialysis session today. Poor urine output.  1/16-patient underwent hemodialysis yesterday.  Remains critically ill.  Still on the ventilator.  Remains oliguric.  1/17- patient continues to have significant renal dysfunction.  Urine output only 225 cc over the preceding 24 hours.  Still on the ventilator. 1/18- critical illness persist.  Patient still requiring vent support.  Patient due for dialysis treatment today.  Urine output remains diminished at 175  cc over the preceding 24 hours. 1/19- patient remains critically ill.  Still requiring significant vent support with FiO2 of 60%.  Completed dialysis yesterday.  Remains oliguric. 1/20- patient requiring ventilatory support.  FiO2 40%.  Due for hemodialysis treatment today.  Patient continues to have low urine output.  01/19 0701 - 01/20 0700 In: 1423 [I.V.:673; NG/GT:600; IV Piggyback:150] Out: 125 [Urine:125]    Vital Signs: Blood pressure 130/60, pulse 76, temperature 99.5 F (37.5 C), resp. rate 20, height 6' 3.98" (1.93 m), weight 112.3 kg, SpO2 95 %.   Intake/Output Summary (Last 24 hours) at 08/23/2020 1249 Last data filed at 08/23/2020 1118 Gross per 24 hour  Intake 1405.99 ml  Output 125 ml  Net 1280.99 ml    Weight trends: Filed Weights   08/21/20 0410 08/22/20 0500 08/23/20 0500  Weight: 106.9 kg 108.5 kg 112.3 kg    Physical Exam: General:  Critically ill-appearing  HEENT  ET tube in place  Lungs:  Ventilator assisted, FiO2 40%  Heart::  Regular  Abdomen:  Nondistended  Extremities:  1+ edema  Neurologic:  Sedated  Skin:  Warm, dry  Access:  Left IJ temp Cath 1/13- ICU team  Foley:  In place       Lab results: Basic Metabolic Panel: Recent Labs  Lab 08/21/20 0400 08/21/20 1832 08/22/20 0352 08/22/20 1337 08/22/20 2030 08/23/20 0047 08/23/20 0540  NA 137   < > 136 132* 134* 132* 133*  K 6.3*   < > 6.1* 6.5*  6.3* 6.1* 6.2*  CL 98   < > 95* 94* 93* 94* 94*  CO2 19*   < > 22 19* 21* 19* 20*  GLUCOSE 123*   < > 112* 303* 266* 252* 229*  BUN 178*   < > 144* 153* 167* 168* 183*  CREATININE 6.64*   < > 5.57* 5.91* 6.32* 6.15* 6.33*  CALCIUM 6.3*   < > 7.0* 7.1* 7.1* 7.3* 7.5*  MG 2.2  --  2.0  --   --   --  2.4  PHOS 9.2*  --  8.3* 9.8* 9.6*  --  10.1*   < > = values in this interval not displayed.    Liver Function Tests: Recent Labs  Lab 08/22/20 0352 08/22/20 1337 08/23/20 0540  AST 158*  --   --   ALT 166*  --   --   ALKPHOS 316*  --    --   BILITOT 1.1  --   --   PROT 5.3*  --   --   ALBUMIN 1.5*  1.5*   < > 1.4*   < > = values in this interval not displayed.   No results for input(s): LIPASE, AMYLASE in the last 168 hours. No results for input(s): AMMONIA in the last 168 hours.  CBC: Recent Labs  Lab 08/21/20 0400 08/22/20 0352  WBC 25.6* 25.3*  HGB 10.0* 11.1*  HCT 26.9* 29.6*  MCV 79.8* 78.7*  PLT 112* 106*    Cardiac Enzymes: No results for input(s): CKTOTAL, TROPONINI in the last 168 hours.  BNP: Invalid input(s): POCBNP  CBG: Recent Labs  Lab 08/22/20 1939 08/23/20 0043 08/23/20 0353 08/23/20 0812 08/23/20 1128  GLUCAP 249* 238* 229* 218* 207*    Microbiology: Recent Results (from the past 720 hour(s))  Blood Culture (routine x 2)     Status: None   Collection Time: 08/23/2020  9:50 AM   Specimen: BLOOD  Result Value Ref Range Status   Specimen Description BLOOD LEFT [facility]  Final   Special Requests   Final    BOTTLES DRAWN AEROBIC AND ANAEROBIC Blood Culture adequate volume   Culture   Final    NO GROWTH 5 DAYS Performed at [facility], [address] [place], [place] [address]    Report Status 08/18/2020 FINAL  Final  Blood Culture (routine x 2)     Status: None   Collection Time: 08/07/2020  9:50 AM   Specimen: BLOOD  Result Value Ref Range Status   Specimen Description BLOOD RIGHT AC  Final   Special Requests   Final    BOTTLES DRAWN AEROBIC AND ANAEROBIC Blood Culture results may not be optimal due to an excessive volume of blood received in culture bottles   Culture   Final    NO GROWTH 5 DAYS Performed at [facility], [address], [place], [place] [address]    Report Status 08/18/2020 FINAL  Final  Urine culture     Status: None   Collection Time: 08/06/2020 11:36 AM   Specimen: Urine, Random  Result Value Ref Range Status   Specimen Description   Final    URINE, RANDOM Performed at [facility], [address] [place], [place]  [address]    Special Requests   Final    NONE Performed at [facility], [address], [place],  [address]    Culture   Final    NO GROWTH Performed at [facility], [address] [place], [place] [address]    Report Status 08/14/2020 FINAL  Final  Culture, respiratory     Status: None   Collection Time: 08/17/20  3:15 PM   Specimen: Tracheal Aspirate; Respiratory  Result Value Ref Range Status   Specimen Description   Final    TRACHEAL ASPIRATE Performed at [facility], [address] [place], [place] [address]    Special Requests   Final    NONE Performed at [facility], [place], [place] [address]    Gram Stain NO WBC SEEN MODERATE GRAM VARIABLE ROD   Final   Culture   Final    ABUNDANT SERRATIA MARCESCENS SUSCEPTIBILITIES PERFORMED ON PREVIOUS CULTURE WITHIN THE LAST 5 DAYS. Performed at [facility], [place] [address] [place], [place] [address]    Report Status 08/20/2020 FINAL  Final  Culture, respiratory     Status: None   Collection Time: 08/18/20 11:09 AM   Specimen: Tracheal Aspirate; Respiratory  Result Value Ref Range Status   Specimen Description   Final    TRACHEAL ASPIRATE Performed at [facility], [address] [place], [place] [address]    Special Requests   Final    NONE Performed at [facility], [place], [place] [address]    Gram Stain   Final    MODERATE WBC PRESENT, PREDOMINANTLY PMN MODERATE GRAM NEGATIVE RODS FEW GRAM VARIABLE ROD RARE GRAM POSITIVE COCCI Performed at [facility], [place] [address], [place], [place] [address]    Culture ABUNDANT SERRATIA MARCESCENS  Final   Report Status 08/20/2020 FINAL  Final   Organism ID, Bacteria SERRATIA MARCESCENS  Final      Susceptibility   Serratia marcescens - MIC*    CEFAZOLIN >=64 RESISTANT Resistant     CEFEPIME <=0.12 SENSITIVE Sensitive     CEFTAZIDIME <=1 SENSITIVE Sensitive      CEFTRIAXONE <=0.25 SENSITIVE Sensitive     CIPROFLOXACIN <=0.25 SENSITIVE Sensitive     GENTAMICIN <=1 SENSITIVE Sensitive     TRIMETH/SULFA <=20 SENSITIVE Sensitive     * ABUNDANT SERRATIA MARCESCENS  MRSA PCR Screening     Status: None   Collection Time: 08/18/20 12:52 PM   Specimen: Nasopharyngeal  Result Value Ref Range Status   MRSA by PCR NEGATIVE NEGATIVE Final    Comment:        The GeneXpert MRSA Assay (FDA approved for NASAL specimens only), is one component of a comprehensive MRSA colonization surveillance program. It is not intended to diagnose MRSA infection nor to guide or monitor treatment for MRSA infections. Performed at [facility], [address] [place], [place] [address]   Culture, respiratory (non-expectorated)     Status: None (Preliminary result)   Collection Time: 08/21/20  4:52 PM   Specimen: Tracheal Aspirate; Respiratory  Result Value Ref Range Status   Specimen Description   Final    TRACHEAL ASPIRATE Performed at [facility], [address] [place], [place] [address]    Special Requests   Final    NONE Performed at [facility], [address] [place], [place] [address]    Gram Stain   Final    FEW WBC PRESENT, PREDOMINANTLY PMN ABUNDANT GRAM VARIABLE ROD MODERATE GRAM POSITIVE COCCI Performed at [facility], [place] [address] [place], [place] [address]    Culture Emmit Pomfret NEGATIVE RODS  Final   Report Status PENDING  Incomplete  CULTURE, BLOOD (ROUTINE X 2) w Reflex to ID Panel     Status: None (Preliminary result)   Collection Time: 08/22/20  1:37 PM   Specimen: BLOOD  Result Value Ref Range Status   Specimen Description BLOOD BLOOD LEFT HAND  Final   Special Requests   Final    BOTTLES DRAWN AEROBIC AND ANAEROBIC Blood Culture adequate volume   Culture   Final    NO GROWTH < 24 HOURS Performed at [facility], [address], [place], [place] [address]    Report Status PENDING   Incomplete  CULTURE, BLOOD (ROUTINE X 2) w Reflex to ID Panel     Status: None (Preliminary result)   Collection Time: 08/22/20  1:37 PM   Specimen: BLOOD  Result Value Ref Range Status   Specimen Description BLOOD LEFT ANTECUBITAL  Final   Special Requests   Final    BOTTLES DRAWN AEROBIC AND ANAEROBIC Blood Culture adequate volume   Culture   Final    NO GROWTH < 24 HOURS Performed at [facility], [address] [place], [place] [address]    Report Status PENDING  Incomplete     Coagulation Studies: No results for input(s): LABPROT, INR in the last 72 hours.  Urinalysis: No results for input(s): COLORURINE, LABSPEC, PHURINE, GLUCOSEU, HGBUR, BILIRUBINUR, KETONESUR, PROTEINUR, UROBILINOGEN, NITRITE, LEUKOCYTESUR in the last 72 hours.  Invalid input(s): APPERANCEUR      Imaging: DG Abd 1 View  Result Date: 08/21/2020 CLINICAL DATA:  Orogastric tube placement EXAM: ABDOMEN - 1 VIEW COMPARISON:  Portable exam 1437 hours compared to 08/18/2020 FINDINGS: Tip of orogastric tube projects over stomach. RIGHT basilar opacity question pneumonia versus atelectasis. Nonobstructive bowel gas pattern. Osseous structures unremarkable. IMPRESSION: Orogastric tube projects over proximal stomach RIGHT basilar opacity question atelectasis versus pneumonia. Electronically Signed   By: Lavonia Dana M.D.   On: 08/21/2020 14:46   DG Chest Port 1 View  Result Date: 08/22/2020 CLINICAL DATA:  Respiratory failure. EXAM: PORTABLE CHEST 1 VIEW COMPARISON:  08/20/2020 FINDINGS: Hemidiaphragms not completely imaged. Endotracheal tube, NG tube, left IJ line stable position. Heart size normal. Diffuse bilateral pulmonary infiltrates again noted. No significant interim change. No prominent pleural effusion. No pneumothorax. IMPRESSION: 1. Diffuse bilateral pulmonary infiltrates again noted. No significant interim change. 2. Lines and tubes in stable position. Electronically Signed   By: Marcello Moores  Register    On: 08/22/2020 06:18   [place] EKG SITE RITE  Result Date: 08/23/2020 If Site Rite image not attached, placement could not be confirmed due to current cardiac rhythm.  Scheduled Meds: . amiodarone  200 mg Per Tube BID  . chlorhexidine gluconate (MEDLINE KIT)  15 mL Mouth Rinse BID  . Chlorhexidine Gluconate Cloth  6 each Topical Daily  . docusate  100 mg Per Tube BID  . feeding supplement (PROSource TF)  90 mL Per Tube BID  . hydrocortisone sod succinate (SOLU-CORTEF) inj  50 mg Intravenous Q12H  . insulin aspart  0-20 Units Subcutaneous Q4H  . insulin detemir  20 Units Subcutaneous BID  . mouth rinse  15 mL Mouth Rinse 10 times per day  . metoprolol tartrate  12.5 mg Per NG tube BID  . multivitamin  1 tablet Per Tube QHS  . polyethylene glycol  17 g Per Tube Daily  . sodium chloride flush  3 mL Intravenous Q12H  . sodium zirconium cyclosilicate  10 g Per Tube TID   Continuous Infusions: . sodium chloride    .  ceFEPime (MAXIPIME) IV 1 g (08/22/20 1649)  . dexmedetomidine (PRECEDEX) IV infusion    . famotidine (PEPCID) IV 20 mg (08/23/20 1129)  . feeding supplement (NEPRO CARB STEADY) 1,000 mL (08/21/20 1659)  . fentaNYL infusion INTRAVENOUS 225 mcg/hr (08/23/20 0530)  . heparin 1,400 Units/hr (08/22/20 1646)  . midazolam 3 mg/hr (08/23/20 1103)   PRN Meds:.sodium chloride, acetaminophen (TYLENOL) oral liquid 160 mg/5 mL, docusate, fentaNYL, heparin, metoprolol tartrate, midazolam, sodium chloride flush   Assessment & Plan: Pt is a 70 y.o.   male with coronary artery disease, diabetes, glaucoma, history of kidney stones, hyperlipidemia, hypertension, left knee replacement, lithotripsy, basal cell carcinoma, was admitted on 01/28/349 with Metabolic acidosis [K93.8] Encounter for central line placement [Z45.2] Acute renal failure (ARF) ([place]) [N17.9] Acute respiratory failure with hypoxia ([place]) [J96.01] Sepsis, due to unspecified organism, unspecified whether acute organ dysfunction  present ([place]) [A41.9] COVID-19 [U07.1]   #Acute kidney injury Baseline creatinine 1.0 from August 02, 2020 Presenting creatinine of 2.15 Lab Results  Component Value Date   CREATININE 6.33 (H) 08/23/2020   CREATININE 6.15 (H) 08/23/2020   CREATININE 6.32 (H) 08/22/2020     #Acute respiratory failure Requiring ventilator support.  Intubated 08/14/2020  #COVID-19 pneumonia Treated with ivermectin as outpatient  #Diabetes type 2, insulin dependent Treated with metformin, Jardiance (empagliflozin), Amaryl as outpatient Hemoglobin A1c 7.9% from June 26, 2020 Lab Results  Component Value Date   HGBA1C 7.5 08/10/2017    #Severe acidosis/hyperkalemia Treated with dialysis and Lokelma    Plan: Patient remains critically ill due to COVID-19 pneumonia.  In regards to his underlying renal function he continues to have low urine output of only 125 cc over the preceding 24 hours.  Creatinine remains high at 6.3 and potassium also high at 6.2.  We will plan for hemodialysis treatment today which should help with both azotemia and hyperkalemia.  Continue weaning as per pulmonary/critical care.   LOS: 10 Tahesha Skeet 1/20/202212:49 PM    Note: This note was prepared with Dragon dictation. Any transcription errors are unintentional

## 2020-08-23 NOTE — Consult Note (Signed)
Consultation Note Date: 08/23/2020   Patient Name: Nathaniel Thomas  DOB: 04-04-1951  MRN: [id number]  Age / Sex: 70 y.o., male  PCP: Nathaniel Body, MD Referring Physician: Freddi Starr, MD  Reason for Consultation: Establishing goals of care  HPI/Patient Profile: 70 y.o. male  with past medical history of CAD, CKD, DM, glaucoma, cataract, urinary calculi, HLD< HTN, skin cancer admitted on 08/22/2020 with AMS and unresponsiveness. Diagnosed with COVID-19 first week of January 2022. ICU admission for severe DKA and gastroenteritis. Required intubation/mechanical ventilation on 1/11 for severe respiratory failure. Tracheal aspirate 1/15 with serratia marcescens. AKI with oliguria. Nephrology following and managing hemodialysis initiation. Patient remains encephalopathic and requiring high amounts of sedation to remain comfortable on ventilator. Pending repeat CT head and EEG 1/20. Palliative medicine consultation for goals of care.   Clinical Assessment and Goals of Care:  I have reviewed medical records, discussed during multidisciplinary rounds with Dr. Erin Thomas and RN, and met with wife Nathaniel Thomas) at bedside to discuss goals of care.   I introduced Palliative Medicine as specialized medical care for people living with serious illness. It focuses on providing relief from the symptoms and stress of a serious illness. The goal is to improve quality of life for both the patient and the family.  We discussed a brief life review of the patient. Married for over 50 years! Two daughters. Patient and his wife recently [place] be closer to daughters. Prior to hospitalization, wife reports he was independent and in good health. [place]iel [place].   Discussed events leading up to admission and course of hospitalization including diagnoses, interventions, plan of care. Ms.  Nathaniel Thomas shares her belief that he looks much better today (also explaining that he looked like 'death' three days ago). She is glad that he has been tolerating lower ventilator settings.   Mrs. Nathaniel Thomas shares their strong Christian faith and strong hope that he will pull through this. She shares multiple people around the [place] who are praying for him and his recovery.   I attempted to elicit values and goals of care important to the patient and family. Wife acknowledges that she would not wish for Nathaniel Thomas for months and months if no improvement, but that she is not giving up on him. She confirms her decision for ongoing full scope treatment and aggressive measures. She is hoping and praying for a miracle.   Prepared wife for next steps with this being day 9 on the ventilator. Explained need for tracheostomy and feeding tube placement. She is not surprised by this information as it was previously discussed with critical care doctor. Explained that this will likely need to be done by early next week. Wife understands and agrees.   Therapeutic listening and emotional/spiritual support provided. Answered all questions. Wife requests I call daughter, Nathaniel Thomas with an update. 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   **  Updated Dr. Erin Thomas on this conversation.   **This afternoon, spoke with daughter Nathaniel Thomas via telephone. Introduced role of palliative medicine and support we will provide this admission. Provided update for today on her father's Thomas and plan. Nathaniel Thomas has a good understanding of her father's Thomas and guarded prognosis. Nathaniel Thomas also shares the family's deep Nathaniel Thomas faith and belief that "[place]." She shares that if her father comes back, pulls through his Thomas, it will be Nathaniel Thomas's doing. Nathaniel Thomas shares the family is still trying to process and grieve his guarded  prognosis and Thomas. Updated her on my conversation with her mother. She confirms understanding for likely trach/feeding tube placement early next week. Reassured of ongoing intermittent palliative support. Answered questions. PMT contact information given.    SUMMARY OF RECOMMENDATIONS    Continue full code/full scope treatment.  Family wishes for any available and offered medical interventions to prolong life. Explained to wife and daughter that he will likely need trach placement by early next week. They understand and again confirm decision for full scope treatment.   Ongoing intermittent palliative support this admission. PMT contact information given to daughter.   Code Status/Advance Care Planning:  Full code  Symptom Management:   Per attending  Palliative Prophylaxis:   Aspiration, Delirium Protocol, Frequent Pain Assessment, Oral Care and Turn Reposition  Additional Recommendations (Limitations, Scope, Preferences):  Full Scope Treatment  Psycho-social/Spiritual:   Desire for further Chaplaincy support: yes  Additional Recommendations: Caregiving  Support/Resources and Compassionate Wean Education  Prognosis:   Guarded  Discharge Planning: To Be Determined      Primary Diagnoses: Present on Admission: . COVID-19   I have reviewed the medical record, interviewed the patient and family, and examined the patient. The following aspects are pertinent.  Past Medical History:  Diagnosis Date  . CAD (coronary artery disease)   . Cataract   . CHICKENPOX, HX OF 08/30/2008   Qualifier: Diagnosis of  By: [name], [name]    . Chronic kidney disease   . Diabetes mellitus   . Glaucoma   . History of urinary calculi 08/30/2008   Qualifier: Diagnosis of  By: [name], Jacelyn Pi   . Hyperlipidemia   . Hypertension   . Renal calculus or stone   . Skin cancer of nose    ears and hands   Social History   Socioeconomic History  . Marital status: Married     Spouse name: Not on file  . Number of children: 2  . Years of education: Not on file  . Highest education level: Not on file  Occupational History    Comment: Retired  Tobacco Use  . Smoking status: Never Smoker  . Smokeless tobacco: Former Network engineer and Sexual Activity  . Alcohol use: No  . Drug use: No  . Sexual activity: Yes    Comment: 71 year of marriage  Other Topics Concern  . Not on file  Social History Narrative  . Not on file   Social Determinants of Health   Financial Resource Strain: Not on file  Food Insecurity: Not on file  Transportation Needs: Not on file  Physical Activity: Not on file  Stress: Not on file  Social Connections: Not on file   Family History  Problem Relation Age of Onset  . Heart disease Brother        Atrial fibrillation  . Heart disease Father        CHF  . Heart disease Mother   .  Heart disease Brother        atrial fib  . Cancer Sister        bone   Scheduled Meds: . amiodarone  200 mg Per Tube BID  . chlorhexidine gluconate (MEDLINE KIT)  15 mL Mouth Rinse BID  . Chlorhexidine Gluconate Cloth  6 each Topical Daily  . docusate  100 mg Per Tube BID  . feeding supplement (PROSource TF)  90 mL Per Tube BID  . hydrocortisone sod succinate (SOLU-CORTEF) inj  50 mg Intravenous Q12H  . insulin aspart  0-20 Units Subcutaneous Q4H  . insulin detemir  20 Units Subcutaneous BID  . mouth rinse  15 mL Mouth Rinse 10 times per day  . metoprolol tartrate  12.5 mg Per NG tube BID  . multivitamin  1 tablet Per Tube QHS  . polyethylene glycol  17 g Per Tube Daily  . sodium chloride flush  3 mL Intravenous Q12H  . sodium zirconium cyclosilicate  10 g Per Tube TID   Continuous Infusions: . sodium chloride    . ceFEPime (MAXIPIME) IV 1 g (08/22/20 1649)  . dexmedetomidine (PRECEDEX) IV infusion    . famotidine (PEPCID) IV 20 mg (08/23/20 1129)  . feeding supplement (NEPRO CARB STEADY) 1,000 mL (08/21/20 1659)  . fentaNYL infusion  INTRAVENOUS 225 mcg/hr (08/23/20 0530)  . heparin 1,400 Units/hr (08/22/20 1646)  . midazolam 3 mg/hr (08/23/20 1103)   PRN Meds:.sodium chloride, acetaminophen (TYLENOL) oral liquid 160 mg/5 mL, docusate, fentaNYL, heparin, metoprolol tartrate, midazolam, sodium chloride flush Medications Prior to Admission:  Prior to Admission medications   Medication Sig Start Date End Date Taking? Authorizing Provider  aspirin 81 MG chewable tablet Chew 81 mg by mouth daily. 07/14/20  Yes [provider]  Cholecalciferol (VITAMIN D3) 250 MCG (10000 UT) capsule Take 10,000 Units by mouth daily.   Yes [provider]  empagliflozin (JARDIANCE) 25 MG TABS tablet Take 25 mg by mouth daily. 06/30/20  Yes [provider]  finasteride (PROSCAR) 5 MG tablet Take 5 mg by mouth daily.   Yes [provider]  fluticasone (FLONASE) 50 MCG/ACT nasal spray Place 1 spray into both nostrils 2 (two) times daily. 08/02/20  Yes [provider]  glimepiride (AMARYL) 4 MG tablet Take 4 mg by mouth daily. 05/25/20  Yes [provider]  insulin detemir (LEVEMIR) 100 UNIT/ML injection Inject 25 Units into the skin 2 (two) times daily.   Yes [provider]  lisinopril (ZESTRIL) 5 MG tablet Take 5 mg by mouth daily. 07/13/20  Yes [provider]  LUMIGAN 0.01 % SOLN Place 1 drop into both eyes at bedtime.   Yes [provider]  metFORMIN (GLUCOPHAGE) 500 MG tablet Take 1,000 mg by mouth 2 (two) times daily. 06/25/20  Yes [provider]  Pitavastatin Calcium 4 MG TABS Take 4 mg by mouth daily. 05/22/20  Yes [provider]  tamsulosin (FLOMAX) 0.4 MG CAPS capsule Take 0.4 mg by mouth daily after supper.  04/16/14  Yes [provider]  zinc gluconate 50 MG tablet Take 50 mg by mouth daily.   Yes [provider]   Allergies  Allergen Reactions  . Demerol Other (See Comments)    Blood pressure dropped completely out   . Meperidine Other (See Comments)    Drops blood pressure out  Blood pressure dropped completely out  . Meperidine Hcl Other (See Comments)    Drops blood pressure out  . Nitrospan [Nitroglycerin] Other (See  Comments)    Blood pressure dropped out  . Sulfa Antibiotics Other (See Comments)    Urinary burning; blisters   . Sulfasalazine Other (See Comments)    Urinary burning; blisters    Review of Systems  Unable to perform ROS: Acuity of Thomas   Physical Exam Vitals and nursing note reviewed.  Constitutional:      Interventions: He is sedated and intubated.  HENT:     Head: Normocephalic and atraumatic.  Cardiovascular:     Rate and Rhythm: Normal rate.  Pulmonary:     Effort: No tachypnea, accessory muscle usage or respiratory distress. He is intubated.     Breath sounds: Decreased breath sounds present.  Skin:    General: Skin is warm and dry.  Neurological:     Comments: Intubated/sedated    Vital Signs: BP (!) 122/56   Pulse 76   Temp 99.1 F (37.3 C)   Resp (!) 22   Ht 6' 3.98" (1.93 m)   Wt 112.3 kg   SpO2 95%   BMI 30.15 kg/m  Pain Scale: CPOT   Pain Score: 0-No pain   SpO2: SpO2: 95 % O2 Device:SpO2: 95 % O2 Flow Rate: .   IO: Intake/output summary:   Intake/Output Summary (Last 24 hours) at 08/23/2020 1546 Last data filed at 08/23/2020 1200 Gross per 24 hour  Intake 1405.99 ml  Output 245 ml  Net 1160.99 ml    LBM: Last BM Date: 08/18/20 Baseline Weight: Weight: 90.7 kg Most recent weight: Weight: 112.3 kg     Palliative Assessment/Data: PPS 30%   Flowsheet Rows   Flowsheet Row Most Recent Value  Intake Tab   Referral Department Critical care  Unit at Time of Referral ICU  Palliative Care Primary Diagnosis Sepsis/Infectious Disease  Palliative Care Type New Palliative care  Reason for referral Clarify Goals of Care  Date first seen by Palliative Care 08/23/20  Clinical Assessment   Palliative Performance Scale Score 30%   Psychosocial & Spiritual Assessment   Palliative Care Outcomes   Patient/Family meeting held? Yes  Who was at the meeting? wife at bedside, daughter via telephone  Palliative Care Outcomes Clarified goals of care, Provided psychosocial or spiritual support, ACP counseling assistance       Time Total: 70 Greater than 50%  of this time was spent counseling and coordinating care related to the above assessment and plan.  Signed by:  Ihor Dow, DNP, FNP-C Palliative Medicine Team  Phone: [phone] Fax: [phone]   Please contact Palliative Medicine Team phone at [phone] for questions and concerns.  For individual provider: See Shea Evans

## 2020-08-23 NOTE — Progress Notes (Signed)
Pt was transported to CT from CCU and back while on the vent. 

## 2020-08-23 NOTE — Progress Notes (Signed)
Secure chat Dr Holley Raring  States OK to proceed with PICC in dominant arm.

## 2020-08-24 DIAGNOSIS — J9601 Acute respiratory failure with hypoxia: Secondary | ICD-10-CM | POA: Diagnosis not present

## 2020-08-24 DIAGNOSIS — U071 COVID-19: Secondary | ICD-10-CM | POA: Diagnosis not present

## 2020-08-24 DIAGNOSIS — R4182 Altered mental status, unspecified: Secondary | ICD-10-CM

## 2020-08-24 LAB — CBC
HCT: 20.7 % — ABNORMAL LOW (ref 39.0–52.0)
Hemoglobin: 8.2 g/dL — ABNORMAL LOW (ref 13.0–17.0)
MCH: 29.9 pg (ref 26.0–34.0)
MCHC: 39.6 g/dL — ABNORMAL HIGH (ref 30.0–36.0)
MCV: 75.5 fL — ABNORMAL LOW (ref 80.0–100.0)
Platelets: 153 10*3/uL (ref 150–400)
RBC: 2.74 MIL/uL — ABNORMAL LOW (ref 4.22–5.81)
RDW: 13.7 % (ref 11.5–15.5)
WBC: 22.3 10*3/uL — ABNORMAL HIGH (ref 4.0–10.5)
nRBC: 0 % (ref 0.0–0.2)

## 2020-08-24 LAB — RENAL FUNCTION PANEL
Albumin: 1.4 g/dL — ABNORMAL LOW (ref 3.5–5.0)
Anion gap: 18 — ABNORMAL HIGH (ref 5–15)
BUN: 137 mg/dL — ABNORMAL HIGH (ref 8–23)
CO2: 20 mmol/L — ABNORMAL LOW (ref 22–32)
Calcium: 7.3 mg/dL — ABNORMAL LOW (ref 8.9–10.3)
Chloride: 94 mmol/L — ABNORMAL LOW (ref 98–111)
Creatinine, Ser: 5.23 mg/dL — ABNORMAL HIGH (ref 0.61–1.24)
GFR, Estimated: 11 mL/min — ABNORMAL LOW (ref >60)
Glucose, Bld: 138 mg/dL — ABNORMAL HIGH (ref 70–99)
Phosphorus: 7.8 mg/dL — ABNORMAL HIGH (ref 2.5–4.6)
Potassium: 5.1 mmol/L (ref 3.5–5.1)
Sodium: 132 mmol/L — ABNORMAL LOW (ref 135–145)

## 2020-08-24 LAB — GLUCOSE, CAPILLARY
Glucose-Capillary: 116 mg/dL — ABNORMAL HIGH (ref 70–99)
Glucose-Capillary: 124 mg/dL — ABNORMAL HIGH (ref 70–99)
Glucose-Capillary: 162 mg/dL — ABNORMAL HIGH (ref 70–99)
Glucose-Capillary: 179 mg/dL — ABNORMAL HIGH (ref 70–99)
Glucose-Capillary: 180 mg/dL — ABNORMAL HIGH (ref 70–99)

## 2020-08-24 LAB — CULTURE, RESPIRATORY W GRAM STAIN

## 2020-08-24 LAB — HEPARIN LEVEL (UNFRACTIONATED)
Heparin Unfractionated: 0.72 IU/mL — ABNORMAL HIGH (ref 0.30–0.70)
Heparin Unfractionated: 0.12 IU/mL — ABNORMAL LOW (ref 0.30–0.70)

## 2020-08-24 LAB — MAGNESIUM: Magnesium: 2.1 mg/dL (ref 1.7–2.4)

## 2020-08-24 MED ORDER — HEPARIN BOLUS VIA INFUSION
1500.0000 [IU] | Freq: Once | INTRAVENOUS | Status: AC
  Administered 2020-08-24: 1500 [IU] via INTRAVENOUS
  Filled 2020-08-24: qty 1500

## 2020-08-24 MED ORDER — SODIUM CHLORIDE 0.9% FLUSH
10.0000 mL | INTRAVENOUS | Status: DC | PRN
  Administered 2020-08-29 – 2020-08-30 (×2): 10 mL

## 2020-08-24 MED ORDER — MIDAZOLAM HCL 2 MG/2ML IJ SOLN: 1.0000 mg | INTRAMUSCULAR | Status: DC | PRN

## 2020-08-24 MED ORDER — SODIUM CHLORIDE 0.9% FLUSH
10.0000 mL | Freq: Two times a day (BID) | INTRAVENOUS | Status: DC
  Administered 2020-08-24 – 2020-08-30 (×11): 10 mL

## 2020-08-24 MED ORDER — HYDROCORTISONE NA SUCCINATE PF 100 MG IJ SOLR
50.0000 mg | INTRAMUSCULAR | Status: AC
  Administered 2020-08-25 – 2020-08-26 (×2): 50 mg via INTRAVENOUS
  Filled 2020-08-24 (×2): qty 2

## 2020-08-24 NOTE — Consult Note (Addendum)
[facility] Vascular Consult Note  MRN : [id number]  Nathaniel Thomas is a 70 y.o. (10-10-1950) male who presents with chief complaint of  Chief Complaint  Patient presents with  . Altered Mental Status   History of Present Illness:  Of note: Patient is critically ill and on a vent  The patient is a 70 year old male with past medical history of CAD, CKD, DM, glaucoma, cataract, urinary calculi, HLD< HTN, skin cancer admitted on 08/14/2020 with AMS and unresponsiveness. Diagnosed with COVID-19 first week of January 2022. ICU admission for severe DKA and gastroenteritis. Required intubation/mechanical ventilation on 1/11 for severe respiratory failure. Tracheal aspirate 1/15 with serratia marcescens. AKI with oliguria. Nephrology following and managing hemodialysis initiation. Patient remains encephalopathic and requiring high amounts of sedation to remain comfortable on ventilator. Pending EEG 1/20.   Recent physical exam conducted by the intensivist team was notable for bilateral mottling of the feet.  Vascular surgery was consulted by Dr. Patsey Berthold for further evaluation.  Current Facility-Administered Medications  Medication Dose Route Frequency Provider Last Rate Last Admin  . 0.9 %  sodium chloride infusion  250 mL Intravenous PRN Flora Lipps, MD      . acetaminophen (TYLENOL) 160 MG/5ML solution 650 mg  650 mg Per Tube Q4H PRN Flora Lipps, MD   650 mg at 08/24/20 1317  . amiodarone (PACERONE) tablet 200 mg  200 mg Per Tube BID Rust-Chester, Britton L, NP   200 mg at 08/24/20 1111  . ceFEPIme (MAXIPIME) 1 g in sodium chloride 0.9 % 100 mL IVPB  1 g Intravenous Q24H Tyler Pita, MD 200 mL/hr at 08/23/20 1735 1 g at 08/23/20 1735  . chlorhexidine gluconate (MEDLINE KIT) (PERIDEX) 0.12 % solution 15 mL  15 mL Mouth Rinse BID Flora Lipps, MD   15 mL at 08/24/20 0726  . Chlorhexidine Gluconate Cloth 2 % PADS 6 each  6 each Topical Daily Flora Lipps, MD   6 each  at 08/23/20 1316  . dexmedetomidine (PRECEDEX) 400 MCG/100ML (4 mcg/mL) infusion  0.4-1.2 mcg/kg/hr Intravenous Titrated Freddi Starr, MD      . docusate (COLACE) 50 MG/5ML liquid 100 mg  100 mg Per Tube BID Flora Lipps, MD   100 mg at 08/24/20 1111  . docusate (COLACE) 50 MG/5ML liquid 100 mg  100 mg Per Tube BID PRN Flora Lipps, MD      . famotidine (PEPCID) IVPB 20 mg premix  20 mg Intravenous Q24H Flora Lipps, MD 100 mL/hr at 08/24/20 1115 20 mg at 08/24/20 1115  . feeding supplement (NEPRO CARB STEADY) liquid 1,000 mL  1,000 mL Per Tube Continuous Rosine Door, MD 50 mL/hr at 08/21/20 1659 1,000 mL at 08/21/20 1659  . feeding supplement (PROSource TF) liquid 90 mL  90 mL Per Tube BID Rosine Door, MD   90 mL at 08/24/20 1112  . fentaNYL (SUBLIMAZE) bolus via infusion 25 mcg  25 mcg Intravenous Q15 min PRN Flora Lipps, MD   25 mcg at 08/20/20 0247  . fentaNYL 2552mg in NS 2547m(1042mml) infusion-PREMIX  0-250 mcg/hr Intravenous Continuous DewFreddi StarrD 15 mL/hr at 08/24/20 0748 150 mcg/hr at 08/24/20 0748  . heparin ADULT infusion 100 units/mL (25000 units/250m72m1,350 Units/hr Intravenous Continuous HallHart RobinsonsRPH 13.5 mL/hr at 08/24/20 0618 1,350 Units/hr at 08/24/20 0618  . heparin injection 1,000-6,000 Units  1,000-6,000 Units CRRT PRN KeenBradly Bienenstock   2,000 Units at 08/16/20 1116  . [  START ON 08/25/2020] hydrocortisone sodium succinate (SOLU-CORTEF) 100 MG injection 50 mg  50 mg Intravenous Q24H Vernard Gambles L, MD      . insulin aspart (novoLOG) injection 0-20 Units  0-20 Units Subcutaneous Q4H Darel Hong D, NP   3 Units at 08/24/20 1200  . insulin detemir (LEVEMIR) injection 20 Units  20 Units Subcutaneous BID Freddi Starr, MD   20 Units at 08/24/20 1113  . MEDLINE mouth rinse  15 mL Mouth Rinse 10 times per day Flora Lipps, MD   15 mL at 08/24/20 1230  . metoprolol tartrate (LOPRESSOR) injection 5 mg  5 mg Intravenous Q6H PRN  Rust-Chester, Britton L, NP   5 mg at 08/18/20 1528  . metoprolol tartrate (LOPRESSOR) tablet 12.5 mg  12.5 mg Per NG tube BID Ottie Glazier, MD   12.5 mg at 08/22/20 2234  . midazolam (VERSED) 50 mg/50 mL (1 mg/mL) premix infusion  0-4 mg/hr Intravenous Continuous Freddi Starr, MD 2 mL/hr at 08/24/20 0746 2 mg/hr at 08/24/20 0746  . midazolam (VERSED) bolus via infusion 1-2 mg  1-2 mg Intravenous Q2H PRN Bradly Bienenstock, NP   2 mg at 08/22/20 1508  . multivitamin (RENA-VIT) tablet 1 tablet  1 tablet Per Tube QHS Rosine Door, MD   1 tablet at 08/23/20 2136  . polyethylene glycol (MIRALAX / GLYCOLAX) packet 17 g  17 g Per Tube Daily Freddi Starr, MD   17 g at 08/24/20 1000  . sodium chloride flush (NS) 0.9 % injection 10-40 mL  10-40 mL Intracatheter Q12H Freddi Starr, MD   10 mL at 08/24/20 1000  . sodium chloride flush (NS) 0.9 % injection 10-40 mL  10-40 mL Intracatheter PRN Freda Jackson B, MD      . sodium chloride flush (NS) 0.9 % injection 3 mL  3 mL Intravenous Q12H Flora Lipps, MD   3 mL at 08/23/20 2136  . sodium chloride flush (NS) 0.9 % injection 3 mL  3 mL Intravenous PRN Flora Lipps, MD      . sodium zirconium cyclosilicate (LOKELMA) packet 10 g  10 g Per Tube TID Freddi Starr, MD   10 g at 08/24/20 1000   Past Medical History:  Diagnosis Date  . CAD (coronary artery disease)   . Cataract   . CHICKENPOX, HX OF 08/30/2008   Qualifier: Diagnosis of  By: [name], [name]    . Chronic kidney disease   . Diabetes mellitus   . Glaucoma   . History of urinary calculi 08/30/2008   Qualifier: Diagnosis of  By: [name], Jacelyn Pi   . Hyperlipidemia   . Hypertension   . Renal calculus or stone   . Skin cancer of nose    ears and hands   Past Surgical History:  Procedure Laterality Date  . ANKLE SURGERY     2013/ wears boot cast  . BASAL CELL CARCINOMA EXCISION     hands  . LAMINECTOMY  1983/1988  . Left knee replacement  2002/ 2004  .  LITHOTRIPSY     was cancelled/pt passed stone  . MOHS SURGERY     nose  . TONSILLECTOMY     Social History Social History   Tobacco Use  . Smoking status: Never Smoker  . Smokeless tobacco: Former Network engineer Use Topics  . Alcohol use: No  . Drug use: No   Family History Family History  Problem Relation Age of Onset  . Heart  disease Brother        Atrial fibrillation  . Heart disease Father        CHF  . Heart disease Mother   . Heart disease Brother        atrial fib  . Cancer Sister        bone  Patient is critically ill on the vent cannot obtain at this time.  Allergies  Allergen Reactions  . Demerol Other (See Comments)    Blood pressure dropped completely out  . Meperidine Other (See Comments)    Drops blood pressure out  Blood pressure dropped completely out  . Meperidine Hcl Other (See Comments)    Drops blood pressure out  . Nitrospan [Nitroglycerin] Other (See Comments)    Blood pressure dropped out  . Sulfa Antibiotics Other (See Comments)    Urinary burning; blisters   . Sulfasalazine Other (See Comments)    Urinary burning; blisters    REVIEW OF SYSTEMS (Negative unless checked)  Constitutional: [] Weight loss  [] Fever  [] Chills Cardiac: [] Chest pain   [] Chest pressure   [] Palpitations   [] Shortness of breath when laying flat   [] Shortness of breath at rest   [] Shortness of breath with exertion. Vascular:  [] Pain in legs with walking   [] Pain in legs at rest   [] Pain in legs when laying flat   [] Claudication   [] Pain in feet when walking  [] Pain in feet at rest  [] Pain in feet when laying flat   [] History of DVT   [] Phlebitis   [] Swelling in legs   [] Varicose veins   [] Non-healing ulcers Pulmonary:   [] Uses home oxygen   [] Productive cough   [] Hemoptysis   [] Wheeze  [] COPD   [] Asthma Neurologic:  [] Dizziness  [] Blackouts   [] Seizures   [] History of stroke   [] History of TIA  [] Aphasia   [] Temporary blindness   [] Dysphagia   [] Weakness or numbness in  arms   [] Weakness or numbness in legs Musculoskeletal:  [] Arthritis   [] Joint swelling   [] Joint pain   [] Low back pain Hematologic:  [] Easy bruising  [] Easy bleeding   [] Hypercoagulable state   [] Anemic  [] Hepatitis Gastrointestinal:  [] Blood in stool   [] Vomiting blood  [] Gastroesophageal reflux/heartburn   [] Difficulty swallowing. Genitourinary:  [] Chronic kidney disease   [] Difficult urination  [] Frequent urination  [] Burning with urination   [] Blood in urine Skin:  [] Rashes   [] Ulcers   [] Wounds Psychological:  [] History of anxiety   []  History of major depression.  Patient is critically ill unable to obtain at this time  Physical Examination  Vitals:   08/24/20 0423 08/24/20 0500 08/24/20 0600 08/24/20 0700  BP:  (!) 134/55 (!) 136/54 (!) 134/57  Pulse:  88 87   Resp:  20 (!) 21 (!) 22  Temp:  (!) 100.58 F (38.1 C) (!) 100.94 F (38.3 C) (!) 101.3 F (38.5 C)  TempSrc:      SpO2: 92% 92% 91%   Weight:  110.4 kg    Height: 6' 3.98" (1.93 m)      Body mass index is 29.64 kg/m. Gen: Critically ill, NAD Head: [place]/AT, No temporalis wasting. Prominent temp pulse not noted. Ear/Nose/Throat: nares w/o erythema or drainage, oropharynx w/o Erythema/Exudate Eyes: Sclera non-icteric, conjunctiva clear Neck: Trachea midline.  No JVD.  Pulmonary:  On vent, respirations not labored, equal bilaterally.  Cardiac: RRR, normal S1, S2. Vascular:  Vessel Right Left  Radial Palpable Palpable  Ulnar Palpable Palpable  Brachial Palpable Palpable  Carotid Palpable, without bruit Palpable, without bruit  Aorta Not palpable N/A  Femoral Palpable Palpable  Popliteal Palpable Palpable  PT Non-Palpable Non-Palpable  DP Non-Palpable Non-Palpable   Right lower extremity: Thigh soft.  Calf soft.  Extremities warm however transitions approximately mid calf then becomes cooler.  Unable to palpate pedal pulses.  Moderate edema.  Mottled foot.  Capillary refill.  Left lower extremity: Thigh soft.   Calf soft.  Extremities warm however transitions approximately mid calf then becomes cooler.  Unable to palpate pedal pulses.  Moderate edema.  Mottled foot.  Capillary refill.  Gastrointestinal: soft, non-tender/non-distended. No guarding/reflex.  Musculoskeletal: M/S 5/5 throughout.  Extremities without ischemic changes.  No deformity or atrophy. No edema. Neurologic: Critically ill, sedated and intubated.  Unable to assess.   Psychiatric: Critically ill, sedated and intubated.  Unable to assess.   Dermatologic: No rashes or ulcers noted.  No cellulitis or open wounds. Lymph : No Cervical, Axillary, or Inguinal lymphadenopathy.  CBC Lab Results  Component Value Date   WBC 22.3 (H) 08/24/2020   HGB 8.2 (L) 08/24/2020   HCT 20.7 (L) 08/24/2020   MCV 75.5 (L) 08/24/2020   PLT 153 08/24/2020   BMET    Component Value Date/Time   NA 132 (L) 08/24/2020 0530   NA 139 09/03/2016 1035   K 5.1 08/24/2020 0530   CL 94 (L) 08/24/2020 0530   CO2 20 (L) 08/24/2020 0530   GLUCOSE 138 (H) 08/24/2020 0530   BUN 137 (H) 08/24/2020 0530   BUN 17 09/03/2016 1035   CREATININE 5.23 (H) 08/24/2020 0530   CREATININE 0.89 01/11/2015 0833   CALCIUM 7.3 (L) 08/24/2020 0530   GFRNONAA 11 (L) 08/24/2020 0530   GFRNONAA >89 01/11/2015 0833   GFRAA 92 09/03/2016 1035   GFRAA >89 01/11/2015 0833   Estimated Creatinine Clearance: 17.9 mL/min (A) (by C-G formula based on SCr of 5.23 mg/dL (H)).  COAG Lab Results  Component Value Date   INR 1.3 (H) 08/27/2020   Radiology EEG  Result Date: 08/24/2020 Lora Havens, MD     08/24/2020  2:11 PM Patient Name: DAYLON LAFAVOR MRN: [id number] Epilepsy Attending: Lora Havens Referring Physician/Provider: Dr. Freda Jackson Date: 08/24/2020 Duration: 21.04 mins Patient history: 70 year old male with altered mental status.  EEG to evaluate for seizures. Level of alertness: Comatose AEDs during EEG study: Versed Technical aspects: This EEG study was done  with scalp electrodes positioned according to the 10-20 International system of electrode placement. Electrical activity was acquired at a sampling rate of 500Hz  and reviewed with a high frequency filter of 70Hz  and a low frequency filter of 1Hz . EEG data were recorded continuously and digitally stored. Description: EEG showed continuous generalized 3 to 5 Hz theta-delta slowing.  Of note, this was a technically difficult study due to significant sweat artifact. Hyperventilation and photic stimulation were not performed.   ABNORMALITY -Continuous slow, generalized IMPRESSION: This technically difficult study is suggestive of severe diffuse encephalopathy, nonspecific etiology.  No seizures or epileptiform discharges were seen throughout the recording. If concern for ictal-interictal activity persists, please consider repeat study. Lora Havens   DG Abd 1 View  Result Date: 08/21/2020 CLINICAL DATA:  Orogastric tube placement EXAM: ABDOMEN - 1 VIEW COMPARISON:  Portable exam 1437 hours compared to 08/18/2020 FINDINGS: Tip of orogastric tube projects over stomach. RIGHT basilar opacity question pneumonia versus atelectasis. Nonobstructive bowel gas pattern. Osseous structures unremarkable. IMPRESSION: Orogastric tube projects over proximal stomach RIGHT basilar opacity  question atelectasis versus pneumonia. Electronically Signed   By: Lavonia Dana M.D.   On: 08/21/2020 14:46   DG Abd 1 View  Result Date: 08/18/2020 CLINICAL DATA:  Gastroenteritis. EXAM: ABDOMEN - 1 VIEW COMPARISON:  08/26/2020 FINDINGS: Nasogastric tube is present with tip over the stomach in the left upper quadrant and side-port in the region of the gastroesophageal junction. This could be advanced another 6-8 cm. Bowel gas pattern is nonobstructive. There are a few small calcifications over the kidneys bilaterally likely nephrolithiasis. Catheter tip is seen from below over the right pelvis with tip adjacent the level of the right  inferior sacroiliac joint likely femoral venous catheter. IMPRESSION: 1. Nonobstructive bowel gas pattern. 2. Bilateral nephrolithiasis. 3. Nasogastric tube with tip over the stomach in the left upper quadrant and side-port in the region of the gastroesophageal junction. Electronically Signed   By: Marin Olp M.D.   On: 08/18/2020 09:53   DG Abdomen 1 View  Result Date: 08/23/2020 CLINICAL DATA:  Nasogastric tube placement. EXAM: ABDOMEN - 1 VIEW COMPARISON:  Abdominal radiographs 10/09/2016. Abdominal CT 06/15/2018. FINDINGS: 1353 hours. Enteric tube projects below the diaphragm, with tip overlying the gastric fundal region. Side hole is near the gastroesophageal junction. The visualized bowel gas pattern is normal. There are degenerative changes within the thoracolumbar spine. IMPRESSION: Enteric tube projects to the level of the proximal stomach. Electronically Signed   By: Richardean Sale M.D.   On: 08/17/2020 14:07   CT HEAD WO CONTRAST  Result Date: 08/23/2020 CLINICAL DATA:  Mental status change.  COVID positive EXAM: CT HEAD WITHOUT CONTRAST TECHNIQUE: Contiguous axial images were obtained from the base of the skull through the vertex without intravenous contrast. COMPARISON:  CT head 08/17/2020 FINDINGS: Brain: No evidence of acute infarction, hemorrhage, hydrocephalus, extra-axial collection or mass lesion/mass effect. Vascular: Negative for hyperdense vessel Skull: Negative Sinuses/Orbits: Paranasal sinuses clear. Negative orbit. Left cataract extraction. Other: None IMPRESSION: Negative CT head Electronically Signed   By: Franchot Gallo M.D.   On: 08/23/2020 15:32   CT Head Wo Contrast  Result Date: 08/04/2020 CLINICAL DATA:  Change in mental status EXAM: CT HEAD WITHOUT CONTRAST TECHNIQUE: Contiguous axial images were obtained from the base of the skull through the vertex without intravenous contrast. COMPARISON:  None. FINDINGS: Brain: No evidence of acute infarction, hemorrhage,  hydrocephalus, extra-axial collection or mass lesion/mass effect. Vascular: No hyperdense vessel or unexpected calcification. Skull: Normal. Negative for fracture or focal lesion. Sinuses/Orbits: No acute finding.  Left cataract resection. Other: Pervasive motion artifact to a moderate degree. IMPRESSION: Negative motion degraded head CT. Electronically Signed   By: Monte Fantasia M.D.   On: 08/05/2020 11:11   US RENAL  Result Date: 08/14/2020 CLINICAL DATA:  Acute renal failure EXAM: RENAL / URINARY TRACT ULTRASOUND COMPLETE COMPARISON:  Ultrasound kidneys 04/13/2019 MRI abdomen 07/06/2020 FINDINGS: Right Kidney: Renal measurements: 11.0 x 6.9 x 6.1 = volume: 243 mL. 2.3 cm hypoechoic structure in the anterior right kidney likely a simple cyst given appearance on recent MRI. Echogenicity within normal limits. No suspicious mass or hydronephrosis visualized. Left Kidney: Renal measurements: 11.5 x 6.7 x 1.5 = volume: 302 mL. Echogenicity within normal limits. No mass or hydronephrosis visualized. 12 mm linear echogenicity in the lower pole suspicious for nonobstructing calculus. Bladder: Unable to evaluate as it is collapsed around a Foley catheter balloon. Other: None. IMPRESSION: 1. No hydronephrosis. 2. Nonobstructing left renal calculus. Electronically Signed   By: Miachel Roux M.D.   On:  08/14/2020 08:16   US Venous Img Lower Bilateral (DVT)  Result Date: 08/17/2020 CLINICAL DATA:  Lower extremity edema EXAM: BILATERAL LOWER EXTREMITY VENOUS DOPPLER ULTRASOUND BILATERAL LOWER EXTREMITY VENOUS DOPPLER ULTRASOUND TECHNIQUE: Gray-scale sonography with graded compression, as well as color Doppler and duplex ultrasound were performed to evaluate the lower extremity deep venous systems from the level of the common femoral vein and including the common femoral, femoral, profunda femoral, popliteal and calf veins including the posterior tibial, peroneal and gastrocnemius veins when visible. The superficial  great saphenous vein was also interrogated. Spectral Doppler was utilized to evaluate flow at rest and with distal augmentation maneuvers in the common femoral, femoral and popliteal veins. COMPARISON:  None. FINDINGS: RIGHT LOWER EXTREMITY Common Femoral Vein: No evidence of thrombus. Normal compressibility, respiratory phasicity and response to augmentation. Saphenofemoral Junction: No evidence of thrombus. Normal compressibility and flow on color Doppler imaging. Profunda Femoral Vein: No evidence of thrombus. Normal compressibility and flow on color Doppler imaging. Femoral Vein: No evidence of thrombus. Normal compressibility, respiratory phasicity and response to augmentation. Popliteal Vein: No evidence of thrombus. Normal compressibility, respiratory phasicity and response to augmentation. Calf Veins: No evidence of thrombus. Normal compressibility and flow on color Doppler imaging. Limited visualization of peroneal veins. Superficial Great Saphenous Vein: No evidence of thrombus. Normal compressibility. Other Findings:  None. LEFT LOWER EXTREMITY Common Femoral Vein: No evidence of thrombus. Normal compressibility, respiratory phasicity and response to augmentation. Saphenofemoral Junction: No evidence of thrombus. Normal compressibility and flow on color Doppler imaging. Profunda Femoral Vein: No evidence of thrombus. Normal compressibility and flow on color Doppler imaging. Femoral Vein: No evidence of thrombus. Normal compressibility, respiratory phasicity and response to augmentation. Popliteal Vein: No evidence of thrombus. Normal compressibility, respiratory phasicity and response to augmentation. Calf Veins: No evidence of thrombus. Normal compressibility and flow on color Doppler imaging.Limited visualization of peroneal veins. Superficial Great Saphenous Vein: No evidence of thrombus. Normal compressibility. Other Findings:  None. IMPRESSION: No evidence of deep venous thrombosis in either lower  extremity. Electronically Signed   By: Miachel Roux M.D.   On: 08/17/2020 10:08   DG Chest Port 1 View  Result Date: 08/22/2020 CLINICAL DATA:  Respiratory failure. EXAM: PORTABLE CHEST 1 VIEW COMPARISON:  08/20/2020 FINDINGS: Hemidiaphragms not completely imaged. Endotracheal tube, NG tube, left IJ line stable position. Heart size normal. Diffuse bilateral pulmonary infiltrates again noted. No significant interim change. No prominent pleural effusion. No pneumothorax. IMPRESSION: 1. Diffuse bilateral pulmonary infiltrates again noted. No significant interim change. 2. Lines and tubes in stable position. Electronically Signed   By: Marcello Moores  Register   On: 08/22/2020 06:18   DG Chest Port 1 View  Result Date: 08/20/2020 CLINICAL DATA:  Shortness of breath. EXAM: PORTABLE CHEST 1 VIEW COMPARISON:  August 19, 2020 FINDINGS: The support devices appear relatively stable from prior study. Bilateral hazy airspace opacities are again noted. There is no definite pneumothorax. The heart size remains stable. There is a dense retrocardiac airspace opacity. There are probable bilateral pleural effusions. IMPRESSION: 1. Stable support devices. 2. Persistent bilateral hazy airspace opacities and probable bilateral pleural effusions. Electronically Signed   By: Constance Holster M.D.   On: 08/20/2020 04:33   DG Chest Port 1 View  Result Date: 08/19/2020 CLINICAL DATA:  70 year old male COVID-19.  Intubated. EXAM: PORTABLE CHEST 1 VIEW COMPARISON:  Portable chest 08/18/2020 and earlier. FINDINGS: Portable AP upright view at 0537 hours. Stable lines and tubes. Left IJ dual lumen catheter again noted. Increased  veiling opacity in the right lower lung and increased dense retrocardiac opacity obscuring some of the left hemidiaphragm. Elsewhere coarse and indistinct pulmonary opacity is stable. No pneumothorax identified. Mediastinal contours remain normal. Paucity of bowel gas in the upper abdomen. IMPRESSION: 1. Evidence  of small to moderate new right pleural effusion, increasing bilateral lower lobe collapse or consolidation. Underlying bilateral COVID-19 pneumonia suspected. 2. Stable lines and tubes. Electronically Signed   By: Genevie Ann M.D.   On: 08/19/2020 07:43   DG Chest Port 1 View  Result Date: 08/18/2020 CLINICAL DATA:  Endotracheal tube EXAM: PORTABLE CHEST 1 VIEW COMPARISON:  August 16, 2020 FINDINGS: The endotracheal tube terminates above the carina. The dialysis catheter is stable in positioning. The enteric tube extends below the left hemidiaphragm. Hazy airspace opacities are noted bilaterally which have worsened since the prior study, especially in the left lower lobe. There is no pneumothorax. There may be small bilateral pleural effusions. IMPRESSION: 1. Lines and tubes as above. 2. Worsening bilateral hazy airspace opacities, especially in the left lower lobe. Electronically Signed   By: Constance Holster M.D.   On: 08/18/2020 06:03   DG Chest Port 1 View  Result Date: 08/16/2020 CLINICAL DATA:  Central line placement EXAM: PORTABLE CHEST 1 VIEW COMPARISON:  08/15/2020 FINDINGS: Interval placement of left neck vascular catheter, tip positioned over the superior SVC. Otherwise unchanged AP portable chest radiograph, support apparatus including endotracheal tube and esophagogastric tube, with patchy bilateral heterogeneous airspace opacity. The heart and mediastinum are unremarkable. IMPRESSION: 1. Interval placement of left neck vascular catheter, tip positioned over the superior SVC. 2. Otherwise unchanged AP portable chest radiograph, support apparatus including endotracheal tube and esophagogastric tube, with patchy bilateral heterogeneous airspace opacity. Electronically Signed   By: Eddie Candle M.D.   On: 08/16/2020 10:10   DG Chest Port 1 View  Result Date: 08/15/2020 CLINICAL DATA:  Acute respiratory failure with hypoxia EXAM: PORTABLE CHEST 1 VIEW COMPARISON:  08/14/2020 FINDINGS:  Endotracheal tube and NG tube are unchanged. Bibasilar atelectasis or infiltrates. No effusions. Heart is normal size. No acute bony abnormality. IMPRESSION: Bibasilar atelectasis or infiltrates. Electronically Signed   By: Rolm Baptise M.D.   On: 08/15/2020 03:01   DG Chest Port 1 View  Result Date: 08/14/2020 CLINICAL DATA:  Endotracheal tube placement EXAM: PORTABLE CHEST 1 VIEW COMPARISON:  August 13, 2020 FINDINGS: The endotracheal tube terminates approximately 6 cm above the carina. The enteric tube extends below the left hemidiaphragm. There is no definite pneumothorax or large pleural effusion. There are increasing airspace opacities at the left lung base and in the retrocardiac region. The heart size is stable. Aortic calcifications are noted. IMPRESSION: 1. Lines and tubes as above. 2. Increasing airspace opacities at the left lung base and within the retrocardiac region suspicious for atelectasis or developing pneumonia. Electronically Signed   By: Constance Holster M.D.   On: 08/14/2020 00:24   DG Chest Port 1 View  Result Date: 08/31/2020 CLINICAL DATA:  Attempted central line placement. Assess for pneumothorax EXAM: PORTABLE CHEST 1 VIEW COMPARISON:  08/11/2020 FINDINGS: Endotracheal tube is 7.6 cm above the carina. NG tube is in the stomach. No visible pneumothorax. Heart is normal size. Bibasilar airspace opacities. No effusions. IMPRESSION: No visible pneumothorax. Bibasilar atelectasis or infiltrates. Electronically Signed   By: Rolm Baptise M.D.   On: 08/29/2020 21:51   DG Chest Portable 1 View  Result Date: 08/10/2020 CLINICAL DATA:  Hypoxia.  Reported nasogastric tube placement EXAM: PORTABLE  CHEST 1 VIEW COMPARISON:  August 13, 2020 study obtained earlier in the day FINDINGS: A nasogastric tube is not appreciable on current examination. Endotracheal tube tip is 6.8 cm above the carina. No pneumothorax. Lungs are clear. Heart size and pulmonary vascularity are normal. No  adenopathy. There is degenerative change in the thoracic spine. IMPRESSION: Endotracheal tube as described without pneumothorax. No nasogastric tube apparent. Lungs clear. Cardiac silhouette normal. Electronically Signed   By: Lowella Grip III M.D.   On: 08/10/2020 12:59   DG Chest Portable 1 View  Result Date: 08/21/2020 CLINICAL DATA:  Chest pain shortness of breath. Recent COVID-19 diagnosis. EXAM: PORTABLE CHEST 1 VIEW COMPARISON:  03/03/2011 FINDINGS: Midline trachea. Borderline cardiomegaly. No pleural effusion or pneumothorax. Suspect mild left base scarring. Clear right lung. IMPRESSION: Borderline cardiomegaly, without acute disease. Electronically Signed   By: Abigail Miyamoto M.D.   On: 08/22/2020 10:24   [place] EKG SITE RITE  Result Date: 08/23/2020 If Site Rite image not attached, placement could not be confirmed due to current cardiac rhythm.  Assessment/Plan The patient is a 70 year old male with past medical history of CAD, CKD, DM, glaucoma, cataract, urinary calculi, HLD< HTN, skin cancer admitted on 08/04/2020 with AMS and unresponsiveness. Diagnosed with COVID-19 first week of January 2022. ICU admission for severe DKA and gastroenteritis. Required intubation/mechanical ventilation on 1/11 for severe respiratory failure.  1. Poor Lower Extremity Arterial Perfusion: On physical examination the patient is noted to have mottled feet bilaterally, unable to palpate pedal pulses, poor capillary refill.  Extremity is warm distally however does transition approximately mid calf then becomes cooler.  In the setting of critical illness requiring mechanical ventilator support, sepsis, shock requiring pressor support which has been weaned, encephalopathy in setting of multiorgan failure causing vasoconstriction and subsequent mottling of the feet.  Endovascular intervention at this time would NOT provide any benefit or improvement in arterial patency until the patient's overall condition improves.   If the patient does not improve we can reassess for possible angiogram.  This was all explained to the patient's wife was at the bedside and his daughter who was on the phone.  Both expressed their understanding and are in agreement.  2. COVID-19: Still on vent however have been able to wean If unable to wean the patient will need a tracheostomy in the future Managed by the intensivist team  3. Acute Renal Failure: Remains oliguric Currently on dialysis Nephrology following  Discussed with Dr. Francene Castle, PA-C  08/24/2020 2:17 PM  This note was created with Dragon medical transcription system.  Any error is purely unintentional

## 2020-08-24 NOTE — Progress Notes (Signed)
[address] [place], [place] [address] Phone [phone]. Fax [phone]  Date: 08/24/2020                  Patient Name:  Nathaniel Thomas  MRN: [id number]  DOB: 1951-07-08  Age / Sex: 70 y.o., male         PCP: Dion Body, MD                  Presenting Illness: Patient is a 70 y.o. male  admitted to [facility] on 08/18/2020 for evaluation of Metabolic acidosis [D78.2] Encounter for central line placement [Z45.2] Acute renal failure (ARF) ([place]) [N17.9] Acute respiratory failure with hypoxia ([place]) [J96.01] Sepsis, due to unspecified organism, unspecified whether acute organ dysfunction present ([place]) [A41.9] COVID-19 [U07.1]   Patient presented to the ER via EMS from home for altered mental status.  Recently treated for COVID.  Per triage notes, patient was prescribed antibiotics for UTI but is unable to take due to lethargy.  Upon arrival patient was responsive to painful stimuli. Also reported to have nausea vomiting and diarrhea for several days prior to admission.  Initial evaluation showed severe acidosis with elevated blood sugars.  Also noted to have hypothermia.  He was intubated for severe acute respiratory failure.  Initially on presentation, pH of 6.99. Patient was treated with ivermectin as outpatient for COVID.  He is unvaccinated.  Hospital course:  1/14- now on amiodarone, vasopressin. Ketamine, fentanyl, phenylephrine. UOP remains poor.  1/15-  Remains critically ill.  Still on the vent. Due for another dialysis session today. Poor urine output.  1/16-patient underwent hemodialysis yesterday.  Remains critically ill.  Still on the ventilator.  Remains oliguric.  1/17- patient continues to have significant renal dysfunction.  Urine output only 225 cc over the preceding 24 hours.  Still on the ventilator. 1/18- critical illness persist.  Patient still requiring vent support.  Patient due for dialysis treatment today.  Urine output remains diminished at 175  cc over the preceding 24 hours. 1/19- patient remains critically ill.  Still requiring significant vent support with FiO2 of 60%.  Completed dialysis yesterday.  Remains oliguric. 1/20- patient requiring ventilatory support.  FiO2 40%.  Due for hemodialysis treatment today.  Patient continues to have low urine output. 1/21- patient continues to have oliguric urine output of only 125 cc over the preceding 24 hours. BUN high at 137 with a creatinine of 5.23. Potassium improved to 5.1. Dialysis to be performed today. Still requiring ventilatory support.  01/20 0701 - 01/21 0700 In: 33 [I.V.:33] Out: -1305 [Urine:135; Emesis/NG output:60]    Vital Signs: Blood pressure (!) 134/57, pulse 87, temperature (!) 101.3 F (38.5 C), resp. rate (!) 22, height 6' 3.98" (1.93 m), weight 110.4 kg, SpO2 91 %.   Intake/Output Summary (Last 24 hours) at 08/24/2020 1307 Last data filed at 08/24/2020 0748 Gross per 24 hour  Intake --  Output -1365 ml  Net 1365 ml    Weight trends: Filed Weights   08/22/20 0500 08/23/20 0500 08/24/20 0500  Weight: 108.5 kg 112.3 kg 110.4 kg    Physical Exam: General:  Critically ill-appearing  HEENT  ET tube in place  Lungs:  Ventilator assisted, FiO2 40%  Heart::  Regular  Abdomen:  Nondistended  Extremities:  1+ edema  Neurologic:  Sedated with Precedex and Versed  Skin:  Warm, dry  Access:  Left IJ temp Cath 1/13- ICU team  Foley:  In place  Lab results: Basic Metabolic Panel: Recent Labs  Lab 08/22/20 0352 08/22/20 1337 08/22/20 2030 08/23/20 0047 08/23/20 0540 08/24/20 0530  NA 136   < > 134* 132* 133* 132*  K 6.1*   < > 6.3* 6.1* 6.2* 5.1  CL 95*   < > 93* 94* 94* 94*  CO2 22   < > 21* 19* 20* 20*  GLUCOSE 112*   < > 266* 252* 229* 138*  BUN 144*   < > 167* 168* 183* 137*  CREATININE 5.57*   < > 6.32* 6.15* 6.33* 5.23*  CALCIUM 7.0*   < > 7.1* 7.3* 7.5* 7.3*  MG 2.0  --   --   --  2.4 2.1  PHOS 8.3*   < > 9.6*  --  10.1* 7.8*   < > =  values in this interval not displayed.    Liver Function Tests: Recent Labs  Lab 08/22/20 0352 08/22/20 1337 08/24/20 0530  AST 158*  --   --   ALT 166*  --   --   ALKPHOS 316*  --   --   BILITOT 1.1  --   --   PROT 5.3*  --   --   ALBUMIN 1.5*   1.5*   < > 1.4*   < > = values in this interval not displayed.   No results for input(s): LIPASE, AMYLASE in the last 168 hours. No results for input(s): AMMONIA in the last 168 hours.  CBC: Recent Labs  Lab 08/23/20 1154 08/24/20 0530  WBC 25.3* 22.3*  HGB 9.2* 8.2*  HCT RESULTS UNAVAILABLE DUE TO INTERFERING SUBSTANCE 20.7*  MCV RESULTS UNAVAILABLE DUE TO INTERFERING SUBSTANCE 75.5*  PLT 144* 153    Cardiac Enzymes: No results for input(s): CKTOTAL, TROPONINI in the last 168 hours.  BNP: Invalid input(s): POCBNP  CBG: Recent Labs  Lab 08/23/20 1640 08/23/20 1934 08/23/20 2337 08/24/20 0429 08/24/20 0723  GLUCAP 154* 150* 149* 116* 124*    Microbiology: Recent Results (from the past 720 hour(s))  Blood Culture (routine x 2)     Status: None   Collection Time: 08/16/2020  9:50 AM   Specimen: BLOOD  Result Value Ref Range Status   Specimen Description BLOOD LEFT [facility]  Final   Special Requests   Final    BOTTLES DRAWN AEROBIC AND ANAEROBIC Blood Culture adequate volume   Culture   Final    NO GROWTH 5 DAYS Performed at [facility], [address] [place], [place] [address]    Report Status 08/18/2020 FINAL  Final  Blood Culture (routine x 2)     Status: None   Collection Time: 08/15/2020  9:50 AM   Specimen: BLOOD  Result Value Ref Range Status   Specimen Description BLOOD RIGHT AC  Final   Special Requests   Final    BOTTLES DRAWN AEROBIC AND ANAEROBIC Blood Culture results may not be optimal due to an excessive volume of blood received in culture bottles   Culture   Final    NO GROWTH 5 DAYS Performed at [facility], [address], [place], [place] [address]    Report Status  08/18/2020 FINAL  Final  Urine culture     Status: None   Collection Time: 08/16/2020 11:36 AM   Specimen: Urine, Random  Result Value Ref Range Status   Specimen Description   Final    URINE, RANDOM Performed at [facility], [address], [place],  [address]  Special Requests   Final    NONE Performed at [facility], [address], [place], [place] [address]    Culture   Final    NO GROWTH Performed at [facility], [place] [address] [place], [place] [address]    Report Status 08/14/2020 FINAL  Final  Culture, respiratory     Status: None   Collection Time: 08/17/20  3:15 PM   Specimen: Tracheal Aspirate; Respiratory  Result Value Ref Range Status   Specimen Description   Final    TRACHEAL ASPIRATE Performed at [facility], [address], [place], [place] [address]    Special Requests   Final    NONE Performed at [facility], [place], [place] [address]    Gram Stain NO WBC SEEN MODERATE GRAM VARIABLE ROD   Final   Culture   Final    ABUNDANT SERRATIA MARCESCENS SUSCEPTIBILITIES PERFORMED ON PREVIOUS CULTURE WITHIN THE LAST 5 DAYS. Performed at [facility], [place] [address], [place], [place] [address]    Report Status 08/20/2020 FINAL  Final  Culture, respiratory     Status: None   Collection Time: 08/18/20 11:09 AM   Specimen: Tracheal Aspirate; Respiratory  Result Value Ref Range Status   Specimen Description   Final    TRACHEAL ASPIRATE Performed at [facility], [address] [place], [place] [address]    Special Requests   Final    NONE Performed at [facility], [place], [place] [address]    Gram Stain   Final    MODERATE WBC PRESENT, PREDOMINANTLY PMN MODERATE GRAM NEGATIVE RODS FEW GRAM VARIABLE ROD RARE GRAM POSITIVE COCCI Performed at [facility], [place] [address] [place], [place] [address]    Culture ABUNDANT SERRATIA  MARCESCENS  Final   Report Status 08/20/2020 FINAL  Final   Organism ID, Bacteria SERRATIA MARCESCENS  Final      Susceptibility   Serratia marcescens - MIC*    CEFAZOLIN >=64 RESISTANT Resistant     CEFEPIME <=0.12 SENSITIVE Sensitive     CEFTAZIDIME <=1 SENSITIVE Sensitive     CEFTRIAXONE <=0.25 SENSITIVE Sensitive     CIPROFLOXACIN <=0.25 SENSITIVE Sensitive     GENTAMICIN <=1 SENSITIVE Sensitive     TRIMETH/SULFA <=20 SENSITIVE Sensitive     * ABUNDANT SERRATIA MARCESCENS  MRSA PCR Screening     Status: None   Collection Time: 08/18/20 12:52 PM   Specimen: Nasopharyngeal  Result Value Ref Range Status   MRSA by PCR NEGATIVE NEGATIVE Final    Comment:        The GeneXpert MRSA Assay (FDA approved for NASAL specimens only), is one component of a comprehensive MRSA colonization surveillance program. It is not intended to diagnose MRSA infection nor to guide or monitor treatment for MRSA infections. Performed at [facility], [address] [place], [place] [address]   Culture, respiratory (non-expectorated)     Status: None   Collection Time: 08/21/20  4:52 PM   Specimen: Tracheal Aspirate; Respiratory  Result Value Ref Range Status   Specimen Description   Final    TRACHEAL ASPIRATE Performed at [facility], [address], [place],  [address]    Special Requests   Final    NONE Performed at [facility], [address] [place],  [address]    Gram Stain   Final    FEW WBC PRESENT, PREDOMINANTLY PMN  ABUNDANT GRAM VARIABLE ROD MODERATE GRAM POSITIVE COCCI Performed at [facility], [place] [address], [place], [place] [address]    Culture ABUNDANT SERRATIA MARCESCENS  Final   Report Status 08/24/2020 FINAL  Final   Organism ID, Bacteria SERRATIA MARCESCENS  Final      Susceptibility   Serratia marcescens - MIC*    CEFAZOLIN >=64 RESISTANT Resistant     CEFEPIME <=0.12 SENSITIVE Sensitive     CEFTAZIDIME <=1  SENSITIVE Sensitive     CEFTRIAXONE <=0.25 SENSITIVE Sensitive     CIPROFLOXACIN <=0.25 SENSITIVE Sensitive     GENTAMICIN <=1 SENSITIVE Sensitive     TRIMETH/SULFA <=20 SENSITIVE Sensitive     * ABUNDANT SERRATIA MARCESCENS  CULTURE, BLOOD (ROUTINE X 2) w Reflex to ID Panel     Status: None (Preliminary result)   Collection Time: 08/22/20  1:37 PM   Specimen: BLOOD  Result Value Ref Range Status   Specimen Description BLOOD BLOOD LEFT HAND  Final   Special Requests   Final    BOTTLES DRAWN AEROBIC AND ANAEROBIC Blood Culture adequate volume   Culture   Final    NO GROWTH 2 DAYS Performed at [facility], [address], [place], [place] [address]    Report Status PENDING  Incomplete  CULTURE, BLOOD (ROUTINE X 2) w Reflex to ID Panel     Status: None (Preliminary result)   Collection Time: 08/22/20  1:37 PM   Specimen: BLOOD  Result Value Ref Range Status   Specimen Description BLOOD LEFT ANTECUBITAL  Final   Special Requests   Final    BOTTLES DRAWN AEROBIC AND ANAEROBIC Blood Culture adequate volume   Culture   Final    NO GROWTH 2 DAYS Performed at [facility], [address] [place], [place] [address]    Report Status PENDING  Incomplete     Coagulation Studies: No results for input(s): LABPROT, INR in the last 72 hours.  Urinalysis: No results for input(s): COLORURINE, LABSPEC, PHURINE, GLUCOSEU, HGBUR, BILIRUBINUR, KETONESUR, PROTEINUR, UROBILINOGEN, NITRITE, LEUKOCYTESUR in the last 72 hours.  Invalid input(s): APPERANCEUR      Imaging: CT HEAD WO CONTRAST  Result Date: 08/23/2020 CLINICAL DATA:  Mental status change.  COVID positive EXAM: CT HEAD WITHOUT CONTRAST TECHNIQUE: Contiguous axial images were obtained from the base of the skull through the vertex without intravenous contrast. COMPARISON:  CT head 08/24/2020 FINDINGS: Brain: No evidence of acute infarction, hemorrhage, hydrocephalus, extra-axial collection or mass lesion/mass  effect. Vascular: Negative for hyperdense vessel Skull: Negative Sinuses/Orbits: Paranasal sinuses clear. Negative orbit. Left cataract extraction. Other: None IMPRESSION: Negative CT head Electronically Signed   By: Franchot Gallo M.D.   On: 08/23/2020 15:32   [place] EKG SITE RITE  Result Date: 08/23/2020 If Site Rite image not attached, placement could not be confirmed due to current cardiac rhythm.  Scheduled Meds:  amiodarone  200 mg Per Tube BID   chlorhexidine gluconate (MEDLINE KIT)  15 mL Mouth Rinse BID   Chlorhexidine Gluconate Cloth  6 each Topical Daily   docusate  100 mg Per Tube BID   feeding supplement (PROSource TF)  90 mL Per Tube BID   [START ON 08/25/2020] hydrocortisone sod succinate (SOLU-CORTEF) inj  50 mg Intravenous Q24H   insulin aspart  0-20 Units Subcutaneous Q4H   insulin detemir  20 Units Subcutaneous BID   mouth rinse  15 mL Mouth Rinse 10 times per day   metoprolol tartrate  12.5 mg Per  NG tube BID   multivitamin  1 tablet Per Tube QHS   polyethylene glycol  17 g Per Tube Daily   sodium chloride flush  10-40 mL Intracatheter Q12H   sodium chloride flush  3 mL Intravenous Q12H   sodium zirconium cyclosilicate  10 g Per Tube TID   Continuous Infusions:  sodium chloride     ceFEPime (MAXIPIME) IV 1 g (08/23/20 1735)   dexmedetomidine (PRECEDEX) IV infusion     famotidine (PEPCID) IV 20 mg (08/24/20 1115)   feeding supplement (NEPRO CARB STEADY) 1,000 mL (08/21/20 1659)   fentaNYL infusion INTRAVENOUS 150 mcg/hr (08/24/20 0748)   heparin 1,350 Units/hr (08/24/20 0618)   midazolam 2 mg/hr (08/24/20 0746)   PRN Meds:.sodium chloride, acetaminophen (TYLENOL) oral liquid 160 mg/5 mL, docusate, fentaNYL, heparin, metoprolol tartrate, midazolam, sodium chloride flush, sodium chloride flush   Assessment & Plan: Pt is a 70 y.o.   male with coronary artery disease, diabetes, glaucoma, history of kidney stones, hyperlipidemia, hypertension, left  knee replacement, lithotripsy, basal cell carcinoma, was admitted on 3/49/1791 with Metabolic acidosis [T05.6] Encounter for central line placement [Z45.2] Acute renal failure (ARF) ([place]) [N17.9] Acute respiratory failure with hypoxia ([place]) [J96.01] Sepsis, due to unspecified organism, unspecified whether acute organ dysfunction present ([place]) [A41.9] COVID-19 [U07.1]   #Acute kidney injury Baseline creatinine 1.0 from August 02, 2020 Presenting creatinine of 2.15 Lab Results  Component Value Date   CREATININE 5.23 (H) 08/24/2020   CREATININE 6.33 (H) 08/23/2020   CREATININE 6.15 (H) 08/23/2020    #Acute respiratory failure Requiring ventilator support.  Intubated 08/14/2020  #COVID-19 pneumonia Treated with ivermectin as outpatient  #Diabetes type 2, insulin dependent Treated with metformin, Jardiance (empagliflozin), Amaryl as outpatient Hemoglobin A1c 7.9% from June 26, 2020 Lab Results  Component Value Date   HGBA1C 7.5 08/10/2017    #Severe acidosis/hyperkalemia Treated with dialysis and Lokelma    Plan: Patient continues to have oliguric acute kidney injury with only 135 cc of urine over the last 24 hours. Continues to have azotemia with a BUN of 137 and creatinine of 5.23. Therefore he continues to be in need of dialysis. Dialysis to be performed today. Hyperkalemia improved with dialysis and Lokelma. Potassium currently 5.1. Otherwise continue supportive care for treatment of COVID-19 pneumonia and subsequent acute respiratory failure.   LOS: 11 Artist Bloom 1/21/20221:07 PM    Note: This note was prepared with Dragon dictation. Any transcription errors are unintentional

## 2020-08-24 NOTE — Progress Notes (Signed)
eeg done °

## 2020-08-24 NOTE — Progress Notes (Signed)
ANTICOAGULATION CONSULT NOTE  Pharmacy Consult for heparin Indication: atrial fibrillation  Patient Measurements: Heparin Dosing Weight: 90 kg  Labs: Recent Labs    08/22/20 0352 08/22/20 1337 08/23/20 0047 08/23/20 0540 08/23/20 1154 08/24/20 0530 08/24/20 1452  HGB 11.1*  --   --   --  9.2* 8.2*  --   HCT 29.6*  --   --   --  RESULTS UNAVAILABLE DUE TO INTERFERING SUBSTANCE 20.7*  --   PLT 106*  --   --   --  144* 153  --   HEPARINUNFRC 0.34  --   --  0.55  --  0.72* 0.12*  CREATININE 5.57*   < > 6.15* 6.33*  --  5.23*  --    < > = values in this interval not displayed.   Estimated Creatinine Clearance: 17.9 mL/min (A) (by C-G formula based on SCr of 5.23 mg/dL (H)).  Medical History: Past Medical History:  Diagnosis Date  . CAD (coronary artery disease)   . Cataract   . CHICKENPOX, HX OF 08/30/2008   Qualifier: Diagnosis of  By: [name], [name]    . Chronic kidney disease   . Diabetes mellitus   . Glaucoma   . History of urinary calculi 08/30/2008   Qualifier: Diagnosis of  By: [name], Jacelyn Pi   . Hyperlipidemia   . Hypertension   . Renal calculus or stone   . Skin cancer of nose    ears and hands   Assessment: 70 year old male admitted with severe DKA, which has since resolved and patient now on SQ insulin regimen. Patient with worsening renal function, plan to start HD 1/13. Patient with new afib 1/13, started on amiodarone and heparin.   Date Time HL Rate/comment 1/15 0429 0.42 1200 units/hr - plts trending down 1/16 0302 0.28 1200 units/hr 1/16 1400 0.36 1300 units/hr 1/16 2226 0.33 1300 units/hr 1/17 0319 0.31 1300 units/hr - plts trending down 1/18 0400 0.27 1300 units/hr - plts improved, H/H slightly worse 1/18 1510 0.44 1400 units/hr - H/H trending down, plts stable 1/19 0032 0.38 1400 units/hr - therapeutic x 2 1/20 0540 0.55 1400 units/hr - therapeutic x 3 1/21 0530 0.72 1400 units/hr - slightly supratherapeutic 1/21 1452 0.12 1350 units/hr -  subtherapeutic  Goal of Therapy:  Heparin level 0.3-0.7 units/ml Monitor platelets by anticoagulation protocol: Yes   Plan:  Heparin 1500 unit bolus followed by increase in drip to 1550 units/hr. Recheck HL 1/22 at 0100. CBC daily while on heparin drip.   Tawnya Crook, PharmD 08/24/2020,3:38 PM

## 2020-08-24 NOTE — Progress Notes (Signed)
NAME:  Nathaniel Thomas, MRN:  [id number], DOB:  1951-07-25, LOS: 50 ADMISSION DATE:  08/08/2020, CONSULTATION DATE:  08/24/2020 REFERRING MD:  Raliegh Ip CHIEF COMPLAINT:  Respiratory failure  Brief History:  70 y.o.malewith PMH as noted below who presents with altered mental status after he was found unresponsive this morning.   Per EMS he was initially hypoxic although this appears to have resolved. The patient himself is unable to give any history.  Patient Dx with COVID 1st week of Jan 2022 +NVD for several days ER shows severe acidosis with elevated sugars Patient with severe hypothermia and intubated for severe resp failure  Past Medical History:  He,  has a past medical history of CAD (coronary artery disease), Cataract, CHICKENPOX, HX OF (08/30/2008), Chronic kidney disease, Diabetes mellitus, Glaucoma, History of urinary calculi (08/30/2008), Hyperlipidemia, Hypertension, Renal calculus or stone, and Skin cancer of nose.   Significant Hospital Events:  1/10 admitted to ICU, severe DKA, gastroentertitis 1/11 severe resp failure 1/12 severe DKA 1/13 severe resp failure, worsening Renal failure 1/14- Reviewed care plan with cardiologist Dr Ubaldo Glassing, plan for cardiac cardoversion. Patient remains in shock with AFRVR on MV, medically optimizing for procedure.  I met with daughter York Cerise.   1/15- patient improved slightly, now with rate controlled AF off Neo. Discussed case with cardiology.  08/19/20- vitals are improved, rate controlled.  BP normalized.  Ventilator weaned to FiO2 60% remains crtically ill.   08/23/20 - vent weaned to 40% FiO2 and PEEP 5  Consults:  Cardiology Nephrology  Procedures:  08/16/20 CVL 08/05/2020 CVL 08/05/2020 ETT  Significant Diagnostic Tests:  CT Head 08/08/2020 Negative motion degraded head CT.  Micro Data:  Tracheal Aspirate 08/18/20 - Serratia Marcescens   Antimicrobials:  Vancomycin 1/10 Cefepime 1/10   Interim History / Subjective:  No acute events  overnight. This a.m. left lower extremity cold.  Dialysis planned not planned for today. Potassium 5.1  Objective   Blood pressure (!) 125/58, pulse 99, temperature (!) 101.7 F (38.7 C), resp. rate (!) 35, height 6' 3.98" (1.93 m), weight 110.4 kg, SpO2 93 %.    Vent Mode: PRVC FiO2 (%):  [0.4 %-40 %] 40 % Set Rate:  [22 bmp] 22 bmp Vt Set:  [520 mL] 520 mL PEEP:  [5 cmH20] 5 cmH20   Intake/Output Summary (Last 24 hours) at 08/24/2020 1715 Last data filed at 08/24/2020 1000 Gross per 24 hour  Intake 30 ml  Output -1365 ml  Net 1395 ml   Filed Weights   08/22/20 0500 08/23/20 0500 08/24/20 0500  Weight: 108.5 kg 112.3 kg 110.4 kg    Examination: Physical examination is limited due to need for PPE/CAPR General: chronically ill appearing male, sedated HENT: [place]/AT, ET tube in place, moist mucous membranes Lungs: Coarse breath sounds. No wheezing Cardiovascular: Monitor shows sinus abdomen: soft, mildly distended, BS+ Extremities: 2+ anasarca, both lower extremities mottled, right lower extremity cool to touch with weak pulses left lower extremity cold to touch, dopplerable pulses Neuro: sedated, not responsive GU: foley in place, massive scrotal edema   Scheduled Meds: . amiodarone  200 mg Per Tube BID  . chlorhexidine gluconate (MEDLINE KIT)  15 mL Mouth Rinse BID  . Chlorhexidine Gluconate Cloth  6 each Topical Daily  . docusate  100 mg Per Tube BID  . feeding supplement (PROSource TF)  90 mL Per Tube BID  . heparin  1,500 Units Intravenous Once  . [START ON 08/25/2020] hydrocortisone sod succinate (SOLU-CORTEF) inj  50 mg  Intravenous Q24H  . insulin aspart  0-20 Units Subcutaneous Q4H  . insulin detemir  20 Units Subcutaneous BID  . mouth rinse  15 mL Mouth Rinse 10 times per day  . metoprolol tartrate  12.5 mg Per NG tube BID  . multivitamin  1 tablet Per Tube QHS  . polyethylene glycol  17 g Per Tube Daily  . sodium chloride flush  10-40 mL Intracatheter Q12H  .  sodium chloride flush  3 mL Intravenous Q12H  . sodium zirconium cyclosilicate  10 g Per Tube TID   Continuous Infusions: . sodium chloride    . ceFEPime (MAXIPIME) IV 1 g (08/23/20 1735)  . dexmedetomidine (PRECEDEX) IV infusion    . famotidine (PEPCID) IV 20 mg (08/24/20 1115)  . feeding supplement (NEPRO CARB STEADY) 1,000 mL (08/21/20 1659)  . fentaNYL infusion INTRAVENOUS 150 mcg/hr (08/24/20 0748)  . heparin 1,350 Units/hr (08/24/20 0618)  . midazolam 2 mg/hr (08/24/20 0746)   PRN Meds:.sodium chloride, acetaminophen (TYLENOL) oral liquid 160 mg/5 mL, docusate, fentaNYL, heparin, metoprolol tartrate, midazolam, sodium chloride flush, sodium chloride flush   Resolved Hospital Problem list     Assessment & Plan:  Acute hypoxemic Respiratory failure due to Covid 19 Pneumonia - Continue mechanical ventilatory support, on 40% FiO2 and PEEP 5 - Will start to wean sedation despite issues with agonal breathing  - Serratia Marcesans pneumonia, treating with cefepime -Respiratory culture results from 1/18 confirmed Serratia sensitive to cefepime  Acute Kidney Injury, oliguric Hyperkalemia - Nephrology following - No  HD today - Continue lokelma 21m TID  Hypoglycemia -resolved Continue to monitor glucoses  Shock - he has been weaned off vasopressor support - Stress dose steroids to q 24 hours, continue to wean    Encephalopathy Suspect encephalopathy due to COVID-19 In setting of multiorgan failure - requiring high amounts of sedation - EEG shows severe diffuse encephalopathy without seizures - CT 1/10 negative but motion degradation present - CT head 1/20: Negative - Will need to wean off versed if possible in coming days  Atrial Fibrillation - continue amiodarone PO - on metoprolol 12.587mBID - Off diltiazem gtt - heparin dripp  Type II Diabetes  DKA- resolved --Blood sugars stable --Resume SSI and levemir 20 units BID  --Monitor closely given recent  hypoglycemic events  Leukocytosis Fever - check blood cultures - PICC line ordered and done today - Requiring Foley due to massive scrotal edema  Best practice (evaluated daily)  Diet: TF Pain/Anxiety/Delirium protocol (if indicated): fentanyl, precedex, versed VAP protocol (if indicated): ordered DVT prophylaxis: heparin GI prophylaxis: PPI  Glucose control: SSI Mobility: bed rest Disposition: ICU Code Status: Full code.  Spoke with daughter and wife on phone 08/22/20. All questions answered. Confirmed full code status for now.   Labs   CBC: Recent Labs  Lab 08/20/20 0319 08/21/20 0400 08/22/20 0352 08/23/20 1154 08/24/20 0530  WBC 17.4* 25.6* 25.3* 25.3* 22.3*  HGB 11.6* 10.0* 11.1* 9.2* 8.2*  HCT 31.7* 26.9* 29.6* RESULTS UNAVAILABLE DUE TO INTERFERING SUBSTANCE 20.7*  MCV 80.7 79.8* 78.7* RESULTS UNAVAILABLE DUE TO INTERFERING SUBSTANCE 75.5*  PLT 100* 112* 106* 144* 15009  Basic Metabolic Panel: Recent Labs  Lab 08/20/20 0319 08/21/20 0400 08/21/20 1832 08/22/20 0352 08/22/20 1337 08/22/20 2030 08/23/20 0047 08/23/20 0540 08/24/20 0530  NA 139 137   < > 136 132* 134* 132* 133* 132*  K 4.9 6.3*   < > 6.1* 6.5* 6.3* 6.1* 6.2* 5.1  CL 102 98   < >  95* 94* 93* 94* 94* 94*  CO2 21* 19*   < > 22 19* 21* 19* 20* 20*  GLUCOSE 116* 123*   < > 112* 303* 266* 252* 229* 138*  BUN 147* 178*   < > 144* 153* 167* 168* 183* 137*  CREATININE 5.93* 6.64*   < > 5.57* 5.91* 6.32* 6.15* 6.33* 5.23*  CALCIUM 6.4* 6.3*   < > 7.0* 7.1* 7.1* 7.3* 7.5* 7.3*  MG 2.0 2.2  --  2.0  --   --   --  2.4 2.1  PHOS 7.5* 9.2*  --  8.3* 9.8* 9.6*  --  10.1* 7.8*   < > = values in this interval not displayed.   GFR: Estimated Creatinine Clearance: 17.9 mL/min (A) (by C-G formula based on SCr of 5.23 mg/dL (H)). Recent Labs  Lab 08/21/20 0400 08/22/20 0352 08/23/20 1154 08/24/20 0530  WBC 25.6* 25.3* 25.3* 22.3*    Liver Function Tests: Recent Labs  Lab 08/21/20 0400  08/22/20 0352 08/22/20 1337 08/23/20 0540 08/24/20 0530  AST  --  158*  --   --   --   ALT  --  166*  --   --   --   ALKPHOS  --  316*  --   --   --   BILITOT  --  1.1  --   --   --   PROT  --  5.3*  --   --   --   ALBUMIN 1.6* 1.5*  1.5* 1.4* 1.4* 1.4*   No results for input(s): LIPASE, AMYLASE in the last 168 hours. No results for input(s): AMMONIA in the last 168 hours.  ABG    Component Value Date/Time   PHART 7.39 08/23/2020 0830   PCO2ART 32 08/23/2020 0830   PO2ART 86 08/23/2020 0830   HCO3 19.4 (L) 08/23/2020 0830   ACIDBASEDEF 4.9 (H) 08/23/2020 0830   O2SAT 96.4 08/23/2020 0830     Coagulation Profile: No results for input(s): INR, PROTIME in the last 168 hours.  Cardiac Enzymes: No results for input(s): CKTOTAL, CKMB, CKMBINDEX, TROPONINI in the last 168 hours.  HbA1C: Hemoglobin A1C  Date/Time Value Ref Range Status  08/10/2017 11:53 AM 7.5  Final  04/09/2017 08:30 AM 7.3  Final   Hgb A1c MFr Bld  Date/Time Value Ref Range Status  01/11/2015 08:33 AM 7.5 (H) 4.6 - 6.5 % Final    Comment:    Glycemic Control Guidelines for People with Diabetes:Non Diabetic:  <6%Goal of Therapy: <7%Additional Action Suggested:  >8%   07/31/2014 03:27 PM 7.2 (H) 4.6 - 6.5 % Final    Comment:    Glycemic Control Guidelines for People with Diabetes:Non Diabetic:  <6%Goal of Therapy: <7%Additional Action Suggested:  >8%     CBG: Recent Labs  Lab 08/23/20 1640 08/23/20 1934 08/23/20 2337 08/24/20 0429 08/24/20 0723  GLUCAP 154* 150* 149* 116* 124*    Critical care time: 45 minutes     Patient discussed during multidisciplinary rounds with the ICU team  C. Derrill Kay, MD [facility]   *This note was dictated using voice recognition software/Dragon.  Despite best efforts to proofread, errors can occur which can change the meaning.  Any change was purely unintentional.

## 2020-08-24 NOTE — Progress Notes (Signed)
All sedation weaned off except 50 mg of Fentanyl per hour. Right TLC removed after PICC line inserted. Remains in NSR/STac.

## 2020-08-24 NOTE — Progress Notes (Signed)
ANTICOAGULATION CONSULT NOTE  Pharmacy Consult for heparin Indication: atrial fibrillation  Patient Measurements: Heparin Dosing Weight: 90 kg  Labs: Recent Labs    08/22/20 0352 08/22/20 1337 08/22/20 2030 08/23/20 0047 08/23/20 0540 08/23/20 1154 08/24/20 0530  HGB 11.1*  --   --   --   --  9.2*  --   HCT 29.6*  --   --   --   --  RESULTS UNAVAILABLE DUE TO INTERFERING SUBSTANCE  --   PLT 106*  --   --   --   --  144*  --   HEPARINUNFRC 0.34  --   --   --  0.55  --  0.72*  CREATININE 5.57*   < > 6.32* 6.15* 6.33*  --   --    < > = values in this interval not displayed.   Estimated Creatinine Clearance: 14.8 mL/min (A) (by C-G formula based on SCr of 6.33 mg/dL (H)).  Medical History: Past Medical History:  Diagnosis Date  . CAD (coronary artery disease)   . Cataract   . CHICKENPOX, HX OF 08/30/2008   Qualifier: Diagnosis of  By: [name], [name]    . Chronic kidney disease   . Diabetes mellitus   . Glaucoma   . History of urinary calculi 08/30/2008   Qualifier: Diagnosis of  By: [name], Jacelyn Pi   . Hyperlipidemia   . Hypertension   . Renal calculus or stone   . Skin cancer of nose    ears and hands   Assessment: 70 year old male admitted with severe DKA, which has since resolved and patient now on SQ insulin regimen. Patient with worsening renal function, plan to start HD 1/13. Patient with new afib 1/13, started on amiodarone and heparin.   Date Time HL Rate/comment 1/15 0429 0.42 @1200  units/hr - plts trending down 1/16 0302 0.28 @1200  units/hr 1/16 1400 0.36 @1300  units/hr 1/16 2226 0.33 @1300  units/hr 1/17 0319 0.31 @1300  units/hr - plts trending down 1/18 0400 0.27 @1300  units/hr - plts improved, H/H slightly worse 1/18 1510 0.44 @1400  units/hr - H/H trending down, plts stable 1/19 0032 0.38 @1400  units/hr - therapeutic x 2 1/20 0540 0.55 @1400  units/hr - therapeutic x 3 1/21 0530 0.72 @1400  units/hr - slightly supratherapeutic  Goal of Therapy:   Heparin level 0.3-0.7 units/ml Monitor platelets by anticoagulation protocol: Yes   Plan:  Will decrease heparin infusion to 1350 units/hr and recheck HL in ~ 8 hours  Check HL and CBC daily per protocol   Ena Dawley, PharmD 08/24/2020,6:14 AM

## 2020-08-24 NOTE — Procedures (Addendum)
Patient Name: Nathaniel Thomas  MRN: [id number]  Epilepsy Attending: Lora Havens  Referring Physician/Provider: Dr. Freda Jackson Date: 08/24/2020 Duration: 21.04 mins  Patient history: 70 year old male with altered mental status.  EEG to evaluate for seizures.  Level of alertness: Comatose  AEDs during EEG study: Versed  Technical aspects: This EEG study was done with scalp electrodes positioned according to the 10-20 International system of electrode placement. Electrical activity was acquired at a sampling rate of 500Hz  and reviewed with a high frequency filter of 70Hz  and a low frequency filter of 1Hz . EEG data were recorded continuously and digitally stored.   Description: EEG showed continuous generalized 3 to 5 Hz theta-delta slowing.  Of note, this was a technically difficult study due to significant sweat artifact. Hyperventilation and photic stimulation were not performed.     ABNORMALITY -Continuous slow, generalized  IMPRESSION: This technically difficult study is suggestive of severe diffuse encephalopathy, nonspecific etiology.  No seizures or epileptiform discharges were seen throughout the recording.  If concern for ictal-interictal activity persists, please consider repeat study.  [name]

## 2020-08-25 ENCOUNTER — Inpatient Hospital Stay: Payer: HMO

## 2020-08-25 LAB — CBC
Hemoglobin: 7 g/dL — ABNORMAL LOW (ref 13.0–17.0)
Platelets: 139 10*3/uL — ABNORMAL LOW (ref 150–400)
WBC: 22.2 10*3/uL — ABNORMAL HIGH (ref 4.0–10.5)
nRBC: 0 % (ref 0.0–0.2)

## 2020-08-25 LAB — CBC WITH DIFFERENTIAL/PLATELET
Abs Immature Granulocytes: 0.36 10*3/uL — ABNORMAL HIGH (ref 0.00–0.07)
Basophils Absolute: 0 10*3/uL (ref 0.0–0.1)
Basophils Relative: 0 %
Eosinophils Absolute: 0 10*3/uL (ref 0.0–0.5)
Eosinophils Relative: 0 %
HCT: 19.4 % — ABNORMAL LOW (ref 39.0–52.0)
Hemoglobin: 7.2 g/dL — ABNORMAL LOW (ref 13.0–17.0)
Immature Granulocytes: 2 %
Lymphocytes Relative: 2 %
Lymphs Abs: 0.4 10*3/uL — ABNORMAL LOW (ref 0.7–4.0)
MCH: 30.3 pg (ref 26.0–34.0)
MCHC: 37.1 g/dL — ABNORMAL HIGH (ref 30.0–36.0)
MCV: 81.5 fL (ref 80.0–100.0)
Monocytes Absolute: 0.5 10*3/uL (ref 0.1–1.0)
Monocytes Relative: 2 %
Neutro Abs: 23 10*3/uL — ABNORMAL HIGH (ref 1.7–7.7)
Neutrophils Relative %: 94 %
Platelets: 160 10*3/uL (ref 150–400)
RBC: 2.38 MIL/uL — ABNORMAL LOW (ref 4.22–5.81)
RDW: 17.9 % — ABNORMAL HIGH (ref 11.5–15.5)
WBC: 24.3 10*3/uL — ABNORMAL HIGH (ref 4.0–10.5)
nRBC: 0 % (ref 0.0–0.2)

## 2020-08-25 LAB — GLUCOSE, CAPILLARY
Glucose-Capillary: 161 mg/dL — ABNORMAL HIGH (ref 70–99)
Glucose-Capillary: 166 mg/dL — ABNORMAL HIGH (ref 70–99)
Glucose-Capillary: 175 mg/dL — ABNORMAL HIGH (ref 70–99)
Glucose-Capillary: 177 mg/dL — ABNORMAL HIGH (ref 70–99)
Glucose-Capillary: 187 mg/dL — ABNORMAL HIGH (ref 70–99)
Glucose-Capillary: 216 mg/dL — ABNORMAL HIGH (ref 70–99)

## 2020-08-25 LAB — RENAL FUNCTION PANEL
Albumin: 1.3 g/dL — ABNORMAL LOW (ref 3.5–5.0)
Anion gap: 20 — ABNORMAL HIGH (ref 5–15)
BUN: 166 mg/dL — ABNORMAL HIGH (ref 8–23)
CO2: 20 mmol/L — ABNORMAL LOW (ref 22–32)
Calcium: 6.6 mg/dL — ABNORMAL LOW (ref 8.9–10.3)
Chloride: 92 mmol/L — ABNORMAL LOW (ref 98–111)
Creatinine, Ser: 6.03 mg/dL — ABNORMAL HIGH (ref 0.61–1.24)
GFR, Estimated: 9 mL/min — ABNORMAL LOW (ref >60)
Glucose, Bld: 226 mg/dL — ABNORMAL HIGH (ref 70–99)
Phosphorus: 10 mg/dL — ABNORMAL HIGH (ref 2.5–4.6)
Potassium: 5.2 mmol/L — ABNORMAL HIGH (ref 3.5–5.1)
Sodium: 132 mmol/L — ABNORMAL LOW (ref 135–145)

## 2020-08-25 LAB — PHOSPHORUS: Phosphorus: 9.4 mg/dL — ABNORMAL HIGH (ref 2.5–4.6)

## 2020-08-25 LAB — HEPARIN LEVEL (UNFRACTIONATED)
Heparin Unfractionated: 0.65 IU/mL (ref 0.30–0.70)
Heparin Unfractionated: 0.3 IU/mL (ref 0.30–0.70)

## 2020-08-25 LAB — MAGNESIUM: Magnesium: 2.3 mg/dL (ref 1.7–2.4)

## 2020-08-25 IMAGING — DX DG CHEST 1V PORT
1 series · 1 of 1 positions shown · non-contrast
Comparison: Chest radiograph [DATE]

CLINICAL DATA: [O7].  Mental status change.

EXAM:
PORTABLE CHEST 1 VIEW

[chest ap]
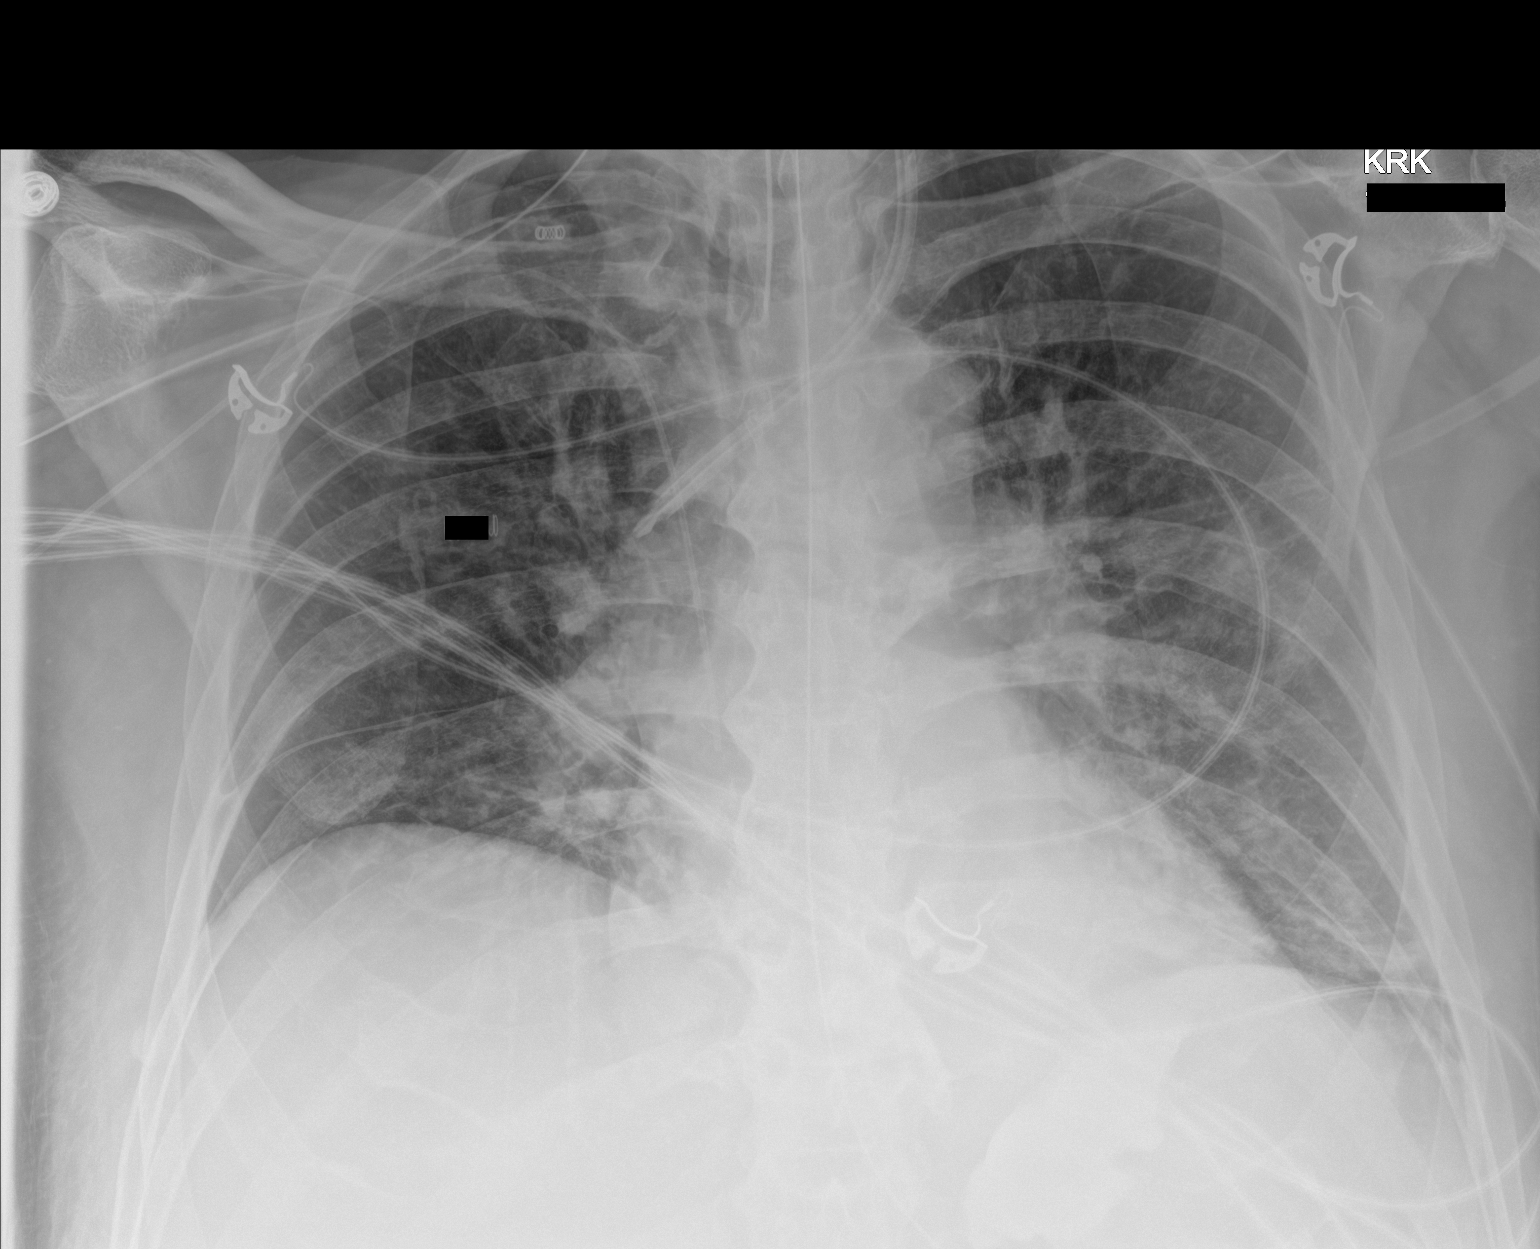

[1 of 1 positions shown; findings below may reference images not displayed]

FINDINGS: ET tube mid trachea. Enteric tube courses inferior to the diaphragm.
Central venous catheter tip projects over the superior vena cava.
Right upper extremity PICC line tip projects over the superior vena
cava. Stable cardiac and mediastinal contours. Elevation right
hemidiaphragm. Similar diffuse bilateral airspace opacities. No
pleural effusion or pneumothorax.
IMPRESSION: Support apparatus as above.

Similar-appearing diffuse bilateral airspace opacities.

## 2020-08-25 NOTE — Progress Notes (Signed)
Neuro: Pt on no sedation and still unable to follow commands at this time.    Respiratory: Pt remains on ventilator at this time with vent settings at [facility] 40%/5/22/520. O2 sats WNL.   Cardiovascular: Pt remains in sinus rhythm with no ectopy at this time. BP and temp WNL. Pt with generalized edema and mottling to lower extremities.    GI/GU: Pt with foley in place with low urine output. Pt with 163ml of urine output throughout shift. Hd planned for tomorrow. OG tube in placr and tube feeds going at this time. Pt tolerating feeds with minimal residuals.    Skin: No new s/s of skin breakdown at this time. Pressure ulcer on sacrum and dressing changed. Pt continues to be a Copy turn.   Pain: Pt with no signs of pain at this time.    Events: NO acute events throughout shift. Pts plan of care to continue with current regimen, Family updated and no further questions at this time.

## 2020-08-25 NOTE — Progress Notes (Signed)
[facility]  ROUNDING NOTE   Subjective:   [name] admitted to [facility] on 3/79/0240 for Metabolic acidosis [X73.5] Encounter for central line placement [Z45.2] Acute renal failure (ARF) (Nathaniel Thomas) [N17.9] Acute respiratory failure with hypoxia (Nathaniel Thomas) [J96.01] Sepsis, due to unspecified organism, unspecified whether acute organ dysfunction present (Nathaniel Thomas) [A41.9] COVID-19 [U07.1]   Patient presented to the ER via EMS from home for altered mental status.  Recently treated for COVID. History taken from daughter on the phone and wife at bedside. Patient is unvaccinated.   Started on dialysis on 1/13. Intermittent treatments on 1/13, 1/15, 1/18, 1/20  UOP 462m  Objective:  Vital signs in last 24 hours:  Temp:  [98.78 F (37.1 C)-102.4 F (39.1 C)] 98.78 F (37.1 C) (01/22 1000) Pulse Rate:  [73-104] 73 (01/22 0845) Resp:  [24-36] 29 (01/22 1000) BP: (117-150)/(56-78) 139/64 (01/22 1000) SpO2:  [91 %-100 %] 98 % (01/22 1150) FiO2 (%):  [40 %] 40 % (01/22 1150) Weight:  [110.7 kg] 110.7 kg (01/22 0359)  Weight change: 0.3 kg Filed Weights   08/23/20 0500 08/24/20 0500 08/25/20 0359  Weight: 112.3 kg 110.4 kg 110.7 kg    Intake/Output: I/O last 3 completed shifts: In: 3175.9 [I.V.:2095.9; NG/GT:630; IV Piggyback:450] Out: 535 [Urine:535]   Intake/Output this shift:  Total I/O In: 186.4 [I.V.:141.6; IV Piggyback:44.9] Out: -   Physical Exam: General: Critically ill   Head: ETT  Eyes: Eyes closed  Neck: trachea midline  Lungs:  PRVC FiO2 40%  Heart: Regular rate and rhythm  Abdomen:  Soft, +bowel sounds  Extremities:  + peripheral edema.  Neurologic: Intubated and sedate  Skin: No lesions  Access: Right femoral temp HD catheter 13/29   Basic Metabolic Panel: Recent Labs  Lab 08/21/20 0400 08/21/20 1832 08/22/20 0352 08/22/20 1337 08/22/20 2030 08/23/20 0047 08/23/20 0540 08/24/20 0530 08/25/20 0304  NA 137   < > 136 132* 134* 132* 133*  132* 132*  K 6.3*   < > 6.1* 6.5* 6.3* 6.1* 6.2* 5.1 5.2*  CL 98   < > 95* 94* 93* 94* 94* 94* 92*  CO2 19*   < > 22 19* 21* 19* 20* 20* 20*  GLUCOSE 123*   < > 112* 303* 266* 252* 229* 138* 226*  BUN 178*   < > 144* 153* 167* 168* 183* 137* 166*  CREATININE 6.64*   < > 5.57* 5.91* 6.32* 6.15* 6.33* 5.23* 6.03*  CALCIUM 6.3*   < > 7.0* 7.1* 7.1* 7.3* 7.5* 7.3* 6.6*  MG 2.2  --  2.0  --   --   --  2.4 2.1 2.3  PHOS 9.2*  --  8.3* 9.8* 9.6*  --  10.1* 7.8* 9.4*  10.0*   < > = values in this interval not displayed.    Liver Function Tests: Recent Labs  Lab 08/22/20 0352 08/22/20 1337 08/23/20 0540 08/24/20 0530 08/25/20 0304  AST 158*  --   --   --   --   ALT 166*  --   --   --   --   ALKPHOS 316*  --   --   --   --   BILITOT 1.1  --   --   --   --   PROT 5.3*  --   --   --   --   ALBUMIN 1.5*  1.5* 1.4* 1.4* 1.4* 1.3*   No results for input(s): LIPASE, AMYLASE in the last 168 hours. No  results for input(s): AMMONIA in the last 168 hours.  CBC: Recent Labs  Lab 08/21/20 0400 08/22/20 0352 08/23/20 1154 08/24/20 0530 08/25/20 0827  WBC 25.6* 25.3* 25.3* 22.3* 22.2*  HGB 10.0* 11.1* 9.2* 8.2* 7.0*  HCT 26.9* 29.6* RESULTS UNAVAILABLE DUE TO INTERFERING SUBSTANCE 20.7* RESULTS UNAVAILABLE DUE TO INTERFERING SUBSTANCE  MCV 79.8* 78.7* RESULTS UNAVAILABLE DUE TO INTERFERING SUBSTANCE 75.5* RESULTS UNAVAILABLE DUE TO INTERFERING SUBSTANCE  PLT 112* 106* 144* 153 139*    Cardiac Enzymes: No results for input(s): CKTOTAL, CKMB, CKMBINDEX, TROPONINI in the last 168 hours.  BNP: Invalid input(s): POCBNP  CBG: Recent Labs  Lab 08/24/20 2037 08/24/20 2355 08/25/20 0350 08/25/20 0734 08/25/20 1104  GLUCAP 162* 179* 177* 166* 161*    Microbiology: Results for orders placed or performed during the hospital encounter of 08/22/2020  Blood Culture (routine x 2)     Status: None   Collection Time: 08/23/2020  9:50 AM   Specimen: BLOOD  Result Value Ref Range Status    Specimen Description BLOOD LEFT [facility]  Final   Special Requests   Final    BOTTLES DRAWN AEROBIC AND ANAEROBIC Blood Culture adequate volume   Culture   Final    NO GROWTH 5 DAYS Performed at [facility], [address] [place], [place] [address]    Report Status 08/18/2020 FINAL  Final  Blood Culture (routine x 2)     Status: None   Collection Time: 08/21/2020  9:50 AM   Specimen: BLOOD  Result Value Ref Range Status   Specimen Description BLOOD RIGHT AC  Final   Special Requests   Final    BOTTLES DRAWN AEROBIC AND ANAEROBIC Blood Culture results may not be optimal due to an excessive volume of blood received in culture bottles   Culture   Final    NO GROWTH 5 DAYS Performed at [facility] [address] [place], [place] [address]    Report Status 08/18/2020 FINAL  Final  Urine culture     Status: None   Collection Time: 08/23/2020 11:36 AM   Specimen: Urine, Random  Result Value Ref Range Status   Specimen Description   Final    URINE, RANDOM Performed at [facility], [address], [place], [place] [address]    Special Requests   Final    NONE Performed at [facility], [address] [place], [place] [address]    Culture   Final    NO GROWTH Performed at [facility], [place] [address] [place], [place] [address]    Report Status 08/14/2020 FINAL  Final  Culture, respiratory     Status: None   Collection Time: 08/17/20  3:15 PM   Specimen: Tracheal Aspirate; Respiratory  Result Value Ref Range Status   Specimen Description   Final    TRACHEAL ASPIRATE Performed at [facility], [address] [place], [place] [address]    Special Requests   Final    NONE Performed at [facility], [place], [place] [address]    Gram Stain NO WBC SEEN MODERATE GRAM VARIABLE ROD   Final   Culture   Final    ABUNDANT SERRATIA MARCESCENS SUSCEPTIBILITIES PERFORMED ON PREVIOUS CULTURE WITHIN THE LAST  5 DAYS. Performed at [facility], [place] [address] [place],  [address]    Report Status 08/20/2020 FINAL  Final  Culture, respiratory     Status: None   Collection Time:  08/18/20 11:09 AM   Specimen: Tracheal Aspirate; Respiratory  Result Value Ref Range Status   Specimen Description   Final    TRACHEAL ASPIRATE Performed at [facility], [address] [place], [place] [address]    Special Requests   Final    NONE Performed at [facility], [place], [place] [address]    Gram Stain   Final    MODERATE WBC PRESENT, PREDOMINANTLY PMN MODERATE GRAM NEGATIVE RODS FEW GRAM VARIABLE ROD RARE GRAM POSITIVE COCCI Performed at [facility], [place] [address], [place], [place] [address]    Culture ABUNDANT SERRATIA MARCESCENS  Final   Report Status 08/20/2020 FINAL  Final   Organism ID, Bacteria SERRATIA MARCESCENS  Final      Susceptibility   Serratia marcescens - MIC*    CEFAZOLIN >=64 RESISTANT Resistant     CEFEPIME <=0.12 SENSITIVE Sensitive     CEFTAZIDIME <=1 SENSITIVE Sensitive     CEFTRIAXONE <=0.25 SENSITIVE Sensitive     CIPROFLOXACIN <=0.25 SENSITIVE Sensitive     GENTAMICIN <=1 SENSITIVE Sensitive     TRIMETH/SULFA <=20 SENSITIVE Sensitive     * ABUNDANT SERRATIA MARCESCENS  MRSA PCR Screening     Status: None   Collection Time: 08/18/20 12:52 PM   Specimen: Nasopharyngeal  Result Value Ref Range Status   MRSA by PCR NEGATIVE NEGATIVE Final    Comment:        The GeneXpert MRSA Assay (FDA approved for NASAL specimens only), is one component of a comprehensive MRSA colonization surveillance program. It is not intended to diagnose MRSA infection nor to guide or monitor treatment for MRSA infections. Performed at [facility], [address] [place], [place] [address]   Culture, respiratory (non-expectorated)     Status: None   Collection Time: 08/21/20  4:52 PM   Specimen: Tracheal Aspirate;  Respiratory  Result Value Ref Range Status   Specimen Description   Final    TRACHEAL ASPIRATE Performed at [facility], [address] [place], [place] [address]    Special Requests   Final    NONE Performed at [facility], [address] [place], [place] [address]    Gram Stain   Final    FEW WBC PRESENT, PREDOMINANTLY PMN ABUNDANT GRAM VARIABLE ROD MODERATE GRAM POSITIVE COCCI Performed at [facility], [name] [address] [place], [place] [address]    Culture ABUNDANT SERRATIA MARCESCENS  Final   Report Status 08/24/2020 FINAL  Final   Organism ID, Bacteria SERRATIA MARCESCENS  Final      Susceptibility   Serratia marcescens - MIC*    CEFAZOLIN >=64 RESISTANT Resistant     CEFEPIME <=0.12 SENSITIVE Sensitive     CEFTAZIDIME <=1 SENSITIVE Sensitive     CEFTRIAXONE <=0.25 SENSITIVE Sensitive     CIPROFLOXACIN <=0.25 SENSITIVE Sensitive     GENTAMICIN <=1 SENSITIVE Sensitive     TRIMETH/SULFA <=20 SENSITIVE Sensitive     * ABUNDANT SERRATIA MARCESCENS  CULTURE, BLOOD (ROUTINE X 2) w Reflex to ID Panel     Status: None (Preliminary result)   Collection Time: 08/22/20  1:37 PM   Specimen: BLOOD  Result Value Ref Range Status   Specimen Description BLOOD BLOOD LEFT HAND  Final   Special Requests   Final    BOTTLES DRAWN AEROBIC AND ANAEROBIC Blood Culture adequate volume   Culture   Final    NO GROWTH 3 DAYS Performed at [facility], [address] [place], [place] [address]    Report Status PENDING  Incomplete  CULTURE, BLOOD (ROUTINE X 2) w Reflex to ID Panel     Status: None (Preliminary result)   Collection Time: 08/22/20  1:37 PM   Specimen: BLOOD  Result Value Ref Range Status   Specimen Description BLOOD LEFT ANTECUBITAL  Final   Special Requests   Final    BOTTLES DRAWN AEROBIC AND ANAEROBIC Blood Culture adequate volume   Culture   Final    NO GROWTH 3 DAYS Performed at [facility], [address]  [place], [place] [address]    Report Status PENDING  Incomplete    Coagulation Studies: No results for input(s): LABPROT, INR in the last 72 hours.  Urinalysis: No results for input(s): COLORURINE, LABSPEC, PHURINE, GLUCOSEU, HGBUR, BILIRUBINUR, KETONESUR, PROTEINUR, UROBILINOGEN, NITRITE, LEUKOCYTESUR in the last 72 hours.  Invalid input(s): APPERANCEUR    Imaging: EEG  Result Date: 08/24/2020 Lora Havens, MD     08/24/2020  2:11 PM Patient Name: Nathaniel Thomas MRN: [id number] Epilepsy Attending: Lora Havens Referring Physician/Provider: Dr. Freda Jackson Date: 08/24/2020 Duration: 21.04 mins Patient history: 70 year old male with altered mental status.  EEG to evaluate for seizures. Level of alertness: Comatose AEDs during EEG study: Versed Technical aspects: This EEG study was done with scalp electrodes positioned according to the 10-20 International system of electrode placement. Electrical activity was acquired at a sampling rate of 500Hz  and reviewed with a high frequency filter of 70Hz  and a low frequency filter of 1Hz . EEG data were recorded continuously and digitally stored. Description: EEG showed continuous generalized 3 to 5 Hz theta-delta slowing.  Of note, this was a technically difficult study due to significant sweat artifact. Hyperventilation and photic stimulation were not performed.   ABNORMALITY -Continuous slow, generalized IMPRESSION: This technically difficult study is suggestive of severe diffuse encephalopathy, nonspecific etiology.  No seizures or epileptiform discharges were seen throughout the recording. If concern for ictal-interictal activity persists, please consider repeat study. Lora Havens   CT HEAD WO CONTRAST  Result Date: 08/23/2020 CLINICAL DATA:  Mental status change.  COVID positive EXAM: CT HEAD WITHOUT CONTRAST TECHNIQUE: Contiguous axial images were obtained from the base of the skull through the vertex without intravenous contrast. COMPARISON:   CT head 08/08/2020 FINDINGS: Brain: No evidence of acute infarction, hemorrhage, hydrocephalus, extra-axial collection or mass lesion/mass effect. Vascular: Negative for hyperdense vessel Skull: Negative Sinuses/Orbits: Paranasal sinuses clear. Negative orbit. Left cataract extraction. Other: None IMPRESSION: Negative CT head Electronically Signed   By: Franchot Gallo M.D.   On: 08/23/2020 15:32     Medications:   . sodium chloride    . ceFEPime (MAXIPIME) IV Stopped (08/24/20 1759)  . famotidine (PEPCID) IV Stopped (08/25/20 9485)  . feeding supplement (NEPRO CARB STEADY) 1,000 mL (08/21/20 1659)  . fentaNYL infusion INTRAVENOUS Stopped (08/25/20 0846)  . heparin 1,550 Units/hr (08/25/20 1052)   . amiodarone  200 mg Per Tube BID  . chlorhexidine gluconate (MEDLINE KIT)  15 mL Mouth Rinse BID  . Chlorhexidine Gluconate Cloth  6 each Topical Daily  . docusate  100 mg Per Tube BID  . feeding supplement (PROSource TF)  90 mL Per Tube BID  . hydrocortisone sod succinate (SOLU-CORTEF) inj  50 mg Intravenous Q24H  . insulin aspart  0-20 Units Subcutaneous Q4H  . insulin detemir  20 Units Subcutaneous BID  . mouth rinse  15 mL Mouth Rinse 10 times  per day  . metoprolol tartrate  12.5 mg Per NG tube BID  . multivitamin  1 tablet Per Tube QHS  . polyethylene glycol  17 g Per Tube Daily  . sodium chloride flush  10-40 mL Intracatheter Q12H  . sodium chloride flush  3 mL Intravenous Q12H  . sodium zirconium cyclosilicate  10 g Per Tube TID   sodium chloride, acetaminophen (TYLENOL) oral liquid 160 mg/5 mL, docusate, fentaNYL, heparin, metoprolol tartrate, midazolam, sodium chloride flush, sodium chloride flush  Assessment/ Plan:  [name] is a 70 y.o. white male with coronary artery disease, diabetes, glaucoma, history of kidney stones, hyperlipidemia, hypertension, left knee replacement, lithotripsy, basal cell carcinoma, was admitted on 09/25/4112 with Metabolic acidosis  [Y43.1] Encounter for central line placement [Z45.2] Acute renal failure (ARF) ([place]) [N17.9] Acute respiratory failure with hypoxia ([place]) [J96.01] Sepsis, due to unspecified organism, unspecified whether acute organ dysfunction present ([place]) [A41.9] COVID-19 [U07.1]   #Acute kidney injury Baseline creatinine 1.0 from August 02, 2020 Presenting creatinine of 2.15 Recent Labs       Lab Results  Component Value Date   CREATININE 5.23 (H) 08/24/2020   CREATININE 6.33 (H) 08/23/2020   CREATININE 6.15 (H) 08/23/2020     Dialysis for later today or tomorrow. Monitor daily for dialysis need.   #Acute respiratory failure Requiring ventilator support.  Intubated 08/14/2020  #COVID-19 pneumonia Treated with ivermectin as outpatient  #Diabetes type 2, insulin dependent: DKA on admission Outpatient regimen of metformin, Jardiance (empagliflozin), Amaryl as outpatient Hemoglobin A1c 7.9% from June 26, 2020 Recent Labs       Lab Results  Component Value Date   HGBA1C 7.5 08/10/2017      #Severe acidosis/hyperkalemia Treated with dialysis and Lokelma     LOS: 12 Josaphine Shimamoto 1/22/202212:46 PM

## 2020-08-25 NOTE — Progress Notes (Signed)
ANTICOAGULATION CONSULT NOTE  Pharmacy Consult for heparin Indication: atrial fibrillation  Patient Measurements: Heparin Dosing Weight: 90 kg  Labs: Recent Labs    08/23/20 0540 08/23/20 0540 08/23/20 1154 08/24/20 0530 08/24/20 1452 08/25/20 0040 08/25/20 0304 08/25/20 0827  HGB  --    < > 9.2* 8.2*  --   --   --  7.0*  HCT  --   --  RESULTS UNAVAILABLE DUE TO INTERFERING SUBSTANCE 20.7*  --   --   --  RESULTS UNAVAILABLE DUE TO INTERFERING SUBSTANCE  PLT  --   --  144* 153  --   --   --  139*  HEPARINUNFRC 0.55  --   --  0.72* 0.12* 0.65  --  0.30  CREATININE 6.33*  --   --  5.23*  --   --  6.03*  --    < > = values in this interval not displayed.   Estimated Creatinine Clearance: 15.5 mL/min (A) (by C-G formula based on SCr of 6.03 mg/dL (H)).  Medical History: Past Medical History:  Diagnosis Date  . CAD (coronary artery disease)   . Cataract   . CHICKENPOX, HX OF 08/30/2008   Qualifier: Diagnosis of  By: [name], [name]    . Chronic kidney disease   . Diabetes mellitus   . Glaucoma   . History of urinary calculi 08/30/2008   Qualifier: Diagnosis of  By: [name], Jacelyn Pi   . Hyperlipidemia   . Hypertension   . Renal calculus or stone   . Skin cancer of nose    ears and hands   Assessment: 70 year old male admitted with severe DKA, which has since resolved and patient now on SQ insulin regimen. Patient with worsening renal function, HD started. Patient with new afib 1/13, started on amiodarone and heparin.   Date Time HL Rate/comment 1/15 0429 0.42 1200 units/hr - plts trending down 1/16 0302 0.28 1200 units/hr 1/16 1400 0.36 1300 units/hr 1/16 2226 0.33 1300 units/hr 1/17 0319 0.31 1300 units/hr - plts trending down 1/18 0400 0.27 1300 units/hr - plts improved, H/H slightly worse 1/18 1510 0.44 1400 units/hr - H/H trending down, plts stable 1/19 0032 0.38 1400 units/hr - therapeutic x 2 1/20 0540 0.55 1400 units/hr - therapeutic x 3 1/21 0530 0.72 1400  units/hr - slightly supratherapeutic 1/21 1452 0.12 1350 units/hr - subtherapeutic 1/22  0040 0.65 1550 units/hr - therapeutic 1/22 0827    0.30     1550 units/hr - therapeutic  Goal of Therapy:  Heparin level 0.3-0.7 units/ml Monitor platelets by anticoagulation protocol: Yes   Plan:   Anti-Xa level therapeutic for second consecutive draw: continue heparin infusion at 1550 units/hr  Recheck next anti-Xa level in am 1/23   CBC daily while on heparin drip   Dallie Piles, PharmD 08/25/2020,10:37 AM

## 2020-08-25 NOTE — Progress Notes (Signed)
Pt remained stable throughout entire shift, versed has been discontinued and he remains on fentanyl at 75 mcg/hr. Wound care was preformed on unstageable, deep tissue pressure ulcer, wound remains non-healing. He also remained afebrile all night, pt's vitals are stable at this time

## 2020-08-25 NOTE — Progress Notes (Signed)
NAME:  Nathaniel Thomas, MRN:  [id number], DOB:  [birth year], LOS: 58 ADMISSION DATE:  08/04/2020, CONSULTATION DATE:  08/23/2020 REFERRING MD:  Raliegh Ip CHIEF COMPLAINT:  Respiratory failure  Brief History:  70 y.o.malewith PMH as noted below who presents with altered mental status after he was found unresponsive this morning.   Per EMS he was initially hypoxic although this appears to have resolved. The patient himself is unable to give any history.  Patient Dx with COVID 1st week of Jan 2022 +NVD for several days ER shows severe acidosis with elevated sugars Patient with severe hypothermia and intubated for severe resp failure  Past Medical History:  He,  has a past medical history of CAD (coronary artery disease), Cataract, CHICKENPOX, HX OF (08/30/2008), Chronic kidney disease, Diabetes mellitus, Glaucoma, History of urinary calculi (08/30/2008), Hyperlipidemia, Hypertension, Renal calculus or stone, and Skin cancer of nose.   Significant Hospital Events:  1/10 admitted to ICU, severe DKA, gastroentertitis 1/11 severe resp failure 1/12 severe DKA 1/13 severe resp failure, worsening Renal failure 1/14- Reviewed care plan with cardiologist Dr Ubaldo Glassing, plan for cardiac cardoversion. Patient remains in shock with AFRVR on MV, medically optimizing for procedure.  I met with daughter York Cerise.   1/15- patient improved slightly, now with rate controlled AF off Neo. Discussed case with cardiology.  08/19/20- vitals are improved, rate controlled.  BP normalized.  Ventilator weaned to FiO2 60% remains crtically ill.   08/23/20 - vent weaned to 40% FiO2 and PEEP 5 08/25/20- patient remains critically ill, CBC with critially low Hb will repeat specimen and transfuse if needed today. Continuing renal replacement with severe electrolyte derragements and oliguric renal failure. Have updated POA Jaime today.   Consults:  Cardiology Nephrology  Procedures:  08/16/20 CVL 08/24/2020 CVL 08/28/2020  ETT  Significant Diagnostic Tests:  CT Head 08/14/2020 Negative motion degraded head CT.  Micro Data:  Tracheal Aspirate 08/18/20 - Serratia Marcescens   Antimicrobials:  Vancomycin 1/10 Cefepime 1/10   Interim History / Subjective:  No acute events overnight. This a.m. left lower extremity cold.  Dialysis planned not planned for today. Potassium 5.1  Objective   Blood pressure 131/65, pulse 86, temperature 99.14 F (37.3 C), resp. rate (!) 25, height 6' 3.98" (1.93 m), weight 110.7 kg, SpO2 99 %.    Vent Mode: PRVC FiO2 (%):  [40 %] 40 % Set Rate:  [22 bmp] 22 bmp Vt Set:  [520 mL] 520 mL PEEP:  [5 cmH20] 5 cmH20   Intake/Output Summary (Last 24 hours) at 08/25/2020 0810 Last data filed at 08/25/2020 0600 Gross per 24 hour  Intake 3175.86 ml  Output 400 ml  Net 2775.86 ml   Filed Weights   08/23/20 0500 08/24/20 0500 08/25/20 0359  Weight: 112.3 kg 110.4 kg 110.7 kg    Examination: Physical examination is limited due to need for PPE/CAPR General: chronically ill appearing male, sedated HENT: [place]/AT, ET tube in place, moist mucous membranes Lungs: Coarse breath sounds. No wheezing Cardiovascular: Monitor shows sinus abdomen: soft, mildly distended, BS+ Extremities: 2+ anasarca, both lower extremities mottled, right lower extremity cool to touch with weak pulses left lower extremity cold to touch, dopplerable pulses Neuro: sedated, not responsive GU: foley in place, massive scrotal edema   Scheduled Meds: . amiodarone  200 mg Per Tube BID  . chlorhexidine gluconate (MEDLINE KIT)  15 mL Mouth Rinse BID  . Chlorhexidine Gluconate Cloth  6 each Topical Daily  . docusate  100 mg Per Tube BID  .  feeding supplement (PROSource TF)  90 mL Per Tube BID  . hydrocortisone sod succinate (SOLU-CORTEF) inj  50 mg Intravenous Q24H  . insulin aspart  0-20 Units Subcutaneous Q4H  . insulin detemir  20 Units Subcutaneous BID  . mouth rinse  15 mL Mouth Rinse 10 times per day  .  metoprolol tartrate  12.5 mg Per NG tube BID  . multivitamin  1 tablet Per Tube QHS  . polyethylene glycol  17 g Per Tube Daily  . sodium chloride flush  10-40 mL Intracatheter Q12H  . sodium chloride flush  3 mL Intravenous Q12H  . sodium zirconium cyclosilicate  10 g Per Tube TID   Continuous Infusions: . sodium chloride    . ceFEPime (MAXIPIME) IV Stopped (08/24/20 1759)  . dexmedetomidine (PRECEDEX) IV infusion    . famotidine (PEPCID) IV Stopped (08/24/20 1145)  . feeding supplement (NEPRO CARB STEADY) 1,000 mL (08/21/20 1659)  . fentaNYL infusion INTRAVENOUS 75 mcg/hr (08/25/20 0400)  . heparin 1,550 Units/hr (08/25/20 0400)   PRN Meds:.sodium chloride, acetaminophen (TYLENOL) oral liquid 160 mg/5 mL, docusate, fentaNYL, heparin, metoprolol tartrate, midazolam, sodium chloride flush, sodium chloride flush   Resolved Hospital Problem list     Assessment & Plan:  Acute hypoxemic Respiratory failure due to Covid 19 Pneumonia - Continue mechanical ventilatory support, on 40% FiO2 and PEEP 5 - Will start to wean sedation despite issues with agonal breathing  - Serratia Marcesans pneumonia, treating with cefepime -Respiratory culture results from 1/18 confirmed Serratia sensitive to cefepime  Acute Kidney Injury, oliguric Hyperkalemia - Nephrology following - No  HD today - Continue lokelma 14m TID  Hypoglycemia -resolved Continue to monitor glucoses  Shock - he has been weaned off vasopressor support - Stress dose steroids to q 24 hours, continue to wean    Encephalopathy Suspect encephalopathy due to COVID-19 In setting of multiorgan failure - requiring high amounts of sedation - EEG shows severe diffuse encephalopathy without seizures - CT 1/10 negative but motion degradation present - CT head 1/20: Negative - Will need to wean off versed if possible in coming days  Atrial Fibrillation - continue amiodarone PO - on metoprolol 12.573mBID - Off diltiazem gtt -  heparin dripp  Type II Diabetes  DKA- resolved --Blood sugars stable --Resume SSI and levemir 20 units BID  --Monitor closely given recent hypoglycemic events  Leukocytosis Fever - check blood cultures - PICC line ordered and done today - Requiring Foley due to massive scrotal edema  Best practice (evaluated daily)  Diet: TF Pain/Anxiety/Delirium protocol (if indicated): fentanyl, precedex, versed VAP protocol (if indicated): ordered DVT prophylaxis: heparin GI prophylaxis: PPI  Glucose control: SSI Mobility: bed rest Disposition: ICU Code Status: Full code.  Spoke with daughter and wife on phone 08/22/20. All questions answered. Confirmed full code status for now.   Labs   CBC: Recent Labs  Lab 08/20/20 0319 08/21/20 0400 08/22/20 0352 08/23/20 1154 08/24/20 0530  WBC 17.4* 25.6* 25.3* 25.3* 22.3*  HGB 11.6* 10.0* 11.1* 9.2* 8.2*  HCT 31.7* 26.9* 29.6* RESULTS UNAVAILABLE DUE TO INTERFERING SUBSTANCE 20.7*  MCV 80.7 79.8* 78.7* RESULTS UNAVAILABLE DUE TO INTERFERING SUBSTANCE 75.5*  PLT 100* 112* 106* 144* 15269  Basic Metabolic Panel: Recent Labs  Lab 08/21/20 0400 08/21/20 1832 08/22/20 0352 08/22/20 1337 08/22/20 2030 08/23/20 0047 08/23/20 0540 08/24/20 0530 08/25/20 0304  NA 137   < > 136 132* 134* 132* 133* 132* 132*  K 6.3*   < > 6.1*  6.5* 6.3* 6.1* 6.2* 5.1 5.2*  CL 98   < > 95* 94* 93* 94* 94* 94* 92*  CO2 19*   < > 22 19* 21* 19* 20* 20* 20*  GLUCOSE 123*   < > 112* 303* 266* 252* 229* 138* 226*  BUN 178*   < > 144* 153* 167* 168* 183* 137* 166*  CREATININE 6.64*   < > 5.57* 5.91* 6.32* 6.15* 6.33* 5.23* 6.03*  CALCIUM 6.3*   < > 7.0* 7.1* 7.1* 7.3* 7.5* 7.3* 6.6*  MG 2.2  --  2.0  --   --   --  2.4 2.1 2.3  PHOS 9.2*  --  8.3* 9.8* 9.6*  --  10.1* 7.8* 10.0*   < > = values in this interval not displayed.   GFR: Estimated Creatinine Clearance: 15.5 mL/min (A) (by C-G formula based on SCr of 6.03 mg/dL (H)). Recent Labs  Lab  08/21/20 0400 08/22/20 0352 08/23/20 1154 08/24/20 0530  WBC 25.6* 25.3* 25.3* 22.3*    Liver Function Tests: Recent Labs  Lab 08/22/20 0352 08/22/20 1337 08/23/20 0540 08/24/20 0530 08/25/20 0304  AST 158*  --   --   --   --   ALT 166*  --   --   --   --   ALKPHOS 316*  --   --   --   --   BILITOT 1.1  --   --   --   --   PROT 5.3*  --   --   --   --   ALBUMIN 1.5*  1.5* 1.4* 1.4* 1.4* 1.3*   No results for input(s): LIPASE, AMYLASE in the last 168 hours. No results for input(s): AMMONIA in the last 168 hours.  ABG    Component Value Date/Time   PHART 7.39 08/23/2020 0830   PCO2ART 32 08/23/2020 0830   PO2ART 86 08/23/2020 0830   HCO3 19.4 (L) 08/23/2020 0830   ACIDBASEDEF 4.9 (H) 08/23/2020 0830   O2SAT 96.4 08/23/2020 0830     Coagulation Profile: No results for input(s): INR, PROTIME in the last 168 hours.  Cardiac Enzymes: No results for input(s): CKTOTAL, CKMB, CKMBINDEX, TROPONINI in the last 168 hours.  HbA1C: Hemoglobin A1C  Date/Time Value Ref Range Status  08/10/2017 11:53 AM 7.5  Final  04/09/2017 08:30 AM 7.3  Final   Hgb A1c MFr Bld  Date/Time Value Ref Range Status  01/11/2015 08:33 AM 7.5 (H) 4.6 - 6.5 % Final    Comment:    Glycemic Control Guidelines for People with Diabetes:Non Diabetic:  <6%Goal of Therapy: <7%Additional Action Suggested:  >8%   07/31/2014 03:27 PM 7.2 (H) 4.6 - 6.5 % Final    Comment:    Glycemic Control Guidelines for People with Diabetes:Non Diabetic:  <6%Goal of Therapy: <7%Additional Action Suggested:  >8%     CBG: Recent Labs  Lab 08/24/20 1708 08/24/20 2037 08/24/20 2355 08/25/20 0350 08/25/20 0734  GLUCAP 180* 162* 179* 177* 166*    Critical care time: 33 minutes    Critical care provider statement:    Critical care time (minutes):  33   Critical care time was exclusive of:  Separately billable procedures and  treating other patients   Critical care was necessary to treat or prevent imminent or   life-threatening deterioration of the following conditions:  acute hypoxemic respiratory failure, COVID19, AFrvr, multiple comorbid conditions.    Critical care was time spent personally by me on the following  activities:  Development of treatment plan with patient or surrogate,  discussions with consultants, evaluation of patient's response to  treatment, examination of patient, obtaining history from patient or  surrogate, ordering and performing treatments and interventions, ordering  and review of laboratory studies and re-evaluation of patient's condition   I assumed direction of critical care for this patient from another  provider in my specialty: no     *This note was dictated using voice recognition software/Dragon.  Despite best efforts to proofread, errors can occur which can change the meaning.  Any change was purely unintentional.  Ottie Glazier, M.D.  [facility] [place]

## 2020-08-25 NOTE — Plan of Care (Signed)

## 2020-08-25 NOTE — Progress Notes (Signed)
ANTICOAGULATION CONSULT NOTE  Pharmacy Consult for heparin Indication: atrial fibrillation  Patient Measurements: Heparin Dosing Weight: 90 kg  Labs: Recent Labs    08/22/20 0352 08/22/20 1337 08/23/20 0047 08/23/20 0540 08/23/20 1154 08/24/20 0530 08/24/20 1452 08/25/20 0040  HGB 11.1*  --   --   --  9.2* 8.2*  --   --   HCT 29.6*  --   --   --  RESULTS UNAVAILABLE DUE TO INTERFERING SUBSTANCE 20.7*  --   --   PLT 106*  --   --   --  144* 153  --   --   HEPARINUNFRC 0.34  --   --  0.55  --  0.72* 0.12* 0.65  CREATININE 5.57*   < > 6.15* 6.33*  --  5.23*  --   --    < > = values in this interval not displayed.   Estimated Creatinine Clearance: 17.9 mL/min (A) (by C-G formula based on SCr of 5.23 mg/dL (H)).  Medical History: Past Medical History:  Diagnosis Date  . CAD (coronary artery disease)   . Cataract   . CHICKENPOX, HX OF 08/30/2008   Qualifier: Diagnosis of  By: [name], [name]    . Chronic kidney disease   . Diabetes mellitus   . Glaucoma   . History of urinary calculi 08/30/2008   Qualifier: Diagnosis of  By: [name], Jacelyn Pi   . Hyperlipidemia   . Hypertension   . Renal calculus or stone   . Skin cancer of nose    ears and hands   Assessment: 70 year old male admitted with severe DKA, which has since resolved and patient now on SQ insulin regimen. Patient with worsening renal function, plan to start HD 1/13. Patient with new afib 1/13, started on amiodarone and heparin.   Date Time HL Rate/comment 1/15 0429 0.42 1200 units/hr - plts trending down 1/16 0302 0.28 1200 units/hr 1/16 1400 0.36 1300 units/hr 1/16 2226 0.33 1300 units/hr 1/17 0319 0.31 1300 units/hr - plts trending down 1/18 0400 0.27 1300 units/hr - plts improved, H/H slightly worse 1/18 1510 0.44 1400 units/hr - H/H trending down, plts stable 1/19 0032 0.38 1400 units/hr - therapeutic x 2 1/20 0540 0.55 1400 units/hr - therapeutic x 3 1/21 0530 0.72 1400 units/hr - slightly  supratherapeutic 1/21 1452 0.12 1350 units/hr - subtherapeutic 1/22  0040 0.65 1550 units/hr - therapeutic  Goal of Therapy:  Heparin level 0.3-0.7 units/ml Monitor platelets by anticoagulation protocol: Yes   Plan:  Continue Heparin drip at 1550 units/hr. Recheck HL in ~ 8 hrs to confirm.  CBC daily while on heparin drip.   Ena Dawley, PharmD 08/25/2020,1:27 AM

## 2020-08-26 ENCOUNTER — Inpatient Hospital Stay: Payer: HMO

## 2020-08-26 DIAGNOSIS — E872 Acidosis: Secondary | ICD-10-CM | POA: Diagnosis not present

## 2020-08-26 DIAGNOSIS — N170 Acute kidney failure with tubular necrosis: Secondary | ICD-10-CM | POA: Diagnosis not present

## 2020-08-26 DIAGNOSIS — J9601 Acute respiratory failure with hypoxia: Secondary | ICD-10-CM | POA: Diagnosis not present

## 2020-08-26 DIAGNOSIS — U071 COVID-19: Secondary | ICD-10-CM | POA: Diagnosis not present

## 2020-08-26 LAB — GLUCOSE, CAPILLARY
Glucose-Capillary: 238 mg/dL — ABNORMAL HIGH (ref 70–99)
Glucose-Capillary: 193 mg/dL — ABNORMAL HIGH (ref 70–99)
Glucose-Capillary: 151 mg/dL — ABNORMAL HIGH (ref 70–99)
Glucose-Capillary: 127 mg/dL — ABNORMAL HIGH (ref 70–99)
Glucose-Capillary: 115 mg/dL — ABNORMAL HIGH (ref 70–99)
Glucose-Capillary: 90 mg/dL (ref 70–99)

## 2020-08-26 LAB — CBC
HCT: 18.8 % — ABNORMAL LOW (ref 39.0–52.0)
Hemoglobin: 6.9 g/dL — ABNORMAL LOW (ref 13.0–17.0)
MCH: 30.5 pg (ref 26.0–34.0)
MCHC: 36.7 g/dL — ABNORMAL HIGH (ref 30.0–36.0)
MCV: 83.2 fL (ref 80.0–100.0)
Platelets: 164 10*3/uL (ref 150–400)
RBC: 2.26 MIL/uL — ABNORMAL LOW (ref 4.22–5.81)
RDW: 18.8 % — ABNORMAL HIGH (ref 11.5–15.5)
WBC: 19.2 10*3/uL — ABNORMAL HIGH (ref 4.0–10.5)
nRBC: 0 % (ref 0.0–0.2)

## 2020-08-26 LAB — RENAL FUNCTION PANEL
Albumin: 1.2 g/dL — ABNORMAL LOW (ref 3.5–5.0)
Anion gap: 21 — ABNORMAL HIGH (ref 5–15)
BUN: 206 mg/dL — ABNORMAL HIGH (ref 8–23)
CO2: 17 mmol/L — ABNORMAL LOW (ref 22–32)
Calcium: 6.2 mg/dL — CL (ref 8.9–10.3)
Chloride: 93 mmol/L — ABNORMAL LOW (ref 98–111)
Creatinine, Ser: 6.94 mg/dL — ABNORMAL HIGH (ref 0.61–1.24)
GFR, Estimated: 8 mL/min — ABNORMAL LOW (ref >60)
Glucose, Bld: 223 mg/dL — ABNORMAL HIGH (ref 70–99)
Phosphorus: 11.9 mg/dL — ABNORMAL HIGH (ref 2.5–4.6)
Potassium: 5 mmol/L (ref 3.5–5.1)
Sodium: 131 mmol/L — ABNORMAL LOW (ref 135–145)

## 2020-08-26 LAB — PHOSPHORUS: Phosphorus: 11 mg/dL — ABNORMAL HIGH (ref 2.5–4.6)

## 2020-08-26 LAB — HEPARIN LEVEL (UNFRACTIONATED)
Heparin Unfractionated: 0.25 IU/mL — ABNORMAL LOW (ref 0.30–0.70)
Heparin Unfractionated: 0.27 IU/mL — ABNORMAL LOW (ref 0.30–0.70)

## 2020-08-26 LAB — MAGNESIUM: Magnesium: 2.7 mg/dL — ABNORMAL HIGH (ref 1.7–2.4)

## 2020-08-26 IMAGING — MR MR HEAD W/O CM
9 of 11 series · 34 of 48 positions shown · non-contrast
Comparison: Prior CT from [DATE].

CLINICAL DATA: Initial evaluation for anoxic brain injury.

EXAM:
MRI HEAD WITHOUT CONTRAST
TECHNIQUE: Multiplanar, multiecho pulse sequences of the brain and surrounding
structures were obtained without intravenous contrast.

[Series 5: ax dwi_tracew · axial · 3.0mm · 0.60mm/px · z∈[-8,+137]mm · 5 of 48 slices shown]
[im 1/48]
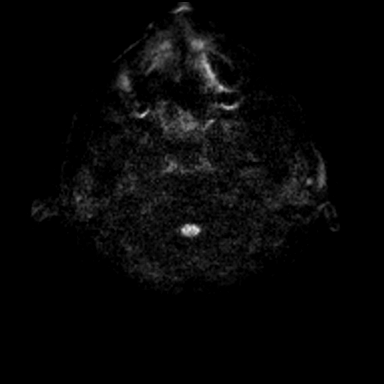
[im 12/48]
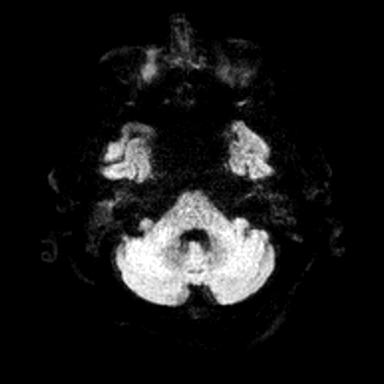
[im 24/48]
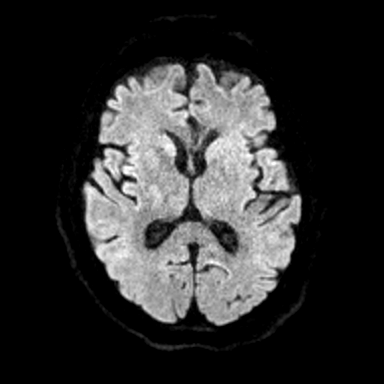
[im 36/48]
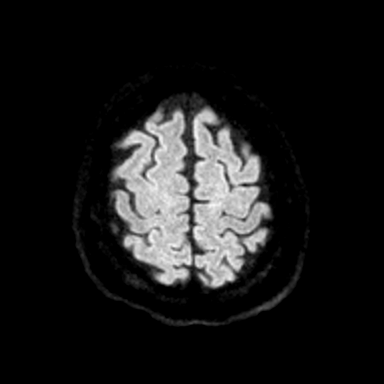
[im 48/48]
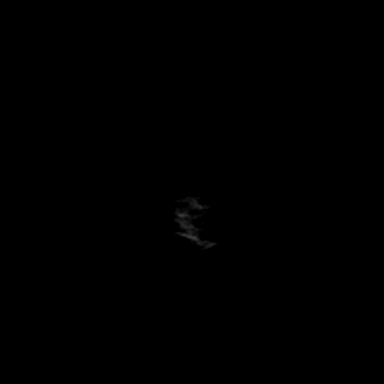

[Series 6: ax dwi_adc · axial · 3.0mm · 0.60mm/px · z∈[-8,+137]mm · 4 of 48 slices shown]
[im 1/48]
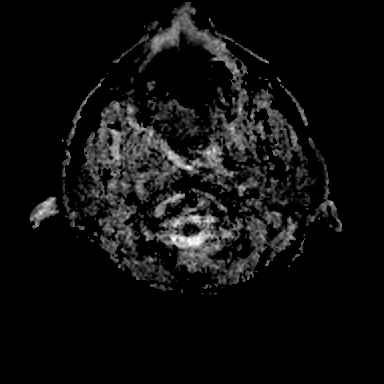
[im 16/48]
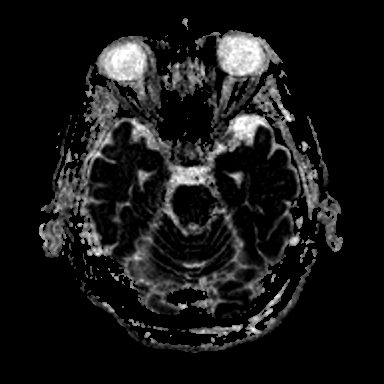
[im 32/48]
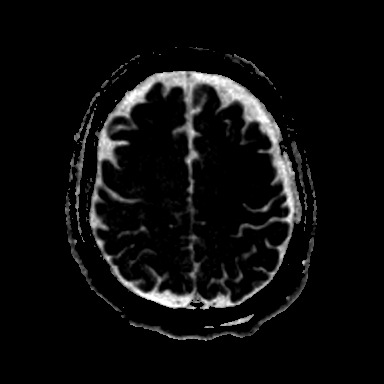
[im 48/48]
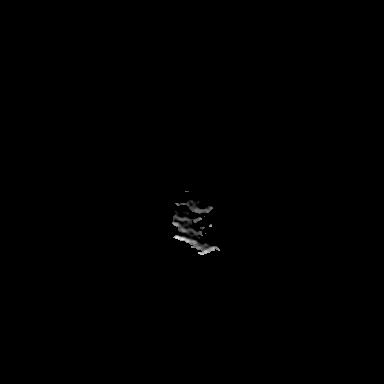

[Series 7: cor dwi_tracew · coronal · 5.0mm · 0.60mm/px · 3 of 38 slices shown]
[im 1/38]
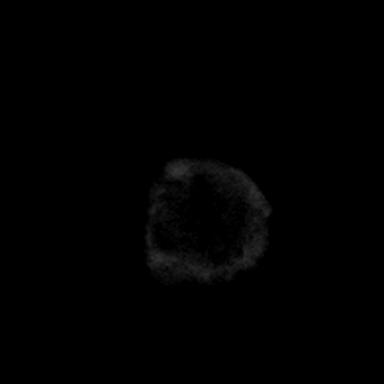
[im 19/38]
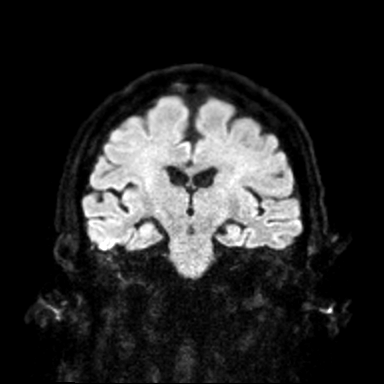
[im 38/38]
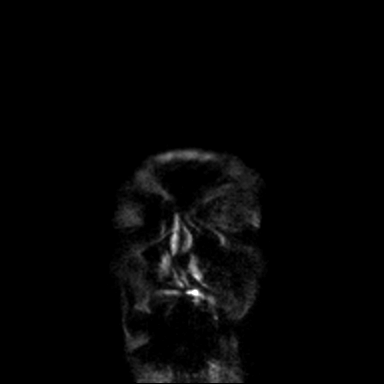

[Series 8: cor dwi_adc · coronal · 5.0mm · 0.60mm/px · 2 of 38 slices shown]
[im 1/38]
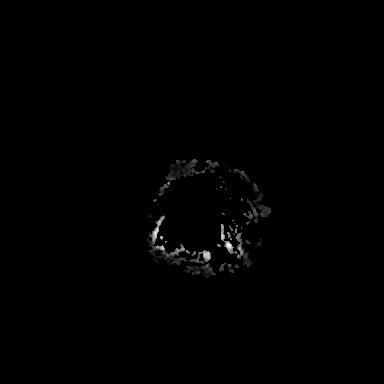
[im 19/38]
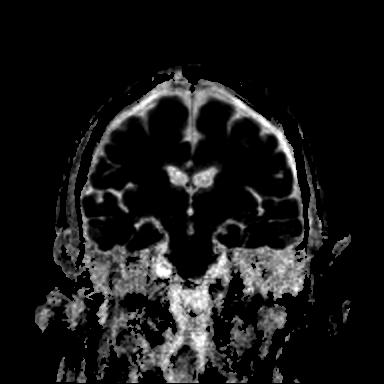

[Series 9: T1 · sagittal · 5.0mm · 0.62mm/px · 2 of 23 slices shown (1 of 2)]
[im 1/23]
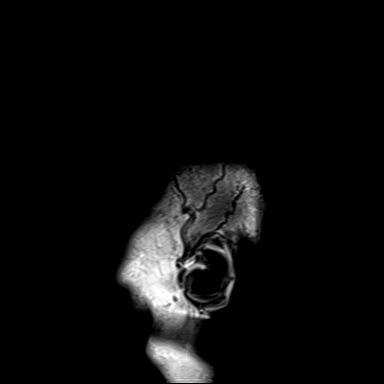
[im 23/23]
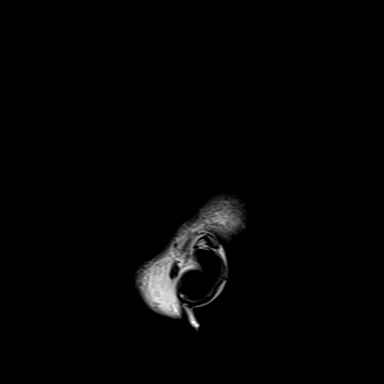

[Series 10: T2 · axial · 5.0mm · 0.45mm/px · z∈[-7,+139]mm · 2 of 27 slices shown (1 of 2)]
[im 1/27]
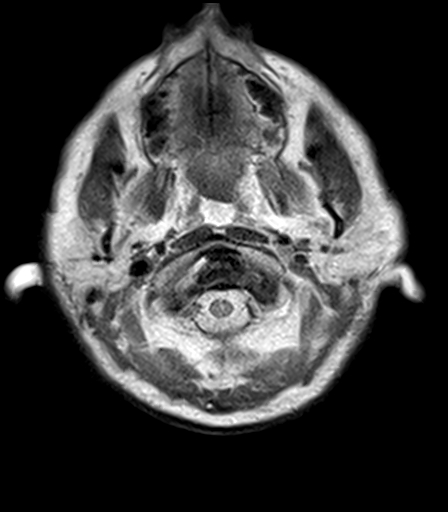
[im 27/27]
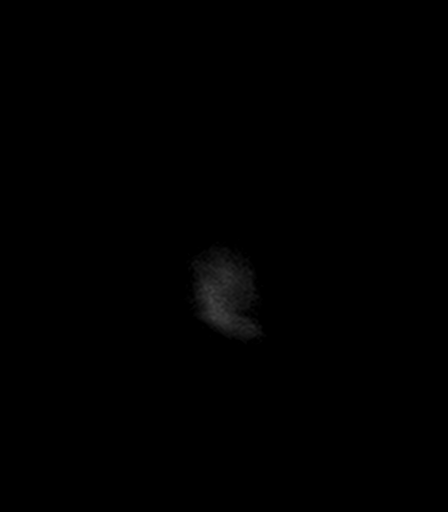

[Series 15: FLAIR · axial · 3.0mm · 0.53mm/px · z∈[-12,+139]mm · 5 of 55 slices shown]
[im 1/55]
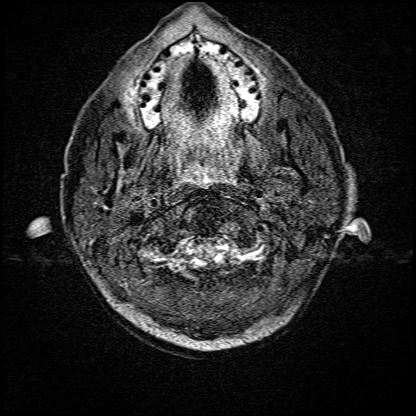
[im 14/55]
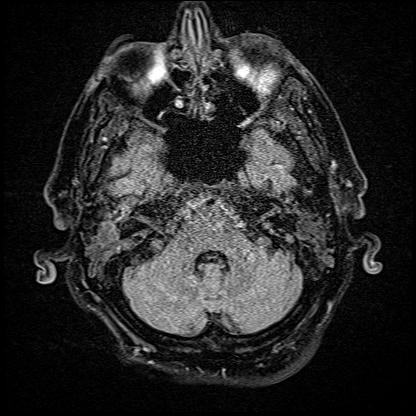
[im 28/55]
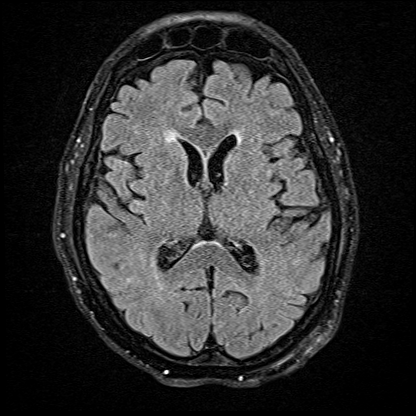
[im 41/55]
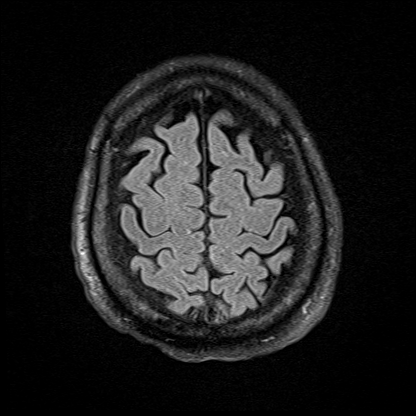
[im 55/55]
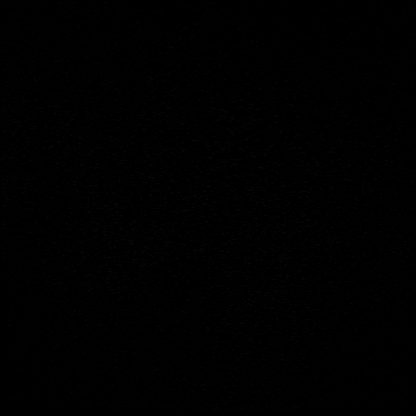

[Series 16: T1 · axial · 1.0mm · 0.98mm/px · z∈[-6,+143]mm · 8 of 160 slices shown (2 of 2)]
[im 1/160]
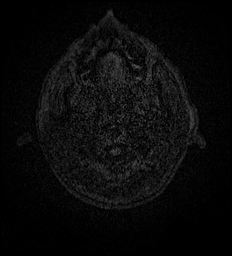
[im 27/160]
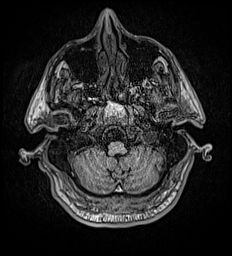
[im 54/160]
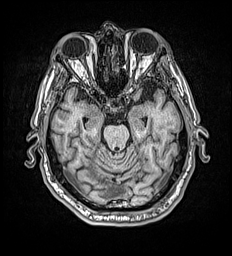
[im 67/160]
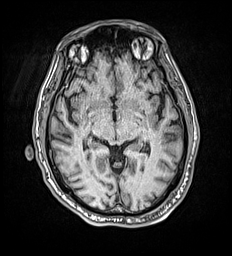
[im 93/160]
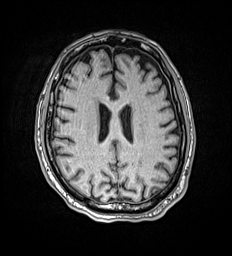
[im 107/160]
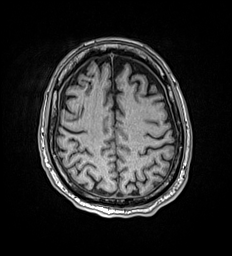
[im 133/160]
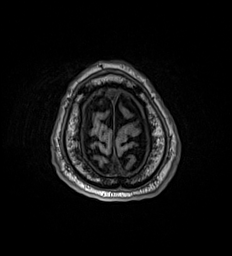
[im 160/160]
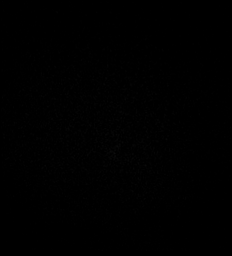

[Series 17: T2 · coronal · 5.0mm · 0.45mm/px · 3 of 30 slices shown (2 of 2)]
[im 1/30]
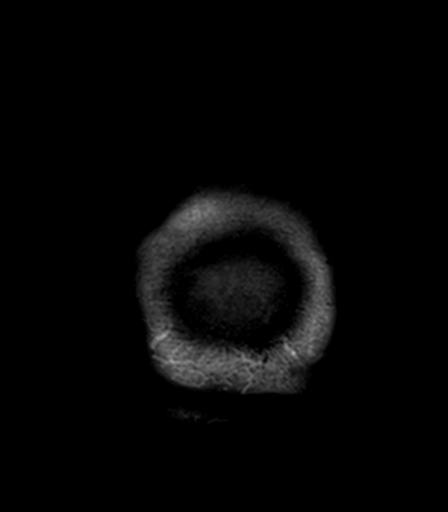
[im 15/30]
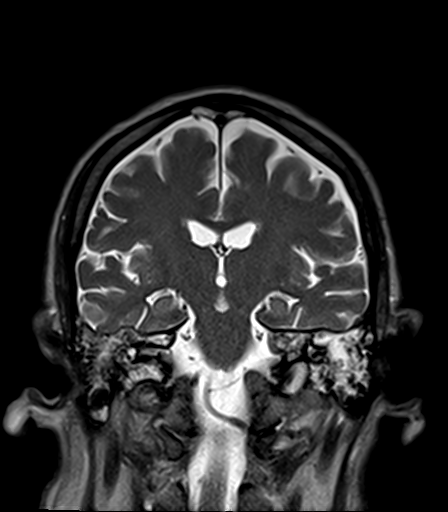
[im 30/30]
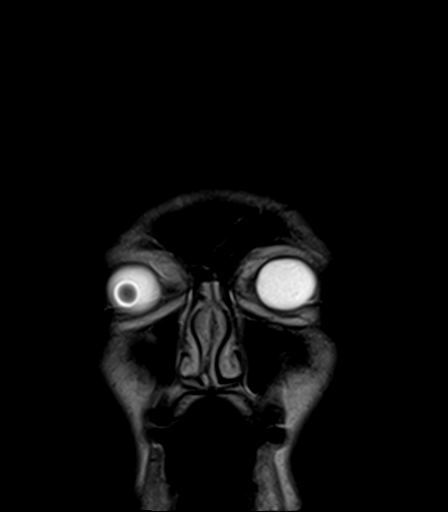

[34 of 48 positions shown; findings below may reference images not displayed]

FINDINGS: Brain: Cerebral volume within normal limits for age. Mild chronic
microvascular ischemic disease noted involving the periventricular
and deep white matter both cerebral hemispheres.

No abnormal foci of restricted diffusion to suggest acute or
subacute ischemia. Gray-white matter differentiation maintained. No
signal changes to suggest anoxic brain injury. No encephalomalacia
to suggest chronic cortical infarction. No acute intracranial
hemorrhage. Innumerable scattered chronic micro hemorrhages seen
involving the bilateral cerebral and cerebellar hemispheres,
predominantly peripheral in location, and most suggestive of
underlying cerebral amyloid angiopathy.

No mass lesion, midline shift or mass effect. No hydrocephalus or
extra-axial fluid collection. Pituitary gland suprasellar region
normal. Midline structures intact.

Vascular: Major intracranial vascular flow voids are maintained.

Skull and upper cervical spine: Craniocervical junction within
normal limits. Bone marrow signal intensity normal. No scalp soft
tissue abnormality.

Sinuses/Orbits: Globes and orbital soft tissues demonstrate no acute
finding. Patient status post ocular lens replacement on the left.
Paranasal sinuses are largely clear. Layering fluid seen within the
nasopharynx. Large bilateral mastoid effusions noted. Patient is
intubated.

Other: None.
IMPRESSION: 1. No acute intracranial abnormality. No evidence for anoxic brain
injury.
2. Innumerable scattered chronic micro hemorrhages involving the
bilateral cerebral and cerebellar hemispheres, predominantly
peripheral in location, and most suggestive of underlying cerebral
amyloid angiopathy.
3. Mild chronic microvascular ischemic disease.

## 2020-08-26 NOTE — Progress Notes (Signed)
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 Patient has been on a heparin drip for Atrial Fibrillation this admission. Currently converted into NSR on amiodarone BID per tube.  Discussed case with Dr. Emmit Alexanders, will hold heparin drip overnight since currently in NSR. Will monitor patient closely.  Care RN updated will current plan, pharmacy notified.   Domingo Pulse [place], AGACNP-BC Acute Care Nurse Practitioner [facility]   [phone] / [phone] Please see Amion for pager details.

## 2020-08-26 NOTE — Progress Notes (Signed)
[facility]  ROUNDING NOTE   Subjective:   [name] admitted to [facility] on 08/09/2692 for Metabolic acidosis [W54.6] Encounter for central line placement [Z45.2] Acute renal failure (ARF) ([place]) [N17.9] Acute respiratory failure with hypoxia ([place]) [J96.01] Sepsis, due to unspecified organism, unspecified whether acute organ dysfunction present ([place]) [A41.9] COVID-19 [U07.1]   Patient presented to the ER via EMS from home for altered mental status.  Recently treated for COVID. History taken from daughter on the phone and wife at bedside. Patient is unvaccinated.   Started on dialysis on 1/13. Intermittent treatments on 1/13, 1/15, 1/18, 1/20 and today 1/23  Seen and examined on hemodialysis treatment. Tolerating treatment well. 2 hour treatment  Wife at bedside. UOP 160m  Objective:  Vital signs in last 24 hours:  Temp:  [98.6 F (37 C)-100.8 F (38.2 C)] 98.8 F (37.1 C) (01/23 1200) Pulse Rate:  [59-91] 88 (01/23 1200) Resp:  [24-36] 27 (01/23 1200) BP: (100-137)/(52-68) 108/58 (01/23 1200) SpO2:  [61 %-100 %] 96 % (01/23 1200) FiO2 (%):  [40 %] 40 % (01/23 1007) Weight:  [111.5 kg] 111.5 kg (01/23 0232)  Weight change: 0.8 kg Filed Weights   08/24/20 0500 08/25/20 0359 08/26/20 0232  Weight: 110.4 kg 110.7 kg 111.5 kg    Intake/Output: I/O last 3 completed shifts: In: 4601.1 [I.V.:2441.2; Other:25; NG/GT:1640; IV Piggyback:494.9] Out: 275 [Urine:275]   Intake/Output this shift:  Total I/O In: 182.2 [I.V.:132.2; IV Piggyback:50] Out: -   Physical Exam: General: Critically ill   Head: ETT  Eyes: Eyes closed  Neck: trachea midline  Lungs:  PRVC FiO2 40%  Heart: Regular rate and rhythm  Abdomen:  Soft, +bowel sounds  Extremities:  + peripheral edema.  Neurologic: Intubated and sedate  Skin: No lesions  Access: Right femoral temp HD catheter 12/70   Basic Metabolic Panel: Recent Labs  Lab 08/22/20 0352 08/22/20 1337 08/22/20 2030  08/23/20 0047 08/23/20 0540 08/24/20 0530 08/25/20 0304 08/26/20 0433  NA 136   < > 134* 132* 133* 132* 132* 131*  K 6.1*   < > 6.3* 6.1* 6.2* 5.1 5.2* 5.0  CL 95*   < > 93* 94* 94* 94* 92* 93*  CO2 22   < > 21* 19* 20* 20* 20* 17*  GLUCOSE 112*   < > 266* 252* 229* 138* 226* 223*  BUN 144*   < > 167* 168* 183* 137* 166* 206*  CREATININE 5.57*   < > 6.32* 6.15* 6.33* 5.23* 6.03* 6.94*  CALCIUM 7.0*   < > 7.1* 7.3* 7.5* 7.3* 6.6* 6.2*  MG 2.0  --   --   --  2.4 2.1 2.3 2.7*  PHOS 8.3*   < > 9.6*  --  10.1* 7.8* 9.4*  10.0* 11.0*  11.9*   < > = values in this interval not displayed.    Liver Function Tests: Recent Labs  Lab 08/22/20 0352 08/22/20 1337 08/23/20 0540 08/24/20 0530 08/25/20 0304 08/26/20 0433  AST 158*  --   --   --   --   --   ALT 166*  --   --   --   --   --   ALKPHOS 316*  --   --   --   --   --   BILITOT 1.1  --   --   --   --   --   PROT 5.3*  --   --   --   --   --  ALBUMIN 1.5*  1.5* 1.4* 1.4* 1.4* 1.3* 1.2*   No results for input(s): LIPASE, AMYLASE in the last 168 hours. No results for input(s): AMMONIA in the last 168 hours.  CBC: Recent Labs  Lab 08/23/20 1154 08/24/20 0530 08/25/20 0827 08/25/20 1217 08/26/20 0433  WBC 25.3* 22.3* 22.2* 24.3* 19.2*  NEUTROABS  --   --   --  23.0*  --   HGB 9.2* 8.2* 7.0* 7.2* 6.9*  HCT RESULTS UNAVAILABLE DUE TO INTERFERING SUBSTANCE 20.7* RESULTS UNAVAILABLE DUE TO INTERFERING SUBSTANCE 19.4* 18.8*  MCV RESULTS UNAVAILABLE DUE TO INTERFERING SUBSTANCE 75.5* RESULTS UNAVAILABLE DUE TO INTERFERING SUBSTANCE 81.5 83.2  PLT 144* 153 139* 160 164    Cardiac Enzymes: No results for input(s): CKTOTAL, CKMB, CKMBINDEX, TROPONINI in the last 168 hours.  BNP: Invalid input(s): POCBNP  CBG: Recent Labs  Lab 08/25/20 1928 08/25/20 2335 08/26/20 0313 08/26/20 0744 08/26/20 1127  GLUCAP 187* 216* 238* 193* 151*    Microbiology: Results for orders placed or performed during the hospital encounter  of 08/28/2020  Blood Culture (routine x 2)     Status: None   Collection Time: 08/23/2020  9:50 AM   Specimen: BLOOD  Result Value Ref Range Status   Specimen Description BLOOD LEFT [facility]  Final   Special Requests   Final    BOTTLES DRAWN AEROBIC AND ANAEROBIC Blood Culture adequate volume   Culture   Final    NO GROWTH 5 DAYS Performed at [facility], [address] [place], [place] [address]    Report Status 08/18/2020 FINAL  Final  Blood Culture (routine x 2)     Status: None   Collection Time: 08/08/2020  9:50 AM   Specimen: BLOOD  Result Value Ref Range Status   Specimen Description BLOOD RIGHT AC  Final   Special Requests   Final    BOTTLES DRAWN AEROBIC AND ANAEROBIC Blood Culture results may not be optimal due to an excessive volume of blood received in culture bottles   Culture   Final    NO GROWTH 5 DAYS Performed at [facility], [address] [place], [place] [address]    Report Status 08/18/2020 FINAL  Final  Urine culture     Status: None   Collection Time: 08/14/2020 11:36 AM   Specimen: Urine, Random  Result Value Ref Range Status   Specimen Description   Final    URINE, RANDOM Performed at [facility], [address], [place], [place] [address]    Special Requests   Final    NONE Performed at [facility], [address], [place], [place] [address]    Culture   Final    NO GROWTH Performed at [facility], [place] [address] [place], [place] [address]    Report Status 08/14/2020 FINAL  Final  Culture, respiratory     Status: None   Collection Time: 08/17/20  3:15 PM   Specimen: Tracheal Aspirate; Respiratory  Result Value Ref Range Status   Specimen Description   Final    TRACHEAL ASPIRATE Performed at [facility], [address] [place], [place] [address]    Special Requests   Final    NONE Performed at [facility], [place], [place] [address]    Gram Stain NO WBC  SEEN MODERATE GRAM VARIABLE ROD   Final   Culture   Final    ABUNDANT SERRATIA MARCESCENS SUSCEPTIBILITIES PERFORMED ON PREVIOUS CULTURE WITHIN THE  LAST 5 DAYS. Performed at [facility], [place] [address] [place], [place] [address]    Report Status 08/20/2020 FINAL  Final  Culture, respiratory     Status: None   Collection Time: 08/18/20 11:09 AM   Specimen: Tracheal Aspirate; Respiratory  Result Value Ref Range Status   Specimen Description   Final    TRACHEAL ASPIRATE Performed at [facility], [address] [place], [place] [address]    Special Requests   Final    NONE Performed at [facility], [place], [place] [address]    Gram Stain   Final    MODERATE WBC PRESENT, PREDOMINANTLY PMN MODERATE GRAM NEGATIVE RODS FEW GRAM VARIABLE ROD RARE GRAM POSITIVE COCCI Performed at [facility], [place] [address], [place], [place] [address]    Culture ABUNDANT SERRATIA MARCESCENS  Final   Report Status 08/20/2020 FINAL  Final   Organism ID, Bacteria SERRATIA MARCESCENS  Final      Susceptibility   Serratia marcescens - MIC*    CEFAZOLIN >=64 RESISTANT Resistant     CEFEPIME <=0.12 SENSITIVE Sensitive     CEFTAZIDIME <=1 SENSITIVE Sensitive     CEFTRIAXONE <=0.25 SENSITIVE Sensitive     CIPROFLOXACIN <=0.25 SENSITIVE Sensitive     GENTAMICIN <=1 SENSITIVE Sensitive     TRIMETH/SULFA <=20 SENSITIVE Sensitive     * ABUNDANT SERRATIA MARCESCENS  MRSA PCR Screening     Status: None   Collection Time: 08/18/20 12:52 PM   Specimen: Nasopharyngeal  Result Value Ref Range Status   MRSA by PCR NEGATIVE NEGATIVE Final    Comment:        The GeneXpert MRSA Assay (FDA approved for NASAL specimens only), is one component of a comprehensive MRSA colonization surveillance program. It is not intended to diagnose MRSA infection nor to guide or monitor treatment for MRSA infections. Performed at [facility], [address]  [place], [place] [address]   Culture, respiratory (non-expectorated)     Status: None   Collection Time: 08/21/20  4:52 PM   Specimen: Tracheal Aspirate; Respiratory  Result Value Ref Range Status   Specimen Description   Final    TRACHEAL ASPIRATE Performed at [facility], [address] [place], [place] [address]    Special Requests   Final    NONE Performed at [facility], [address] [place],  [address]    Gram Stain   Final    FEW WBC PRESENT, PREDOMINANTLY PMN ABUNDANT GRAM VARIABLE ROD MODERATE GRAM POSITIVE COCCI Performed at [facility], [place] [address], [place],  [address]    Culture ABUNDANT SERRATIA MARCESCENS  Final   Report Status 08/24/2020 FINAL  Final   Organism ID, Bacteria SERRATIA MARCESCENS  Final      Susceptibility   Serratia marcescens - MIC*    CEFAZOLIN >=64 RESISTANT Resistant     CEFEPIME <=0.12 SENSITIVE Sensitive     CEFTAZIDIME <=1 SENSITIVE Sensitive     CEFTRIAXONE <=0.25 SENSITIVE Sensitive     CIPROFLOXACIN <=0.25 SENSITIVE Sensitive     GENTAMICIN <=1 SENSITIVE Sensitive     TRIMETH/SULFA <=20 SENSITIVE Sensitive     * ABUNDANT SERRATIA MARCESCENS  CULTURE, BLOOD (ROUTINE X 2) w Reflex to ID Panel     Status: None (Preliminary result)   Collection Time: 08/22/20  1:37 PM   Specimen: BLOOD  Result Value Ref Range Status   Specimen Description BLOOD BLOOD LEFT  HAND  Final   Special Requests   Final    BOTTLES DRAWN AEROBIC AND ANAEROBIC Blood Culture adequate volume   Culture   Final    NO GROWTH 4 DAYS Performed at [facility], [address] [place], [place] [address]    Report Status PENDING  Incomplete  CULTURE, BLOOD (ROUTINE X 2) w Reflex to ID Panel     Status: None (Preliminary result)   Collection Time: 08/22/20  1:37 PM   Specimen: BLOOD  Result Value Ref Range Status   Specimen Description BLOOD LEFT ANTECUBITAL  Final   Special Requests   Final    BOTTLES DRAWN AEROBIC  AND ANAEROBIC Blood Culture adequate volume   Culture   Final    NO GROWTH 4 DAYS Performed at [facility], [address] [place],  [address]    Report Status PENDING  Incomplete    Coagulation Studies: No results for input(s): LABPROT, INR in the last 72 hours.  Urinalysis: No results for input(s): COLORURINE, LABSPEC, PHURINE, GLUCOSEU, HGBUR, BILIRUBINUR, KETONESUR, PROTEINUR, UROBILINOGEN, NITRITE, LEUKOCYTESUR in the last 72 hours.  Invalid input(s): APPERANCEUR    Imaging: EEG  Result Date: 08/24/2020 Lora Havens, MD     08/24/2020  2:11 PM Patient Name: ASHLEE BEWLEY MRN: [id number] Epilepsy Attending: Lora Havens Referring Physician/Provider: Dr. Freda Jackson Date: 08/24/2020 Duration: 21.04 mins Patient history: 70 year old male with altered mental status.  EEG to evaluate for seizures. Level of alertness: Comatose AEDs during EEG study: Versed Technical aspects: This EEG study was done with scalp electrodes positioned according to the 10-20 International system of electrode placement. Electrical activity was acquired at a sampling rate of 500Hz  and reviewed with a high frequency filter of 70Hz  and a low frequency filter of 1Hz . EEG data were recorded continuously and digitally stored. Description: EEG showed continuous generalized 3 to 5 Hz theta-delta slowing.  Of note, this was a technically difficult study due to significant sweat artifact. Hyperventilation and photic stimulation were not performed.   ABNORMALITY -Continuous slow, generalized IMPRESSION: This technically difficult study is suggestive of severe diffuse encephalopathy, nonspecific etiology.  No seizures or epileptiform discharges were seen throughout the recording. If concern for ictal-interictal activity persists, please consider repeat study. Lora Havens   DG Chest Port 1 View  Result Date: 08/25/2020 CLINICAL DATA:  COVID-19.  Mental status change. EXAM: PORTABLE CHEST 1 VIEW  COMPARISON:  Chest radiograph 08/22/2020 FINDINGS: ET tube mid trachea. Enteric tube courses inferior to the diaphragm. Central venous catheter tip projects over the superior vena cava. Right upper extremity PICC line tip projects over the superior vena cava. Stable cardiac and mediastinal contours. Elevation right hemidiaphragm. Similar diffuse bilateral airspace opacities. No pleural effusion or pneumothorax. IMPRESSION: Support apparatus as above. Similar-appearing diffuse bilateral airspace opacities. Electronically Signed   By: Lovey Newcomer M.D.   On: 08/25/2020 13:31     Medications:   . sodium chloride    . ceFEPime (MAXIPIME) IV Stopped (08/25/20 1742)  . famotidine (PEPCID) IV Stopped (08/26/20 1022)  . feeding supplement (NEPRO CARB STEADY) 1,000 mL (08/21/20 1659)  . fentaNYL infusion INTRAVENOUS Stopped (08/25/20 0846)  . heparin 1,650 Units/hr (08/26/20 1100)   . amiodarone  200 mg Per Tube BID  . chlorhexidine gluconate (MEDLINE KIT)  15 mL Mouth Rinse BID  . Chlorhexidine Gluconate Cloth  6 each Topical Daily  . docusate  100 mg Per Tube BID  . feeding supplement (PROSource TF)  90  mL Per Tube BID  . insulin aspart  0-20 Units Subcutaneous Q4H  . insulin detemir  20 Units Subcutaneous BID  . mouth rinse  15 mL Mouth Rinse 10 times per day  . metoprolol tartrate  12.5 mg Per NG tube BID  . multivitamin  1 tablet Per Tube QHS  . polyethylene glycol  17 g Per Tube Daily  . sodium chloride flush  10-40 mL Intracatheter Q12H  . sodium chloride flush  3 mL Intravenous Q12H  . sodium zirconium cyclosilicate  10 g Per Tube TID   sodium chloride, acetaminophen (TYLENOL) oral liquid 160 mg/5 mL, docusate, fentaNYL, heparin, metoprolol tartrate, midazolam, sodium chloride flush, sodium chloride flush  Assessment/ Plan:  [name] is a 71 y.o. white male with coronary artery disease, diabetes, glaucoma, history of kidney stones, hyperlipidemia, hypertension, left knee  replacement, lithotripsy, basal cell carcinoma, was admitted on 01/07/6371 with Metabolic acidosis [L42.6] Encounter for central line placement [Z45.2] Acute renal failure (ARF) ([place]) [N17.9] Acute respiratory failure with hypoxia ([place]) [J96.01] Sepsis, due to unspecified organism, unspecified whether acute organ dysfunction present ([place]) [A41.9] COVID-19 [U07.1]   #Acute kidney injury Baseline creatinine 1.0 from August 02, 2020 Presenting creatinine of 2.15 Recent Labs       Lab Results  Component Value Date   CREATININE 5.23 (H) 08/24/2020   CREATININE 6.33 (H) 08/23/2020   CREATININE 6.15 (H) 08/23/2020     Seen and examined on hemodialysis. Monitor daily for dialysis need.   #Acute respiratory failure Requiring ventilator support.  Intubated 08/14/2020  #COVID-19 pneumonia Treated with ivermectin as outpatient  #Diabetes type 2, insulin dependent: DKA on admission Outpatient regimen of metformin, Jardiance (empagliflozin), Amaryl as outpatient Hemoglobin A1c 7.9% from June 26, 2020 Recent Labs       Lab Results  Component Value Date   HGBA1C 7.5 27/00/4849      #Metabolic acidosis/hyperkalemia Treated with dialysis and Lokelma   Overall prognosis is poor. Discussed with wife and daughter over the phone.    LOS: [place] 1/23/202212:41 PM

## 2020-08-26 NOTE — Progress Notes (Signed)
ANTICOAGULATION CONSULT NOTE  Pharmacy Consult for heparin Indication: atrial fibrillation  Patient Measurements: Heparin Dosing Weight: 90 kg  Labs: Recent Labs    08/24/20 0530 08/24/20 1452 08/25/20 0040 08/25/20 0304 08/25/20 0827 08/25/20 1217 08/26/20 0433  HGB 8.2*  --   --   --  7.0* 7.2* 6.9*  HCT 20.7*  --   --   --  RESULTS UNAVAILABLE DUE TO INTERFERING SUBSTANCE 19.4* 18.8*  PLT 153  --   --   --  139* 160 164  HEPARINUNFRC 0.72*   < > 0.65  --  0.30  --  0.25*  CREATININE 5.23*  --   --  6.03*  --   --   --    < > = values in this interval not displayed.   Estimated Creatinine Clearance: 15.6 mL/min (A) (by C-G formula based on SCr of 6.03 mg/dL (H)).  Medical History: Past Medical History:  Diagnosis Date  . CAD (coronary artery disease)   . Cataract   . CHICKENPOX, HX OF 08/30/2008   Qualifier: Diagnosis of  By: [name], [name]    . Chronic kidney disease   . Diabetes mellitus   . Glaucoma   . History of urinary calculi 08/30/2008   Qualifier: Diagnosis of  By: [name], Jacelyn Pi   . Hyperlipidemia   . Hypertension   . Renal calculus or stone   . Skin cancer of nose    ears and hands   Assessment: 70 year old male admitted with severe DKA, which has since resolved and patient now on SQ insulin regimen. Patient with worsening renal function, HD started. Patient with new afib 1/13, started on amiodarone and heparin.   Date Time HL Rate/comment 1/15 0429 0.42 1200 units/hr - plts trending down 1/16 0302 0.28 1200 units/hr 1/16 1400 0.36 1300 units/hr 1/16 2226 0.33 1300 units/hr 1/17 0319 0.31 1300 units/hr - plts trending down 1/18 0400 0.27 1300 units/hr - plts improved, H/H slightly worse 1/18 1510 0.44 1400 units/hr - H/H trending down, plts stable 1/19 0032 0.38 1400 units/hr - therapeutic x 2 1/20 0540 0.55 1400 units/hr - therapeutic x 3 1/21 0530 0.72 1400 units/hr - slightly supratherapeutic 1/21 1452 0.12 1350 units/hr -  subtherapeutic 1/22  0040 0.65 1550 units/hr - therapeutic 1/22 0827    0.30     1550 units/hr - therapeutic 1/23 0433 0.25 1550 units/hr - SUBtherapeutic, H/H worse, PLTs ok  Goal of Therapy:  Heparin level 0.3-0.7 units/ml Monitor platelets by anticoagulation protocol: Yes   Plan:   Anti-Xa level SUBtherapeutic: Increase heparin infusion to 1650 units/hr  Recheck next anti-Xa level in ~ 8 hours  CBC daily while on heparin drip   Ena Dawley, PharmD 08/26/2020,6:04 AM

## 2020-08-26 NOTE — Progress Notes (Signed)
ANTICOAGULATION CONSULT NOTE  Pharmacy Consult for heparin Indication: atrial fibrillation  Patient Measurements: Heparin Dosing Weight: 90 kg  Labs: Recent Labs    08/24/20 0530 08/24/20 1452 08/25/20 0304 08/25/20 0827 08/25/20 1217 08/26/20 0433 08/26/20 1439  HGB 8.2*  --   --  7.0* 7.2* 6.9*  --   HCT 20.7*  --   --  RESULTS UNAVAILABLE DUE TO INTERFERING SUBSTANCE 19.4* 18.8*  --   PLT 153  --   --  139* 160 164  --   HEPARINUNFRC 0.72*   < >  --  0.30  --  0.25* 0.27*  CREATININE 5.23*  --  6.03*  --   --  6.94*  --    < > = values in this interval not displayed.   Estimated Creatinine Clearance: 13.5 mL/min (A) (by C-G formula based on SCr of 6.94 mg/dL (H)).  Medical History: Past Medical History:  Diagnosis Date  . CAD (coronary artery disease)   . Cataract   . CHICKENPOX, HX OF 08/30/2008   Qualifier: Diagnosis of  By: [name], [name]    . Chronic kidney disease   . Diabetes mellitus   . Glaucoma   . History of urinary calculi 08/30/2008   Qualifier: Diagnosis of  By: [name], Jacelyn Pi   . Hyperlipidemia   . Hypertension   . Renal calculus or stone   . Skin cancer of nose    ears and hands   Assessment: 70 year old male admitted with severe DKA, which has since resolved and patient now on SQ insulin regimen. Patient with worsening renal function, HD started. Patient with new afib 1/13, started on amiodarone and heparin. H&H low and trending down, platelets improved from baseline. No notes of recent bleeding noted  Goal of Therapy:  Heparin level 0.3-0.7 units/ml Monitor platelets by anticoagulation protocol: Yes   Plan:   Anti-Xa level remains SUBtherapeutic despite recent infusion rate increase:   Increase heparin infusion to 1850 units/hr  Recheck next anti-Xa level in 6 hours after rate change  CBC daily while on heparin drip   Dallie Piles, PharmD 08/26/2020,3:12 PM

## 2020-08-26 NOTE — Progress Notes (Addendum)
CRITICAL CARE NOTE 70 y.o.malewith PMH as noted below who presents with altered mental status after he was found unresponsive this morning.   Per EMS he was initially hypoxic although this appears to have resolved. The patient himself is unable to give any history.  PatientDx with COVID 1st week of Jan 2022 +NVD for several days ER shows severe acidosis with elevated sugars Patient with severe hypothermia and intubated for severe resp failure  Past Medical History:  He,  has a past medical history of CAD (coronary artery disease), Cataract, CHICKENPOX, HX OF (08/30/2008), Chronic kidney disease, Diabetes mellitus, Glaucoma, History of urinary calculi (08/30/2008), Hyperlipidemia, Hypertension, Renal calculus or stone, and Skin cancer of nose.   Significant Hospital Events:  1/10 admitted to ICU, severe DKA, gastroenteritis, COVID 19 infection causing DKA and gastroenteritis 1/11 severe resp failure 1/12 severe DKA 1/13 severe resp failure, worsening Renal failure 1/14- Reviewed care plan with cardiologist Dr Ubaldo Glassing, plan for cardiac cardoversion. Patient remains in shock with AFRVR on MV, medically optimizing for procedure. I met with daughter York Cerise.  1/15-patient improved slightly, now with rate controlled AF off Neo. Discussed case with cardiology. +ISCHEMIC CARDIOMYOPATHY 08/19/20-vitals are improved, rate controlled. BP normalized. Ventilator weaned to FiO2 60% remains crtically ill.  08/23/20 - vent weaned to 40% FiO2 and PEEP 5 08/25/20- patient remains critically ill, CBC with critially low Hb will repeat specimen and transfuse if needed today. Continuing renal replacement with severe electrolyte derragements and oliguric renal failure. Have updated POA Jaime today.  1/23 failure to wean from vent  Consults:  Cardiology Nephrology  Procedures:  08/16/20 CVL 08/24/2020 CVL 08/24/2020 ETT  Significant Diagnostic Tests:  CT Head 08/15/2020 Negative motion degraded head  CT.  Micro Data:  Tracheal Aspirate 08/18/20 - Serratia Marcescens   Antimicrobials:  Vancomycin 1/10 Cefepime 1/10     CC  follow up respiratory failure  SUBJECTIVE Patient remains critically ill Prognosis is guarded +multiorgan failure   BP 125/60   Pulse 82   Temp 99.32 F (37.4 C)   Resp (!) 32   Ht 6' 3.98" (1.93 m)   Wt 111.5 kg   SpO2 97%   BMI 29.93 kg/m    I/O last 3 completed shifts: In: 4601.1 [I.V.:2441.2; Other:25; NG/GT:1640; IV Piggyback:494.9] Out: 275 [Urine:275] No intake/output data recorded.  SpO2: 97 % FiO2 (%): 40 %  Estimated body mass index is 29.93 kg/m as calculated from the following:   Height as of this encounter: 6' 3.98" (1.93 m).   Weight as of this encounter: 111.5 kg.  SIGNIFICANT EVENTS   REVIEW OF SYSTEMS  PATIENT IS UNABLE TO PROVIDE COMPLETE REVIEW OF SYSTEMS DUE TO SEVERE CRITICAL ILLNESS   Pressure Injury 08/15/20 Sacrum Medial;Lower Deep Tissue Pressure Injury - Purple or maroon localized area of discolored intact skin or blood-filled blister due to damage of underlying soft tissue from pressure and/or shear. bruised looking non-blanchabl (Active)  08/15/20 1200  Location: Sacrum  Location Orientation: Medial;Lower  Staging: Deep Tissue Pressure Injury - Purple or maroon localized area of discolored intact skin or blood-filled blister due to damage of underlying soft tissue from pressure and/or shear.  Wound Description (Comments): bruised looking non-blanchable purple on soft tissue, Serous blisters bilaterally  Present on Admission:      PHYSICAL EXAMINATION: GENERAL:critically ill appearing, +resp distress NECK: Supple.  PULMONARY: +rhonchi, +wheezing CARDIOVASCULAR: S1 and S2.  GASTROINTESTINAL: Soft, nontender, +Positive bowel sounds.  MUSCULOSKELETAL: No swelling, clubbing, or edema.  NEUROLOGIC: obtunded, GCS<8 SKIN:intact,warm,dry  Patient assessed or the symptoms described in the history of  present illness.  In the context of the Global COVID-19 pandemic, which necessitated consideration that the patient might be at risk for infection with the SARS-CoV-2 virus that causes COVID-19, Institutional protocols and algorithms that pertain to the evaluation of patients at risk for COVID-19 are in a state of rapid change based on information released by regulatory bodies including the CDC and federal and state organizations. These policies and algorithms were followed during the patient's care while in hospital.    MEDICATIONS: I have reviewed all medications and confirmed regimen as documented   CULTURE RESULTS   Recent Results (from the past 240 hour(s))  Culture, respiratory     Status: None   Collection Time: 08/17/20  3:15 PM   Specimen: Tracheal Aspirate; Respiratory  Result Value Ref Range Status   Specimen Description   Final    TRACHEAL ASPIRATE Performed at [facility], [address], [place], [place] [address]    Special Requests   Final    NONE Performed at [facility], [place], [place] [address]    Gram Stain NO WBC SEEN MODERATE GRAM VARIABLE ROD   Final   Culture   Final    ABUNDANT SERRATIA MARCESCENS SUSCEPTIBILITIES PERFORMED ON PREVIOUS CULTURE WITHIN THE LAST 5 DAYS. Performed at [facility], [place] [address] [place], [place] [address]    Report Status 08/20/2020 FINAL  Final  Culture, respiratory     Status: None   Collection Time: 08/18/20 11:09 AM   Specimen: Tracheal Aspirate; Respiratory  Result Value Ref Range Status   Specimen Description   Final    TRACHEAL ASPIRATE Performed at [facility], [address] [place], [place] [address]    Special Requests   Final    NONE Performed at [facility], [place], [place] [address]    Gram Stain   Final    MODERATE WBC PRESENT, PREDOMINANTLY PMN MODERATE GRAM NEGATIVE RODS FEW GRAM VARIABLE ROD RARE GRAM POSITIVE  COCCI Performed at [facility], [place] [address], [place], [place] [address]    Culture ABUNDANT SERRATIA MARCESCENS  Final   Report Status 08/20/2020 FINAL  Final   Organism ID, Bacteria SERRATIA MARCESCENS  Final      Susceptibility   Serratia marcescens - MIC*    CEFAZOLIN >=64 RESISTANT Resistant     CEFEPIME <=0.12 SENSITIVE Sensitive     CEFTAZIDIME <=1 SENSITIVE Sensitive     CEFTRIAXONE <=0.25 SENSITIVE Sensitive     CIPROFLOXACIN <=0.25 SENSITIVE Sensitive     GENTAMICIN <=1 SENSITIVE Sensitive     TRIMETH/SULFA <=20 SENSITIVE Sensitive     * ABUNDANT SERRATIA MARCESCENS  MRSA PCR Screening     Status: None   Collection Time: 08/18/20 12:52 PM   Specimen: Nasopharyngeal  Result Value Ref Range Status   MRSA by PCR NEGATIVE NEGATIVE Final    Comment:        The GeneXpert MRSA Assay (FDA approved for NASAL specimens only), is one component of a comprehensive MRSA colonization surveillance program. It is not intended to diagnose MRSA infection nor to guide or monitor treatment for MRSA infections. Performed at [facility], [address] [place], [place] [address]   Culture, respiratory (non-expectorated)     Status: None   Collection Time: 08/21/20  4:52 PM   Specimen: Tracheal Aspirate; Respiratory  Result Value Ref Range Status  Specimen Description   Final    TRACHEAL ASPIRATE Performed at [facility], [address] [place], [place] [address]    Special Requests   Final    NONE Performed at [facility], [place], [place] [address]    Gram Stain   Final    FEW WBC PRESENT, PREDOMINANTLY PMN ABUNDANT GRAM VARIABLE ROD MODERATE GRAM POSITIVE COCCI Performed at [facility], [place] [address], [place], [place] [address]    Culture ABUNDANT SERRATIA MARCESCENS  Final   Report Status 08/24/2020 FINAL  Final   Organism ID, Bacteria SERRATIA MARCESCENS  Final      Susceptibility   Serratia marcescens  - MIC*    CEFAZOLIN >=64 RESISTANT Resistant     CEFEPIME <=0.12 SENSITIVE Sensitive     CEFTAZIDIME <=1 SENSITIVE Sensitive     CEFTRIAXONE <=0.25 SENSITIVE Sensitive     CIPROFLOXACIN <=0.25 SENSITIVE Sensitive     GENTAMICIN <=1 SENSITIVE Sensitive     TRIMETH/SULFA <=20 SENSITIVE Sensitive     * ABUNDANT SERRATIA MARCESCENS  CULTURE, BLOOD (ROUTINE X 2) w Reflex to ID Panel     Status: None (Preliminary result)   Collection Time: 08/22/20  1:37 PM   Specimen: BLOOD  Result Value Ref Range Status   Specimen Description BLOOD BLOOD LEFT HAND  Final   Special Requests   Final    BOTTLES DRAWN AEROBIC AND ANAEROBIC Blood Culture adequate volume   Culture   Final    NO GROWTH 4 DAYS Performed at [facility], [address] [place], [place] [address]    Report Status PENDING  Incomplete  CULTURE, BLOOD (ROUTINE X 2) w Reflex to ID Panel     Status: None (Preliminary result)   Collection Time: 08/22/20  1:37 PM   Specimen: BLOOD  Result Value Ref Range Status   Specimen Description BLOOD LEFT ANTECUBITAL  Final   Special Requests   Final    BOTTLES DRAWN AEROBIC AND ANAEROBIC Blood Culture adequate volume   Culture   Final    NO GROWTH 4 DAYS Performed at [facility], [address], [place], [place] [address]    Report Status PENDING  Incomplete          IMAGING    DG Chest Port 1 View  Result Date: 08/25/2020 CLINICAL DATA:  COVID-19.  Mental status change. EXAM: PORTABLE CHEST 1 VIEW COMPARISON:  Chest radiograph 08/22/2020 FINDINGS: ET tube mid trachea. Enteric tube courses inferior to the diaphragm. Central venous catheter tip projects over the superior vena cava. Right upper extremity PICC line tip projects over the superior vena cava. Stable cardiac and mediastinal contours. Elevation right hemidiaphragm. Similar diffuse bilateral airspace opacities. No pleural effusion or pneumothorax. IMPRESSION: Support apparatus as above. Similar-appearing  diffuse bilateral airspace opacities. Electronically Signed   By: Lovey Newcomer M.D.   On: 08/25/2020 13:31     Nutrition Status: Nutrition Problem: Moderate Malnutrition Etiology: chronic illness (CKD) Signs/Symptoms: mild fat depletion,mild muscle depletion Interventions: Tube feeding     Indwelling Urinary Catheter continued, requirement due to   Reason to continue Indwelling Urinary Catheter strict Intake/Output monitoring for hemodynamic instability   Central Line/ continued, requirement due to  Reason to continue [place] of central venous pressure or other hemodynamic parameters and poor IV access   Ventilator continued, requirement due to severe respiratory failure   Ventilator Sedation RASS 0 to -2      ASSESSMENT AND PLAN  SYNOPSIS  70 yo white male UNVACCINATED with COVID 19 infection causing gastroenteritis with severe hypoxic respiratory  failure with severe DKA with progressive renal failure with multiorgan failure with ischemic cardiomyopathy  Day 13 in ICU   Acute hypoxemic respiratory failure due to COVID-19 pneumonia / ARDS Mechanical ventilation via ARDS protocol, target PRVC 6 cc/kg Wean PEEP and FiO2 as able VAP prevention order set  Severe ACUTE Hypoxic and Hypercapnic Respiratory Failure -continue Full MV support -continue Bronchodilator Therapy -Wean Fio2 and PEEP as tolerated -VAP/VENT bundle implementation  ACUTE SYSTOLIC CARDIAC FAILURE- ISCHEMIC CARDIOMYOPATHY -oxygen as needed -Lasix as tolerated -follow up cardiac enzymes as indicated -follow up cardiology recs   ACUTE KIDNEY INJURY/Renal Failure -continue Foley Catheter-assess need -Avoid nephrotoxic agents -Follow urine output, BMP -Ensure adequate renal perfusion, optimize oxygenation -Renal dose medications HD as needed     NEUROLOGY Acute toxic metabolic encephalopathy, need for sedation as needed MRI brain tomorrow  CARDIAC ICU monitoring  ID Serratia  Pneumonia -continue IV abx as prescibed -follow up cultures  GI GI PROPHYLAXIS as indicated   DIET-->TF's as tolerated Constipation protocol as indicated  ENDO - will use ICU hypoglycemic\Hyperglycemia protocol if indicated   ACUTE ANEMIA- TRANSFUSE AS NEEDED  ELECTROLYTES -follow labs as needed -replace as needed -pharmacy consultation and following   DVT/GI PRX ordered and assessed TRANSFUSIONS AS NEEDED MONITOR FSBS I Assessed the need for Labs I Assessed the need for Foley I Assessed the need for Central Venous Line Family Discussion when available I Assessed the need for Mobilization I made an Assessment of medications to be adjusted accordingly Safety Risk assessment completed   CASE DISCUSSED IN MULTIDISCIPLINARY ROUNDS WITH ICU TEAM  Critical Care Time devoted to patient care services described in this note is 65 minutes.   Overall, patient is critically ill, prognosis is guarded.  Patient with Multiorgan failure and at high risk for cardiac arrest and death.    Corrin Parker, M.D.  [facility]  Medical Director [place] Director [facility] Cardio-Pulmonary Department

## 2020-08-27 DIAGNOSIS — J9601 Acute respiratory failure with hypoxia: Secondary | ICD-10-CM | POA: Diagnosis not present

## 2020-08-27 DIAGNOSIS — U071 COVID-19: Secondary | ICD-10-CM | POA: Diagnosis not present

## 2020-08-27 DIAGNOSIS — E872 Acidosis: Secondary | ICD-10-CM | POA: Diagnosis not present

## 2020-08-27 DIAGNOSIS — G928 Other toxic encephalopathy: Secondary | ICD-10-CM

## 2020-08-27 LAB — GLUCOSE, CAPILLARY
Glucose-Capillary: 106 mg/dL — ABNORMAL HIGH (ref 70–99)
Glucose-Capillary: 79 mg/dL (ref 70–99)
Glucose-Capillary: 81 mg/dL (ref 70–99)
Glucose-Capillary: 87 mg/dL (ref 70–99)
Glucose-Capillary: 90 mg/dL (ref 70–99)
Glucose-Capillary: 91 mg/dL (ref 70–99)

## 2020-08-27 LAB — CBC
HCT: 18.2 % — ABNORMAL LOW (ref 39.0–52.0)
Hemoglobin: 6.5 g/dL — ABNORMAL LOW (ref 13.0–17.0)
MCH: 29.5 pg (ref 26.0–34.0)
MCHC: 35.7 g/dL (ref 30.0–36.0)
MCV: 82.7 fL (ref 80.0–100.0)
Platelets: 189 10*3/uL (ref 150–400)
RBC: 2.2 MIL/uL — ABNORMAL LOW (ref 4.22–5.81)
RDW: 19.9 % — ABNORMAL HIGH (ref 11.5–15.5)
WBC: 18.4 10*3/uL — ABNORMAL HIGH (ref 4.0–10.5)
nRBC: 0 % (ref 0.0–0.2)

## 2020-08-27 LAB — MAGNESIUM: Magnesium: 2.3 mg/dL (ref 1.7–2.4)

## 2020-08-27 LAB — RENAL FUNCTION PANEL
Albumin: 1.3 g/dL — ABNORMAL LOW (ref 3.5–5.0)
Anion gap: 22 — ABNORMAL HIGH (ref 5–15)
BUN: 177 mg/dL — ABNORMAL HIGH (ref 8–23)
CO2: 18 mmol/L — ABNORMAL LOW (ref 22–32)
Calcium: 6.1 mg/dL — CL (ref 8.9–10.3)
Chloride: 93 mmol/L — ABNORMAL LOW (ref 98–111)
Creatinine, Ser: 5.9 mg/dL — ABNORMAL HIGH (ref 0.61–1.24)
GFR, Estimated: 10 mL/min — ABNORMAL LOW (ref >60)
Glucose, Bld: 94 mg/dL (ref 70–99)
Phosphorus: 10.5 mg/dL — ABNORMAL HIGH (ref 2.5–4.6)
Potassium: 4 mmol/L (ref 3.5–5.1)
Sodium: 133 mmol/L — ABNORMAL LOW (ref 135–145)

## 2020-08-27 LAB — PREPARE RBC (CROSSMATCH)

## 2020-08-27 LAB — HEMOGLOBIN AND HEMATOCRIT, BLOOD
HCT: 19.9 % — ABNORMAL LOW (ref 39.0–52.0)
Hemoglobin: 6.8 g/dL — ABNORMAL LOW (ref 13.0–17.0)

## 2020-08-27 LAB — CULTURE, BLOOD (ROUTINE X 2)
Culture: NO GROWTH
Culture: NO GROWTH
Special Requests: ADEQUATE
Special Requests: ADEQUATE

## 2020-08-27 LAB — POTASSIUM: Potassium: 5.1 mmol/L (ref 3.5–5.1)

## 2020-08-27 LAB — HEPARIN LEVEL (UNFRACTIONATED): Heparin Unfractionated: <0.1 IU/mL — ABNORMAL LOW (ref 0.30–0.70)

## 2020-08-27 LAB — ABO/RH: ABO/RH(D): O POS

## 2020-08-27 MED ORDER — SODIUM CHLORIDE 0.9% IV SOLUTION: Freq: Once | INTRAVENOUS | Status: AC

## 2020-08-27 MED ORDER — FUROSEMIDE 10 MG/ML IJ SOLN
160.0000 mg | Freq: Once | INTRAVENOUS | Status: AC
  Administered 2020-08-27: 160 mg via INTRAVENOUS
  Filled 2020-08-27: qty 16

## 2020-08-27 MED ORDER — NUTRISOURCE FIBER PO PACK
1.0000 | PACK | Freq: Two times a day (BID) | ORAL | Status: DC
  Administered 2020-08-27 – 2020-08-30 (×6): 1
  Filled 2020-08-27 (×6): qty 1

## 2020-08-27 MED ORDER — NEPRO/CARBSTEADY PO LIQD
1000.0000 mL | ORAL | Status: DC
  Administered 2020-08-27 – 2020-08-28 (×2): 1000 mL

## 2020-08-27 NOTE — Progress Notes (Signed)
[facility]  ROUNDING NOTE   Subjective:   [name] admitted to [facility] on 5/46/5681 for Metabolic acidosis [E75.1] Encounter for central line placement [Z45.2] Acute renal failure (ARF) ([place]) [N17.9] Acute respiratory failure with hypoxia ([place]) [J96.01] Sepsis, due to unspecified organism, unspecified whether acute organ dysfunction present ([place]) [A41.9] COVID-19 [U07.1]   Patient presented to the ER via EMS from home for altered mental status.  Recently treated for COVID. History taken from daughter on the phone and wife at bedside. Patient is unvaccinated.   Started on dialysis on 1/13. Intermittent treatments on 1/13, 1/15, 1/18, 1/20 and 1/23  Wife at bedside. Daughter on phone  MRI results reviewed with family.   Objective:  Vital signs in last 24 hours:  Temp:  [97.34 F (36.3 C)-99.32 F (37.4 C)] 99.32 F (37.4 C) (01/24 1000) Pulse Rate:  [76-90] 87 (01/24 1000) Resp:  [24-34] 28 (01/24 1000) BP: (96-142)/(53-96) 133/62 (01/24 1000) SpO2:  [95 %-100 %] 100 % (01/24 1000) FiO2 (%):  [35 %-40 %] 35 % (01/24 0740) Weight:  [700 kg] 112 kg (01/24 0500)  Weight change: 0.5 kg Filed Weights   08/25/20 0359 08/26/20 0232 08/27/20 0500  Weight: 110.7 kg 111.5 kg 112 kg    Intake/Output: I/O last 3 completed shifts: In: 1044.6 [I.V.:434.6; NG/GT:460; IV Piggyback:150.1] Out: 1749 [Urine:175; SWHQP:5916]   Intake/Output this shift:  No intake/output data recorded.  Physical Exam: General: Critically ill   Head: ETT  Eyes: Eyes closed  Neck: trachea midline  Lungs:  PRVC FiO2 35%  Heart: Regular rate and rhythm  Abdomen:  Soft, +bowel sounds  Extremities:  + peripheral edema.  Neurologic: Intubated and sedate  Skin: No lesions  Access: Right femoral temp HD catheter 3/84    Basic Metabolic Panel: Recent Labs  Lab 08/23/20 0540 08/24/20 0530 08/25/20 0304 08/26/20 0433 08/27/20 0025  NA 133* 132* 132* 131* 133*  K 6.2* 5.1 5.2*  5.0 4.0  CL 94* 94* 92* 93* 93*  CO2 20* 20* 20* 17* 18*  GLUCOSE 229* 138* 226* 223* 94  BUN 183* 137* 166* 206* 177*  CREATININE 6.33* 5.23* 6.03* 6.94* 5.90*  CALCIUM 7.5* 7.3* 6.6* 6.2* 6.1*  MG 2.4 2.1 2.3 2.7* 2.3  PHOS 10.1* 7.8* 9.4*  10.0* 11.0*  11.9* 10.5*    Liver Function Tests: Recent Labs  Lab 08/22/20 0352 08/22/20 1337 08/23/20 0540 08/24/20 0530 08/25/20 0304 08/26/20 0433 08/27/20 0025  AST 158*  --   --   --   --   --   --   ALT 166*  --   --   --   --   --   --   ALKPHOS 316*  --   --   --   --   --   --   BILITOT 1.1  --   --   --   --   --   --   PROT 5.3*  --   --   --   --   --   --   ALBUMIN 1.5*  1.5*   < > 1.4* 1.4* 1.3* 1.2* 1.3*   < > = values in this interval not displayed.   No results for input(s): LIPASE, AMYLASE in the last 168 hours. No results for input(s): AMMONIA in the last 168 hours.  CBC: Recent Labs  Lab 08/24/20 0530 08/25/20 0827 08/25/20 1217 08/26/20 0433 08/27/20 0025  WBC 22.3* 22.2* 24.3* 19.2* 18.4*  NEUTROABS  --   --  23.0*  --   --   HGB 8.2* 7.0* 7.2* 6.9* 6.5*  HCT 20.7* RESULTS UNAVAILABLE DUE TO INTERFERING SUBSTANCE 19.4* 18.8* 18.2*  MCV 75.5* RESULTS UNAVAILABLE DUE TO INTERFERING SUBSTANCE 81.5 83.2 82.7  PLT 153 139* 160 164 189    Cardiac Enzymes: No results for input(s): CKTOTAL, CKMB, CKMBINDEX, TROPONINI in the last 168 hours.  BNP: Invalid input(s): POCBNP  CBG: Recent Labs  Lab 08/26/20 1551 08/26/20 1930 08/26/20 2308 08/27/20 0307 08/27/20 0818  GLUCAP 127* 115* 90 81 87    Microbiology: Results for orders placed or performed during the hospital encounter of 08/04/2020  Blood Culture (routine x 2)     Status: None   Collection Time: 08/09/2020  9:50 AM   Specimen: BLOOD  Result Value Ref Range Status   Specimen Description BLOOD LEFT [facility]  Final   Special Requests   Final    BOTTLES DRAWN AEROBIC AND ANAEROBIC Blood Culture adequate volume   Culture   Final    NO GROWTH 5  DAYS Performed at [facility], [address] [place], [place] [address]    Report Status 08/18/2020 FINAL  Final  Blood Culture (routine x 2)     Status: None   Collection Time: 08/15/2020  9:50 AM   Specimen: BLOOD  Result Value Ref Range Status   Specimen Description BLOOD RIGHT AC  Final   Special Requests   Final    BOTTLES DRAWN AEROBIC AND ANAEROBIC Blood Culture results may not be optimal due to an excessive volume of blood received in culture bottles   Culture   Final    NO GROWTH 5 DAYS Performed at [facility], [address] [place], [place] [address]    Report Status 08/18/2020 FINAL  Final  Urine culture     Status: None   Collection Time: 08/15/2020 11:36 AM   Specimen: Urine, Random  Result Value Ref Range Status   Specimen Description   Final    URINE, RANDOM Performed at [facility], [address] [place], [place] [address]    Special Requests   Final    NONE Performed at [facility], [address] [place], [place] [address]    Culture   Final    NO GROWTH Performed at [facility], [place] [address], [place], [place] [address]    Report Status 08/14/2020 FINAL  Final  Culture, respiratory     Status: None   Collection Time: 08/17/20  3:15 PM   Specimen: Tracheal Aspirate; Respiratory  Result Value Ref Range Status   Specimen Description   Final    TRACHEAL ASPIRATE Performed at [facility], [address], [place], [place] [address]    Special Requests   Final    NONE Performed at [facility], [place], [place] [address]    Gram Stain NO WBC SEEN MODERATE GRAM VARIABLE ROD   Final   Culture   Final    ABUNDANT SERRATIA MARCESCENS SUSCEPTIBILITIES PERFORMED ON PREVIOUS CULTURE WITHIN THE LAST 5 DAYS. Performed at [facility], [place] [address] [place], [place] [address]    Report Status 08/20/2020 FINAL  Final  Culture, respiratory     Status: None    Collection Time: 08/18/20 11:09 AM   Specimen: Tracheal Aspirate; Respiratory  Result Value Ref Range Status   Specimen Description   Final    TRACHEAL ASPIRATE Performed at [facility], [address] [place], [place] [address]    Special Requests   Final    NONE Performed at [facility], [place], [place] [address]    Gram Stain   Final    MODERATE WBC PRESENT, PREDOMINANTLY PMN MODERATE GRAM NEGATIVE RODS FEW GRAM VARIABLE ROD RARE GRAM POSITIVE COCCI Performed at [facility], [place] [address], [place], [place] [address]    Culture ABUNDANT SERRATIA MARCESCENS  Final   Report Status 08/20/2020 FINAL  Final   Organism ID, Bacteria SERRATIA MARCESCENS  Final      Susceptibility   Serratia marcescens - MIC*    CEFAZOLIN >=64 RESISTANT Resistant     CEFEPIME <=0.12 SENSITIVE Sensitive     CEFTAZIDIME <=1 SENSITIVE Sensitive     CEFTRIAXONE <=0.25 SENSITIVE Sensitive     CIPROFLOXACIN <=0.25 SENSITIVE Sensitive     GENTAMICIN <=1 SENSITIVE Sensitive     TRIMETH/SULFA <=20 SENSITIVE Sensitive     * ABUNDANT SERRATIA MARCESCENS  MRSA PCR Screening     Status: None   Collection Time: 08/18/20 12:52 PM   Specimen: Nasopharyngeal  Result Value Ref Range Status   MRSA by PCR NEGATIVE NEGATIVE Final    Comment:        The GeneXpert MRSA Assay (FDA approved for NASAL specimens only), is one component of a comprehensive MRSA colonization surveillance program. It is not intended to diagnose MRSA infection nor to guide or monitor treatment for MRSA infections. Performed at [facility], [address] [place], [place] [address]   Culture, respiratory (non-expectorated)     Status: None   Collection Time: 08/21/20  4:52 PM   Specimen: Tracheal Aspirate; Respiratory  Result Value Ref Range Status   Specimen Description   Final    TRACHEAL ASPIRATE Performed at [facility], [address] [place], [place]  [address]    Special Requests   Final    NONE Performed at [facility], [address] [place], [place] [address]    Gram Stain   Final    FEW WBC PRESENT, PREDOMINANTLY PMN ABUNDANT GRAM VARIABLE ROD MODERATE GRAM POSITIVE COCCI Performed at [facility], [place] [address] [place], [place] [address]    Culture ABUNDANT SERRATIA MARCESCENS  Final   Report Status 08/24/2020 FINAL  Final   Organism ID, Bacteria SERRATIA MARCESCENS  Final      Susceptibility   Serratia marcescens - MIC*    CEFAZOLIN >=64 RESISTANT Resistant     CEFEPIME <=0.12 SENSITIVE Sensitive     CEFTAZIDIME <=1 SENSITIVE Sensitive     CEFTRIAXONE <=0.25 SENSITIVE Sensitive     CIPROFLOXACIN <=0.25 SENSITIVE Sensitive     GENTAMICIN <=1 SENSITIVE Sensitive     TRIMETH/SULFA <=20 SENSITIVE Sensitive     * ABUNDANT SERRATIA MARCESCENS  CULTURE, BLOOD (ROUTINE X 2) w Reflex to ID Panel     Status: None   Collection Time: 08/22/20  1:37 PM   Specimen: BLOOD  Result Value Ref Range Status   Specimen Description BLOOD BLOOD LEFT HAND  Final   Special Requests   Final    BOTTLES DRAWN AEROBIC AND ANAEROBIC Blood Culture adequate volume   Culture   Final    NO GROWTH 5 DAYS Performed at [facility], [address] [place], [place] [address]    Report Status 08/27/2020 FINAL  Final  CULTURE, BLOOD (ROUTINE X 2) w Reflex to ID Panel     Status: None  Collection Time: 08/22/20  1:37 PM   Specimen: BLOOD  Result Value Ref Range Status   Specimen Description BLOOD LEFT ANTECUBITAL  Final   Special Requests   Final    BOTTLES DRAWN AEROBIC AND ANAEROBIC Blood Culture adequate volume   Culture   Final    NO GROWTH 5 DAYS Performed at [facility], [address] [place], [place] [address]    Report Status 08/27/2020 FINAL  Final    Coagulation Studies: No results for input(s): LABPROT, INR in the last 72 hours.  Urinalysis: No results for input(s): COLORURINE, LABSPEC,  PHURINE, GLUCOSEU, HGBUR, BILIRUBINUR, KETONESUR, PROTEINUR, UROBILINOGEN, NITRITE, LEUKOCYTESUR in the last 72 hours.  Invalid input(s): APPERANCEUR    Imaging: MR BRAIN WO CONTRAST  Result Date: 08/26/2020 CLINICAL DATA:  Initial evaluation for anoxic brain injury. EXAM: MRI HEAD WITHOUT CONTRAST TECHNIQUE: Multiplanar, multiecho pulse sequences of the brain and surrounding structures were obtained without intravenous contrast. COMPARISON:  Prior CT from 08/23/2020. FINDINGS: Brain: Cerebral volume within normal limits for age. Mild chronic microvascular ischemic disease noted involving the periventricular and deep white matter both cerebral hemispheres. No abnormal foci of restricted diffusion to suggest acute or subacute ischemia. Gray-white matter differentiation maintained. No signal changes to suggest anoxic brain injury. No encephalomalacia to suggest chronic cortical infarction. No acute intracranial hemorrhage. Innumerable scattered chronic micro hemorrhages seen involving the bilateral cerebral and cerebellar hemispheres, predominantly peripheral in location, and most suggestive of underlying cerebral amyloid angiopathy. No mass lesion, midline shift or mass effect. No hydrocephalus or extra-axial fluid collection. Pituitary gland suprasellar region normal. Midline structures intact. Vascular: Major intracranial vascular flow voids are maintained. Skull and upper cervical spine: Craniocervical junction within normal limits. Bone marrow signal intensity normal. No scalp soft tissue abnormality. Sinuses/Orbits: Globes and orbital soft tissues demonstrate no acute finding. Patient status post ocular lens replacement on the left. Paranasal sinuses are largely clear. Layering fluid seen within the nasopharynx. Large bilateral mastoid effusions noted. Patient is intubated. Other: None. IMPRESSION: 1. No acute intracranial abnormality. No evidence for anoxic brain injury. 2. Innumerable scattered chronic  micro hemorrhages involving the bilateral cerebral and cerebellar hemispheres, predominantly peripheral in location, and most suggestive of underlying cerebral amyloid angiopathy. 3. Mild chronic microvascular ischemic disease. Electronically Signed   By: Jeannine Boga M.D.   On: 08/26/2020 22:15   DG Chest Port 1 View  Result Date: 08/25/2020 CLINICAL DATA:  COVID-19.  Mental status change. EXAM: PORTABLE CHEST 1 VIEW COMPARISON:  Chest radiograph 08/22/2020 FINDINGS: ET tube mid trachea. Enteric tube courses inferior to the diaphragm. Central venous catheter tip projects over the superior vena cava. Right upper extremity PICC line tip projects over the superior vena cava. Stable cardiac and mediastinal contours. Elevation right hemidiaphragm. Similar diffuse bilateral airspace opacities. No pleural effusion or pneumothorax. IMPRESSION: Support apparatus as above. Similar-appearing diffuse bilateral airspace opacities. Electronically Signed   By: Lovey Newcomer M.D.   On: 08/25/2020 13:31     Medications:   . sodium chloride    . ceFEPime (MAXIPIME) IV Stopped (08/26/20 1812)  . famotidine (PEPCID) IV 20 mg (08/27/20 1020)  . feeding supplement (NEPRO CARB STEADY) 1,000 mL (08/21/20 1659)  . fentaNYL infusion INTRAVENOUS Stopped (08/25/20 0846)  . furosemide     . amiodarone  200 mg Per Tube BID  . chlorhexidine gluconate (MEDLINE KIT)  15 mL Mouth Rinse BID  . Chlorhexidine Gluconate Cloth  6 each Topical Daily  . docusate  100 mg Per Tube  BID  . feeding supplement (PROSource TF)  90 mL Per Tube BID  . insulin aspart  0-20 Units Subcutaneous Q4H  . insulin detemir  20 Units Subcutaneous BID  . mouth rinse  15 mL Mouth Rinse 10 times per day  . metoprolol tartrate  12.5 mg Per NG tube BID  . multivitamin  1 tablet Per Tube QHS  . polyethylene glycol  17 g Per Tube Daily  . sodium chloride flush  10-40 mL Intracatheter Q12H  . sodium chloride flush  3 mL Intravenous Q12H  . sodium  zirconium cyclosilicate  10 g Per Tube TID   sodium chloride, acetaminophen (TYLENOL) oral liquid 160 mg/5 mL, docusate, fentaNYL, heparin, metoprolol tartrate, midazolam, sodium chloride flush, sodium chloride flush  Assessment/ Plan:  [name] is a 70 y.o. white male with coronary artery disease, diabetes, glaucoma, history of kidney stones, hyperlipidemia, hypertension, left knee replacement, lithotripsy, basal cell carcinoma, was admitted on 5/36/1443 with Metabolic acidosis [X54.0] Encounter for central line placement [Z45.2] Acute renal failure (ARF) ([place]) [N17.9] Acute respiratory failure with hypoxia ([place]) [J96.01] Sepsis, due to unspecified organism, unspecified whether acute organ dysfunction present ([place]) [A41.9] COVID-19 [U07.1]   #Acute kidney injury Baseline creatinine 1.0 from August 02, 2020 Presenting creatinine of 2.15 Recent Labs       Lab Results  Component Value Date   CREATININE 5.23 (H) 08/24/2020   CREATININE 6.33 (H) 08/23/2020   CREATININE 6.15 (H) 08/23/2020     Seen and examined on hemodialysis. Monitor daily for dialysis need.  - high dose IV furosemide today.   #Acute respiratory failure Requiring ventilator support.  Intubated 08/14/2020  #COVID-19 pneumonia Treated with ivermectin as outpatient  #Diabetes type 2, insulin dependent: DKA on admission Outpatient regimen of metformin, Jardiance (empagliflozin), Amaryl as outpatient Hemoglobin A1c 7.9% from June 26, 2020 Recent Labs       Lab Results  Component Value Date   HGBA1C 7.5 08/67/6195      #Metabolic acidosis/hyperkalemia Treated with dialysis and Lokelma  #Anemia with kidney failure: transfusion for today. ESA with HD treatmsns.   Overall prognosis is poor. Discussed with wife and daughter over the phone.    LOS: 14 Lissette Schenk 1/24/202210:23 AM

## 2020-08-27 NOTE — Consult Note (Signed)
Neurology Consultation  Reason for Consult: Persistent altered mental status Referring Physician: Dr. Patricia Pesa  CC: Persistent altered mental status  History is obtained from: Chart review, patient's wife at bedside, patient's daughter on the phone  HPI: Nathaniel Thomas is a 70 y.o. male past medical history of coronary artery disease, diabetes, hypertension, hyperlipidemia, was brought into the hospital and admitted to the ICU on August 13, 2020 for concerns for altered mental status after he was found to be unresponsive that same morning.  He was diagnosed with COVID-19 a week prior to that and when EMS arrived for the call for altered mental status, he was initially hypoxic but then that resolved.  In the emergency room, patient's sugars were elevated and he had severe acidosis.  He was also hypothermic and unable to protect his airway-and hence was emergently intubated.  Chest x-ray on admission with borderline cardiomegaly without active disease. Patient noted to be in severe DKA on 08/15/2020 and in severe respiratory failure 08/15/2019 requiring treatment with diuretics.  On 08/17/2020, patient cardioverted for A. fib with RVR.  Started on heparin drip.  In the next 2 days, he was taken off of the neo and rate controlled atrial fibrillation with vitals showing some improvement, rate controlled and blood pressure normalized.  Kept on heparin drip.  In addition to DKA and cardiac issues, he also developed an acute renal failure for which he required intermittent dialysis which is still ongoing. His sedation and paralytics were discontinued around 48 hours or so ago.  He continues to remain unresponsive.  Brain imaging in the form of an MRI brain was obtained-no acute stroke or evidence of anoxic injury but shows evidence of underlying SWI abnormalities-chronic microhemorrhages-likely secondary to cerebral amyloid angiopathy. Continues to fail weaning trials.  Also noted to have critically low  hemoglobin levels-getting transfusion today. EEG with evidence of diffuse encephalopathy on 08/23/2020.  No seizures.  Neurological consultation for persistent altered mental status and possible prognostication.    ROS: Unable to perform due to patient's mentation  Past Medical History:  Diagnosis Date  . CAD (coronary artery disease)   . Cataract   . CHICKENPOX, HX OF 08/30/2008   Qualifier: Diagnosis of  By: [name], [name]    . Chronic kidney disease   . Diabetes mellitus   . Glaucoma   . History of urinary calculi 08/30/2008   Qualifier: Diagnosis of  By: [name], Jacelyn Pi   . Hyperlipidemia   . Hypertension   . Renal calculus or stone   . Skin cancer of nose    ears and hands    Family History  Problem Relation Age of Onset  . Heart disease Brother        Atrial fibrillation  . Heart disease Father        CHF  . Heart disease Mother   . Heart disease Brother        atrial fib  . Cancer Sister        bone    Social History:   reports that he has never smoked. He quit smokeless tobacco use about 16 years ago. He reports that he does not drink alcohol and does not use drugs.  Medications  Current Facility-Administered Medications:  .  0.9 %  sodium chloride infusion, 250 mL, Intravenous, PRN, Flora Lipps, MD .  acetaminophen (TYLENOL) 160 MG/5ML solution 650 mg, 650 mg, Per Tube, Q4H PRN, Flora Lipps, MD, 650 mg at 08/24/20 1317 .  amiodarone (  PACERONE) tablet 200 mg, 200 mg, Per Tube, BID, Rust-Chester, Britton L, NP, 200 mg at 08/27/20 1016 .  chlorhexidine gluconate (MEDLINE KIT) (PERIDEX) 0.12 % solution 15 mL, 15 mL, Mouth Rinse, BID, Kasa, Kurian, MD, 15 mL at 08/27/20 0821 .  Chlorhexidine Gluconate Cloth 2 % PADS 6 each, 6 each, Topical, Daily, Flora Lipps, MD, 6 each at 08/27/20 1017 .  docusate (COLACE) 50 MG/5ML liquid 100 mg, 100 mg, Per Tube, BID, Flora Lipps, MD, 100 mg at 08/24/20 2107 .  docusate (COLACE) 50 MG/5ML liquid 100 mg, 100 mg, Per  Tube, BID PRN, Flora Lipps, MD .  famotidine (PEPCID) IVPB 20 mg premix, 20 mg, Intravenous, Q24H, Kasa, Kurian, MD, Last Rate: 100 mL/hr at 08/27/20 1020, 20 mg at 08/27/20 1020 .  feeding supplement (NEPRO CARB STEADY) liquid 1,000 mL, 1,000 mL, Per Tube, Continuous, Kasa, Kurian, MD, Last Rate: 55 mL/hr at 08/27/20 1142, 1,000 mL at 08/27/20 1142 .  feeding supplement (PROSource TF) liquid 90 mL, 90 mL, Per Tube, BID, Rosine Door, MD, 90 mL at 08/27/20 1020 .  fentaNYL (SUBLIMAZE) bolus via infusion 25 mcg, 25 mcg, Intravenous, Q15 min PRN, Flora Lipps, MD, 25 mcg at 08/20/20 0247 .  fentaNYL 2583mg in NS 2540m(1039mml) infusion-PREMIX, 0-250 mcg/hr, Intravenous, Continuous, DewFreddi StarrD, Stopped at 08/25/20 [id number] fiber (NUTRISOURCE FIBER) 1 packet, 1 packet, Per Tube, BID, KasMortimer Friesurian, MD .  furosemide (LASIX) 160 mg in dextrose 5 % 50 mL IVPB, 160 mg, Intravenous, Once, Kolluru, Sarath, MD, Last Rate: 66 mL/hr at 08/27/20 1139, 160 mg at 08/27/20 1139 .  heparin injection 1,000-6,000 Units, 1,000-6,000 Units, CRRT, PRN, KeeBradly BienenstockP, 2,000 Units at 08/16/20 1116 .  insulin aspart (novoLOG) injection 0-20 Units, 0-20 Units, Subcutaneous, Q4H, KeeDarel Hong NP, 3 Units at 08/26/20 1552 .  insulin detemir (LEVEMIR) injection 20 Units, 20 Units, Subcutaneous, BID, DewFreddi StarrD, 20 Units at 08/27/20 1017 .  MEDLINE mouth rinse, 15 mL, Mouth Rinse, 10 times per day, KasFlora LippsD, 15 mL at 08/27/20 1143 .  metoprolol tartrate (LOPRESSOR) injection 5 mg, 5 mg, Intravenous, Q6H PRN, Rust-Chester, Britton L, NP, 5 mg at 08/18/20 1528 .  metoprolol tartrate (LOPRESSOR) tablet 12.5 mg, 12.5 mg, Per NG tube, BID, Aleskerov, Fuad, MD, 12.5 mg at 08/27/20 1017 .  midazolam (VERSED) injection 1-2 mg, 1-2 mg, Intravenous, Q2H PRN, Rust-Chester, Britton L, NP .  multivitamin (RENA-VIT) tablet 1 tablet, 1 tablet, Per Tube, QHSFrancina AmesD, 1 tablet at  08/26/20 2301 .  polyethylene glycol (MIRALAX / GLYCOLAX) packet 17 g, 17 g, Per Tube, Daily, DewFreda Jackson MD, 17 g at 08/24/20 1000 .  sodium chloride flush (NS) 0.9 % injection 10-40 mL, 10-40 mL, Intracatheter, Q12H, DewFreda Jackson MD, 10 mL at 08/26/20 2317 .  sodium chloride flush (NS) 0.9 % injection 10-40 mL, 10-40 mL, Intracatheter, PRN, DewFreda Jackson MD .  sodium chloride flush (NS) 0.9 % injection 3 mL, 3 mL, Intravenous, Q12H, Kasa, Kurian, MD, 3 mL at 08/26/20 2317 .  sodium chloride flush (NS) 0.9 % injection 3 mL, 3 mL, Intravenous, PRN, KasMortimer Friesurian, MD .  sodium zirconium cyclosilicate (LOKELMA) packet 10 g, 10 g, Per Tube, TID, DewFreddi StarrD, 10 g at 08/27/20 1016   Exam: Current vital signs: BP (!) 141/66   Pulse 86   Temp 98.78 F (37.1 C)   Resp (!) 25   Ht 6' 3.98" (  1.93 m)   Wt 112 kg   SpO2 99%   BMI 30.07 kg/m  Vital signs in last 24 hours: Temp:  [96.98 F (36.1 C)-99.32 F (37.4 C)] 98.78 F (37.1 C) (01/24 1115) Pulse Rate:  [76-89] 86 (01/24 1115) Resp:  [23-34] 25 (01/24 1115) BP: (96-142)/(53-96) 141/66 (01/24 1100) SpO2:  [95 %-100 %] 99 % (01/24 1115) FiO2 (%):  [35 %-40 %] 35 % (01/24 0740) Weight:  [433 kg] 112 kg (01/24 0500) General: Intubated, no sedation HEENT: Normocephalic atraumatic Lungs: Vented Cardiovascular: Regular rhythm Abdomen mildly distended, nontender Extremities warm well perfused Neurological exam Patient on no sedation Intubated No spontaneous movement Pupils equal round react light Corneal reflexes present bilaterally Breathes over the ventilator No grimace or withdrawal to noxious stimulation with sternal rub or noxious stimulation to the trapezoids. Areflexic  Labs I have reviewed labs in epic and the results pertinent to this consultation are:   CBC    Component Value Date/Time   WBC 18.4 (H) 08/27/2020 0025   RBC 2.20 (L) 08/27/2020 0025   HGB 6.5 (L) 08/27/2020 0025   HCT  18.2 (L) 08/27/2020 0025   PLT 189 08/27/2020 0025   MCV 82.7 08/27/2020 0025   MCH 29.5 08/27/2020 0025   MCHC 35.7 08/27/2020 0025   RDW 19.9 (H) 08/27/2020 0025   LYMPHSABS 0.4 (L) 08/25/2020 1217   MONOABS 0.5 08/25/2020 1217   EOSABS 0.0 08/25/2020 1217   BASOSABS 0.0 08/25/2020 1217    CMP     Component Value Date/Time   NA 133 (L) 08/27/2020 0025   NA 139 09/03/2016 1035   K 4.0 08/27/2020 0025   CL 93 (L) 08/27/2020 0025   CO2 18 (L) 08/27/2020 0025   GLUCOSE 94 08/27/2020 0025   BUN 177 (H) 08/27/2020 0025   BUN 17 09/03/2016 1035   CREATININE 5.90 (H) 08/27/2020 0025   CREATININE 0.89 01/11/2015 0833   CALCIUM 6.1 (LL) 08/27/2020 0025   PROT 5.3 (L) 08/22/2020 0352   PROT 7.1 09/03/2016 1035   ALBUMIN 1.3 (L) 08/27/2020 0025   ALBUMIN 4.5 09/03/2016 1035   AST 158 (H) 08/22/2020 0352   ALT 166 (H) 08/22/2020 0352   ALKPHOS 316 (H) 08/22/2020 0352   BILITOT 1.1 08/22/2020 0352   BILITOT 0.3 09/03/2016 1035   GFRNONAA 10 (L) 08/27/2020 0025   GFRNONAA >89 01/11/2015 0833   GFRAA 92 09/03/2016 1035   GFRAA >89 01/11/2015 0833    Imaging I have reviewed the images obtained-MRI of the brain-08/26/2020-with no evidence of acute ischemic stroke.  SWI imaging reveals numerous microhemorrhages mostly peripherally concerning and consistent with cerebral amyloid angiopathy. No acute intracerebral bleeding.  EEG August 24, 2020-diffuse encephalopathy.  No seizures.  Assessment: 70 year old man currently critically ill with COVID-19 infection, respiratory failure with difficulty to wean, acute renal failure, initially came in with DKA and the clinical course of correction complicated by.  Of hypoglycemia, acute anemia, remains unresponsive despite discontinuing sedation now for about 48 hours.  Brain MRI imaging with no acute ischemic stroke but evidence of cerebral amyloid angiopathy.  Cannot rule out hypoxic ischemic encephalopathy due to prolonged hypoxia respiratory  failure. Given the fact that he was on paralytics and sedation for a prolonged period of time in the setting of his acute renal failure, it might be prudent to give him another 24 to 48 hours to see if his exam improves any. On my examination, he only has brainstem reflexes present and I could not elicit  any higher cortical function. Given that he has remained critically sick with respiratory, GI, renal, endocrine failures over the past 14 days in itself does not portend him a great prognosis but from a neurologically meaningful recovery standpoint, his current examination looks concerning for bad outcome.  Impression: Multifactorial toxic metabolic encephalopathy in the setting of multiorgan failure. 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 From a neurological standpoint, I would give him 24 to 48 hours more at least and repeat exams to evaluate for any improvement from today's exam. I do not see a need to repeat an MRI or EEG. I had a detailed discussion with the wife at bedside and daughter over the phone and explained to them the assessment above. I also showed them the MRI pictures in PACS and discussed the fact that given possible underlying cerebral amyloid angiopathy, he has poor brain reserve and the acute illness and multiorgan failure are in addition to the underlying CAA hampering his recovery, and because of the same reason of the poor brain reserve and CAA, neurologically meaningful recovery might not be to a good extent or fast enough.  Stressed understanding. Discussed my plan with attending intensivist Dr. Mortimer Fries on the unit  Will follow.  -- Nathaniel Portland, MD Neurologist [facility] Pager: [phone]  CRITICAL CARE ATTESTATION Performed by: Nathaniel Portland, MD Total critical  care time: 44 minutes Critical care time was exclusive of separately billable procedures and treating other patients and/or supervising APPs/Residents/Students Critical care was necessary to treat or prevent imminent or life-threatening deterioration due to multifactorial toxic metabolic encephalopathy, hypoxic ischemic encephalopathy This patient is critically ill and at significant risk for neurological worsening and/or death and care requires constant monitoring. Critical care was time spent personally by me on the following activities: development of treatment plan with patient and/or surrogate as well as nursing, discussions with consultants, evaluation of patient's response to treatment, examination of patient, obtaining history from patient or surrogate, ordering and performing treatments and interventions, ordering and review of laboratory studies, ordering and review of radiographic studies, pulse oximetry, re-evaluation of patient's condition, participation in multidisciplinary rounds and medical decision making of high complexity in the care of this patient.

## 2020-08-27 NOTE — Progress Notes (Addendum)
CRITICAL CARE NOTE 70 y.o.malewith PMH as noted below who presents with altered mental status after he was found unresponsive this morning.   Per EMS he was initially hypoxic although this appears to have resolved. The patient himself is unable to give any history.  PatientDx with COVID 1st week of Jan 2022 +NVD for several days ER shows severe acidosis with elevated sugars Patient with severe hypothermia and intubated for severe resp failure  Past Medical History:  He, has a past medical history of CAD (coronary artery disease), Cataract, CHICKENPOX, HX OF (08/30/2008), Chronic kidney disease, Diabetes mellitus, Glaucoma, History of urinary calculi (08/30/2008), Hyperlipidemia, Hypertension, Renal calculus or stone, and Skin cancer of nose.   Significant Hospital Events:  1/10 admitted to ICU, severe DKA, gastroenteritis, COVID 19 infection causing DKA and gastroenteritis 1/11 severe resp failure 1/12 severe DKA 1/13 severe resp failure, worsening Renal failure 1/14- Reviewed care plan with cardiologist Dr Fath, plan for cardiac cardoversion. Patient remains in shock with AFRVR on MV, medically optimizing for procedure. I met with daughter Jaime.  1/15-patient improved slightly, now with rate controlled AF off Neo. Discussed case with cardiology. +ISCHEMIC CARDIOMYOPATHY 08/18/20 - Serratia Marcescens 08/19/20-vitals are improved, rate controlled. BP normalized. Ventilator weaned to FiO2 60% remains crtically ill.  08/23/20 - vent weaned to 40% FiO2 and PEEP 5 08/25/20-patient remains critically ill, CBC with critially low Hb will repeat specimen and transfuse if needed today. Continuing renal replacement with severe electrolyte derragements and oliguric renal failure. Have updated POA Jaime today. 1/23 failure to wean from vent  1/24 MRI Innumerable scattered chronic micro hemorrhages involving the bilateral cerebral and cerebellar hemispheres, predominantly peripheral in  location, and most suggestive of underlying cerebral amyloid angiopathy.     CC  follow up respiratory failure  SUBJECTIVE Patient remains critically ill Prognosis is guarded  Vent Mode: PRVC FiO2 (%):  [35 %-40 %] 35 % Set Rate:  [22 bmp] 22 bmp Vt Set:  [520 mL] 520 mL PEEP:  [5 cmH20] 5 cmH20 Pressure Support:  [10 cmH20] 10 cmH20 Plateau Pressure:  [20 cmH20] 20 cmH20  CBC    Component Value Date/Time   WBC 18.4 (H) 08/27/2020 0025   RBC 2.20 (L) 08/27/2020 0025   HGB 6.5 (L) 08/27/2020 0025   HCT 18.2 (L) 08/27/2020 0025   PLT 189 08/27/2020 0025   MCV 82.7 08/27/2020 0025   MCH 29.5 08/27/2020 0025   MCHC 35.7 08/27/2020 0025   RDW 19.9 (H) 08/27/2020 0025   LYMPHSABS 0.4 (L) 08/25/2020 1217   MONOABS 0.5 08/25/2020 1217   EOSABS 0.0 08/25/2020 1217   BASOSABS 0.0 08/25/2020 1217   BMP Latest Ref Rng & Units 08/27/2020 08/26/2020 08/25/2020  Glucose 70 - 99 mg/dL 94 223(H) 226(H)  BUN 8 - 23 mg/dL 177(H) 206(H) 166(H)  Creatinine 0.61 - 1.24 mg/dL 5.90(H) 6.94(H) 6.03(H)  BUN/Creat Ratio 10 - 24 - - -  Sodium 135 - 145 mmol/L 133(L) 131(L) 132(L)  Potassium 3.5 - 5.1 mmol/L 4.0 5.0 5.2(H)  Chloride 98 - 111 mmol/L 93(L) 93(L) 92(L)  CO2 22 - 32 mmol/L 18(L) 17(L) 20(L)  Calcium 8.9 - 10.3 mg/dL 6.1(LL) 6.2(LL) 6.6(L)      BP 124/63   Pulse 83   Temp 99.32 F (37.4 C)   Resp (!) 30   Ht 6' 3.98" (1.93 m)   Wt 112 kg   SpO2 100%   BMI 30.07 kg/m    I/O last 3 completed shifts: In: 1044.6 [I.V.:434.6; NG/GT:460;   IV Piggyback:150.1] Out: 1478 [Urine:175; Stool:1200] No intake/output data recorded.  SpO2: 100 % FiO2 (%): 40 %  Estimated body mass index is 30.07 kg/m as calculated from the following:   Height as of this encounter: 6' 3.98" (1.93 m).   Weight as of this encounter: 112 kg.  SIGNIFICANT EVENTS   REVIEW OF SYSTEMS  PATIENT IS UNABLE TO PROVIDE COMPLETE REVIEW OF SYSTEMS DUE TO SEVERE CRITICAL ILLNESS   Pressure Injury  08/15/20 Sacrum Medial;Lower Deep Tissue Pressure Injury - Purple or maroon localized area of discolored intact skin or blood-filled blister due to damage of underlying soft tissue from pressure and/or shear. bruised looking non-blanchabl (Active)  08/15/20 1200  Location: Sacrum  Location Orientation: Medial;Lower  Staging: Deep Tissue Pressure Injury - Purple or maroon localized area of discolored intact skin or blood-filled blister due to damage of underlying soft tissue from pressure and/or shear.  Wound Description (Comments): bruised looking non-blanchable purple on soft tissue, Serous blisters bilaterally  Present on Admission:      PHYSICAL EXAMINATION: GENERAL:critically ill appearing, +resp distress NECK: Supple.  PULMONARY: +rhonchi, +wheezing CARDIOVASCULAR: S1 and S2.  GASTROINTESTINAL: Soft, nontender, +Positive bowel sounds.  MUSCULOSKELETAL: No swelling, clubbing, or edema.  NEUROLOGIC: obtunded, GCS<8 SKIN:intact,warm,dry    Patient assessed or the symptoms described in the history of present illness.  In the context of the Global COVID-19 pandemic, which necessitated consideration that the patient might be at risk for infection with the SARS-CoV-2 virus that causes COVID-19, Institutional protocols and algorithms that pertain to the evaluation of patients at risk for COVID-19 are in a state of rapid change based on information released by regulatory bodies including the CDC and federal and state organizations. These policies and algorithms were followed during the patient's care while in hospital.    MEDICATIONS: I have reviewed all medications and confirmed regimen as documented   CULTURE RESULTS   Recent Results (from the past 240 hour(s))  Culture, respiratory     Status: None   Collection Time: 08/17/20  3:15 PM   Specimen: Tracheal Aspirate; Respiratory  Result Value Ref Range Status   Specimen Description   Final    TRACHEAL ASPIRATE Performed at  [facility], [address], [place], [place] [address]    Special Requests   Final    NONE Performed at [facility], [place], [place] [address]    Gram Stain NO WBC SEEN MODERATE GRAM VARIABLE ROD   Final   Culture   Final    ABUNDANT SERRATIA MARCESCENS SUSCEPTIBILITIES PERFORMED ON PREVIOUS CULTURE WITHIN THE LAST 5 DAYS. Performed at [facility], [place] [address], [place], [place] [address]    Report Status 08/20/2020 FINAL  Final  Culture, respiratory     Status: None   Collection Time: 08/18/20 11:09 AM   Specimen: Tracheal Aspirate; Respiratory  Result Value Ref Range Status   Specimen Description   Final    TRACHEAL ASPIRATE Performed at [facility], [address] [place], [place] [address]    Special Requests   Final    NONE Performed at [facility], [place], [place] [address]    Gram Stain   Final    MODERATE WBC PRESENT, PREDOMINANTLY PMN MODERATE GRAM NEGATIVE RODS FEW GRAM VARIABLE ROD RARE GRAM POSITIVE COCCI Performed at [facility], [place] [address] [place], [place] [address]    Culture [facility] MARCESCENS  Final   Report Status  08/20/2020 FINAL  Final   Organism ID, Bacteria SERRATIA MARCESCENS  Final      Susceptibility   Serratia marcescens - MIC*    CEFAZOLIN >=64 RESISTANT Resistant     CEFEPIME <=0.12 SENSITIVE Sensitive     CEFTAZIDIME <=1 SENSITIVE Sensitive     CEFTRIAXONE <=0.25 SENSITIVE Sensitive     CIPROFLOXACIN <=0.25 SENSITIVE Sensitive     GENTAMICIN <=1 SENSITIVE Sensitive     TRIMETH/SULFA <=20 SENSITIVE Sensitive     * ABUNDANT SERRATIA MARCESCENS  MRSA PCR Screening     Status: None   Collection Time: 08/18/20 12:52 PM   Specimen: Nasopharyngeal  Result Value Ref Range Status   MRSA by PCR NEGATIVE NEGATIVE Final    Comment:        The GeneXpert MRSA Assay (FDA approved for NASAL specimens only), is one component of  a comprehensive MRSA colonization surveillance program. It is not intended to diagnose MRSA infection nor to guide or monitor treatment for MRSA infections. Performed at [facility], [address] [place], [place] [address]   Culture, respiratory (non-expectorated)     Status: None   Collection Time: 08/21/20  4:52 PM   Specimen: Tracheal Aspirate; Respiratory  Result Value Ref Range Status   Specimen Description   Final    TRACHEAL ASPIRATE Performed at [facility], [address] [place], [place] [address]    Special Requests   Final    NONE Performed at [facility], [address] [place], [place] [address]    Gram Stain   Final    FEW WBC PRESENT, PREDOMINANTLY PMN ABUNDANT GRAM VARIABLE ROD MODERATE GRAM POSITIVE COCCI Performed at [facility], [place] [address], [place], [place] [address]    Culture ABUNDANT SERRATIA MARCESCENS  Final   Report Status 08/24/2020 FINAL  Final   Organism ID, Bacteria SERRATIA MARCESCENS  Final      Susceptibility   Serratia marcescens - MIC*    CEFAZOLIN >=64 RESISTANT Resistant     CEFEPIME <=0.12 SENSITIVE Sensitive     CEFTAZIDIME <=1 SENSITIVE Sensitive     CEFTRIAXONE <=0.25 SENSITIVE Sensitive     CIPROFLOXACIN <=0.25 SENSITIVE Sensitive     GENTAMICIN <=1 SENSITIVE Sensitive     TRIMETH/SULFA <=20 SENSITIVE Sensitive     * ABUNDANT SERRATIA MARCESCENS  CULTURE, BLOOD (ROUTINE X 2) w Reflex to ID Panel     Status: None   Collection Time: 08/22/20  1:37 PM   Specimen: BLOOD  Result Value Ref Range Status   Specimen Description BLOOD BLOOD LEFT HAND  Final   Special Requests   Final    BOTTLES DRAWN AEROBIC AND ANAEROBIC Blood Culture adequate volume   Culture   Final    NO GROWTH 5 DAYS Performed at [facility], [address] [place], [place] [address]    Report Status 08/27/2020 FINAL  Final  CULTURE, BLOOD (ROUTINE X 2) w Reflex to ID Panel     Status: None    Collection Time: 08/22/20  1:37 PM   Specimen: BLOOD  Result Value Ref Range Status   Specimen Description BLOOD LEFT ANTECUBITAL  Final   Special Requests   Final    BOTTLES DRAWN AEROBIC AND ANAEROBIC Blood Culture adequate volume   Culture   Final    NO GROWTH 5 DAYS Performed at [facility], [address], [place], [place] [address]    Report Status 08/27/2020 FINAL  Final  IMAGING    MR BRAIN WO CONTRAST  Result Date: 08/26/2020 CLINICAL DATA:  Initial evaluation for anoxic brain injury. EXAM: MRI HEAD WITHOUT CONTRAST TECHNIQUE: Multiplanar, multiecho pulse sequences of the brain and surrounding structures were obtained without intravenous contrast. COMPARISON:  Prior CT from 08/23/2020. FINDINGS: Brain: Cerebral volume within normal limits for age. Mild chronic microvascular ischemic disease noted involving the periventricular and deep white matter both cerebral hemispheres. No abnormal foci of restricted diffusion to suggest acute or subacute ischemia. Gray-white matter differentiation maintained. No signal changes to suggest anoxic brain injury. No encephalomalacia to suggest chronic cortical infarction. No acute intracranial hemorrhage. Innumerable scattered chronic micro hemorrhages seen involving the bilateral cerebral and cerebellar hemispheres, predominantly peripheral in location, and most suggestive of underlying cerebral amyloid angiopathy. No mass lesion, midline shift or mass effect. No hydrocephalus or extra-axial fluid collection. Pituitary gland suprasellar region normal. Midline structures intact. Vascular: Major intracranial vascular flow voids are maintained. Skull and upper cervical spine: Craniocervical junction within normal limits. Bone marrow signal intensity normal. No scalp soft tissue abnormality. Sinuses/Orbits: Globes and orbital soft tissues demonstrate no acute finding. Patient status post ocular lens replacement on the left. Paranasal  sinuses are largely clear. Layering fluid seen within the nasopharynx. Large bilateral mastoid effusions noted. Patient is intubated. Other: None. IMPRESSION: 1. No acute intracranial abnormality. No evidence for anoxic brain injury. 2. Innumerable scattered chronic micro hemorrhages involving the bilateral cerebral and cerebellar hemispheres, predominantly peripheral in location, and most suggestive of underlying cerebral amyloid angiopathy. 3. Mild chronic microvascular ischemic disease. Electronically Signed   By: Jeannine Boga M.D.   On: 08/26/2020 22:15     Nutrition Status: Nutrition Problem: Moderate Malnutrition Etiology: chronic illness (CKD) Signs/Symptoms: mild fat depletion,mild muscle depletion Interventions: Tube feeding     Indwelling Urinary Catheter continued, requirement due to   Reason to continue Indwelling Urinary Catheter strict Intake/Output monitoring for hemodynamic instability   Central Line/ continued, requirement due to  Reason to continue [place] of central venous pressure or other hemodynamic parameters and poor IV access   Ventilator continued, requirement due to severe respiratory failure   Ventilator Sedation RASS 0 to -2      ASSESSMENT AND PLAN SYNOPSIS   70 yo white male UNVACCINATED with COVID 19 infection causing gastroenteritis with severe hypoxic respiratory  failure with severe DKA with progressive renal failure with multiorgan failure with ischemic cardiomyopathy  Day 14 in ICU with abnormal MRI brain with micro hemmrohages   Severe ACUTE Hypoxic and Hypercapnic Respiratory Failure -continue Full MV support -continue Bronchodilator Therapy -Wean Fio2 and PEEP as tolerated -VAP/VENT bundle implementation Will need TRACH for Survival  ACUTE SYSTOLIC CARDIAC FAILURE- EF ISCHEMIC CARDIOMYOPATHY -oxygen as needed -Lasix as tolerated -follow up cardiac enzymes as indicated -follow up cardiology recs   Morbid  obesity, possible OSA.   Will certainly impact respiratory mechanics, ventilator weaning Suspect will need to consider additional PEEP  ACUTE KIDNEY INJURY/Renal Failure -continue Foley Catheter-assess need -Avoid nephrotoxic agents -Follow urine output, BMP -Ensure adequate renal perfusion, optimize oxygenation -Renal dose medications HD as needed    NEUROLOGY Acute toxic metabolic encephalopathy,  Goal RASS -2 to -3 ABNORMAL MRI NEED NEUROLOGY CONSULTATION   CARDIAC ICU monitoring  ID -continue IV abx as prescibed -follow up cultures  GI GI PROPHYLAXIS as indicated   DIET-->TF's as tolerated Constipation protocol as indicated  ENDO - will use ICU hypoglycemic\Hyperglycemia protocol if indicated     ELECTROLYTES -follow labs as needed -  replace as needed -pharmacy consultation and following   DVT/GI PRX ordered and assessed TRANSFUSIONS AS NEEDED MONITOR FSBS I Assessed the need for Labs I Assessed the need for Foley I Assessed the need for Central Venous Line Family Discussion when available I Assessed the need for Mobilization I made an Assessment of medications to be adjusted accordingly Safety Risk assessment completed   CASE DISCUSSED IN MULTIDISCIPLINARY ROUNDS WITH ICU TEAM  Critical Care Time devoted to patient care services described in this note is 45  minutes.   Overall, patient is critically ill, prognosis is guarded.  Patient with Multiorgan failure and at high risk for cardiac arrest and death.    Corrin Parker, M.D.  [facility]  Medical Director [place] Director [facility] Cardio-Pulmonary Department

## 2020-08-27 NOTE — Progress Notes (Signed)
Nutrition Follow-up  DOCUMENTATION CODES:   Non-severe (moderate) malnutrition in context of chronic illness  INTERVENTION:  Initiate new goal regimen of Nepro at 55 mL/hr (1320 mL goal daily volume) + PROSource TF 90 mL BID per tube. Provides 2536 kcal, 151 grams of protoein, 964 mL H2O daily.  Continue Rena-vit QHS per tube to replace losses from HD.  Provide nutrisource fiber per tube BID.  NUTRITION DIAGNOSIS:   Moderate Malnutrition related to chronic illness (CKD) as evidenced by mild fat depletion,mild muscle depletion.  Ongoing.  GOAL:   Patient will meet greater than or equal to 90% of their needs  Met with TF regimen.  MONITOR:   Vent status,Labs,Weight trends,TF tolerance,I & O's  REASON FOR ASSESSMENT:   Ventilator,Consult Enteral/tube feeding initiation and management  ASSESSMENT:   70 year old male with PMHx of DM, HTN, HLD, glaucoma, CAD, CKD recently diagnosed with COVID-19 admitted with DKA, gastroenteritis.  1/10 intubated 1/11 transferred to ICU from ED 1/23 MRI with innumerable scattered chronic micro-hemorrhages involving bilateral cerebral and cerebellar hemispheres most suggestive of underlying cerebral amyloid angiopathy  Patient is currently intubated on ventilator support MV: 18.4 L/min Temp (24hrs), Avg:98.6 F (37 C), Min:96.98 F (36.1 C), Max:99.32 F (37.4 C)  Medications reviewed and include: Colace 100 mg BID, Novolog 0-20 units Q4hrs, Levemir 20 units BID, Rena-vit QHS, Miralax, cefepime, famotidine.  Labs reviewed: CBG 81-87, Sodium 133, Chloride 93, CO2 18, BUN 177, Creatinine 5.9, Phoshporus 10.5.  I/O: 175 mL UOP yesterday (0.1 mL/kg/hr); 1200 mL output from rectal tube  Weight trend: 112 kg on 1/24; +21.3 kg from 1/10  Enteral Access: 16 Fr. OGT placed 1/10; terminates over proximal stomach per abdominal x-ray 1/18; per NP 1/18 tube okay to use  TF regimen: Nepro at 50 mL/hr + PROsource TF 90 mL BID  Discussed on  rounds. Plan for neurology consult.  Diet Order:   Diet Order            Diet NPO time specified  Diet effective now                EDUCATION NEEDS:   No education needs have been identified at this time  Skin:  Skin Assessment: Skin Integrity Issues: Skin Integrity Issues:: DTI DTI: sacrum (12cm x 6cm)  Last BM:  08/26/2020 - large type 7 per rectal tube  Height:   Ht Readings from Last 1 Encounters:  08/24/20 6' 3.98" (1.93 m)   Weight:   Wt Readings from Last 1 Encounters:  08/27/20 112 kg   Ideal Body Weight:  91.8 kg  BMI:  Body mass index is 30.07 kg/m.  Estimated Nutritional Needs:   Kcal:  2636  Protein:  135-145 grams  Fluid:  2.2 L/day  Jacklynn Barnacle, MS, RD, LDN Pager number available on Amion

## 2020-08-28 DIAGNOSIS — G928 Other toxic encephalopathy: Secondary | ICD-10-CM

## 2020-08-28 LAB — GLUCOSE, CAPILLARY
Glucose-Capillary: 80 mg/dL (ref 70–99)
Glucose-Capillary: 87 mg/dL (ref 70–99)
Glucose-Capillary: 85 mg/dL (ref 70–99)
Glucose-Capillary: 88 mg/dL (ref 70–99)
Glucose-Capillary: 69 mg/dL — ABNORMAL LOW (ref 70–99)
Glucose-Capillary: 86 mg/dL (ref 70–99)

## 2020-08-28 LAB — PROTIME-INR
INR: 1.3 — ABNORMAL HIGH (ref 0.8–1.2)
Prothrombin Time: 15.7 seconds — ABNORMAL HIGH (ref 11.4–15.2)

## 2020-08-28 LAB — HEMOGLOBIN AND HEMATOCRIT, BLOOD
HCT: 22.2 % — ABNORMAL LOW (ref 39.0–52.0)
HCT: 23.5 % — ABNORMAL LOW (ref 39.0–52.0)
HCT: 23.6 % — ABNORMAL LOW (ref 39.0–52.0)
HCT: 27.4 % — ABNORMAL LOW (ref 39.0–52.0)
Hemoglobin: 7.5 g/dL — ABNORMAL LOW (ref 13.0–17.0)
Hemoglobin: 8 g/dL — ABNORMAL LOW (ref 13.0–17.0)
Hemoglobin: 8.1 g/dL — ABNORMAL LOW (ref 13.0–17.0)
Hemoglobin: 9.6 g/dL — ABNORMAL LOW (ref 13.0–17.0)

## 2020-08-28 LAB — RENAL FUNCTION PANEL
Albumin: 1.3 g/dL — ABNORMAL LOW (ref 3.5–5.0)
Anion gap: 21 — ABNORMAL HIGH (ref 5–15)
BUN: 191 mg/dL — ABNORMAL HIGH (ref 8–23)
CO2: 18 mmol/L — ABNORMAL LOW (ref 22–32)
Calcium: 5.8 mg/dL — CL (ref 8.9–10.3)
Chloride: 94 mmol/L — ABNORMAL LOW (ref 98–111)
Creatinine, Ser: 6.8 mg/dL — ABNORMAL HIGH (ref 0.61–1.24)
GFR, Estimated: 8 mL/min — ABNORMAL LOW (ref >60)
Glucose, Bld: 98 mg/dL (ref 70–99)
Phosphorus: 16.9 mg/dL — ABNORMAL HIGH (ref 2.5–4.6)
Potassium: 4 mmol/L (ref 3.5–5.1)
Sodium: 133 mmol/L — ABNORMAL LOW (ref 135–145)

## 2020-08-28 LAB — CBC WITH DIFFERENTIAL/PLATELET
Abs Immature Granulocytes: 0.32 10*3/uL — ABNORMAL HIGH (ref 0.00–0.07)
Basophils Absolute: 0.1 10*3/uL (ref 0.0–0.1)
Basophils Relative: 0 %
Eosinophils Absolute: 0.1 10*3/uL (ref 0.0–0.5)
Eosinophils Relative: 0 %
HCT: 23 % — ABNORMAL LOW (ref 39.0–52.0)
Hemoglobin: 7.8 g/dL — ABNORMAL LOW (ref 13.0–17.0)
Immature Granulocytes: 2 %
Lymphocytes Relative: 2 %
Lymphs Abs: 0.5 10*3/uL — ABNORMAL LOW (ref 0.7–4.0)
MCH: 29.5 pg (ref 26.0–34.0)
MCHC: 33.9 g/dL (ref 30.0–36.0)
MCV: 87.1 fL (ref 80.0–100.0)
Monocytes Absolute: 0.7 10*3/uL (ref 0.1–1.0)
Monocytes Relative: 3 %
Neutro Abs: 18.4 10*3/uL — ABNORMAL HIGH (ref 1.7–7.7)
Neutrophils Relative %: 93 %
Platelets: 199 10*3/uL (ref 150–400)
RBC: 2.64 MIL/uL — ABNORMAL LOW (ref 4.22–5.81)
RDW: 18.3 % — ABNORMAL HIGH (ref 11.5–15.5)
Smear Review: NORMAL
WBC: 19.9 10*3/uL — ABNORMAL HIGH (ref 4.0–10.5)
nRBC: 0 % (ref 0.0–0.2)

## 2020-08-28 LAB — MAGNESIUM: Magnesium: 2.3 mg/dL (ref 1.7–2.4)

## 2020-08-28 LAB — APTT: aPTT: 34 seconds (ref 24–36)

## 2020-08-28 MED ORDER — DEXTROSE 50 % IV SOLN
12.5000 g | INTRAVENOUS | Status: AC
  Administered 2020-08-28: 12.5 g via INTRAVENOUS

## 2020-08-28 NOTE — Progress Notes (Signed)
[facility]  ROUNDING NOTE   Subjective:   [name] admitted to [facility] on 9/37/1696 for Metabolic acidosis [V89.3] Encounter for central line placement [Z45.2] Acute renal failure (ARF) ([place]) [N17.9] Acute respiratory failure with hypoxia ([place]) [J96.01] Sepsis, due to unspecified organism, unspecified whether acute organ dysfunction present ([place]) [A41.9] COVID-19 [U07.1]   Patient presented to the ER via EMS from home for altered mental status.  Recently treated for COVID. History taken from daughter on the phone and wife at bedside. Patient is unvaccinated.   Started on dialysis on 1/13. Intermittent treatments on 1/13, 1/15, 1/18, 1/20 and 1/23. Seen on hemodialysis today.   Furosemide 162m x 1 - UOP 2746m   Objective:  Vital signs in last 24 hours:  Temp:  [96.98 F (36.1 C)-99.32 F (37.4 C)] 98.24 F (36.8 C) (01/25 0600) Pulse Rate:  [76-88] 76 (01/25 0600) Resp:  [19-35] 28 (01/25 0600) BP: (122-141)/(53-82) 134/56 (01/25 0600) SpO2:  [98 %-100 %] 100 % (01/25 0600) FiO2 (%):  [35 %] 35 % (01/25 0332) Weight:  [112.6 kg] 112.6 kg (01/25 0420)  Weight change: 0.6 kg Filed Weights   08/26/20 0232 08/27/20 0500 08/28/20 0420  Weight: 111.5 kg 112 kg 112.6 kg    Intake/Output: I/O last 3 completed shifts: In: 937.8 [I.V.:59.4; Blood:380; NG/GT:407; IV Piggyback:91.5] Out: 2830 [Urine:330; StYBOFB:5102] Intake/Output this shift:  No intake/output data recorded.  Physical Exam: General: Critically ill   Head: ETT  Eyes: Eyes closed  Neck: trachea midline  Lungs:  PRVC FiO2 35%, diminished bilaterally  Heart: Regular rate and rhythm  Abdomen:  Soft, +bowel sounds  Extremities:  + peripheral edema.  Neurologic: Intubated and sedate  Skin: No lesions  Access: Right femoral temp HD catheter 08/09/83  Basic Metabolic Panel: Recent Labs  Lab 08/24/20 0530 08/25/20 0304 08/26/20 0433 08/27/20 0025 08/27/20 1157 08/28/20 0526  NA 132*  132* 131* 133*  --  133*  K 5.1 5.2* 5.0 4.0 5.1 4.0  CL 94* 92* 93* 93*  --  94*  CO2 20* 20* 17* 18*  --  18*  GLUCOSE 138* 226* 223* 94  --  98  BUN 137* 166* 206* 177*  --  191*  CREATININE 5.23* 6.03* 6.94* 5.90*  --  6.80*  CALCIUM 7.3* 6.6* 6.2* 6.1*  --  5.8*  MG 2.1 2.3 2.7* 2.3  --  2.3  PHOS 7.8* 9.4*  10.0* 11.0*  11.9* 10.5*  --  16.9*    Liver Function Tests: Recent Labs  Lab 08/22/20 0352 08/22/20 1337 08/24/20 0530 08/25/20 0304 08/26/20 0433 08/27/20 0025 08/28/20 0526  AST 158*  --   --   --   --   --   --   ALT 166*  --   --   --   --   --   --   ALKPHOS 316*  --   --   --   --   --   --   BILITOT 1.1  --   --   --   --   --   --   PROT 5.3*  --   --   --   --   --   --   ALBUMIN 1.5*  1.5*   < > 1.4* 1.3* 1.2* 1.3* 1.3*   < > = values in this interval not displayed.   No results for input(s): LIPASE, AMYLASE in the last 168 hours. No results for  input(s): AMMONIA in the last 168 hours.  CBC: Recent Labs  Lab 08/25/20 0827 08/25/20 1217 08/26/20 0433 08/27/20 0025 08/27/20 1823 08/28/20 0215 08/28/20 0526  WBC 22.2* 24.3* 19.2* 18.4*  --   --  19.9*  NEUTROABS  --  23.0*  --   --   --   --  18.4*  HGB 7.0* 7.2* 6.9* 6.5* 6.8* 7.5* 7.8*  HCT RESULTS UNAVAILABLE DUE TO INTERFERING SUBSTANCE 19.4* 18.8* 18.2* 19.9* 22.2* 23.0*  MCV RESULTS UNAVAILABLE DUE TO INTERFERING SUBSTANCE 81.5 83.2 82.7  --   --  87.1  PLT 139* 160 164 189  --   --  199    Cardiac Enzymes: No results for input(s): CKTOTAL, CKMB, CKMBINDEX, TROPONINI in the last 168 hours.  BNP: Invalid input(s): POCBNP  CBG: Recent Labs  Lab 08/27/20 1741 08/27/20 1927 08/27/20 2356 08/28/20 0359 08/28/20 0743  GLUCAP 91 90 79 80 87    Microbiology: Results for orders placed or performed during the hospital encounter of 08/12/2020  Blood Culture (routine x 2)     Status: None   Collection Time: 08/10/2020  9:50 AM   Specimen: BLOOD  Result Value Ref Range Status    Specimen Description BLOOD LEFT [facility]  Final   Special Requests   Final    BOTTLES DRAWN AEROBIC AND ANAEROBIC Blood Culture adequate volume   Culture   Final    NO GROWTH 5 DAYS Performed at [facility], [address] [place], [place] [address]    Report Status 08/18/2020 FINAL  Final  Blood Culture (routine x 2)     Status: None   Collection Time: 08/23/2020  9:50 AM   Specimen: BLOOD  Result Value Ref Range Status   Specimen Description BLOOD RIGHT AC  Final   Special Requests   Final    BOTTLES DRAWN AEROBIC AND ANAEROBIC Blood Culture results may not be optimal due to an excessive volume of blood received in culture bottles   Culture   Final    NO GROWTH 5 DAYS Performed at [facility], [address], [place], [place] [address]    Report Status 08/18/2020 FINAL  Final  Urine culture     Status: None   Collection Time: 08/21/2020 11:36 AM   Specimen: Urine, Random  Result Value Ref Range Status   Specimen Description   Final    URINE, RANDOM Performed at [facility], [address], [place], [place] [address]    Special Requests   Final    NONE Performed at [facility], [address], [place], [place] [address]    Culture   Final    NO GROWTH Performed at [facility], [place] [address], [place], [place] [address]    Report Status 08/14/2020 FINAL  Final  Culture, respiratory     Status: None   Collection Time: 08/17/20  3:15 PM   Specimen: Tracheal Aspirate; Respiratory  Result Value Ref Range Status   Specimen Description   Final    TRACHEAL ASPIRATE Performed at [facility], [address], [place], [place] [address]    Special Requests   Final    NONE Performed at [facility], [place], [place] [address]    Gram Stain NO WBC SEEN MODERATE GRAM VARIABLE ROD   Final   Culture   Final    ABUNDANT SERRATIA MARCESCENS SUSCEPTIBILITIES PERFORMED ON PREVIOUS CULTURE WITHIN THE LAST  5 DAYS. Performed at  [facility], [place] [address], [place], [place] [address]    Report Status 08/20/2020 FINAL  Final  Culture, respiratory     Status: None   Collection Time: 08/18/20 11:09 AM   Specimen: Tracheal Aspirate; Respiratory  Result Value Ref Range Status   Specimen Description   Final    TRACHEAL ASPIRATE Performed at [facility], [address] [place], [place] [address]    Special Requests   Final    NONE Performed at [facility], [place], [place] [address]    Gram Stain   Final    MODERATE WBC PRESENT, PREDOMINANTLY PMN MODERATE GRAM NEGATIVE RODS FEW GRAM VARIABLE ROD RARE GRAM POSITIVE COCCI Performed at [facility], [place] [address], [place], [place] [address]    Culture ABUNDANT SERRATIA MARCESCENS  Final   Report Status 08/20/2020 FINAL  Final   Organism ID, Bacteria SERRATIA MARCESCENS  Final      Susceptibility   Serratia marcescens - MIC*    CEFAZOLIN >=64 RESISTANT Resistant     CEFEPIME <=0.12 SENSITIVE Sensitive     CEFTAZIDIME <=1 SENSITIVE Sensitive     CEFTRIAXONE <=0.25 SENSITIVE Sensitive     CIPROFLOXACIN <=0.25 SENSITIVE Sensitive     GENTAMICIN <=1 SENSITIVE Sensitive     TRIMETH/SULFA <=20 SENSITIVE Sensitive     * ABUNDANT SERRATIA MARCESCENS  MRSA PCR Screening     Status: None   Collection Time: 08/18/20 12:52 PM   Specimen: Nasopharyngeal  Result Value Ref Range Status   MRSA by PCR NEGATIVE NEGATIVE Final    Comment:        The GeneXpert MRSA Assay (FDA approved for NASAL specimens only), is one component of a comprehensive MRSA colonization surveillance program. It is not intended to diagnose MRSA infection nor to guide or monitor treatment for MRSA infections. Performed at [facility], [address] [place], [place] [address]   Culture, respiratory (non-expectorated)     Status: None   Collection Time: 08/21/20  4:52 PM   Specimen: Tracheal Aspirate;  Respiratory  Result Value Ref Range Status   Specimen Description   Final    TRACHEAL ASPIRATE Performed at [facility], [address] [place], [place] [address]    Special Requests   Final    NONE Performed at [facility], [address] [place], [place] [address]    Gram Stain   Final    FEW WBC PRESENT, PREDOMINANTLY PMN ABUNDANT GRAM VARIABLE ROD MODERATE GRAM POSITIVE COCCI Performed at [facility], [place] [address] [place], [place] [address]    Culture ABUNDANT SERRATIA MARCESCENS  Final   Report Status 08/24/2020 FINAL  Final   Organism ID, Bacteria SERRATIA MARCESCENS  Final      Susceptibility   Serratia marcescens - MIC*    CEFAZOLIN >=64 RESISTANT Resistant     CEFEPIME <=0.12 SENSITIVE Sensitive     CEFTAZIDIME <=1 SENSITIVE Sensitive     CEFTRIAXONE <=0.25 SENSITIVE Sensitive     CIPROFLOXACIN <=0.25 SENSITIVE Sensitive     GENTAMICIN <=1 SENSITIVE Sensitive     TRIMETH/SULFA <=20 SENSITIVE Sensitive     * ABUNDANT SERRATIA MARCESCENS  CULTURE, BLOOD (ROUTINE X 2) w Reflex to ID Panel     Status: None   Collection Time: 08/22/20  1:37 PM   Specimen: BLOOD  Result Value Ref Range Status   Specimen Description BLOOD BLOOD LEFT HAND  Final   Special Requests  Final    BOTTLES DRAWN AEROBIC AND ANAEROBIC Blood Culture adequate volume   Culture   Final    NO GROWTH 5 DAYS Performed at [facility], [address] [place], [place] [address]    Report Status 08/27/2020 FINAL  Final  CULTURE, BLOOD (ROUTINE X 2) w Reflex to ID Panel     Status: None   Collection Time: 08/22/20  1:37 PM   Specimen: BLOOD  Result Value Ref Range Status   Specimen Description BLOOD LEFT ANTECUBITAL  Final   Special Requests   Final    BOTTLES DRAWN AEROBIC AND ANAEROBIC Blood Culture adequate volume   Culture   Final    NO GROWTH 5 DAYS Performed at [facility], [address], [place], [place] [address]    Report Status  08/27/2020 FINAL  Final    Coagulation Studies: Recent Labs    08/28/20 0526  LABPROT 15.7*  INR 1.3*    Urinalysis: No results for input(s): COLORURINE, LABSPEC, PHURINE, GLUCOSEU, HGBUR, BILIRUBINUR, KETONESUR, PROTEINUR, UROBILINOGEN, NITRITE, LEUKOCYTESUR in the last 72 hours.  Invalid input(s): APPERANCEUR    Imaging: MR BRAIN WO CONTRAST  Result Date: 08/26/2020 CLINICAL DATA:  Initial evaluation for anoxic brain injury. EXAM: MRI HEAD WITHOUT CONTRAST TECHNIQUE: Multiplanar, multiecho pulse sequences of the brain and surrounding structures were obtained without intravenous contrast. COMPARISON:  Prior CT from 08/23/2020. FINDINGS: Brain: Cerebral volume within normal limits for age. Mild chronic microvascular ischemic disease noted involving the periventricular and deep white matter both cerebral hemispheres. No abnormal foci of restricted diffusion to suggest acute or subacute ischemia. Gray-white matter differentiation maintained. No signal changes to suggest anoxic brain injury. No encephalomalacia to suggest chronic cortical infarction. No acute intracranial hemorrhage. Innumerable scattered chronic micro hemorrhages seen involving the bilateral cerebral and cerebellar hemispheres, predominantly peripheral in location, and most suggestive of underlying cerebral amyloid angiopathy. No mass lesion, midline shift or mass effect. No hydrocephalus or extra-axial fluid collection. Pituitary gland suprasellar region normal. Midline structures intact. Vascular: Major intracranial vascular flow voids are maintained. Skull and upper cervical spine: Craniocervical junction within normal limits. Bone marrow signal intensity normal. No scalp soft tissue abnormality. Sinuses/Orbits: Globes and orbital soft tissues demonstrate no acute finding. Patient status post ocular lens replacement on the left. Paranasal sinuses are largely clear. Layering fluid seen within the nasopharynx. Large bilateral  mastoid effusions noted. Patient is intubated. Other: None. IMPRESSION: 1. No acute intracranial abnormality. No evidence for anoxic brain injury. 2. Innumerable scattered chronic micro hemorrhages involving the bilateral cerebral and cerebellar hemispheres, predominantly peripheral in location, and most suggestive of underlying cerebral amyloid angiopathy. 3. Mild chronic microvascular ischemic disease. Electronically Signed   By: Jeannine Boga M.D.   On: 08/26/2020 22:15     Medications:   . sodium chloride    . famotidine (PEPCID) IV Stopped (08/27/20 1953)  . feeding supplement (NEPRO CARB STEADY) 1,000 mL (08/27/20 1142)  . fentaNYL infusion INTRAVENOUS Stopped (08/25/20 0846)   . amiodarone  200 mg Per Tube BID  . chlorhexidine gluconate (MEDLINE KIT)  15 mL Mouth Rinse BID  . Chlorhexidine Gluconate Cloth  6 each Topical Daily  . docusate  100 mg Per Tube BID  . feeding supplement (PROSource TF)  90 mL Per Tube BID  . fiber  1 packet Per Tube BID  . insulin aspart  0-20 Units Subcutaneous Q4H  . insulin detemir  20 Units Subcutaneous BID  . mouth rinse  15 mL Mouth Rinse  10 times per day  . metoprolol tartrate  12.5 mg Per NG tube BID  . multivitamin  1 tablet Per Tube QHS  . polyethylene glycol  17 g Per Tube Daily  . sodium chloride flush  10-40 mL Intracatheter Q12H  . sodium chloride flush  3 mL Intravenous Q12H  . sodium zirconium cyclosilicate  10 g Per Tube TID   sodium chloride, acetaminophen (TYLENOL) oral liquid 160 mg/5 mL, docusate, fentaNYL, heparin, metoprolol tartrate, midazolam, sodium chloride flush, sodium chloride flush  Assessment/ Plan:  [name] is a 70 y.o. white male with coronary artery disease, diabetes, glaucoma, history of kidney stones, hyperlipidemia, hypertension, left knee replacement, lithotripsy, basal cell carcinoma, was admitted on 4/48/1856 with Metabolic acidosis [D14.9] Encounter for central line placement [Z45.2] Acute  renal failure (ARF) ([place]) [N17.9] Acute respiratory failure with hypoxia ([place]) [J96.01] Sepsis, due to unspecified organism, unspecified whether acute organ dysfunction present ([place]) [A41.9] COVID-19 [U07.1]   #Acute kidney injury Baseline creatinine 1.0 from August 02, 2020 Presenting creatinine of 2.15 Recent Labs       Lab Results  Component Value Date   CREATININE 5.23 (H) 08/24/2020   CREATININE 6.33 (H) 08/23/2020   CREATININE 6.15 (H) 08/23/2020     Oliguric - UOP 275 despite high dose furosemide.  Plan on hemodialysis treatment today. Will plan on ultrafiltration. Orders prepared.   #Acute respiratory failure Requiring ventilator support.  Intubated 08/14/2020  #COVID-19 pneumonia Treated with ivermectin as outpatient  #Diabetes type 2, insulin dependent: DKA on admission Outpatient regimen of metformin, Jardiance (empagliflozin), Amaryl as outpatient Hemoglobin A1c 7.9% from June 26, 2020 Recent Labs       Lab Results  Component Value Date   HGBA1C 7.5 70/26/3785      #Metabolic acidosis/hyperkalemia Treated with dialysis and Lokelma  #Anemia with kidney failure: multiple PRBC transfusions this admission - EPO will need to be cleared with neurology before initiating.   #Secondary Hyperparathyroidism with hypocalcemia and hyperphospahtemia: critically elevated phosphorus.    Overall prognosis is poor. Discussed with wife and daughter over the phone.    LOS: [place] 1/25/20228:13 AM

## 2020-08-28 NOTE — Progress Notes (Signed)
Neurology Progress Note   S:// Seen and examined. No acute changes overnight. Wife reports that when she put on some music he had some shoulder twitching. RN reports questionable biting on the tube when manipulating the ET tube/performing oral care. No coughing and gagging when suctioning.   O:// Current vital signs: BP (!) 119/59   Pulse 93   Temp 98.2 F (36.8 C)   Resp (!) 32   Ht 6' 3.98" (1.93 m)   Wt 112.6 kg   SpO2 96%   BMI 30.23 kg/m  Vital signs in last 24 hours: Temp:  [98.2 F (36.8 C)-98.96 F (37.2 C)] 98.2 F (36.8 C) (01/25 1230) Pulse Rate:  [76-93] 93 (01/25 1230) Resp:  [19-35] 32 (01/25 1230) BP: (115-141)/(53-82) 119/59 (01/25 1230) SpO2:  [96 %-100 %] 96 % (01/25 1230) FiO2 (%):  [35 %] 35 % (01/25 0815) Weight:  [112.6 kg] 112.6 kg (01/25 0420) Neurological exam On no sedation Intubated No spontaneous movements Breathing with the ventilator for most part but has some breaths above the ventilator. Weak corneals present bilaterally Pupils 2 to 3 mm equally reactive To noxious stimulation, no movement noted. To noxious stimulation, no withdrawal. No cough or gag on suctioning.  General: no sedation, remains intubated. HEENT: Normocephalic atraumatic CVs: Regular rhythm Respiratory: Vented Extremities: Trace edema in all fours. Was receiving dialysis at the time of this exam.  Medications  Current Facility-Administered Medications:  .  0.9 %  sodium chloride infusion, 250 mL, Intravenous, PRN, Flora Lipps, MD .  acetaminophen (TYLENOL) 160 MG/5ML solution 650 mg, 650 mg, Per Tube, Q4H PRN, Flora Lipps, MD, 650 mg at 08/24/20 1317 .  amiodarone (PACERONE) tablet 200 mg, 200 mg, Per Tube, BID, Rust-Chester, Britton L, NP, 200 mg at 08/28/20 1122 .  chlorhexidine gluconate (MEDLINE KIT) (PERIDEX) 0.12 % solution 15 mL, 15 mL, Mouth Rinse, BID, Kasa, Kurian, MD, 15 mL at 08/28/20 0848 .  Chlorhexidine Gluconate Cloth 2 % PADS 6 each, 6 each,  Topical, Daily, Flora Lipps, MD, 6 each at 08/28/20 1122 .  docusate (COLACE) 50 MG/5ML liquid 100 mg, 100 mg, Per Tube, BID, Flora Lipps, MD, 100 mg at 08/27/20 2136 .  docusate (COLACE) 50 MG/5ML liquid 100 mg, 100 mg, Per Tube, BID PRN, Flora Lipps, MD .  famotidine (PEPCID) IVPB 20 mg premix, 20 mg, Intravenous, Q24H, Kasa, Kurian, MD, Last Rate: 100 mL/hr at 08/28/20 1100, 20 mg at 08/28/20 1100 .  feeding supplement (NEPRO CARB STEADY) liquid 1,000 mL, 1,000 mL, Per Tube, Continuous, Kasa, Kurian, MD, Last Rate: 55 mL/hr at 08/27/20 1142, 1,000 mL at 08/27/20 1142 .  feeding supplement (PROSource TF) liquid 90 mL, 90 mL, Per Tube, BID, Rosine Door, MD, 90 mL at 08/28/20 1123 .  fentaNYL (SUBLIMAZE) bolus via infusion 25 mcg, 25 mcg, Intravenous, Q15 min PRN, Flora Lipps, MD, 25 mcg at 08/20/20 0247 .  fentaNYL 2551mg in NS 2552m(1021mml) infusion-PREMIX, 0-250 mcg/hr, Intravenous, Continuous, DewFreddi StarrD, Stopped at 08/25/20 [id number] fiber (NUTRISOURCE FIBER) 1 packet, 1 packet, Per Tube, BID, KasFlora LippsD, 1 packet at 08/27/20 2140 .  heparin injection 1,000-6,000 Units, 1,000-6,000 Units, CRRT, PRN, KeeBradly BienenstockP, 2,000 Units at 08/16/20 1116 .  insulin aspart (novoLOG) injection 0-20 Units, 0-20 Units, Subcutaneous, Q4H, KeeDarel Hong NP, 3 Units at 08/26/20 1552 .  insulin detemir (LEVEMIR) injection 20 Units, 20 Units, Subcutaneous, BID, DewFreddi StarrD, 20 Units at 08/27/20 2138 .  MEDLINE  mouth rinse, 15 mL, Mouth Rinse, 10 times per day, Flora Lipps, MD, 15 mL at 08/28/20 1316 .  metoprolol tartrate (LOPRESSOR) injection 5 mg, 5 mg, Intravenous, Q6H PRN, Rust-Chester, Britton L, NP, 5 mg at 08/18/20 1528 .  metoprolol tartrate (LOPRESSOR) tablet 12.5 mg, 12.5 mg, Per NG tube, BID, Aleskerov, Fuad, MD, 12.5 mg at 08/28/20 1124 .  midazolam (VERSED) injection 1-2 mg, 1-2 mg, Intravenous, Q2H PRN, Rust-Chester, Britton L, NP .  multivitamin  (RENA-VIT) tablet 1 tablet, 1 tablet, Per Tube, Francina Ames, MD, 1 tablet at 08/27/20 2137 .  polyethylene glycol (MIRALAX / GLYCOLAX) packet 17 g, 17 g, Per Tube, Daily, Freda Jackson B, MD, 17 g at 08/24/20 1000 .  sodium chloride flush (NS) 0.9 % injection 10-40 mL, 10-40 mL, Intracatheter, Q12H, Freda Jackson B, MD, 10 mL at 08/28/20 1124 .  sodium chloride flush (NS) 0.9 % injection 10-40 mL, 10-40 mL, Intracatheter, PRN, Freda Jackson B, MD .  sodium chloride flush (NS) 0.9 % injection 3 mL, 3 mL, Intravenous, Q12H, Kasa, Kurian, MD, 3 mL at 08/28/20 1124 .  sodium chloride flush (NS) 0.9 % injection 3 mL, 3 mL, Intravenous, PRN, Flora Lipps, MD, 3 mL at 08/27/20 2139 .  sodium zirconium cyclosilicate (LOKELMA) packet 10 g, 10 g, Per Tube, TID, Freddi Starr, MD, 10 g at 08/27/20 2138 Labs CBC    Component Value Date/Time   WBC 19.9 (H) 08/28/2020 0526   RBC 2.64 (L) 08/28/2020 0526   HGB 9.6 (L) 08/28/2020 1234   HCT 27.4 (L) 08/28/2020 1234   PLT 199 08/28/2020 0526   MCV 87.1 08/28/2020 0526   MCH 29.5 08/28/2020 0526   MCHC 33.9 08/28/2020 0526   RDW 18.3 (H) 08/28/2020 0526   LYMPHSABS 0.5 (L) 08/28/2020 0526   MONOABS 0.7 08/28/2020 0526   EOSABS 0.1 08/28/2020 0526   BASOSABS 0.1 08/28/2020 0526    CMP     Component Value Date/Time   NA 133 (L) 08/28/2020 0526   NA 139 09/03/2016 1035   K 4.0 08/28/2020 0526   CL 94 (L) 08/28/2020 0526   CO2 18 (L) 08/28/2020 0526   GLUCOSE 98 08/28/2020 0526   BUN 191 (H) 08/28/2020 0526   BUN 17 09/03/2016 1035   CREATININE 6.80 (H) 08/28/2020 0526   CREATININE 0.89 01/11/2015 0833   CALCIUM 5.8 (LL) 08/28/2020 0526   PROT 5.3 (L) 08/22/2020 0352   PROT 7.1 09/03/2016 1035   ALBUMIN 1.3 (L) 08/28/2020 0526   ALBUMIN 4.5 09/03/2016 1035   AST 158 (H) 08/22/2020 0352   ALT 166 (H) 08/22/2020 0352   ALKPHOS 316 (H) 08/22/2020 0352   BILITOT 1.1 08/22/2020 0352   BILITOT 0.3 09/03/2016 1035   GFRNONAA  8 (L) 08/28/2020 0526   GFRNONAA >89 01/11/2015 0833   GFRAA 92 09/03/2016 1035   GFRAA >89 01/11/2015 0833   Imaging I have reviewed images in epic and the results pertinent to this consultation are: No new imaging since last examination  Assessment: 70 year old man currently critically ill with COVID-19 infection and complications of respiratory failure, acute renal failure, initially came in with DKA and a clinical course that was complicated by hypoglycemia following correction of hyperglycemia, acute anemia, remains unresponsive despite sedation being discontinued for over 48 hours now. Brain MRI with no acute ischemic stroke with evidence of cerebral amyloid angiopathy. Due to prolonged hypoxia/respiratory failure, cannot rule out hypoxic ischemic encephalopathy. Given the fact that he was on paralytics and  sedation for a long period of time in the setting of acute renal failure, giving him some more time prior to prognosticating might be prudent. No significant improvement in brain function and prolonged critical illness do not portend him good chances for neurological meaningful recovery, which I have communicated to the family.  Today's exam remains unchanged from yesterday.  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 I would reevaluate him tomorrow and may be able to have some more concrete discussions about prognosis given that I would have seen him at least on three separate occasions by that time and he is now nearing the time where decisions on escalation of care versus withdrawal will probably need to be made. Given his current clinical course, and multiorgan failure, I would not place a lot of hope on neurological meaningful recovery-and I  have communicated this to the family as well on two separate occasions-yesterday and today. I will follow with you again tomorrow. Discussed my plan with Dr. Mortimer Fries  -- Amie [place], MD Neurologist [facility] Pager: [phone]   CRITICAL CARE ATTESTATION Performed by: Amie [place], MD Total critical care time: 39 minutes Critical care time was exclusive of separately billable procedures and treating other patients and/or supervising APPs/Residents/Students Critical care was necessary to treat or prevent imminent or life-threatening deterioration due to multifactorial toxic metabolic encephalopathy, hypoxic ischemic encephalopathy. This patient is critically ill and at significant risk for neurological worsening and/or death and care requires constant monitoring. Critical care was time spent personally by me on the following activities: development of treatment plan with patient and/or surrogate as well as nursing, discussions with consultants, evaluation of patient's response to treatment, examination of patient, obtaining history from patient or surrogate, ordering and performing treatments and interventions, ordering and review of laboratory studies, ordering and review of radiographic studies, pulse oximetry, re-evaluation of patient's condition, participation in multidisciplinary rounds and medical decision making of high complexity in the care of this patient.

## 2020-08-29 DIAGNOSIS — G928 Other toxic encephalopathy: Secondary | ICD-10-CM | POA: Diagnosis not present

## 2020-08-29 DIAGNOSIS — J9601 Acute respiratory failure with hypoxia: Secondary | ICD-10-CM | POA: Diagnosis not present

## 2020-08-29 DIAGNOSIS — N170 Acute kidney failure with tubular necrosis: Secondary | ICD-10-CM | POA: Diagnosis not present

## 2020-08-29 DIAGNOSIS — Z7189 Other specified counseling: Secondary | ICD-10-CM | POA: Diagnosis not present

## 2020-08-29 DIAGNOSIS — E872 Acidosis: Secondary | ICD-10-CM | POA: Diagnosis not present

## 2020-08-29 DIAGNOSIS — U071 COVID-19: Secondary | ICD-10-CM | POA: Diagnosis not present

## 2020-08-29 LAB — CBC WITH DIFFERENTIAL/PLATELET
Abs Immature Granulocytes: 0.09 10*3/uL — ABNORMAL HIGH (ref 0.00–0.07)
Basophils Absolute: 0 10*3/uL (ref 0.0–0.1)
Basophils Relative: 0 %
Eosinophils Absolute: 0 10*3/uL (ref 0.0–0.5)
Eosinophils Relative: 0 %
HCT: 22.4 % — ABNORMAL LOW (ref 39.0–52.0)
Hemoglobin: 7.8 g/dL — ABNORMAL LOW (ref 13.0–17.0)
Immature Granulocytes: 1 %
Lymphocytes Relative: 3 %
Lymphs Abs: 0.4 10*3/uL — ABNORMAL LOW (ref 0.7–4.0)
MCH: 30.1 pg (ref 26.0–34.0)
MCHC: 34.8 g/dL (ref 30.0–36.0)
MCV: 86.5 fL (ref 80.0–100.0)
Monocytes Absolute: 0.4 10*3/uL (ref 0.1–1.0)
Monocytes Relative: 4 %
Neutro Abs: 10.7 10*3/uL — ABNORMAL HIGH (ref 1.7–7.7)
Neutrophils Relative %: 92 %
Platelets: 201 10*3/uL (ref 150–400)
RBC: 2.59 MIL/uL — ABNORMAL LOW (ref 4.22–5.81)
RDW: 18 % — ABNORMAL HIGH (ref 11.5–15.5)
Smear Review: NORMAL
WBC: 11.6 10*3/uL — ABNORMAL HIGH (ref 4.0–10.5)
nRBC: 0 % (ref 0.0–0.2)

## 2020-08-29 LAB — GLUCOSE, CAPILLARY
Glucose-Capillary: 102 mg/dL — ABNORMAL HIGH (ref 70–99)
Glucose-Capillary: 113 mg/dL — ABNORMAL HIGH (ref 70–99)
Glucose-Capillary: 113 mg/dL — ABNORMAL HIGH (ref 70–99)
Glucose-Capillary: 130 mg/dL — ABNORMAL HIGH (ref 70–99)
Glucose-Capillary: 131 mg/dL — ABNORMAL HIGH (ref 70–99)
Glucose-Capillary: 148 mg/dL — ABNORMAL HIGH (ref 70–99)
Glucose-Capillary: 94 mg/dL (ref 70–99)

## 2020-08-29 LAB — BLOOD GAS, ARTERIAL
Acid-base deficit: 7.4 mmol/L — ABNORMAL HIGH (ref 0.0–2.0)
Allens test (pass/fail): POSITIVE — AB
Bicarbonate: 18.8 mmol/L — ABNORMAL LOW (ref 20.0–28.0)
FIO2: 0.35
MECHVT: 520 mL
O2 Saturation: 92.3 %
PEEP: 5 cmH2O
Patient temperature: 37
RATE: 22 resp/min
pCO2 arterial: 40 mmHg (ref 32.0–48.0)
pH, Arterial: 7.28 — ABNORMAL LOW (ref 7.350–7.450)
pO2, Arterial: 73 mmHg — ABNORMAL LOW (ref 83.0–108.0)

## 2020-08-29 LAB — MAGNESIUM: Magnesium: 2.3 mg/dL (ref 1.7–2.4)

## 2020-08-29 LAB — HEMOGLOBIN AND HEMATOCRIT, BLOOD
HCT: 21.5 % — ABNORMAL LOW (ref 39.0–52.0)
HCT: 19.4 % — ABNORMAL LOW (ref 39.0–52.0)
Hemoglobin: 7.5 g/dL — ABNORMAL LOW (ref 13.0–17.0)
Hemoglobin: 6.5 g/dL — ABNORMAL LOW (ref 13.0–17.0)

## 2020-08-29 MED ORDER — HYDROMORPHONE HCL 1 MG/ML IJ SOLN
INTRAMUSCULAR | Status: AC
  Filled 2020-08-29: qty 2

## 2020-08-29 MED ORDER — FUROSEMIDE 10 MG/ML IJ SOLN: 60.0000 mg | Freq: Every day | INTRAMUSCULAR | Status: DC

## 2020-08-29 MED ORDER — FUROSEMIDE 10 MG/ML IJ SOLN
80.0000 mg | Freq: Every day | INTRAMUSCULAR | Status: DC
  Administered 2020-08-29 – 2020-08-30 (×2): 80 mg via INTRAVENOUS
  Filled 2020-08-29 (×2): qty 8

## 2020-08-29 MED ORDER — MIDAZOLAM HCL 2 MG/2ML IJ SOLN
INTRAMUSCULAR | Status: AC
  Filled 2020-08-29: qty 2

## 2020-08-29 MED ORDER — MIDAZOLAM HCL 2 MG/2ML IJ SOLN
2.0000 mg | INTRAMUSCULAR | Status: DC | PRN
  Administered 2020-08-29: 2 mg via INTRAVENOUS

## 2020-08-29 MED ORDER — HYDROMORPHONE HCL 1 MG/ML IJ SOLN
1.0000 mg | INTRAMUSCULAR | Status: DC | PRN
  Administered 2020-08-29: 2 mg via INTRAVENOUS

## 2020-08-29 NOTE — Progress Notes (Signed)
[facility]  ROUNDING NOTE   Subjective:   [name] admitted to [facility] on 6/46/8032 for Metabolic acidosis [Z22.4] Encounter for central line placement [Z45.2] Acute renal failure (ARF) ([place]) [N17.9] Acute respiratory failure with hypoxia ([place]) [J96.01] Sepsis, due to unspecified organism, unspecified whether acute organ dysfunction present ([place]) [A41.9] COVID-19 [U07.1]   Hemodialysis treatment yesterday. Tolerated treatment well. UF of 2 liters. UOP 1276m  Wife at bedside.   Objective:  Vital signs in last 24 hours:  Temp:  [96.98 F (36.1 C)-98.8 F (37.1 C)] 98.2 F (36.8 C) (01/26 1300) Pulse Rate:  [81-90] 86 (01/26 1300) Resp:  [29-37] 34 (01/26 1300) BP: (101-131)/(47-59) 110/52 (01/26 1300) SpO2:  [90 %-99 %] 94 % (01/26 1300) FiO2 (%):  [35 %] 35 % (01/26 0825)  Weight change:  Filed Weights   08/26/20 0232 08/27/20 0500 08/28/20 0420  Weight: 111.5 kg 112 kg 112.6 kg    Intake/Output: I/O last 3 completed shifts: In: 2609.9 [Blood:2202.9; NG/GT:407] Out: 68250[Urine:1400; Other:2000; SIBBCW:8889]  Intake/Output this shift:  No intake/output data recorded.  Physical Exam: General: Critically ill   Head: ETT  Eyes: Eyes closed  Neck: trachea midline  Lungs:  PRVC FiO2 35%, diminished bilaterally  Heart: Regular rate and rhythm  Abdomen:  Soft, +bowel sounds  Extremities:  + peripheral edema.  Neurologic: Intubated and sedate  Skin: No lesions  Access: Right femoral temp HD catheter 11/69   Basic Metabolic Panel: Recent Labs  Lab 08/25/20 0304 08/26/20 0433 08/27/20 0025 08/27/20 1157 08/28/20 0526 08/29/20 0444  NA 132* 131* 133*  --  133* 135  K 5.2* 5.0 4.0 5.1 4.0 3.6  CL 92* 93* 93*  --  94* 96*  CO2 20* 17* 18*  --  18* 19*  GLUCOSE 226* 223* 94  --  98 113*  BUN 166* 206* 177*  --  191* 157*  CREATININE 6.03* 6.94* 5.90*  --  6.80* 5.73*  CALCIUM 6.6* 6.2* 6.1*  --  5.8* 6.3*  MG 2.3 2.7* 2.3  --  2.3 2.3   PHOS 9.4*  10.0* 11.0*  11.9* 10.5*  --  16.9* 11.0*    Liver Function Tests: Recent Labs  Lab 08/25/20 0304 08/26/20 0433 08/27/20 0025 08/28/20 0526 08/29/20 0444  ALBUMIN 1.3* 1.2* 1.3* 1.3* 1.3*   No results for input(s): LIPASE, AMYLASE in the last 168 hours. No results for input(s): AMMONIA in the last 168 hours.  CBC: Recent Labs  Lab 08/25/20 1217 08/26/20 0433 08/27/20 0025 08/27/20 1823 08/28/20 0526 08/28/20 1234 08/28/20 1932 08/28/20 2349 08/29/20 0444 08/29/20 1138  WBC 24.3* 19.2* 18.4*  --  19.9*  --   --   --  11.6*  --   NEUTROABS 23.0*  --   --   --  18.4*  --   --   --  10.7*  --   HGB 7.2* 6.9* 6.5*   < > 7.8* 9.6* 8.0* 8.1* 7.8* 7.5*  HCT 19.4* 18.8* 18.2*   < > 23.0* 27.4* 23.6* 23.5* 22.4* 21.5*  MCV 81.5 83.2 82.7  --  87.1  --   --   --  86.5  --   PLT 160 164 189  --  199  --   --   --  201  --    < > = values in this interval not displayed.    Cardiac Enzymes: No results for input(s): CKTOTAL, CKMB, CKMBINDEX, TROPONINI in the last 168  hours.  BNP: Invalid input(s): POCBNP  CBG: Recent Labs  Lab 08/28/20 2137 08/28/20 2335 08/29/20 0343 08/29/20 0748 08/29/20 1108  GLUCAP 86 94 102* 113* 131*    Microbiology: Results for orders placed or performed during the hospital encounter of 09/03/2020  Blood Culture (routine x 2)     Status: None   Collection Time: 08/07/2020  9:50 AM   Specimen: BLOOD  Result Value Ref Range Status   Specimen Description BLOOD LEFT [facility]  Final   Special Requests   Final    BOTTLES DRAWN AEROBIC AND ANAEROBIC Blood Culture adequate volume   Culture   Final    NO GROWTH 5 DAYS Performed at [facility], [address] [place], [place] [address]    Report Status 08/18/2020 FINAL  Final  Blood Culture (routine x 2)     Status: None   Collection Time: 08/10/2020  9:50 AM   Specimen: BLOOD  Result Value Ref Range Status   Specimen Description BLOOD RIGHT AC  Final   Special Requests   Final     BOTTLES DRAWN AEROBIC AND ANAEROBIC Blood Culture results may not be optimal due to an excessive volume of blood received in culture bottles   Culture   Final    NO GROWTH 5 DAYS Performed at [facility], [address], [place], [place] [address]    Report Status 08/18/2020 FINAL  Final  Urine culture     Status: None   Collection Time: 08/19/2020 11:36 AM   Specimen: Urine, Random  Result Value Ref Range Status   Specimen Description   Final    URINE, RANDOM Performed at [facility], [address], [place], [place] [address]    Special Requests   Final    NONE Performed at [facility], [address], [place], [place] [address]    Culture   Final    NO GROWTH Performed at [facility], [place] [address], [place], [place] [address]    Report Status 08/14/2020 FINAL  Final  Culture, respiratory     Status: None   Collection Time: 08/17/20  3:15 PM   Specimen: Tracheal Aspirate; Respiratory  Result Value Ref Range Status   Specimen Description   Final    TRACHEAL ASPIRATE Performed at [facility], [address], [place], [place] [address]    Special Requests   Final    NONE Performed at [facility], [place], [place] [address]    Gram Stain NO WBC SEEN MODERATE GRAM VARIABLE ROD   Final   Culture   Final    ABUNDANT SERRATIA MARCESCENS SUSCEPTIBILITIES PERFORMED ON PREVIOUS CULTURE WITHIN THE LAST 5 DAYS. Performed at [facility], [place] [address] [place], [place] [address]    Report Status 08/20/2020 FINAL  Final  Culture, respiratory     Status: None   Collection Time: 08/18/20 11:09 AM   Specimen: Tracheal Aspirate; Respiratory  Result Value Ref Range Status   Specimen Description   Final    TRACHEAL ASPIRATE Performed at [facility], [address] [place],  [address]    Special Requests   Final    NONE Performed at [facility], [place],  [address]    Gram Stain   Final    MODERATE WBC PRESENT, PREDOMINANTLY PMN MODERATE GRAM NEGATIVE RODS FEW GRAM VARIABLE ROD RARE GRAM POSITIVE COCCI Performed at [facility],  [address], [place], [place] [address]    Culture ABUNDANT SERRATIA MARCESCENS  Final   Report Status 08/20/2020 FINAL  Final   Organism ID, Bacteria SERRATIA MARCESCENS  Final      Susceptibility   Serratia marcescens - MIC*    CEFAZOLIN >=64 RESISTANT Resistant     CEFEPIME <=0.12 SENSITIVE Sensitive     CEFTAZIDIME <=1 SENSITIVE Sensitive     CEFTRIAXONE <=0.25 SENSITIVE Sensitive     CIPROFLOXACIN <=0.25 SENSITIVE Sensitive     GENTAMICIN <=1 SENSITIVE Sensitive     TRIMETH/SULFA <=20 SENSITIVE Sensitive     * ABUNDANT SERRATIA MARCESCENS  MRSA PCR Screening     Status: None   Collection Time: 08/18/20 12:52 PM   Specimen: Nasopharyngeal  Result Value Ref Range Status   MRSA by PCR NEGATIVE NEGATIVE Final    Comment:        The GeneXpert MRSA Assay (FDA approved for NASAL specimens only), is one component of a comprehensive MRSA colonization surveillance program. It is not intended to diagnose MRSA infection nor to guide or monitor treatment for MRSA infections. Performed at [facility], [address] [place], [place] [address]   Culture, respiratory (non-expectorated)     Status: None   Collection Time: 08/21/20  4:52 PM   Specimen: Tracheal Aspirate; Respiratory  Result Value Ref Range Status   Specimen Description   Final    TRACHEAL ASPIRATE Performed at [facility], [address] [place], [place] [address]    Special Requests   Final    NONE Performed at [facility], [address] [place], [place] [address]    Gram Stain   Final    FEW WBC PRESENT, PREDOMINANTLY PMN ABUNDANT GRAM VARIABLE ROD MODERATE GRAM POSITIVE COCCI Performed at [facility], [place] [address] [place], [place] [address]    Culture  ABUNDANT SERRATIA MARCESCENS  Final   Report Status 08/24/2020 FINAL  Final   Organism ID, Bacteria SERRATIA MARCESCENS  Final      Susceptibility   Serratia marcescens - MIC*    CEFAZOLIN >=64 RESISTANT Resistant     CEFEPIME <=0.12 SENSITIVE Sensitive     CEFTAZIDIME <=1 SENSITIVE Sensitive     CEFTRIAXONE <=0.25 SENSITIVE Sensitive     CIPROFLOXACIN <=0.25 SENSITIVE Sensitive     GENTAMICIN <=1 SENSITIVE Sensitive     TRIMETH/SULFA <=20 SENSITIVE Sensitive     * ABUNDANT SERRATIA MARCESCENS  CULTURE, BLOOD (ROUTINE X 2) w Reflex to ID Panel     Status: None   Collection Time: 08/22/20  1:37 PM   Specimen: BLOOD  Result Value Ref Range Status   Specimen Description BLOOD BLOOD LEFT HAND  Final   Special Requests   Final    BOTTLES DRAWN AEROBIC AND ANAEROBIC Blood Culture adequate volume   Culture   Final    NO GROWTH 5 DAYS Performed at [facility], [address] [place], [place] [address]    Report Status 08/27/2020 FINAL  Final  CULTURE, BLOOD (ROUTINE X 2) w Reflex to ID Panel     Status: None   Collection Time: 08/22/20  1:37 PM   Specimen: BLOOD  Result Value Ref Range Status   Specimen Description BLOOD LEFT ANTECUBITAL  Final   Special Requests   Final    BOTTLES DRAWN AEROBIC AND ANAEROBIC Blood Culture adequate volume   Culture   Final    NO GROWTH 5 DAYS Performed at [facility],  [place], [place] [address]    Report Status 08/27/2020 FINAL  Final    Coagulation Studies: Recent Labs    08/28/20 0526  LABPROT 15.7*  INR 1.3*    Urinalysis: No results for input(s): COLORURINE, LABSPEC, PHURINE, GLUCOSEU, HGBUR, BILIRUBINUR, KETONESUR, PROTEINUR, UROBILINOGEN, NITRITE, LEUKOCYTESUR in the last 72 hours.  Invalid input(s): APPERANCEUR    Imaging: No results found.   Medications:   . sodium chloride    . famotidine (PEPCID) IV 20 mg (08/29/20 1148)  . feeding supplement (NEPRO CARB STEADY) 1,000 mL (08/28/20  1656)  . fentaNYL infusion INTRAVENOUS Stopped (08/25/20 0846)   . amiodarone  200 mg Per Tube BID  . chlorhexidine gluconate (MEDLINE KIT)  15 mL Mouth Rinse BID  . Chlorhexidine Gluconate Cloth  6 each Topical Daily  . docusate  100 mg Per Tube BID  . feeding supplement (PROSource TF)  90 mL Per Tube BID  . fiber  1 packet Per Tube BID  . furosemide  60 mg Intravenous Daily  . insulin aspart  0-20 Units Subcutaneous Q4H  . insulin detemir  20 Units Subcutaneous BID  . mouth rinse  15 mL Mouth Rinse 10 times per day  . metoprolol tartrate  12.5 mg Per NG tube BID  . multivitamin  1 tablet Per Tube QHS  . polyethylene glycol  17 g Per Tube Daily  . sodium chloride flush  10-40 mL Intracatheter Q12H  . sodium chloride flush  3 mL Intravenous Q12H   sodium chloride, acetaminophen (TYLENOL) oral liquid 160 mg/5 mL, docusate, fentaNYL, heparin, metoprolol tartrate, midazolam, sodium chloride flush, sodium chloride flush  Assessment/ Plan:  [name] is a 70 y.o. white male with coronary artery disease, diabetes, glaucoma, history of kidney stones, hyperlipidemia, hypertension, left knee replacement, lithotripsy, basal cell carcinoma, was admitted on 1/94/1740 with Metabolic acidosis [C14.4] Encounter for central line placement [Z45.2] Acute renal failure (ARF) ([place]) [N17.9] Acute respiratory failure with hypoxia ([place]) [J96.01] Sepsis, due to unspecified organism, unspecified whether acute organ dysfunction present ([place]) [A41.9] COVID-19 [U07.1]   #Acute kidney injury Baseline creatinine 1.0 from August 02, 2020 Presenting creatinine of 2.15 Recent Labs       Lab Results  Component Value Date   CREATININE 5.23 (H) 08/24/2020   CREATININE 6.33 (H) 08/23/2020   CREATININE 6.15 (H) 08/23/2020     High dose IV furosemide Hold dialysis and monitor volume status, urine output and renal function.   #Acute respiratory failure Requiring ventilator support.  Intubated  08/14/2020  #COVID-19 pneumonia Treated with ivermectin as outpatient  #Diabetes type 2, insulin dependent: DKA on admission Insulin dependent.   #Secondary Hyperparathyroidism with hypocalcemia and hyperphosphatemia:  - no binders currently.    Overall prognosis is poor.     LOS: [facility] [place] 1/26/20221:52 PM

## 2020-08-29 NOTE — Progress Notes (Signed)
GOALS OF CARE DISCUSSION  The Clinical status was relayed to family in detail. Daughter and Wife came to bedside  Updated and notified of patients medical condition.  Patient remains unresponsive and will not open eyes to command.    Patient is having a weak cough and struggling to remove secretions.   Patient with increased WOB and using accessory muscles to breathe Explained to family course of therapy and the modalities     Patient with Progressive multiorgan failure with a very high probablity of a very minimal chance of meaningful recovery despite all aggressive and optimal medical therapy. Patient is in the Dying  Process associated with Suffering.  Family understands the situation.  They have consented and agreed to DNR status and would like to proceed with Comfort care measures at some point in time.  I will implement END OF LIFE VISITOR POLICY  Family are satisfied with Plan of action and management. All questions answered  Additional CC time 32 mins   Margert Edsall Patricia [place]esa, M.D.  [facility]  Medical Director [place] Director [facility] Cardio-Pulmonary Department

## 2020-08-29 NOTE — Progress Notes (Signed)
Made DNR late this afternoon. Family in and out. No change. Remains unresponsive and in NSR.

## 2020-08-29 NOTE — Progress Notes (Signed)
Neurology Progress Note   S:// Seen and examined. No acute changes overnight.  O:// Current vital signs: BP (!) 110/52   Pulse 86   Temp 98.2 F (36.8 C)   Resp (!) 34   Ht 6' 3.98" (1.93 m)   Wt 112.6 kg   SpO2 94%   BMI 30.23 kg/m  Vital signs in last 24 hours: Temp:  [96.98 F (36.1 C)-98.8 F (37.1 C)] 98.2 F (36.8 C) (01/26 1300) Pulse Rate:  [81-87] 86 (01/26 1300) Resp:  [29-37] 34 (01/26 1300) BP: (101-131)/(47-59) 110/52 (01/26 1300) SpO2:  [90 %-99 %] 94 % (01/26 1300) FiO2 (%):  [35 %] 35 % (01/26 0825) On no sedation Intubated No spontaneous movements Increased work of breathing, breathing above the ventilator. No corneal elicitable on the left.  Weak corneal on the right. Pupils 2 to 3 mm equally reactive To noxious stimulation, no movement noted. To noxious stimulation, no withdrawal. No cough or gag on suctioning.  General: no sedation, remains intubated. HEENT: Normocephalic atraumatic CVs: Regular rhythm Respiratory: Vented Extremities: Trace edema in all fours. Was receiving dialysis at the time of this exam.  Medications  Current Facility-Administered Medications:  .  0.9 %  sodium chloride infusion, 250 mL, Intravenous, PRN, Flora Lipps, MD .  acetaminophen (TYLENOL) 160 MG/5ML solution 650 mg, 650 mg, Per Tube, Q4H PRN, Flora Lipps, MD, 650 mg at 08/24/20 1317 .  amiodarone (PACERONE) tablet 200 mg, 200 mg, Per Tube, BID, Rust-Chester, Britton L, NP, 200 mg at 08/29/20 1143 .  chlorhexidine gluconate (MEDLINE KIT) (PERIDEX) 0.12 % solution 15 mL, 15 mL, Mouth Rinse, BID, Kasa, Kurian, MD, 15 mL at 08/29/20 0755 .  Chlorhexidine Gluconate Cloth 2 % PADS 6 each, 6 each, Topical, Daily, Flora Lipps, MD, 6 each at 08/28/20 1122 .  docusate (COLACE) 50 MG/5ML liquid 100 mg, 100 mg, Per Tube, BID, Flora Lipps, MD, 100 mg at 08/28/20 2123 .  docusate (COLACE) 50 MG/5ML liquid 100 mg, 100 mg, Per Tube, BID PRN, Flora Lipps, MD .  famotidine  (PEPCID) IVPB 20 mg premix, 20 mg, Intravenous, Q24H, Kasa, Kurian, MD, Last Rate: 100 mL/hr at 08/29/20 1148, 20 mg at 08/29/20 1148 .  feeding supplement (NEPRO CARB STEADY) liquid 1,000 mL, 1,000 mL, Per Tube, Continuous, Kasa, Kurian, MD, Last Rate: 55 mL/hr at 08/28/20 1656, 1,000 mL at 08/28/20 1656 .  feeding supplement (PROSource TF) liquid 90 mL, 90 mL, Per Tube, BID, Rosine Door, MD, 90 mL at 08/29/20 1148 .  fentaNYL (SUBLIMAZE) bolus via infusion 25 mcg, 25 mcg, Intravenous, Q15 min PRN, Flora Lipps, MD, 25 mcg at 08/20/20 0247 .  fentaNYL 2542mg in NS 2567m(1067mml) infusion-PREMIX, 0-250 mcg/hr, Intravenous, Continuous, DewFreddi StarrD, Stopped at 08/25/20 [id number] fiber (NUTRISOURCE FIBER) 1 packet, 1 packet, Per Tube, BID, KasFlora LippsD, 1 packet at 08/28/20 2127 .  furosemide (LASIX) injection 80 mg, 80 mg, Intravenous, Daily, Kolluru, Sarath, MD .  heparin injection 1,000-6,000 Units, 1,000-6,000 Units, CRRT, PRN, KeeBradly BienenstockP, 2,000 Units at 08/16/20 1116 .  insulin aspart (novoLOG) injection 0-20 Units, 0-20 Units, Subcutaneous, Q4H, KeeDarel Hong NP, 3 Units at 08/29/20 1149 .  insulin detemir (LEVEMIR) injection 20 Units, 20 Units, Subcutaneous, BID, DewFreddi StarrD, 20 Units at 08/29/20 1112 .  MEDLINE mouth rinse, 15 mL, Mouth Rinse, 10 times per day, KasFlora LippsD, 15 mL at 08/29/20 1156 .  metoprolol tartrate (LOPRESSOR) injection 5 mg, 5 mg, Intravenous,  Q6H PRN, Rust-Chester, Britton L, NP, 5 mg at 08/18/20 1528 .  metoprolol tartrate (LOPRESSOR) tablet 12.5 mg, 12.5 mg, Per NG tube, BID, Aleskerov, Fuad, MD, 12.5 mg at 08/28/20 2123 .  midazolam (VERSED) injection 1-2 mg, 1-2 mg, Intravenous, Q2H PRN, Rust-Chester, Britton L, NP .  multivitamin (RENA-VIT) tablet 1 tablet, 1 tablet, Per Tube, Francina Ames, MD, 1 tablet at 08/28/20 2124 .  polyethylene glycol (MIRALAX / GLYCOLAX) packet 17 g, 17 g, Per Tube, Daily, Freda Jackson B, MD, 17 g at 08/24/20 1000 .  sodium chloride flush (NS) 0.9 % injection 10-40 mL, 10-40 mL, Intracatheter, Q12H, Freddi Starr, MD, 10 mL at 08/28/20 2128 .  sodium chloride flush (NS) 0.9 % injection 10-40 mL, 10-40 mL, Intracatheter, PRN, Freda Jackson B, MD .  sodium chloride flush (NS) 0.9 % injection 3 mL, 3 mL, Intravenous, Q12H, Kasa, Kurian, MD, 3 mL at 08/28/20 2129 .  sodium chloride flush (NS) 0.9 % injection 3 mL, 3 mL, Intravenous, PRN, Flora Lipps, MD, 3 mL at 08/27/20 2139 Labs CBC    Component Value Date/Time   WBC 11.6 (H) 08/29/2020 0444   RBC 2.59 (L) 08/29/2020 0444   HGB 7.5 (L) 08/29/2020 1138   HCT 21.5 (L) 08/29/2020 1138   PLT 201 08/29/2020 0444   MCV 86.5 08/29/2020 0444   MCH 30.1 08/29/2020 0444   MCHC 34.8 08/29/2020 0444   RDW 18.0 (H) 08/29/2020 0444   LYMPHSABS 0.4 (L) 08/29/2020 0444   MONOABS 0.4 08/29/2020 0444   EOSABS 0.0 08/29/2020 0444   BASOSABS 0.0 08/29/2020 0444    CMP     Component Value Date/Time   NA 135 08/29/2020 0444   NA 139 09/03/2016 1035   K 3.6 08/29/2020 0444   CL 96 (L) 08/29/2020 0444   CO2 19 (L) 08/29/2020 0444   GLUCOSE 113 (H) 08/29/2020 0444   BUN 157 (H) 08/29/2020 0444   BUN 17 09/03/2016 1035   CREATININE 5.73 (H) 08/29/2020 0444   CREATININE 0.89 01/11/2015 0833   CALCIUM 6.3 (LL) 08/29/2020 0444   PROT 5.3 (L) 08/22/2020 0352   PROT 7.1 09/03/2016 1035   ALBUMIN 1.3 (L) 08/29/2020 0444   ALBUMIN 4.5 09/03/2016 1035   AST 158 (H) 08/22/2020 0352   ALT 166 (H) 08/22/2020 0352   ALKPHOS 316 (H) 08/22/2020 0352   BILITOT 1.1 08/22/2020 0352   BILITOT 0.3 09/03/2016 1035   GFRNONAA 10 (L) 08/29/2020 0444   GFRNONAA >89 01/11/2015 0833   GFRAA 92 09/03/2016 1035   GFRAA >89 01/11/2015 0833   Imaging I have reviewed images in epic and the results pertinent to this consultation are: No new imaging since last examination  Assessment: 70 year old man currently critically ill with  COVID-19 infection and complications of respiratory failure, acute renal failure, initially came in with DKA and a clinical course that was complicated by hypoglycemia following correction of hyperglycemia, acute anemia, remains unresponsive despite sedation being discontinued for over 48 hours now. Brain MRI with no acute ischemic stroke or any gross evidence of anoxic injury but has evidence of cerebral amyloid angiopathy due to chronic microhemorrhages scattered all over. Due to prolonged hypoxia/respiratory failure, cannot rule out hypoxic ischemic encephalopathy. Given the fact that he was on paralytics and sedation for a long period of time in the setting of acute renal failure, giving him some more time prior to prognosticating was very prudent.  Now off of sedation for more than 3 days, I have  still not seen any meaningful cortical function on exam and even his brainstem function seems to be worsening with the loss of the left corneal and loss of cough and gag.  At this time, given the duration since his initial intubation and no meaningful response up until now, chances of neurological meaningful recovery, if any would be very grim.  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 Recommended considerations of comfort measures given the above findings neurologically as well as prolonged multiorgan failure.  She is going to consider and discuss with family.  Discussed my plan with Dr. Mortimer Fries, who has a family meeting scheduled for later this afternoon with wife and daughter.  He is at me know if I can be of any help.  -- Amie Portland, MD Neurologist [facility] Pager: [phone]   CRITICAL CARE ATTESTATION Performed by: Amie Portland,  MD Total critical care time: 39 minutes Critical care time was exclusive of separately billable procedures and treating other patients and/or supervising APPs/Residents/Students Critical care was necessary to treat or prevent imminent or life-threatening deterioration due to multifactorial toxic metabolic encephalopathy, hypoxic ischemic encephalopathy. This patient is critically ill and at significant risk for neurological worsening and/or death and care requires constant monitoring. Critical care was time spent personally by me on the following activities: development of treatment plan with patient and/or surrogate as well as nursing, discussions with consultants, evaluation of patient's response to treatment, examination of patient, obtaining history from patient or surrogate, ordering and performing treatments and interventions, ordering and review of laboratory studies, ordering and review of radiographic studies, pulse oximetry, re-evaluation of patient's condition, participation in multidisciplinary rounds and medical decision making of high complexity in the care of this patient.

## 2020-08-29 NOTE — Progress Notes (Signed)
CRITICAL CARE NOTE  70 y.o.malewith PMH as noted below who presents with altered mental status after he was found unresponsive this morning.   Per EMS he was initially hypoxic although this appears to have resolved. The patient himself is unable to give any history.  PatientDx with COVID 1st week of Jan 2022 +NVD for several days ER shows severe acidosis with elevated sugars Patient with severe hypothermia and intubated for severe resp failure  Past Medical History:  He, has a past medical history of CAD (coronary artery disease), Cataract, CHICKENPOX, HX OF (08/30/2008), Chronic kidney disease, Diabetes mellitus, Glaucoma, History of urinary calculi (08/30/2008), Hyperlipidemia, Hypertension, Renal calculus or stone, and Skin cancer of nose.   Significant Hospital Events:  1/10 admitted to ICU, severe DKA,gastroenteritis, COVID 19 infection causing DKA and gastroenteritis 1/11 severe resp failure 1/12 severe DKA 1/13 severe resp failure, worsening Renal failure 1/14- Reviewed care plan with cardiologist Dr Ubaldo Glassing, plan for cardiac cardoversion. Patient remains in shock with AFRVR on MV, medically optimizing for procedure. I met with daughter York Cerise.  1/15-patient improved slightly, now with rate controlled AF off Neo. Discussed case with cardiology.+ISCHEMIC CARDIOMYOPATHY 08/18/20 - Serratia Marcescens 08/19/20-vitals are improved, rate controlled. BP normalized. Ventilator weaned to FiO2 60% remains crtically ill.  08/23/20 - vent weaned to 40% FiO2 and PEEP 5 08/25/20-patient remains critically ill, CBC with critially low Hb will repeat specimen and transfuse if needed today. Continuing renal replacement with severe electrolyte derragements and oliguric renal failure. Have updated POA Jaime today. 1/18filure to wean from vent  1/24 MRI Innumerable scattered chronic micro hemorrhages involving the bilateral cerebral and cerebellar hemispheres, predominantly peripheral in  location, and most suggestive of underlying cerebral amyloid angiopathy. 1/25 severe resp failure, severe encephalopathy     CC  follow up respiratory failure  SUBJECTIVE Patient remains critically ill Prognosis is guarded  +severe Resp distress    BP (!) 120/52   Pulse 86   Temp 98.06 F (36.7 C)   Resp (!) 35   Ht 6' 3.98" (1.93 m)   Wt 112.6 kg   SpO2 93%   BMI 30.23 kg/m    I/O last 3 completed shifts: In: 2609.9 [Blood:2202.9; NG/GT:407] Out: 67482[Urine:1400; Other:2000; Stool:2800] No intake/output data recorded.  SpO2: 93 % FiO2 (%): 35 %  Estimated body mass index is 30.23 kg/m as calculated from the following:   Height as of this encounter: 6' 3.98" (1.93 m).   Weight as of this encounter: 112.6 kg.  SIGNIFICANT EVENTS   REVIEW OF SYSTEMS  PATIENT IS UNABLE TO PROVIDE COMPLETE REVIEW OF SYSTEMS DUE TO SEVERE CRITICAL ILLNESS   Pressure Injury 08/15/20 Sacrum Medial;Lower Deep Tissue Pressure Injury - Purple or maroon localized area of discolored intact skin or blood-filled blister due to damage of underlying soft tissue from pressure and/or shear. bruised looking non-blanchabl (Active)  08/15/20 1200  Location: Sacrum  Location Orientation: Medial;Lower  Staging: Deep Tissue Pressure Injury - Purple or maroon localized area of discolored intact skin or blood-filled blister due to damage of underlying soft tissue from pressure and/or shear.  Wound Description (Comments): bruised looking non-blanchable purple on soft tissue, Serous blisters bilaterally  Present on Admission:      PHYSICAL EXAMINATION: GENERAL:critically ill appearing, +resp distress NECK: Supple.  PULMONARY: +rhonchi, +wheezing CARDIOVASCULAR: S1 and S2.  GASTROINTESTINAL: Soft, nontender, +Positive bowel sounds.  MUSCULOSKELETAL: No swelling, clubbing, or edema.  NEUROLOGIC: obtunded, GCS<8 SKIN:intact,warm,dry    Patient assessed or the symptoms described in the  history of present illness.  In the context of the Global COVID-19 pandemic, which necessitated consideration that the patient might be at risk for infection with the SARS-CoV-2 virus that causes COVID-19, Institutional protocols and algorithms that pertain to the evaluation of patients at risk for COVID-19 are in a state of rapid change based on information released by regulatory bodies including the CDC and federal and state organizations. These policies and algorithms were followed during the patient's care while in hospital.    MEDICATIONS: I have reviewed all medications and confirmed regimen as documented   CULTURE RESULTS   Recent Results (from the past 240 hour(s))  Culture, respiratory (non-expectorated)     Status: None   Collection Time: 08/21/20  4:52 PM   Specimen: Tracheal Aspirate; Respiratory  Result Value Ref Range Status   Specimen Description   Final    TRACHEAL ASPIRATE Performed at [facility], [address] [place], [place] [address]    Special Requests   Final    NONE Performed at [facility], [address] [place], [place] [address]    Gram Stain   Final    FEW WBC PRESENT, PREDOMINANTLY PMN ABUNDANT GRAM VARIABLE ROD MODERATE GRAM POSITIVE COCCI Performed at [facility], [place] [address], [place], [place] [address]    Culture ABUNDANT SERRATIA MARCESCENS  Final   Report Status 08/24/2020 FINAL  Final   Organism ID, Bacteria SERRATIA MARCESCENS  Final      Susceptibility   Serratia marcescens - MIC*    CEFAZOLIN >=64 RESISTANT Resistant     CEFEPIME <=0.12 SENSITIVE Sensitive     CEFTAZIDIME <=1 SENSITIVE Sensitive     CEFTRIAXONE <=0.25 SENSITIVE Sensitive     CIPROFLOXACIN <=0.25 SENSITIVE Sensitive     GENTAMICIN <=1 SENSITIVE Sensitive     TRIMETH/SULFA <=20 SENSITIVE Sensitive     * ABUNDANT SERRATIA MARCESCENS  CULTURE, BLOOD (ROUTINE X 2) w Reflex to ID Panel     Status: None   Collection Time: 08/22/20  1:37 PM    Specimen: BLOOD  Result Value Ref Range Status   Specimen Description BLOOD BLOOD LEFT HAND  Final   Special Requests   Final    BOTTLES DRAWN AEROBIC AND ANAEROBIC Blood Culture adequate volume   Culture   Final    NO GROWTH 5 DAYS Performed at [facility], [address] [place], [place] [address]    Report Status 08/27/2020 FINAL  Final  CULTURE, BLOOD (ROUTINE X 2) w Reflex to ID Panel     Status: None   Collection Time: 08/22/20  1:37 PM   Specimen: BLOOD  Result Value Ref Range Status   Specimen Description BLOOD LEFT ANTECUBITAL  Final   Special Requests   Final    BOTTLES DRAWN AEROBIC AND ANAEROBIC Blood Culture adequate volume   Culture   Final    NO GROWTH 5 DAYS Performed at [facility], [address] [place], [place] [address]    Report Status 08/27/2020 FINAL  Final          IMAGING    No results found.   Nutrition Status: Nutrition Problem: Moderate Malnutrition Etiology: chronic illness (CKD) Signs/Symptoms: mild fat depletion,mild muscle depletion Interventions: Tube feeding     Indwelling Urinary Catheter continued, requirement due to   Reason to continue Indwelling Urinary Catheter strict Intake/Output monitoring for hemodynamic instability   Central Line/ continued, requirement due to  Reason to continue [employer] of central  venous pressure or other hemodynamic parameters and poor IV access   Ventilator continued, requirement due to severe respiratory failure   Ventilator Sedation RASS 0 to -2      ASSESSMENT AND PLAN SYNOPSIS   70 yo white male UNVACCINATED with COVID 19 infection causing gastroenteritis with severe hypoxic respiratory failure with severe DKA with progressive renal failure with multiorgan failure with ischemic cardiomyopathy Day 14 in ICU with abnormal MRI brain with micro hemmrohages  Acute hypoxemic respiratory failure due to COVID-19 pneumonia Mechanical ventilation via  ARDS protocol, target PRVC 6 cc/kg Wean PEEP and FiO2 as able VAP prevention order set IV STEROIDS   Severe ACUTE Hypoxic and Hypercapnic Respiratory Failure -continue Full MV support -continue Bronchodilator Therapy -Wean Fio2 and PEEP as tolerated -VAP/VENT bundle implementation  ACUTE SYSTOLIC CARDIAC FAILURE-NSTEMI CARDIAC ISCHEMIA WITH AFIB  -oxygen as needed -Lasix as tolerated -follow up cardiac enzymes as indicated -follow up cardiology recs   obesity, possible OSA.   Will certainly impact respiratory mechanics, ventilator weaning Suspect will need to consider additional PEEP   ACUTE KIDNEY INJURY/Renal Failure -continue Foley Catheter-assess need -Avoid nephrotoxic agents -Follow urine output, BMP -Ensure adequate renal perfusion, optimize oxygenation -Renal dose medications HD as needed    NEUROLOGY Acute toxic metabolic encephalopathy Signs of severe brain damage Follow up Neuro recs   CARDIAC ICU monitoring  GI GI PROPHYLAXIS as indicated   DIET-->TF's as tolerated Constipation protocol as indicated  ENDO - will use ICU hypoglycemic\Hyperglycemia protocol if indicated     ELECTROLYTES -follow labs as needed -replace as needed -pharmacy consultation and following   DVT/GI PRX ordered and assessed TRANSFUSIONS AS NEEDED MONITOR FSBS I Assessed the need for Labs I Assessed the need for Foley I Assessed the need for Central Venous Line Family Discussion when available I Assessed the need for Mobilization I made an Assessment of medications to be adjusted accordingly Safety Risk assessment completed   CASE DISCUSSED IN MULTIDISCIPLINARY ROUNDS WITH ICU TEAM  Critical Care Time devoted to patient care services described in this note is 54 minutes.   Overall, patient is critically ill, prognosis is guarded.  Patient with Multiorgan failure and at high risk for cardiac arrest and death.    Corrin Parker, M.D.  [facility]  Medical Director [place] Director [facility] Cardio-Pulmonary Department

## 2020-08-30 DIAGNOSIS — U071 COVID-19: Secondary | ICD-10-CM | POA: Diagnosis not present

## 2020-08-30 DIAGNOSIS — N170 Acute kidney failure with tubular necrosis: Secondary | ICD-10-CM | POA: Diagnosis not present

## 2020-08-30 DIAGNOSIS — Z978 Presence of other specified devices: Secondary | ICD-10-CM

## 2020-08-30 DIAGNOSIS — J9601 Acute respiratory failure with hypoxia: Secondary | ICD-10-CM | POA: Diagnosis not present

## 2020-08-30 LAB — CBC WITH DIFFERENTIAL/PLATELET
Abs Immature Granulocytes: 0.13 10*3/uL — ABNORMAL HIGH (ref 0.00–0.07)
Basophils Absolute: 0 10*3/uL (ref 0.0–0.1)
Basophils Relative: 0 %
Eosinophils Absolute: 0 10*3/uL (ref 0.0–0.5)
Eosinophils Relative: 0 %
HCT: 19.3 % — ABNORMAL LOW (ref 39.0–52.0)
Hemoglobin: 6.5 g/dL — ABNORMAL LOW (ref 13.0–17.0)
Immature Granulocytes: 1 %
Lymphocytes Relative: 4 %
Lymphs Abs: 0.4 10*3/uL — ABNORMAL LOW (ref 0.7–4.0)
MCH: 29.7 pg (ref 26.0–34.0)
MCHC: 33.7 g/dL (ref 30.0–36.0)
MCV: 88.1 fL (ref 80.0–100.0)
Monocytes Absolute: 0.4 10*3/uL (ref 0.1–1.0)
Monocytes Relative: 4 %
Neutro Abs: 9.5 10*3/uL — ABNORMAL HIGH (ref 1.7–7.7)
Neutrophils Relative %: 91 %
Platelets: 194 10*3/uL (ref 150–400)
RBC: 2.19 MIL/uL — ABNORMAL LOW (ref 4.22–5.81)
RDW: 17.9 % — ABNORMAL HIGH (ref 11.5–15.5)
Smear Review: NORMAL
WBC: 10.5 10*3/uL (ref 4.0–10.5)
nRBC: 0 % (ref 0.0–0.2)

## 2020-08-30 LAB — RENAL FUNCTION PANEL
Albumin: 1.3 g/dL — ABNORMAL LOW (ref 3.5–5.0)
Albumin: 1.2 g/dL — ABNORMAL LOW (ref 3.5–5.0)
Anion gap: 20 — ABNORMAL HIGH (ref 5–15)
Anion gap: 21 — ABNORMAL HIGH (ref 5–15)
BUN: 157 mg/dL — ABNORMAL HIGH (ref 8–23)
BUN: 174 mg/dL — ABNORMAL HIGH (ref 8–23)
CO2: 19 mmol/L — ABNORMAL LOW (ref 22–32)
CO2: 17 mmol/L — ABNORMAL LOW (ref 22–32)
Calcium: 6.3 mg/dL — CL (ref 8.9–10.3)
Calcium: 5.7 mg/dL — CL (ref 8.9–10.3)
Chloride: 96 mmol/L — ABNORMAL LOW (ref 98–111)
Chloride: 94 mmol/L — ABNORMAL LOW (ref 98–111)
Creatinine, Ser: 5.73 mg/dL — ABNORMAL HIGH (ref 0.61–1.24)
Creatinine, Ser: 6.27 mg/dL — ABNORMAL HIGH (ref 0.61–1.24)
GFR, Estimated: 10 mL/min — ABNORMAL LOW (ref >60)
GFR, Estimated: 9 mL/min — ABNORMAL LOW (ref >60)
Glucose, Bld: 113 mg/dL — ABNORMAL HIGH (ref 70–99)
Glucose, Bld: 120 mg/dL — ABNORMAL HIGH (ref 70–99)
Phosphorus: 11 mg/dL — ABNORMAL HIGH (ref 2.5–4.6)
Phosphorus: 12 mg/dL — ABNORMAL HIGH (ref 2.5–4.6)
Potassium: 3.6 mmol/L (ref 3.5–5.1)
Potassium: 3.4 mmol/L — ABNORMAL LOW (ref 3.5–5.1)
Sodium: 135 mmol/L (ref 135–145)
Sodium: 132 mmol/L — ABNORMAL LOW (ref 135–145)

## 2020-08-30 LAB — GLUCOSE, CAPILLARY
Glucose-Capillary: 115 mg/dL — ABNORMAL HIGH (ref 70–99)
Glucose-Capillary: 117 mg/dL — ABNORMAL HIGH (ref 70–99)

## 2020-08-30 LAB — MAGNESIUM: Magnesium: 2.3 mg/dL (ref 1.7–2.4)

## 2020-08-30 MED ORDER — GLYCOPYRROLATE 0.2 MG/ML IJ SOLN
0.1000 mg | Freq: Once | INTRAMUSCULAR | Status: AC
  Administered 2020-08-30: 0.2 mg via INTRAVENOUS

## 2020-08-30 MED ORDER — MORPHINE 100MG IN NS 100ML (1MG/ML) PREMIX INFUSION: 10.0000 mg/h | INTRAVENOUS | Status: DC

## 2020-08-30 MED ORDER — SODIUM CHLORIDE 0.9% IV SOLUTION: Freq: Once | INTRAVENOUS | Status: DC

## 2020-08-30 MED ORDER — LORAZEPAM 2 MG/ML IJ SOLN
2.0000 mg | INTRAMUSCULAR | Status: DC | PRN
  Administered 2020-08-30: 2 mg via INTRAVENOUS
  Filled 2020-08-30: qty 1

## 2020-08-30 MED ORDER — CALCIUM GLUCONATE-NACL 1-0.675 GM/50ML-% IV SOLN
1.0000 g | Freq: Once | INTRAVENOUS | Status: AC
  Administered 2020-08-30: 1000 mg via INTRAVENOUS
  Filled 2020-08-30: qty 50

## 2020-08-30 MED ORDER — GLYCOPYRROLATE 0.2 MG/ML IJ SOLN
0.1000 mg | Freq: Once | INTRAMUSCULAR | Status: AC
  Administered 2020-08-30: 0.1 mg via INTRAVENOUS
  Filled 2020-08-30: qty 1

## 2020-08-30 MED ORDER — MORPHINE 100MG IN NS 100ML (1MG/ML) PREMIX INFUSION
1.0000 mg/h | INTRAVENOUS | Status: DC
  Administered 2020-08-30: 10 mg/h via INTRAVENOUS
  Filled 2020-08-30: qty 100

## 2020-08-31 LAB — TYPE AND SCREEN
ABO/RH(D): O POS
Antibody Screen: NEGATIVE
Unit division: 0
Unit division: 0
Unit division: 0

## 2020-08-31 LAB — BPAM RBC
Blood Product Expiration Date: 202202012359
Blood Product Expiration Date: 202202252359
Blood Product Expiration Date: 202202252359
ISSUE DATE / TIME: 202201241002
ISSUE DATE / TIME: 202201242106
Unit Type and Rh: 5100
Unit Type and Rh: 5100
Unit Type and Rh: 9500

## 2020-08-31 LAB — PREPARE RBC (CROSSMATCH)

## 2020-09-04 NOTE — Progress Notes (Signed)
CRITICAL CARE NOTE  70 y.o.malewith PMH as noted below who presents with altered mental status after he was found unresponsive this morning.   Per EMS he was initially hypoxic although this appears to have resolved. The patient himself is unable to give any history.  PatientDx with COVID 1st week of Jan 2022 +NVD for several days ER shows severe acidosis with elevated sugars Patient with severe hypothermia and intubated for severe resp failure  Past Medical History:  He, has a past medical history of CAD (coronary artery disease), Cataract, CHICKENPOX, HX OF (08/30/2008), Chronic kidney disease, Diabetes mellitus, Glaucoma, History of urinary calculi (08/30/2008), Hyperlipidemia, Hypertension, Renal calculus or stone, and Skin cancer of nose.   Significant Hospital Events:  1/10 admitted to ICU, severe DKA,gastroenteritis, COVID 19 infection causing DKA and gastroenteritis 1/11 severe resp failure 1/12 severe DKA 1/13 severe resp failure, worsening Renal failure 1/14- Reviewed care plan with cardiologist Dr Ubaldo Glassing, plan for cardiac cardoversion. Patient remains in shock with AFRVR on MV, medically optimizing for procedure. I met with daughter York Cerise.  1/15-patient improved slightly, now with rate controlled AF off Neo. Discussed case with cardiology.+ISCHEMIC CARDIOMYOPATHY 08/18/20 - Serratia Marcescens 08/19/20-vitals are improved, rate controlled. BP normalized. Ventilator weaned to FiO2 60% remains crtically ill.  08/23/20 - vent weaned to 40% FiO2 and PEEP 5 08/25/20-patient remains critically ill, CBC with critially low Hb will repeat specimen and transfuse if needed today. Continuing renal replacement with severe electrolyte derragements and oliguric renal failure. Have updated POA Jaime today. 1/56filure to wean from vent  1/24 MRI Innumerable scattered chronic micro hemorrhages involving the bilateral cerebral and cerebellar hemispheres, predominantly peripheral in  location, and most suggestive of underlying cerebral amyloid angiopathy. 1/25 severe resp failure, severe encephalopathy     CC  follow up respiratory failure  SUBJECTIVE Patient remains critically ill Prognosis is guarded  +severe Resp distress    BP (!) 92/56   Pulse 84   Temp 98.1 F (36.7 C)   Resp (!) 30   Ht 6' 3.98" (1.93 m)   Wt 108.6 kg   SpO2 94%   BMI 29.16 kg/m    I/O last 3 completed shifts: In: 10 [I.V.:10] Out: 1665 [Urine:1065; Stool:600] Total I/O In: 80 [I.V.:20; Other:60] Out: 10 [Urine:10]  SpO2: 94 % FiO2 (%): 35 %  Estimated body mass index is 29.16 kg/m as calculated from the following:   Height as of this encounter: 6' 3.98" (1.93 m).   Weight as of this encounter: 108.6 kg.  SIGNIFICANT EVENTS   REVIEW OF SYSTEMS  PATIENT IS UNABLE TO PROVIDE COMPLETE REVIEW OF SYSTEMS DUE TO SEVERE CRITICAL ILLNESS   Pressure Injury 08/15/20 Sacrum Medial;Lower Deep Tissue Pressure Injury - Purple or maroon localized area of discolored intact skin or blood-filled blister due to damage of underlying soft tissue from pressure and/or shear. bruised looking non-blanchabl (Active)  08/15/20 1200  Location: Sacrum  Location Orientation: Medial;Lower  Staging: Deep Tissue Pressure Injury - Purple or maroon localized area of discolored intact skin or blood-filled blister due to damage of underlying soft tissue from pressure and/or shear.  Wound Description (Comments): bruised looking non-blanchable purple on soft tissue, Serous blisters bilaterally  Present on Admission:      PHYSICAL EXAMINATION: GENERAL:critically ill appearing, +resp distress NECK: Supple.  PULMONARY: +rhonchi, +wheezing CARDIOVASCULAR: S1 and S2.  GASTROINTESTINAL: Soft, nontender, +Positive bowel sounds.  MUSCULOSKELETAL: No swelling, clubbing, or edema.  NEUROLOGIC: obtunded, GCS<8 SKIN:intact,warm,dry    Patient assessed or the symptoms described  in the history of  present illness.  In the context of the Global COVID-19 pandemic, which necessitated consideration that the patient might be at risk for infection with the SARS-CoV-2 virus that causes COVID-19, Institutional protocols and algorithms that pertain to the evaluation of patients at risk for COVID-19 are in a state of rapid change based on information released by regulatory bodies including the CDC and federal and state organizations. These policies and algorithms were followed during the patient's care while in hospital.    MEDICATIONS: I have reviewed all medications and confirmed regimen as documented   CULTURE RESULTS   Recent Results (from the past 240 hour(s))  Culture, respiratory (non-expectorated)     Status: None   Collection Time: 08/21/20  4:52 PM   Specimen: Tracheal Aspirate; Respiratory  Result Value Ref Range Status   Specimen Description   Final    TRACHEAL ASPIRATE Performed at [facility], [address] [place], [place] [address]    Special Requests   Final    NONE Performed at [facility], [address] [place], [place] [address]    Gram Stain   Final    FEW WBC PRESENT, PREDOMINANTLY PMN ABUNDANT GRAM VARIABLE ROD MODERATE GRAM POSITIVE COCCI Performed at [facility], [place] [address] [place], [place] [address]    Culture ABUNDANT SERRATIA MARCESCENS  Final   Report Status 08/24/2020 FINAL  Final   Organism ID, Bacteria SERRATIA MARCESCENS  Final      Susceptibility   Serratia marcescens - MIC*    CEFAZOLIN >=64 RESISTANT Resistant     CEFEPIME <=0.12 SENSITIVE Sensitive     CEFTAZIDIME <=1 SENSITIVE Sensitive     CEFTRIAXONE <=0.25 SENSITIVE Sensitive     CIPROFLOXACIN <=0.25 SENSITIVE Sensitive     GENTAMICIN <=1 SENSITIVE Sensitive     TRIMETH/SULFA <=20 SENSITIVE Sensitive     * ABUNDANT SERRATIA MARCESCENS  CULTURE, BLOOD (ROUTINE X 2) w Reflex to ID Panel     Status: None   Collection Time: 08/22/20  1:37 PM    Specimen: BLOOD  Result Value Ref Range Status   Specimen Description BLOOD BLOOD LEFT HAND  Final   Special Requests   Final    BOTTLES DRAWN AEROBIC AND ANAEROBIC Blood Culture adequate volume   Culture   Final    NO GROWTH 5 DAYS Performed at [facility], [address] [place], [place] [address]    Report Status 08/27/2020 FINAL  Final  CULTURE, BLOOD (ROUTINE X 2) w Reflex to ID Panel     Status: None   Collection Time: 08/22/20  1:37 PM   Specimen: BLOOD  Result Value Ref Range Status   Specimen Description BLOOD LEFT ANTECUBITAL  Final   Special Requests   Final    BOTTLES DRAWN AEROBIC AND ANAEROBIC Blood Culture adequate volume   Culture   Final    NO GROWTH 5 DAYS Performed at [facility], [address], [place], [place] [address]    Report Status 08/27/2020 FINAL  Final          IMAGING    No results found.   Nutrition Status: Nutrition Problem: Moderate Malnutrition Etiology: chronic illness (CKD) Signs/Symptoms: mild fat depletion,mild muscle depletion Interventions: Tube feeding     Indwelling Urinary Catheter continued, requirement due to   Reason to continue Indwelling Urinary Catheter strict Intake/Output monitoring for hemodynamic instability   Central Line/ continued, requirement due to  Reason to continue [employer]  Monitoring of central venous pressure or other hemodynamic parameters and poor IV access   Ventilator continued, requirement due to severe respiratory failure   Ventilator Sedation RASS 0 to -2      ASSESSMENT AND PLAN SYNOPSIS   70 yo white male UNVACCINATED with COVID 19 infection causing gastroenteritis with severe hypoxic respiratory failure with severe DKA with progressive renal failure with multiorgan failure with ischemic cardiomyopathy Day 14 in ICU with abnormal MRI brain with micro hemmrohages  RESPIRATORY: Acute hypoxemic respiratory failure due to COVID-19 pneumonia Mechanical  ventilation via ARDS protocol, target PRVC 6 cc/kg Wean PEEP and FiO2 as able VAP prevention order set IV STEROIDS   Severe ACUTE Hypoxic and Hypercapnic Respiratory Failure -continue Full MV support; however family is planning comfort measures today  -continue Bronchodilator Therapy -Wean Fio2 and PEEP as tolerated -VAP/VENT bundle implementation  ACUTE SYSTOLIC CARDIAC FAILURE-NSTEMI CARDIAC ISCHEMIA WITH AFIB  -oxygen as needed -Lasix as tolerated -follow up cardiac enzymes as indicated -follow up cardiology recs   obesity, possible OSA.   Will certainly impact respiratory mechanics, ventilator weaning Suspect will need to consider additional PEEP   ACUTE KIDNEY INJURY/Renal Failure -continue Foley Catheter-assess need -Avoid nephrotoxic agents -Follow urine output, BMP -Ensure adequate renal perfusion, optimize oxygenation -Renal dose medications HD as needed    NEUROLOGY Acute toxic metabolic encephalopathy Signs of severe brain damage Follow up Neuro recs   CARDIAC ICU monitoring  GI GI PROPHYLAXIS as indicated   DIET-->TF's as tolerated Constipation protocol as indicated  ENDO - will use ICU hypoglycemic\Hyperglycemia protocol if indicated     ELECTROLYTES -follow labs as needed -replace as needed -pharmacy consultation and following   DVT/GI PRX ordered and assessed TRANSFUSIONS AS NEEDED MONITOR FSBS I Assessed the need for Labs I Assessed the need for Foley I Assessed the need for Central Venous Line Family Discussion when available I Assessed the need for Mobilization I made an Assessment of medications to be adjusted accordingly Safety Risk assessment completed   CASE DISCUSSED IN MULTIDISCIPLINARY ROUNDS WITH ICU TEAM  Critical Care Time devoted to patient care services described in this note is 35 minutes.   Overall, patient is critically ill, prognosis is guarded.  Patient with Multiorgan failure and at high risk for cardiac  arrest and death.

## 2020-09-04 NOTE — Progress Notes (Signed)
Patient extubated at 1430. Patient was asystole [phone] . Pronounced at 1625 by [place] and M/ Tarah Buboltz RN

## 2020-09-04 NOTE — Progress Notes (Signed)
Nursing staff called to be present for large family presence. I was able to engage family in waiting area, provide refreshments and get a refreshment cart for family. Family was well but grieving but okay when I left them.

## 2020-09-04 NOTE — Progress Notes (Signed)
[facility]  ROUNDING NOTE   Subjective:   Family has made patient DNR.  Hemoglobin 6.5 - PRBC transfusion ordered.   Objective:  Vital signs in last 24 hours:  Temp:  [95.9 F (35.5 C)-98.4 F (36.9 C)] 97.34 F (36.3 C) (01/27 0600) Pulse Rate:  [77-87] 85 (01/27 0600) Resp:  [23-37] 34 (01/27 0600) BP: (90-117)/(42-59) 112/48 (01/27 0600) SpO2:  [90 %-97 %] 95 % (01/27 0600) FiO2 (%):  [35 %] 35 % (01/27 0743) Weight:  [108.6 kg] 108.6 kg (01/27 0406)  Weight change:  Filed Weights   08/27/20 0500 08/28/20 0420 September 12, 2020 0406  Weight: 112 kg 112.6 kg 108.6 kg    Intake/Output: I/O last 3 completed shifts: In: 10 [I.V.:10] Out: 1665 [Urine:1065; Stool:600]   Intake/Output this shift:  No intake/output data recorded.  Physical Exam: General: Critically ill   Head: ETT  Eyes: Eyes closed  Neck: trachea midline  Lungs:  PRVC FiO2 35%, diminished bilaterally  Heart: Regular rate and rhythm  Abdomen:  Soft, +bowel sounds  Extremities:  + peripheral edema.  Neurologic: Intubated and sedate  Skin: No lesions  Access: Right femoral temp HD catheter 6/75    Basic Metabolic Panel: Recent Labs  Lab 08/26/20 0433 08/27/20 0025 08/27/20 1157 08/28/20 0526 08/29/20 0444 09/12/2020 0534  NA 131* 133*  --  133* 135 132*  K 5.0 4.0 5.1 4.0 3.6 3.4*  CL 93* 93*  --  94* 96* 94*  CO2 17* 18*  --  18* 19* 17*  GLUCOSE 223* 94  --  98 113* 120*  BUN 206* 177*  --  191* 157* 174*  CREATININE 6.94* 5.90*  --  6.80* 5.73* 6.27*  CALCIUM 6.2* 6.1*  --  5.8* 6.3* 5.7*  MG 2.7* 2.3  --  2.3 2.3 2.3  PHOS 11.0*  11.9* 10.5*  --  16.9* 11.0* 12.0*    Liver Function Tests: Recent Labs  Lab 08/26/20 0433 08/27/20 0025 08/28/20 0526 08/29/20 0444 2020-09-12 0534  ALBUMIN 1.2* 1.3* 1.3* 1.3* 1.2*   No results for input(s): LIPASE, AMYLASE in the last 168 hours. No results for input(s): AMMONIA in the last 168 hours.  CBC: Recent Labs  Lab 08/25/20 1217  08/26/20 0433 08/27/20 0025 08/27/20 1823 08/28/20 0526 08/28/20 1234 08/28/20 2349 08/29/20 0444 08/29/20 1138 08/29/20 2326 2020/09/12 0534  WBC 24.3* 19.2* 18.4*  --  19.9*  --   --  11.6*  --   --  10.5  NEUTROABS 23.0*  --   --   --  18.4*  --   --  10.7*  --   --  9.5*  HGB 7.2* 6.9* 6.5*   < > 7.8*   < > 8.1* 7.8* 7.5* 6.5* 6.5*  HCT 19.4* 18.8* 18.2*   < > 23.0*   < > 23.5* 22.4* 21.5* 19.4* 19.3*  MCV 81.5 83.2 82.7  --  87.1  --   --  86.5  --   --  88.1  PLT 160 164 189  --  199  --   --  201  --   --  194   < > = values in this interval not displayed.    Cardiac Enzymes: No results for input(s): CKTOTAL, CKMB, CKMBINDEX, TROPONINI in the last 168 hours.  BNP: Invalid input(s): POCBNP  CBG: Recent Labs  Lab 08/29/20 1108 08/29/20 1527 08/29/20 1927 08/29/20 2331 2020-09-12 0328  GLUCAP 131* 130* 113* 148* 115*    Microbiology: Results for  orders placed or performed during the hospital encounter of 08/26/2020  Blood Culture (routine x 2)     Status: None   Collection Time: 08/14/2020  9:50 AM   Specimen: BLOOD  Result Value Ref Range Status   Specimen Description BLOOD LEFT [facility]  Final   Special Requests   Final    BOTTLES DRAWN AEROBIC AND ANAEROBIC Blood Culture adequate volume   Culture   Final    NO GROWTH 5 DAYS Performed at [facility], [address] [place], [place] [address]    Report Status 08/18/2020 FINAL  Final  Blood Culture (routine x 2)     Status: None   Collection Time: 08/09/2020  9:50 AM   Specimen: BLOOD  Result Value Ref Range Status   Specimen Description BLOOD RIGHT AC  Final   Special Requests   Final    BOTTLES DRAWN AEROBIC AND ANAEROBIC Blood Culture results may not be optimal due to an excessive volume of blood received in culture bottles   Culture   Final    NO GROWTH 5 DAYS Performed at [facility], [address], [place], [place] [address]    Report Status 08/18/2020 FINAL  Final  Urine culture      Status: None   Collection Time: 08/12/2020 11:36 AM   Specimen: Urine, Random  Result Value Ref Range Status   Specimen Description   Final    URINE, RANDOM Performed at [facility], [address] [place], [place] [address]    Special Requests   Final    NONE Performed at [facility], [address], [place], [place] [address]    Culture   Final    NO GROWTH Performed at [facility], [place] [address] [place], [place] [address]    Report Status 08/14/2020 FINAL  Final  Culture, respiratory     Status: None   Collection Time: 08/17/20  3:15 PM   Specimen: Tracheal Aspirate; Respiratory  Result Value Ref Range Status   Specimen Description   Final    TRACHEAL ASPIRATE Performed at [facility], [address], [place], [place] [address]    Special Requests   Final    NONE Performed at [facility], [place], [place] [address]    Gram Stain NO WBC SEEN MODERATE GRAM VARIABLE ROD   Final   Culture   Final    ABUNDANT SERRATIA MARCESCENS SUSCEPTIBILITIES PERFORMED ON PREVIOUS CULTURE WITHIN THE LAST 5 DAYS. Performed at [facility], [place] [address] [place], [place] [address]    Report Status 08/20/2020 FINAL  Final  Culture, respiratory     Status: None   Collection Time: 08/18/20 11:09 AM   Specimen: Tracheal Aspirate; Respiratory  Result Value Ref Range Status   Specimen Description   Final    TRACHEAL ASPIRATE Performed at [facility], [address] [place], [place] [address]    Special Requests   Final    NONE Performed at [facility], [place], [place] [address]    Gram Stain   Final    MODERATE WBC PRESENT, PREDOMINANTLY PMN MODERATE GRAM NEGATIVE RODS FEW GRAM VARIABLE ROD RARE GRAM POSITIVE COCCI Performed at [facility], [place] [address] [place], [place] [address]    Culture ABUNDANT SERRATIA MARCESCENS  Final   Report Status 08/20/2020 FINAL   Final   Organism ID, Bacteria SERRATIA MARCESCENS  Final  Susceptibility   Serratia marcescens - MIC*    CEFAZOLIN >=64 RESISTANT Resistant     CEFEPIME <=0.12 SENSITIVE Sensitive     CEFTAZIDIME <=1 SENSITIVE Sensitive     CEFTRIAXONE <=0.25 SENSITIVE Sensitive     CIPROFLOXACIN <=0.25 SENSITIVE Sensitive     GENTAMICIN <=1 SENSITIVE Sensitive     TRIMETH/SULFA <=20 SENSITIVE Sensitive     * ABUNDANT SERRATIA MARCESCENS  MRSA PCR Screening     Status: None   Collection Time: 08/18/20 12:52 PM   Specimen: Nasopharyngeal  Result Value Ref Range Status   MRSA by PCR NEGATIVE NEGATIVE Final    Comment:        The GeneXpert MRSA Assay (FDA approved for NASAL specimens only), is one component of a comprehensive MRSA colonization surveillance program. It is not intended to diagnose MRSA infection nor to guide or monitor treatment for MRSA infections. Performed at [facility], [address] [place], [place] [address]   Culture, respiratory (non-expectorated)     Status: None   Collection Time: 08/21/20  4:52 PM   Specimen: Tracheal Aspirate; Respiratory  Result Value Ref Range Status   Specimen Description   Final    TRACHEAL ASPIRATE Performed at [facility], [address] [place], [place] [address]    Special Requests   Final    NONE Performed at [facility], [address] [place], [place] [address]    Gram Stain   Final    FEW WBC PRESENT, PREDOMINANTLY PMN ABUNDANT GRAM VARIABLE ROD MODERATE GRAM POSITIVE COCCI Performed at [facility], [place] [address], [place], [place] [address]    Culture ABUNDANT SERRATIA MARCESCENS  Final   Report Status 08/24/2020 FINAL  Final   Organism ID, Bacteria SERRATIA MARCESCENS  Final      Susceptibility   Serratia marcescens - MIC*    CEFAZOLIN >=64 RESISTANT Resistant     CEFEPIME <=0.12 SENSITIVE Sensitive     CEFTAZIDIME <=1 SENSITIVE Sensitive     CEFTRIAXONE <=0.25 SENSITIVE  Sensitive     CIPROFLOXACIN <=0.25 SENSITIVE Sensitive     GENTAMICIN <=1 SENSITIVE Sensitive     TRIMETH/SULFA <=20 SENSITIVE Sensitive     * ABUNDANT SERRATIA MARCESCENS  CULTURE, BLOOD (ROUTINE X 2) w Reflex to ID Panel     Status: None   Collection Time: 08/22/20  1:37 PM   Specimen: BLOOD  Result Value Ref Range Status   Specimen Description BLOOD BLOOD LEFT HAND  Final   Special Requests   Final    BOTTLES DRAWN AEROBIC AND ANAEROBIC Blood Culture adequate volume   Culture   Final    NO GROWTH 5 DAYS Performed at [facility], [address] [place], [place] [address]    Report Status 08/27/2020 FINAL  Final  CULTURE, BLOOD (ROUTINE X 2) w Reflex to ID Panel     Status: None   Collection Time: 08/22/20  1:37 PM   Specimen: BLOOD  Result Value Ref Range Status   Specimen Description BLOOD LEFT ANTECUBITAL  Final   Special Requests   Final    BOTTLES DRAWN AEROBIC AND ANAEROBIC Blood Culture adequate volume   Culture   Final    NO GROWTH 5 DAYS Performed at [facility], [address] [place], [place] [address]    Report Status 08/27/2020 FINAL  Final    Coagulation Studies: Recent Labs    08/28/20 0526  LABPROT 15.7*  INR 1.3*    Urinalysis:  No results for input(s): COLORURINE, LABSPEC, [place], GLUCOSEU, HGBUR, BILIRUBINUR, KETONESUR, PROTEINUR, UROBILINOGEN, NITRITE, LEUKOCYTESUR in the last 72 hours.  Invalid input(s): APPERANCEUR    Imaging: No results found.   Medications:   . sodium chloride    . calcium gluconate 1,000 mg (09/16/20 0737)  . famotidine (PEPCID) IV 20 mg (08/29/20 1148)  . feeding supplement (NEPRO CARB STEADY) 1,000 mL (08/28/20 1656)  . fentaNYL infusion INTRAVENOUS Stopped (08/25/20 0846)   . sodium chloride   Intravenous Once  . amiodarone  200 mg Per Tube BID  . chlorhexidine gluconate (MEDLINE KIT)  15 mL Mouth Rinse BID  . Chlorhexidine Gluconate Cloth  6 each Topical Daily  . docusate  100 mg Per  Tube BID  . feeding supplement (PROSource TF)  90 mL Per Tube BID  . fiber  1 packet Per Tube BID  . furosemide  80 mg Intravenous Daily  . insulin aspart  0-20 Units Subcutaneous Q4H  . insulin detemir  20 Units Subcutaneous BID  . mouth rinse  15 mL Mouth Rinse 10 times per day  . metoprolol tartrate  12.5 mg Per NG tube BID  . multivitamin  1 tablet Per Tube QHS  . polyethylene glycol  17 g Per Tube Daily  . sodium chloride flush  10-40 mL Intracatheter Q12H  . sodium chloride flush  3 mL Intravenous Q12H   sodium chloride, acetaminophen (TYLENOL) oral liquid 160 mg/5 mL, docusate, fentaNYL, heparin, HYDROmorphone (DILAUDID) injection, metoprolol tartrate, midazolam, sodium chloride flush, sodium chloride flush  Assessment/ Plan:  [name] is a 70 y.o. white male with coronary artery disease, diabetes, glaucoma, history of kidney stones, hyperlipidemia, hypertension, left knee replacement, lithotripsy, basal cell carcinoma, was admitted on 7/67/2094 with Metabolic acidosis [B09.6] Encounter for central line placement [Z45.2] Acute renal failure (ARF) ([place]) [N17.9] Acute respiratory failure with hypoxia ([place]) [J96.01] Sepsis, due to unspecified organism, unspecified whether acute organ dysfunction present ([place]) [A41.9] COVID-19 [U07.1]   #Acute kidney injury Baseline creatinine 1.0 from August 02, 2020 Presenting creatinine of 2.15 Recent Labs       Lab Results  Component Value Date   CREATININE 5.23 (H) 08/24/2020   CREATININE 6.33 (H) 08/23/2020   CREATININE 6.15 (H) 08/23/2020     High dose IV furosemide Hold dialysis until family meeting. Do not think dialysis will change the outcome.   #Acute respiratory failure secondary to COVID-19 pneumonia Requiring intubation and mechanical ventilation.   #Diabetes type 2, insulin dependent: DKA on admission Insulin dependent. Hemoglobin A1c of 7.9% on 06/26/2020  #Secondary Hyperparathyroidism with  hypocalcemia and hyperphosphatemia:  - no binders currently.   # Anemia with kidney failure: PRBC transfusion for today.  - EPO with HD treatments   Overall prognosis is poor.     LOS: 17 Amore Grater 02/13/20228:13 AM

## 2020-09-04 NOTE — Progress Notes (Signed)
Spoke with pts daughter Nathaniel Thomas via telephone regarding pts hgb of 6.5 to determine if she would like to proceed with transfusion of pRBC's due to plans to transition to [place].  Following discussion with pts daughter and spouse Nathaniel Thomas Mrs. Baby would like to proceed with transfusion of pRBC's.  Will continue to monitor and assess pt.  Marda Stalker, [place] Pager [phone] (please enter 7 digits) PCCM Consult Pager [phone] (please enter 7 digits)

## 2020-09-04 DEATH — deceased

## 2020-09-19 LAB — BLOOD GAS, VENOUS
Patient temperature: 37
pCO2, Ven: 22 mmHg — ABNORMAL LOW (ref 44.0–60.0)
pH, Ven: <6.9 — CL (ref 7.250–7.430)
pO2, Ven: 48 mmHg — ABNORMAL HIGH (ref 32.0–45.0)

## 2020-10-02 NOTE — Discharge Summary (Signed)
Physician Discharge Summary  Patient ID: Nathaniel Thomas MRN: [id number] DOB/AGE: [birth year] 71 y.o.  Admit date: 08/24/2020 Discharge date: 09/26/2020  Admission Diagnoses:  Discharge Diagnoses:  Active Problems:   COVID-19   Malnutrition of moderate degree   Acute renal failure (ARF) (HCC)   Acute respiratory failure with hypoxia (HCC)   Sepsis (HCC)   Metabolic acidosis   Endotracheal tube present   Palliative care by specialist   Goals of care, counseling/discussion   Discharged Condition: deceased  Hospital Course:  69yo admitted to the hospital after being found unresponsive and hypothermic. He had a COVID positive screen the week prior.  He failed supplemental oxygen sources and was intubated.   1/10 admitted to ICU, severe DKA, gastroenteritis, COVID 19 infection causing DKA and gastroenteritis 1/11 severe resp failure 1/12 severe DKA 1/13 severe resp failure, worsening Renal failure 1/14- Reviewed care plan with cardiologist Dr Ubaldo Glassing, plan for cardiac cardoversion. Patient remains in shock with AFRVR on MV, medically optimizing for procedure.  I met with daughter Nathaniel Thomas.   1/15- patient improved slightly, now with rate controlled AF off Neo. Discussed case with cardiology. +ISCHEMIC CARDIOMYOPATHY 08/18/20 - Serratia Marcescens  08/19/20- vitals are improved, rate controlled.  BP normalized.  Ventilator weaned to FiO2 60% remains crtically ill.   08/23/20 - vent weaned to 40% FiO2 and PEEP 5 08/25/20- patient remains critically ill, CBC with critially low Hb will repeat specimen and transfuse if needed today. Continuing renal replacement with severe electrolyte derragements and oliguric renal failure. Have updated POA Nathaniel Thomas today.  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 1/25 severe resp failure, severe encephalopathy  1/27 Family withdrew for compassionate extubation   On 17-Sep-2020 the patient remained hypoxic and without a good prognosis. The family decided he would not wish to continue with mechanical ventilation and decided it best to make him comfortable.  Discharge Exam: deceased  Disposition: per family wishes  Signed: Nelle Don 09/26/2020, 8:13 AM
